# Patient Record
Sex: Male | Born: 1940
Health system: Southern US, Community
[De-identification: ages and names within clinical notes are randomized; demographics above are authoritative.]

## PROBLEM LIST (undated history)

## (undated) DIAGNOSIS — F79 Unspecified intellectual disabilities: Secondary | ICD-10-CM

## (undated) DIAGNOSIS — E669 Obesity, unspecified: Secondary | ICD-10-CM

## (undated) DIAGNOSIS — G40909 Epilepsy, unspecified, not intractable, without status epilepticus: Secondary | ICD-10-CM

## (undated) DIAGNOSIS — I10 Essential (primary) hypertension: Secondary | ICD-10-CM

## (undated) DIAGNOSIS — F39 Unspecified mood [affective] disorder: Secondary | ICD-10-CM

## (undated) DIAGNOSIS — F71 Moderate intellectual disabilities: Secondary | ICD-10-CM

## (undated) DIAGNOSIS — N189 Chronic kidney disease, unspecified: Secondary | ICD-10-CM

## (undated) DIAGNOSIS — G20C Parkinsonism, unspecified: Secondary | ICD-10-CM

## (undated) DIAGNOSIS — E7800 Pure hypercholesterolemia, unspecified: Secondary | ICD-10-CM

## (undated) HISTORY — DX: Chronic kidney disease, unspecified: N18.9

## (undated) HISTORY — DX: Parkinsonism, unspecified: G20.C

## (undated) HISTORY — DX: Moderate intellectual disabilities: F71

## (undated) HISTORY — DX: Epilepsy, unspecified, not intractable, without status epilepticus: G40.909

---

## 2000-10-22 ENCOUNTER — Emergency Department (HOSPITAL_COMMUNITY): Admission: EM | Admit: 2000-10-22 | Discharge: 2000-10-22 | Payer: Self-pay | Admitting: Emergency Medicine

## 2001-02-10 ENCOUNTER — Emergency Department (HOSPITAL_COMMUNITY): Admission: EM | Admit: 2001-02-10 | Discharge: 2001-02-10 | Payer: Self-pay | Admitting: Internal Medicine

## 2009-02-11 ENCOUNTER — Emergency Department (HOSPITAL_COMMUNITY): Admission: EM | Admit: 2009-02-11 | Discharge: 2009-02-11 | Payer: Self-pay | Admitting: Emergency Medicine

## 2010-07-24 LAB — CBC
MCV: 89.7 fL (ref 78.0–100.0)
RBC: 5.14 MIL/uL (ref 4.22–5.81)
WBC: 12.7 10*3/uL — ABNORMAL HIGH (ref 4.0–10.5)

## 2010-07-24 LAB — BASIC METABOLIC PANEL
Chloride: 104 mEq/L (ref 96–112)
Creatinine, Ser: 1.51 mg/dL — ABNORMAL HIGH (ref 0.4–1.5)
GFR calc Af Amer: 56 mL/min — ABNORMAL LOW (ref >60)
Potassium: 3.7 mEq/L (ref 3.5–5.1)
Sodium: 135 mEq/L (ref 135–145)

## 2010-07-24 LAB — DIFFERENTIAL
Eosinophils Absolute: 0 10*3/uL (ref 0.0–0.7)
Lymphs Abs: 1.3 10*3/uL (ref 0.7–4.0)
Monocytes Relative: 5 % (ref 3–12)
Neutrophils Relative %: 85 % — ABNORMAL HIGH (ref 43–77)

## 2011-07-17 ENCOUNTER — Emergency Department (HOSPITAL_COMMUNITY): Payer: Medicare Other

## 2011-07-17 ENCOUNTER — Emergency Department (HOSPITAL_COMMUNITY)
Admission: EM | Admit: 2011-07-17 | Discharge: 2011-07-17 | Disposition: A | Discharge status: Home / Self Care | Payer: Medicare Other | Attending: Emergency Medicine | Admitting: Emergency Medicine

## 2011-07-17 ENCOUNTER — Encounter (HOSPITAL_COMMUNITY): Payer: Self-pay | Admitting: *Deleted

## 2011-07-17 DIAGNOSIS — F17200 Nicotine dependence, unspecified, uncomplicated: Secondary | ICD-10-CM | POA: Insufficient documentation

## 2011-07-17 DIAGNOSIS — I10 Essential (primary) hypertension: Secondary | ICD-10-CM | POA: Insufficient documentation

## 2011-07-17 DIAGNOSIS — Y92009 Unspecified place in unspecified non-institutional (private) residence as the place of occurrence of the external cause: Secondary | ICD-10-CM | POA: Insufficient documentation

## 2011-07-17 DIAGNOSIS — R609 Edema, unspecified: Secondary | ICD-10-CM | POA: Insufficient documentation

## 2011-07-17 DIAGNOSIS — M79609 Pain in unspecified limb: Secondary | ICD-10-CM | POA: Insufficient documentation

## 2011-07-17 DIAGNOSIS — L03116 Cellulitis of left lower limb: Secondary | ICD-10-CM

## 2011-07-17 DIAGNOSIS — F79 Unspecified intellectual disabilities: Secondary | ICD-10-CM | POA: Insufficient documentation

## 2011-07-17 DIAGNOSIS — W19XXXA Unspecified fall, initial encounter: Secondary | ICD-10-CM | POA: Insufficient documentation

## 2011-07-17 DIAGNOSIS — L03119 Cellulitis of unspecified part of limb: Secondary | ICD-10-CM | POA: Insufficient documentation

## 2011-07-17 DIAGNOSIS — L02619 Cutaneous abscess of unspecified foot: Secondary | ICD-10-CM | POA: Insufficient documentation

## 2011-07-17 HISTORY — DX: Unspecified mood (affective) disorder: F39

## 2011-07-17 HISTORY — DX: Obesity, unspecified: E66.9

## 2011-07-17 HISTORY — DX: Pure hypercholesterolemia, unspecified: E78.00

## 2011-07-17 HISTORY — DX: Essential (primary) hypertension: I10

## 2011-07-17 HISTORY — DX: Unspecified intellectual disabilities: F79

## 2011-07-17 MED ORDER — SULFAMETHOXAZOLE-TMP DS 800-160 MG PO TABS
1.0000 | ORAL_TABLET | Freq: Once | ORAL | Status: AC
  Administered 2011-07-17: 1 via ORAL
  Filled 2011-07-17: qty 1

## 2011-07-17 MED ORDER — NAPROXEN 250 MG PO TABS
500.0000 mg | ORAL_TABLET | Freq: Once | ORAL | Status: AC
  Administered 2011-07-17: 500 mg via ORAL
  Filled 2011-07-17: qty 2

## 2011-07-17 MED ORDER — NAPROXEN 500 MG PO TABS: 500.0000 mg | ORAL_TABLET | Freq: Two times a day (BID) | ORAL | Status: AC

## 2011-07-17 MED ORDER — SULFAMETHOXAZOLE-TRIMETHOPRIM 800-160 MG PO TABS: 1.0000 | ORAL_TABLET | Freq: Two times a day (BID) | ORAL | Status: AC

## 2011-07-17 NOTE — Discharge Instructions (Signed)
Elevate foot. Keep clean. Antibiotic and pain medicine. Followup your primary care Dr.

## 2011-07-17 NOTE — ED Notes (Signed)
Swelling and redness to left foot first noticed this morning.

## 2011-07-17 NOTE — ED Provider Notes (Signed)
This chart was scribed for Donnetta Hutching, MD by Williemae Natter. The patient was seen in room [facility] at 11:05 AM.  CSN: [id number]  Arrival date & time 07/17/11  4098   First MD Initiated Contact with Patient 07/17/11 1020      Chief Complaint  Patient presents with  . Leg Swelling    (Consider location/radiation/quality/duration/timing/severity/associated sxs/prior treatment) HPI Level 5 Caveat due to pt's mental redardation Michael Mercado is a 71 y.o. male who presents to the Emergency Department complaining of constant acute onset mild foot pain on the left foot since yesterday. Pt reports that left foot is  sore. Pt's wife reports that he fell and hit his head yesterday while taking out the trash. She suspects that his foot was injured in the fall.  Past Medical History  Diagnosis Date  . MR (mental retardation)   . Mood disorder   . Hypercholesteremia   . Hypertension   . Obesity     History reviewed. No pertinent past surgical history.  No family history on file.  History  Substance Use Topics  . Smoking status: Current Everyday Smoker  . Smokeless tobacco: Not on file  . Alcohol Use: No      Review of Systems  Unable to perform ROS: Other    Allergies  Review of patient's allergies indicates no known allergies.  Home Medications  No current outpatient prescriptions on file.  BP 139/82  Pulse 73  Temp 98.4 F (36.9 C)  Resp 16  SpO2 97%  Physical Exam  Nursing note and vitals reviewed. Constitutional: He appears well-developed and well-nourished.  HENT:  Head: Normocephalic and atraumatic.  Neck: Normal range of motion. Neck supple.  Cardiovascular: Normal rate.   Pulmonary/Chest: Effort normal. No respiratory distress.  Abdominal: There is no tenderness.  Musculoskeletal: He exhibits edema and tenderness.       left foot erythematous over 3rd 4th and 5th MTP area  Neurological: He is alert. He exhibits normal muscle tone.  Skin: Skin  is warm and dry.    ED Course  Procedures (including critical care time) DIAGNOSTIC STUDIES: Oxygen Saturation is 97% on room air, normal by my terpretation.   Dg Foot Complete Left  07/17/2011  *RADIOLOGY REPORT*  Clinical Data: Left foot pain, swelling, erythema.  LEFT FOOT - COMPLETE 3+ VIEW  Comparison: None.  Findings: No evidence of fracture or dislocation.  No evidence of arthropathy.  No other bone lesions identified.  Tiny plantar and dorsal calcaneal spurs are noted.  Soft tissues are unremarkable.  IMPRESSION:  1.  No acute findings. 2.  Tiny dorsal and plantar calcaneal spurs incidentally noted.  Original Report Authenticated By: Danae Orleans, M.D.   COORDINATION OF CARE: Medications - No data to display   Suspect minor skin infection but also possibility of gout. Ordering X-ray of left foot and treating for both possibilities. Discussed treatment plan with pt and family. Putting pt on an antibiotic with an antiinflammatory agent.  Labs Reviewed - No data to display No results found.   No diagnosis found.    MDM    I personally performed the services described in this documentation, which was scribed in my presence. The recorded information has been reviewed and considered.   X-ray of foot negative.  Skin exam shows cellulitis on dorsum of foot. Additionally clinical scenario could be consistent with gout secondary to involvement of first MTP joint. Will Rx Septra and Naprosyn.     Donnetta Hutching, MD 07/17/11  1205 

## 2011-07-17 NOTE — ED Notes (Signed)
Patient with no complaints at this time. Respirations even and unlabored. Skin warm/dry. Discharge instructions reviewed with patient at this time. Patient given opportunity to voice concerns/ask questions. Patient discharged at this time and left Emergency Department with steady gait.   

## 2011-07-17 NOTE — ED Notes (Signed)
Dr Cook at bedside

## 2011-07-17 NOTE — ED Notes (Signed)
Patient ambulatory to restroom with steady gait.

## 2011-07-17 NOTE — ED Notes (Signed)
Patient from [facility]. Caregiver from home at bedside.

## 2014-05-31 DIAGNOSIS — F319 Bipolar disorder, unspecified: Secondary | ICD-10-CM | POA: Diagnosis not present

## 2014-05-31 DIAGNOSIS — F71 Moderate intellectual disabilities: Secondary | ICD-10-CM | POA: Diagnosis not present

## 2014-06-28 DIAGNOSIS — F319 Bipolar disorder, unspecified: Secondary | ICD-10-CM | POA: Diagnosis not present

## 2014-06-28 DIAGNOSIS — F71 Moderate intellectual disabilities: Secondary | ICD-10-CM | POA: Diagnosis not present

## 2014-07-04 DIAGNOSIS — Z79899 Other long term (current) drug therapy: Secondary | ICD-10-CM | POA: Diagnosis not present

## 2014-07-19 DIAGNOSIS — W19XXXA Unspecified fall, initial encounter: Secondary | ICD-10-CM | POA: Diagnosis not present

## 2014-07-19 DIAGNOSIS — M85641 Other cyst of bone, right hand: Secondary | ICD-10-CM | POA: Diagnosis not present

## 2014-07-19 DIAGNOSIS — S0181XA Laceration without foreign body of other part of head, initial encounter: Secondary | ICD-10-CM | POA: Diagnosis not present

## 2014-07-19 DIAGNOSIS — M5032 Other cervical disc degeneration, mid-cervical region: Secondary | ICD-10-CM | POA: Diagnosis not present

## 2014-07-19 DIAGNOSIS — M19022 Primary osteoarthritis, left elbow: Secondary | ICD-10-CM | POA: Diagnosis not present

## 2014-07-19 DIAGNOSIS — S51012A Laceration without foreign body of left elbow, initial encounter: Secondary | ICD-10-CM | POA: Diagnosis not present

## 2014-07-19 DIAGNOSIS — S0990XA Unspecified injury of head, initial encounter: Secondary | ICD-10-CM | POA: Diagnosis not present

## 2014-07-19 DIAGNOSIS — S60511A Abrasion of right hand, initial encounter: Secondary | ICD-10-CM | POA: Diagnosis not present

## 2014-07-19 DIAGNOSIS — S199XXA Unspecified injury of neck, initial encounter: Secondary | ICD-10-CM | POA: Diagnosis not present

## 2014-07-19 DIAGNOSIS — S50311A Abrasion of right elbow, initial encounter: Secondary | ICD-10-CM | POA: Diagnosis not present

## 2014-07-19 DIAGNOSIS — S0001XA Abrasion of scalp, initial encounter: Secondary | ICD-10-CM | POA: Diagnosis not present

## 2014-07-19 DIAGNOSIS — G319 Degenerative disease of nervous system, unspecified: Secondary | ICD-10-CM | POA: Diagnosis not present

## 2014-07-19 DIAGNOSIS — Z720 Tobacco use: Secondary | ICD-10-CM | POA: Diagnosis not present

## 2014-07-19 DIAGNOSIS — S61411A Laceration without foreign body of right hand, initial encounter: Secondary | ICD-10-CM | POA: Diagnosis not present

## 2014-07-19 DIAGNOSIS — I10 Essential (primary) hypertension: Secondary | ICD-10-CM | POA: Diagnosis not present

## 2014-07-19 DIAGNOSIS — S60512A Abrasion of left hand, initial encounter: Secondary | ICD-10-CM | POA: Diagnosis not present

## 2014-08-28 DIAGNOSIS — R7309 Other abnormal glucose: Secondary | ICD-10-CM | POA: Diagnosis not present

## 2014-08-28 DIAGNOSIS — E782 Mixed hyperlipidemia: Secondary | ICD-10-CM | POA: Diagnosis not present

## 2014-08-28 DIAGNOSIS — E780 Pure hypercholesterolemia: Secondary | ICD-10-CM | POA: Diagnosis not present

## 2014-08-28 DIAGNOSIS — Z833 Family history of diabetes mellitus: Secondary | ICD-10-CM | POA: Diagnosis not present

## 2014-08-28 DIAGNOSIS — R531 Weakness: Secondary | ICD-10-CM | POA: Diagnosis not present

## 2014-08-28 DIAGNOSIS — R5381 Other malaise: Secondary | ICD-10-CM | POA: Diagnosis not present

## 2014-08-28 DIAGNOSIS — Z79899 Other long term (current) drug therapy: Secondary | ICD-10-CM | POA: Diagnosis not present

## 2014-08-28 DIAGNOSIS — J449 Chronic obstructive pulmonary disease, unspecified: Secondary | ICD-10-CM | POA: Diagnosis not present

## 2014-08-28 DIAGNOSIS — I10 Essential (primary) hypertension: Secondary | ICD-10-CM | POA: Diagnosis not present

## 2014-08-29 DIAGNOSIS — F319 Bipolar disorder, unspecified: Secondary | ICD-10-CM | POA: Diagnosis not present

## 2014-08-29 DIAGNOSIS — F71 Moderate intellectual disabilities: Secondary | ICD-10-CM | POA: Diagnosis not present

## 2014-10-01 DIAGNOSIS — F319 Bipolar disorder, unspecified: Secondary | ICD-10-CM | POA: Diagnosis not present

## 2014-10-31 DIAGNOSIS — F70 Mild intellectual disabilities: Secondary | ICD-10-CM | POA: Diagnosis not present

## 2014-10-31 DIAGNOSIS — F319 Bipolar disorder, unspecified: Secondary | ICD-10-CM | POA: Diagnosis not present

## 2014-11-07 DIAGNOSIS — H40033 Anatomical narrow angle, bilateral: Secondary | ICD-10-CM | POA: Diagnosis not present

## 2014-11-07 DIAGNOSIS — H2513 Age-related nuclear cataract, bilateral: Secondary | ICD-10-CM | POA: Diagnosis not present

## 2014-11-27 DIAGNOSIS — Z79899 Other long term (current) drug therapy: Secondary | ICD-10-CM | POA: Diagnosis not present

## 2014-11-27 DIAGNOSIS — J449 Chronic obstructive pulmonary disease, unspecified: Secondary | ICD-10-CM | POA: Diagnosis not present

## 2014-12-03 DIAGNOSIS — F319 Bipolar disorder, unspecified: Secondary | ICD-10-CM | POA: Diagnosis not present

## 2014-12-10 DIAGNOSIS — F319 Bipolar disorder, unspecified: Secondary | ICD-10-CM | POA: Diagnosis not present

## 2014-12-31 DIAGNOSIS — F319 Bipolar disorder, unspecified: Secondary | ICD-10-CM | POA: Diagnosis not present

## 2015-01-24 DIAGNOSIS — F319 Bipolar disorder, unspecified: Secondary | ICD-10-CM | POA: Diagnosis not present

## 2015-01-28 DIAGNOSIS — Z23 Encounter for immunization: Secondary | ICD-10-CM | POA: Diagnosis not present

## 2015-01-29 DIAGNOSIS — J449 Chronic obstructive pulmonary disease, unspecified: Secondary | ICD-10-CM | POA: Diagnosis not present

## 2015-01-29 DIAGNOSIS — I10 Essential (primary) hypertension: Secondary | ICD-10-CM | POA: Diagnosis not present

## 2015-01-29 DIAGNOSIS — R531 Weakness: Secondary | ICD-10-CM | POA: Diagnosis not present

## 2015-02-27 DIAGNOSIS — F319 Bipolar disorder, unspecified: Secondary | ICD-10-CM | POA: Diagnosis not present

## 2015-02-27 DIAGNOSIS — F70 Mild intellectual disabilities: Secondary | ICD-10-CM | POA: Diagnosis not present

## 2015-03-18 ENCOUNTER — Ambulatory Visit (INDEPENDENT_AMBULATORY_CARE_PROVIDER_SITE_OTHER): Payer: Medicare Other | Admitting: Family Medicine

## 2015-03-18 ENCOUNTER — Encounter: Payer: Self-pay | Admitting: Family Medicine

## 2015-03-18 VITALS — BP 117/76 | HR 66 | Temp 96.7°F | Ht 63.0 in | Wt 188.6 lb

## 2015-03-18 DIAGNOSIS — I10 Essential (primary) hypertension: Secondary | ICD-10-CM | POA: Diagnosis not present

## 2015-03-18 DIAGNOSIS — F79 Unspecified intellectual disabilities: Secondary | ICD-10-CM | POA: Insufficient documentation

## 2015-03-18 DIAGNOSIS — R238 Other skin changes: Secondary | ICD-10-CM | POA: Diagnosis not present

## 2015-03-18 DIAGNOSIS — E785 Hyperlipidemia, unspecified: Secondary | ICD-10-CM | POA: Diagnosis not present

## 2015-03-18 DIAGNOSIS — J449 Chronic obstructive pulmonary disease, unspecified: Secondary | ICD-10-CM

## 2015-03-18 DIAGNOSIS — IMO0001 Reserved for inherently not codable concepts without codable children: Secondary | ICD-10-CM

## 2015-03-18 DIAGNOSIS — F209 Schizophrenia, unspecified: Secondary | ICD-10-CM | POA: Insufficient documentation

## 2015-03-18 DIAGNOSIS — R233 Spontaneous ecchymoses: Secondary | ICD-10-CM

## 2015-03-18 MED ORDER — ALBUTEROL SULFATE HFA 108 (90 BASE) MCG/ACT IN AERS: 2.0000 | INHALATION_SPRAY | Freq: Four times a day (QID) | RESPIRATORY_TRACT | Status: DC | PRN

## 2015-03-18 NOTE — Progress Notes (Signed)
BP 117/76 mmHg  Pulse 66  Temp(Src) 96.7 F (35.9 C) (Oral)  Ht 5' 3"  (1.6 m)  Wt 188 lb 9.6 oz (85.548 kg)  BMI 33.42 kg/m2   Subjective:    Patient ID: Michael Mercado, male    DOB: [birth year], 74 y.o.   MRN: [id number]  HPI: Michael Mercado is a 74 y.o. male presenting on 03/18/2015 for Establish Care   HPI Hypertension  Patient has had hypertension for at least a few years and is currently taking Norvasc 5 mg daily for this. Patient denies headaches, blurred vision, chest pains, shortness of breath, or weakness. Denies any side effects from medication and is content with current medication. He comes in today to establish care and his caretakers come in with him as well today. She is caretaker because of his mental retardation and severe schizophrenia.  Hyperlipidemia Patient carries the diagnosis of high cholesterol from a previous physician. He is currently on gemfibrozil for this. Denies any issues with the medication and is due for recheck.  COPD Patient has been diagnosed with COPD because of a smoking history. He currently has an albuterol inhaler but has it listed as scheduled taking it 4 times daily 2 puffs each time. They do not actually know how often he has taken based on stress or anything else at this point. They've not noticed any coughing spells or wheezing or difficulty breathing.  Easy bruising Patient has some bruising on anterior forearms and dorsal side of his wrists and hands. Her explanation he tends to pick at his skin and a lot of in those areas often and even had scarring on his forearm as a result of that.  Relevant past medical, surgical, family and social history reviewed and updated as indicated. Interim medical history since our last visit reviewed. Allergies and medications reviewed and updated.  Review of Systems  Constitutional: Negative for fever.  HENT: Negative for ear discharge and ear pain.   Eyes: Negative for discharge and visual  disturbance.  Respiratory: Negative for shortness of breath and wheezing.   Cardiovascular: Negative for chest pain and leg swelling.  Gastrointestinal: Negative for abdominal pain, diarrhea and constipation.  Genitourinary: Negative for difficulty urinating.  Musculoskeletal: Negative for back pain and gait problem.  Skin: Positive for color change (some bruising in various stages of healing on forearms). Negative for rash.  Neurological: Negative for syncope, light-headedness and headaches.  All other systems reviewed and are negative.   Per HPI unless specifically indicated above  Social History   Social History  . Marital Status: Single    Spouse Name: N/A  . Number of Children: N/A  . Years of Education: N/A   Occupational History  . Not on file.   Social History Main Topics  . Smoking status: Current Every Day Smoker -- 0.33 packs/day    Types: Cigarettes  . Smokeless tobacco: Not on file  . Alcohol Use: No  . Drug Use: Not on file  . Sexual Activity: Not on file   Other Topics Concern  . Not on file   Social History Narrative    History reviewed. No pertinent past surgical history.  Family History  Problem Relation Age of Onset  . Stroke Sister       Medication List       This list is accurate as of: 03/18/15 11:17 AM.  Always use your most recent med list.  acetaminophen 500 MG tablet  Commonly known as:  TYLENOL  Take 500 mg by mouth every 4 (four) hours as needed.     AFTER BITE EX  Apply topically as needed.     albuterol 108 (90 BASE) MCG/ACT inhaler  Commonly known as:  PROVENTIL HFA;VENTOLIN HFA  Inhale 2 puffs into the lungs every 6 (six) hours as needed for wheezing or shortness of breath.     alum & mag hydroxide-simeth 200-200-20 MG/5ML suspension  Commonly known as:  MAALOX/MYLANTA  Take 10 mLs by mouth every 8 (eight) hours as needed for indigestion or heartburn.     amLODipine 5 MG tablet  Commonly known as:   NORVASC  Take 5 mg by mouth daily.     aspirin EC 81 MG tablet  Take 81 mg by mouth daily.     clonazePAM 0.5 MG tablet  Commonly known as:  KLONOPIN  Take 0.5 mg by mouth 2 (two) times daily as needed for anxiety.     COPPERTONE SPORT SPF30 EX  Apply topically.     diphenhydrAMINE 25 mg capsule  Commonly known as:  BENADRYL  Take 25 mg by mouth every 6 (six) hours as needed.     gemfibrozil 600 MG tablet  Commonly known as:  LOPID  Take 600 mg by mouth 2 (two) times daily.     hydrocortisone cream 1 %  Apply 1 application topically 3 (three) times daily. As needed     ibuprofen 400 MG tablet  Commonly known as:  ADVIL,MOTRIN  Take 400 mg by mouth every 8 (eight) hours as needed.     LIP BALM BASE EX  Apply topically.     loperamide 2 MG capsule  Commonly known as:  IMODIUM  Take by mouth as needed for diarrhea or loose stools.     LUBRICATING LOTION EX  Apply topically 2 (two) times daily.     magnesium hydroxide 400 MG/5ML suspension  Commonly known as:  MILK OF MAGNESIA  Take 30 mLs by mouth daily as needed for mild constipation.     PARoxetine 20 MG tablet  Commonly known as:  PAXIL  Take 20 mg by mouth daily. Take at bedtime     permethrin 5 % cream  Commonly known as:  ELIMITE  Apply 1 application topically once. As needed     risperiDONE 1 MG tablet  Commonly known as:  RISPERDAL  Take 1 mg by mouth 2 (two) times daily.           Objective:    BP 117/76 mmHg  Pulse 66  Temp(Src) 96.7 F (35.9 C) (Oral)  Ht 5' 3"  (1.6 m)  Wt 188 lb 9.6 oz (85.548 kg)  BMI 33.42 kg/m2  Wt Readings from Last 3 Encounters:  03/18/15 188 lb 9.6 oz (85.548 kg)    Physical Exam  Constitutional: He appears well-developed and well-nourished. No distress.  HENT:  Right Ear: External ear normal.  Left Ear: External ear normal.  Nose: Nose normal.  Mouth/Throat: Oropharynx is clear and moist. No oropharyngeal exudate.  Eyes: Conjunctivae and EOM are normal.  Pupils are equal, round, and reactive to light. Right eye exhibits no discharge. No scleral icterus.  Neck: Neck supple. No thyromegaly present.  Cardiovascular: Normal rate, regular rhythm, normal heart sounds and intact distal pulses.   No murmur heard. Pulmonary/Chest: Effort normal and breath sounds normal. No respiratory distress. He has no wheezes. He has no rales.  Abdominal: Soft. Bowel sounds are  normal. He exhibits no distension. There is no tenderness. There is no rebound and no guarding.  Musculoskeletal: Normal range of motion. He exhibits no edema or tenderness.  Lymphadenopathy:    He has no cervical adenopathy.  Neurological: He is alert. No cranial nerve deficit. Coordination normal.  Skin: Skin is warm and dry. Bruising (Anterior forearms and dorsal wristsand hands) noted. No rash noted. He is not diaphoretic.  Psychiatric: His speech is normal and behavior is normal. Thought content normal. His affect is labile. He expresses impulsivity. He does not express inappropriate judgment. He expresses no suicidal ideation. He expresses no suicidal plans.  Vitals reviewed.   Results for orders placed or performed during the hospital encounter of 07/08/20  Basic metabolic panel  Result Value Ref Range   Sodium 135 135 - 145 mEq/L   Potassium 3.7 3.5 - 5.1 mEq/L   Chloride 104 96 - 112 mEq/L   CO2 21 19 - 32 mEq/L   Glucose, Bld 123 (H) 70 - 99 mg/dL   BUN 23 6 - 23 mg/dL   Creatinine, Ser 1.51 (H) 0.4 - 1.5 mg/dL   Calcium 9.5 8.4 - 10.5 mg/dL   GFR calc non Af Amer 46 (L) >60 mL/min   GFR calc Af Amer (L) >60 mL/min    56        The eGFR has been calculated using the MDRD equation. This calculation has not been validated in all clinical situations. eGFR's persistently <60 mL/min signify possible Chronic Kidney Disease.  CBC  Result Value Ref Range   WBC 12.7 (H) 4.0 - 10.5 K/uL   RBC 5.14 4.22 - 5.81 MIL/uL   Hemoglobin 15.9 13.0 - 17.0 g/dL   HCT 46.1 39.0 - 52.0  %   MCV 89.7 78.0 - 100.0 fL   MCHC 34.5 30.0 - 36.0 g/dL   RDW 13.7 11.5 - 15.5 %   Platelets 183 150 - 400 K/uL  Differential  Result Value Ref Range   Neutrophils Relative % 85 (H) 43 - 77 %   Neutro Abs 10.8 (H) 1.7 - 7.7 K/uL   Lymphocytes Relative 10 (L) 12 - 46 %   Lymphs Abs 1.3 0.7 - 4.0 K/uL   Monocytes Relative 5 3 - 12 %   Monocytes Absolute 0.6 0.1 - 1.0 K/uL   Eosinophils Relative 0 0 - 5 %   Eosinophils Absolute 0.0 0.0 - 0.7 K/uL   Basophils Relative 0 0 - 1 %   Basophils Absolute 0.0 0.0 - 0.1 K/uL      Assessment & Plan:   Problem List Items Addressed This Visit      Cardiovascular and Mediastinum   HTN (hypertension), benign - Primary    Currently on Norvasc, blood pressure controlled today. Continue medication and check labs      Relevant Medications   amLODipine (NORVASC) 5 MG tablet   Other Relevant Orders   CMP14+EGFR   TSH     Respiratory   COPD bronchitis    Using daily albuterol, because it has been scheduled, will change to when necessary. We will then assess how often he needs it and see whether he needs a maintenance inhaler after that point.      Relevant Medications   diphenhydrAMINE (BENADRYL) 25 mg capsule   albuterol (PROVENTIL HFA;VENTOLIN HFA) 108 (90 BASE) MCG/ACT inhaler     Other   Hyperlipidemia LDL goal <130    Currently on gemfibrozil, we'll check labs and consider whether we need  to change that.      Relevant Medications   amLODipine (NORVASC) 5 MG tablet   Other Relevant Orders   Lipid panel    Other Visit Diagnoses    Easy bruising        He has easy bruising on arms and hands but he also picks at them a lot, will check CBC and platelets    Relevant Orders    CBC with Differential/Platelet        Follow up plan: Return in about 3 months (around 06/18/2015), or if symptoms worsen or fail to improve, for f/u COPD and HTN.  Caryl Pina, MD [facility] 03/18/2015, 11:17  AM

## 2015-03-18 NOTE — Assessment & Plan Note (Signed)
Currently on gemfibrozil, we'll check labs and consider whether we need to change that.

## 2015-03-18 NOTE — Assessment & Plan Note (Signed)
Currently on Norvasc, blood pressure controlled today. Continue medication and check labs

## 2015-03-18 NOTE — Assessment & Plan Note (Signed)
Using daily albuterol, because it has been scheduled, will change to when necessary. We will then assess how often he needs it and see whether he needs a maintenance inhaler after that point.

## 2015-03-19 LAB — LIPID PANEL
CHOL/HDL RATIO: 3.3 ratio (ref 0.0–5.0)
Cholesterol, Total: 147 mg/dL (ref 100–199)
HDL: 45 mg/dL (ref >39)
LDL Calculated: 91 mg/dL (ref 0–99)
Triglycerides: 57 mg/dL (ref 0–149)
VLDL Cholesterol Cal: 11 mg/dL (ref 5–40)

## 2015-03-19 LAB — CMP14+EGFR
A/G RATIO: 1.8 (ref 1.1–2.5)
ALT: 16 IU/L (ref 0–44)
AST: 21 IU/L (ref 0–40)
Albumin: 4.4 g/dL (ref 3.5–4.8)
Alkaline Phosphatase: 97 IU/L (ref 39–117)
BUN/Creatinine Ratio: 17 (ref 10–22)
BUN: 23 mg/dL (ref 8–27)
Bilirubin Total: 0.4 mg/dL (ref 0.0–1.2)
CALCIUM: 9.6 mg/dL (ref 8.6–10.2)
CO2: 17 mmol/L — ABNORMAL LOW (ref 18–29)
Chloride: 102 mmol/L (ref 97–106)
Creatinine, Ser: 1.36 mg/dL — ABNORMAL HIGH (ref 0.76–1.27)
GFR, EST AFRICAN AMERICAN: 60 mL/min/{1.73_m2} (ref >59)
GFR, EST NON AFRICAN AMERICAN: 52 mL/min/{1.73_m2} — AB (ref >59)
GLOBULIN, TOTAL: 2.4 g/dL (ref 1.5–4.5)
Glucose: 91 mg/dL (ref 65–99)
POTASSIUM: 4.9 mmol/L (ref 3.5–5.2)
SODIUM: 138 mmol/L (ref 136–144)
TOTAL PROTEIN: 6.8 g/dL (ref 6.0–8.5)

## 2015-03-19 LAB — CBC WITH DIFFERENTIAL/PLATELET
BASOS ABS: 0.1 10*3/uL (ref 0.0–0.2)
Basos: 1 %
EOS (ABSOLUTE): 0.3 10*3/uL (ref 0.0–0.4)
Eos: 5 %
Hematocrit: 44.9 % (ref 37.5–51.0)
Hemoglobin: 15.5 g/dL (ref 12.6–17.7)
IMMATURE GRANS (ABS): 0 10*3/uL (ref 0.0–0.1)
IMMATURE GRANULOCYTES: 0 %
LYMPHS: 26 %
Lymphocytes Absolute: 1.7 10*3/uL (ref 0.7–3.1)
MCH: 30.3 pg (ref 26.6–33.0)
MCHC: 34.5 g/dL (ref 31.5–35.7)
MCV: 88 fL (ref 79–97)
Monocytes Absolute: 0.5 10*3/uL (ref 0.1–0.9)
Monocytes: 8 %
NEUTROS PCT: 60 %
Neutrophils Absolute: 3.9 10*3/uL (ref 1.4–7.0)
PLATELETS: 243 10*3/uL (ref 150–379)
RBC: 5.12 x10E6/uL (ref 4.14–5.80)
RDW: 14.4 % (ref 12.3–15.4)
WBC: 6.5 10*3/uL (ref 3.4–10.8)

## 2015-03-19 LAB — TSH: TSH: 2.37 u[IU]/mL (ref 0.450–4.500)

## 2015-05-03 ENCOUNTER — Telehealth: Payer: Self-pay | Admitting: Family Medicine

## 2015-05-03 NOTE — Telephone Encounter (Signed)
This call is from [facility] - pt in a group home..  Please address

## 2015-05-03 NOTE — Telephone Encounter (Signed)
I just got a paper for that and signed it today and sent it back to [facility], but you're okay to call one in if we need to

## 2015-05-03 NOTE — Telephone Encounter (Signed)
I faxed form and recalled [facility] and they received fax.

## 2015-05-16 ENCOUNTER — Other Ambulatory Visit: Payer: Self-pay | Admitting: *Deleted

## 2015-05-16 MED ORDER — GEMFIBROZIL 600 MG PO TABS: 600.0000 mg | ORAL_TABLET | Freq: Two times a day (BID) | ORAL | Status: DC

## 2015-05-16 MED ORDER — ASPIRIN EC 81 MG PO TBEC: 81.0000 mg | DELAYED_RELEASE_TABLET | Freq: Every day | ORAL | Status: DC

## 2015-05-29 DIAGNOSIS — F319 Bipolar disorder, unspecified: Secondary | ICD-10-CM | POA: Diagnosis not present

## 2015-06-25 ENCOUNTER — Ambulatory Visit (INDEPENDENT_AMBULATORY_CARE_PROVIDER_SITE_OTHER): Payer: Medicare Other | Admitting: Family Medicine

## 2015-06-25 ENCOUNTER — Ambulatory Visit: Payer: Medicare Other | Admitting: Family Medicine

## 2015-06-25 ENCOUNTER — Encounter: Payer: Self-pay | Admitting: Family Medicine

## 2015-06-25 VITALS — BP 123/75 | HR 69 | Temp 97.6°F | Ht 63.0 in | Wt 191.2 lb

## 2015-06-25 DIAGNOSIS — I10 Essential (primary) hypertension: Secondary | ICD-10-CM | POA: Diagnosis not present

## 2015-06-25 DIAGNOSIS — J449 Chronic obstructive pulmonary disease, unspecified: Secondary | ICD-10-CM

## 2015-06-25 DIAGNOSIS — IMO0001 Reserved for inherently not codable concepts without codable children: Secondary | ICD-10-CM

## 2015-06-25 NOTE — Progress Notes (Signed)
BP 123/75 mmHg  Pulse 69  Temp(Src) 97.6 F (36.4 C) (Oral)  Ht _0  (1.6 m)  Wt 191 lb 3.2 oz (86.728 kg)  BMI 33.88 kg/m2   Subjective:    Patient ID: Michael Mercado, male    DOB: 24-Jan-1941, 75 y.o.   MRN: [id number]  HPI: RIYANSH Mercado is a 75 y.o. male presenting on 06/25/2015 for Follow-up and Hypertension   HPI Hypertension recheck Patient is coming in today for hypertension recheck he is currently on amlodipine and his blood pressure is 123/75. Patient denies headaches, blurred vision, chest pains, shortness of breath, or weakness. Denies any side effects from medication and is content with current medication.   COPD Patient is coming today for a CBC check. His breathing has been well controlled and he has been regularly using his albuterol inhaler about once per month. He denies any regular nighttime symptoms. He denies any fevers or chills.  Relevant past medical, surgical, family and social history reviewed and updated as indicated. Interim medical history since our last visit reviewed. Allergies and medications reviewed and updated.  Review of Systems  Constitutional: Negative for fever and chills.  HENT: Negative for ear discharge and ear pain.   Eyes: Negative for discharge and visual disturbance.  Respiratory: Negative for cough, shortness of breath and wheezing.   Cardiovascular: Negative for chest pain and leg swelling.  Gastrointestinal: Negative for abdominal pain, diarrhea and constipation.  Genitourinary: Negative for difficulty urinating.  Musculoskeletal: Negative for back pain and gait problem.  Skin: Negative for rash.  Neurological: Negative for dizziness, syncope, light-headedness and headaches.  All other systems reviewed and are negative.   Per HPI unless specifically indicated above     Medication List       This list is accurate as of: 06/25/15  8:43 AM.  Always use your most recent med list.               acetaminophen 500  MG tablet  Commonly known as:  TYLENOL  Take 500 mg by mouth every 4 (four) hours as needed.     AFTER BITE EX  Apply topically as needed.     albuterol 108 (90 Base) MCG/ACT inhaler  Commonly known as:  PROVENTIL HFA;VENTOLIN HFA  Inhale 2 puffs into the lungs every 6 (six) hours as needed for wheezing or shortness of breath.     alum & mag hydroxide-simeth 200-200-20 MG/5ML suspension  Commonly known as:  MAALOX/MYLANTA  Take 10 mLs by mouth every 8 (eight) hours as needed for indigestion or heartburn.     amLODipine 5 MG tablet  Commonly known as:  NORVASC  Take 5 mg by mouth daily.     aspirin EC 81 MG tablet  Take 1 tablet (81 mg total) by mouth daily.     clonazePAM 0.5 MG tablet  Commonly known as:  KLONOPIN  Take 0.5 mg by mouth 2 (two) times daily as needed for anxiety.     COPPERTONE SPORT SPF30 EX  Apply topically.     diphenhydrAMINE 25 mg capsule  Commonly known as:  BENADRYL  Take 25 mg by mouth every 6 (six) hours as needed.     gemfibrozil 600 MG tablet  Commonly known as:  LOPID  Take 1 tablet (600 mg total) by mouth 2 (two) times daily.     hydrocortisone cream 1 %  Apply 1 application topically 3 (three) times daily. As needed     ibuprofen 400 MG  tablet  Commonly known as:  ADVIL,MOTRIN  Take 400 mg by mouth every 8 (eight) hours as needed.     LIP BALM BASE EX  Apply topically.     loperamide 2 MG capsule  Commonly known as:  IMODIUM  Take by mouth as needed for diarrhea or loose stools.     LUBRICATING LOTION EX  Apply topically 2 (two) times daily.     magnesium hydroxide 400 MG/5ML suspension  Commonly known as:  MILK OF MAGNESIA  Take 30 mLs by mouth daily as needed for mild constipation.     PARoxetine 20 MG tablet  Commonly known as:  PAXIL  Take 20 mg by mouth daily. Take at bedtime     permethrin 5 % cream  Commonly known as:  ELIMITE  Apply 1 application topically once. As needed     risperiDONE 1 MG tablet  Commonly  known as:  RISPERDAL  Take 1 mg by mouth 2 (two) times daily.           Objective:    BP 123/75 mmHg  Pulse 69  Temp(Src) 97.6 F (36.4 C) (Oral)  Ht _0  (1.6 m)  Wt 191 lb 3.2 oz (86.728 kg)  BMI 33.88 kg/m2  Wt Readings from Last 3 Encounters:  06/25/15 191 lb 3.2 oz (86.728 kg)  03/18/15 188 lb 9.6 oz (85.548 kg)    Physical Exam  Constitutional: He is oriented to person, place, and time. He appears well-developed and well-nourished. No distress.  Eyes: Conjunctivae and EOM are normal. Pupils are equal, round, and reactive to light. Right eye exhibits no discharge. No scleral icterus.  Neck: Neck supple. No thyromegaly present.  Cardiovascular: Normal rate, regular rhythm, normal heart sounds and intact distal pulses.   No murmur heard. Pulmonary/Chest: Effort normal and breath sounds normal. No respiratory distress. He has no wheezes.  Musculoskeletal: Normal range of motion. He exhibits no edema.  Lymphadenopathy:    He has no cervical adenopathy.  Neurological: He is alert and oriented to person, place, and time. Coordination normal.  Skin: Skin is warm and dry. No rash noted. He is not diaphoretic.  Psychiatric: He has a normal mood and affect. His behavior is normal.  Vitals reviewed.   Results for orders placed or performed in visit on 03/18/15  CMP14+EGFR  Result Value Ref Range   Glucose 91 65 - 99 mg/dL   BUN 23 8 - 27 mg/dL   Creatinine, Ser 1.36 (H) 0.76 - 1.27 mg/dL   GFR calc non Af Amer 52 (L) >59 mL/min/1.73   GFR calc Af Amer 60 >59 mL/min/1.73   BUN/Creatinine Ratio 17 10 - 22   Sodium 138 136 - 144 mmol/L   Potassium 4.9 3.5 - 5.2 mmol/L   Chloride 102 97 - 106 mmol/L   CO2 17 (L) 18 - 29 mmol/L   Calcium 9.6 8.6 - 10.2 mg/dL   Total Protein 6.8 6.0 - 8.5 g/dL   Albumin 4.4 3.5 - 4.8 g/dL   Globulin, Total 2.4 1.5 - 4.5 g/dL   Albumin/Globulin Ratio 1.8 1.1 - 2.5   Bilirubin Total 0.4 0.0 - 1.2 mg/dL   Alkaline Phosphatase 97 39 - 117 IU/L    AST 21 0 - 40 IU/L   ALT 16 0 - 44 IU/L  Lipid panel  Result Value Ref Range   Cholesterol, Total 147 100 - 199 mg/dL   Triglycerides 57 0 - 149 mg/dL   HDL 45 >39 mg/dL  VLDL Cholesterol Cal 11 5 - 40 mg/dL   LDL Calculated 91 0 - 99 mg/dL   Chol/HDL Ratio 3.3 0.0 - 5.0 ratio units  TSH  Result Value Ref Range   TSH 2.370 0.450 - 4.500 uIU/mL  CBC with Differential/Platelet  Result Value Ref Range   WBC 6.5 3.4 - 10.8 x10E3/uL   RBC 5.12 4.14 - 5.80 x10E6/uL   Hemoglobin 15.5 12.6 - 17.7 g/dL   Hematocrit 44.9 37.5 - 51.0 %   MCV 88 79 - 97 fL   MCH 30.3 26.6 - 33.0 pg   MCHC 34.5 31.5 - 35.7 g/dL   RDW 14.4 12.3 - 15.4 %   Platelets 243 150 - 379 x10E3/uL   Neutrophils 60 %   Lymphs 26 %   Monocytes 8 %   Eos 5 %   Basos 1 %   Neutrophils Absolute 3.9 1.4 - 7.0 x10E3/uL   Lymphocytes Absolute 1.7 0.7 - 3.1 x10E3/uL   Monocytes Absolute 0.5 0.1 - 0.9 x10E3/uL   EOS (ABSOLUTE) 0.3 0.0 - 0.4 x10E3/uL   Basophils Absolute 0.1 0.0 - 0.2 x10E3/uL   Immature Granulocytes 0 %   Immature Grans (Abs) 0.0 0.0 - 0.1 x10E3/uL      Assessment & Plan:   Problem List Items Addressed This Visit      Cardiovascular and Mediastinum   HTN (hypertension), benign - Primary     Respiratory   COPD bronchitis       Follow up plan: Return in about 3 months (around 09/25/2015), or if symptoms worsen or fail to improve, for Hypertension and COPD recheck.  Counseling provided for all of the vaccine components No orders of the defined types were placed in this encounter.    Caryl Pina, MD [facility] 06/25/2015, 8:43 AM

## 2015-08-28 ENCOUNTER — Other Ambulatory Visit: Payer: Self-pay | Admitting: Family Medicine

## 2015-08-28 DIAGNOSIS — F319 Bipolar disorder, unspecified: Secondary | ICD-10-CM | POA: Diagnosis not present

## 2015-09-25 ENCOUNTER — Encounter: Payer: Self-pay | Admitting: Family Medicine

## 2015-09-25 ENCOUNTER — Ambulatory Visit: Payer: Medicare Other | Admitting: Family Medicine

## 2015-09-25 ENCOUNTER — Ambulatory Visit (INDEPENDENT_AMBULATORY_CARE_PROVIDER_SITE_OTHER): Payer: Medicare Other | Admitting: Family Medicine

## 2015-09-25 VITALS — BP 129/81 | HR 54 | Temp 95.9°F | Ht 63.0 in | Wt 188.6 lb

## 2015-09-25 DIAGNOSIS — J449 Chronic obstructive pulmonary disease, unspecified: Secondary | ICD-10-CM

## 2015-09-25 DIAGNOSIS — I10 Essential (primary) hypertension: Secondary | ICD-10-CM

## 2015-09-25 DIAGNOSIS — E785 Hyperlipidemia, unspecified: Secondary | ICD-10-CM

## 2015-09-25 DIAGNOSIS — IMO0001 Reserved for inherently not codable concepts without codable children: Secondary | ICD-10-CM

## 2015-09-25 NOTE — Progress Notes (Signed)
BP 129/81 mmHg  Pulse 54  Temp(Src) 95.9 F (35.5 C) (Oral)  Ht 5' 3" (1.6 m)  Wt 188 lb 9.6 oz (85.548 kg)  BMI 33.42 kg/m2   Subjective:    Patient ID: Michael Mercado, male    DOB: 11-22-1940, 75 y.o.   MRN: [id number]  HPI: JULIOCESAR Mercado is a 75 y.o. male presenting on 09/25/2015 for Hyperlipidemia and Hypertension   HPI Hypertension Patient is coming in for a blood pressure recheck. His blood pressure today is 129/81. Patient is currently taking amlodipine 5 mg. Patient denies headaches, blurred vision, chest pains, shortness of breath, or weakness. Denies any side effects from medication and is content with current medication.   Hyperlipidemia recheck Patient is coming in today for a hyperlipidemia recheck. He is currently on gemfibrozil. He denies any issues with medication. He is not fasting today so will come back in the next few days to get his labs rechecked.  COPD recheck Patient is coming in today for a COPD recheck. They have not had to use his albuterol inhaler at all and he denies any shortness of breath or wheezing. He has been doing very well over the past 6 or 7 months. He is still smoking and has no desire to quit at this time.  Relevant past medical, surgical, family and social history reviewed and updated as indicated. Interim medical history since our last visit reviewed. Allergies and medications reviewed and updated.  Review of Systems  Constitutional: Negative for fever.  HENT: Negative for congestion, ear discharge, ear pain, postnasal drip, rhinorrhea, sinus pressure and sneezing.   Eyes: Negative for discharge and visual disturbance.  Respiratory: Negative for cough, chest tightness, shortness of breath and wheezing.   Cardiovascular: Negative for chest pain and leg swelling.  Gastrointestinal: Negative for abdominal pain, diarrhea and constipation.  Genitourinary: Negative for difficulty urinating.  Musculoskeletal: Negative for back pain  and gait problem.  Skin: Negative for rash.  Neurological: Negative for dizziness, syncope, light-headedness and headaches.  All other systems reviewed and are negative.   Per HPI unless specifically indicated above     Medication List       This list is accurate as of: 09/25/15 11:08 AM.  Always use your most recent med list.               acetaminophen 500 MG tablet  Commonly known as:  TYLENOL  Take 500 mg by mouth every 4 (four) hours as needed.     AFTER BITE EX  Apply topically as needed.     albuterol 108 (90 Base) MCG/ACT inhaler  Commonly known as:  PROVENTIL HFA;VENTOLIN HFA  Inhale 2 puffs into the lungs every 6 (six) hours as needed for wheezing or shortness of breath.     alum & mag hydroxide-simeth 200-200-20 MG/5ML suspension  Commonly known as:  MAALOX/MYLANTA  Take 10 mLs by mouth every 8 (eight) hours as needed for indigestion or heartburn. Reported on 09/25/2015     amLODipine 5 MG tablet  Commonly known as:  NORVASC  Take 5 mg by mouth daily.     ASPIRIN LOW DOSE 81 MG EC tablet  Generic drug:  aspirin  TAKE 1 TABLET BY MOUTH ONCE DAILY. **DO NOT CRUSH**     clonazePAM 0.5 MG tablet  Commonly known as:  KLONOPIN  Take 0.5 mg by mouth 2 (two) times daily as needed for anxiety.     COPPERTONE SPORT SPF30 EX  Apply topically.  diphenhydrAMINE 25 mg capsule  Commonly known as:  BENADRYL  Take 25 mg by mouth every 6 (six) hours as needed.     gemfibrozil 600 MG tablet  Commonly known as:  LOPID  TAKE 1 TABLET BY MOUTH TWICE DAILY.     hydrocortisone cream 1 %  Apply 1 application topically 3 (three) times daily. As needed     ibuprofen 400 MG tablet  Commonly known as:  ADVIL,MOTRIN  Take 400 mg by mouth every 8 (eight) hours as needed.     LIP BALM BASE EX  Apply topically.     loperamide 2 MG capsule  Commonly known as:  IMODIUM  Take by mouth as needed for diarrhea or loose stools.     LUBRICATING LOTION EX  Apply topically 2  (two) times daily.     magnesium hydroxide 400 MG/5ML suspension  Commonly known as:  MILK OF MAGNESIA  Take 30 mLs by mouth daily as needed for mild constipation.     PARoxetine 20 MG tablet  Commonly known as:  PAXIL  Take 20 mg by mouth daily. Take at bedtime     permethrin 5 % cream  Commonly known as:  ELIMITE  Apply 1 application topically once. As needed     risperiDONE 1 MG tablet  Commonly known as:  RISPERDAL  Take 1 mg by mouth 2 (two) times daily.           Objective:    BP 129/81 mmHg  Pulse 54  Temp(Src) 95.9 F (35.5 C) (Oral)  Ht 5' 3" (1.6 m)  Wt 188 lb 9.6 oz (85.548 kg)  BMI 33.42 kg/m2  Wt Readings from Last 3 Encounters:  09/25/15 188 lb 9.6 oz (85.548 kg)  06/25/15 191 lb 3.2 oz (86.728 kg)  03/18/15 188 lb 9.6 oz (85.548 kg)    Physical Exam  Constitutional: He is oriented to person, place, and time. He appears well-developed and well-nourished. No distress.  Eyes: Conjunctivae and EOM are normal. Pupils are equal, round, and reactive to light. Right eye exhibits no discharge. No scleral icterus.  Neck: Neck supple. No thyromegaly present.  Cardiovascular: Normal rate, regular rhythm, normal heart sounds and intact distal pulses.   No murmur heard. Pulmonary/Chest: Effort normal and breath sounds normal. No respiratory distress. He has no wheezes.  Musculoskeletal: Normal range of motion. He exhibits no edema.  Lymphadenopathy:    He has no cervical adenopathy.  Neurological: He is alert and oriented to person, place, and time. Coordination normal.  Skin: Skin is warm and dry. No rash noted. He is not diaphoretic.  Psychiatric: He has a normal mood and affect. His behavior is normal.  Nursing note and vitals reviewed.      Assessment & Plan:   Problem List Items Addressed This Visit      Cardiovascular and Mediastinum   HTN (hypertension), benign - Primary   Relevant Orders   CMP14+EGFR   TSH   Lipid panel     Respiratory   COPD  bronchitis     Other   Hyperlipidemia LDL goal <130   Relevant Orders   Lipid panel       Follow up plan: Return in about 3 months (around 12/26/2015), or if symptoms worsen or fail to improve, for Follow-up hypertension and cholesterol.  Counseling provided for all of the vaccine components Orders Placed This Encounter  Procedures  . CMP14+EGFR  . TSH  . Lipid panel    Caryl Pina, MD  [place] 09/25/2015, 11:08 AM

## 2015-10-01 ENCOUNTER — Other Ambulatory Visit: Payer: Medicare Other

## 2015-10-01 DIAGNOSIS — E785 Hyperlipidemia, unspecified: Secondary | ICD-10-CM

## 2015-10-01 DIAGNOSIS — I10 Essential (primary) hypertension: Secondary | ICD-10-CM | POA: Diagnosis not present

## 2015-10-02 LAB — CMP14+EGFR
ALK PHOS: 104 IU/L (ref 39–117)
ALT: 12 IU/L (ref 0–44)
AST: 15 IU/L (ref 0–40)
Albumin/Globulin Ratio: 2.1 (ref 1.2–2.2)
Albumin: 4.5 g/dL (ref 3.5–4.8)
BILIRUBIN TOTAL: 0.3 mg/dL (ref 0.0–1.2)
BUN/Creatinine Ratio: 13 (ref 10–24)
BUN: 17 mg/dL (ref 8–27)
CHLORIDE: 99 mmol/L (ref 96–106)
CO2: 23 mmol/L (ref 18–29)
CREATININE: 1.28 mg/dL — AB (ref 0.76–1.27)
Calcium: 9.4 mg/dL (ref 8.6–10.2)
GFR calc Af Amer: 64 mL/min/{1.73_m2} (ref >59)
GFR calc non Af Amer: 55 mL/min/{1.73_m2} — ABNORMAL LOW (ref >59)
GLUCOSE: 93 mg/dL (ref 65–99)
Globulin, Total: 2.1 g/dL (ref 1.5–4.5)
Potassium: 4.7 mmol/L (ref 3.5–5.2)
Sodium: 139 mmol/L (ref 134–144)
Total Protein: 6.6 g/dL (ref 6.0–8.5)

## 2015-10-02 LAB — LIPID PANEL
CHOL/HDL RATIO: 3.9 ratio (ref 0.0–5.0)
CHOLESTEROL TOTAL: 138 mg/dL (ref 100–199)
HDL: 35 mg/dL — ABNORMAL LOW (ref >39)
LDL CALC: 85 mg/dL (ref 0–99)
TRIGLYCERIDES: 88 mg/dL (ref 0–149)
VLDL CHOLESTEROL CAL: 18 mg/dL (ref 5–40)

## 2015-10-02 LAB — TSH: TSH: 1.92 u[IU]/mL (ref 0.450–4.500)

## 2015-12-10 DIAGNOSIS — F319 Bipolar disorder, unspecified: Secondary | ICD-10-CM | POA: Diagnosis not present

## 2016-02-25 DIAGNOSIS — F319 Bipolar disorder, unspecified: Secondary | ICD-10-CM | POA: Diagnosis not present

## 2016-03-18 ENCOUNTER — Encounter: Payer: Self-pay | Admitting: Family Medicine

## 2016-03-18 ENCOUNTER — Ambulatory Visit (INDEPENDENT_AMBULATORY_CARE_PROVIDER_SITE_OTHER): Payer: Medicare Other | Admitting: Family Medicine

## 2016-03-18 VITALS — BP 130/76 | HR 56 | Ht 63.0 in | Wt 193.0 lb

## 2016-03-18 DIAGNOSIS — J439 Emphysema, unspecified: Secondary | ICD-10-CM | POA: Diagnosis not present

## 2016-03-18 DIAGNOSIS — E785 Hyperlipidemia, unspecified: Secondary | ICD-10-CM | POA: Diagnosis not present

## 2016-03-18 DIAGNOSIS — Z23 Encounter for immunization: Secondary | ICD-10-CM

## 2016-03-18 DIAGNOSIS — I10 Essential (primary) hypertension: Secondary | ICD-10-CM | POA: Diagnosis not present

## 2016-03-18 DIAGNOSIS — N182 Chronic kidney disease, stage 2 (mild): Secondary | ICD-10-CM | POA: Insufficient documentation

## 2016-03-18 NOTE — Progress Notes (Signed)
BP 130/76   Pulse (!) 56   Ht 5\' 3"  (1.6 m)   Wt 193 lb (87.5 kg)   BMI 34.19 kg/m    Subjective:    Patient ID: Michael Mercado, male    DOB: 09/07/1940, 75 y.o.   MRN: [id number]  HPI: Michael CunasCharles E Bellavance is a 75 y.o. male presenting on 03/18/2016 for Annual Exam   HPI Hypertension recheck Patient is coming in for hypertension recheck. His blood pressure today is 130/76. He is currently on amlodipine. Patient denies headaches, blurred vision, chest pains, shortness of breath, or weakness. Denies any side effects from medication and is content with current medication.   Hyperlipidemia Patient is currently coming in for a recheck on his hyperlipidemia. He is currently on gemfibrozil and has been intolerant of statins previously. He denies any focal numbness or weakness or chest pain.  COPD recheck Patient is coming in for a COPD recheck. He is currently on albuterol. He is currently only to having to use his albuterol about once every 3-4 weeks. He denies any shortness of breath or wheezing. He denies any current nighttime episodes.  Relevant past medical, surgical, family and social history reviewed and updated as indicated. Interim medical history since our last visit reviewed. Allergies and medications reviewed and updated.  Review of Systems  Constitutional: Negative for chills and fever.  HENT: Negative for congestion, ear discharge, ear pain, postnasal drip, rhinorrhea, sinus pressure, sneezing, sore throat and voice change.   Eyes: Negative for pain, discharge, redness and visual disturbance.  Respiratory: Negative for cough, shortness of breath and wheezing.   Cardiovascular: Negative for chest pain and leg swelling.  Musculoskeletal: Negative for gait problem.  Skin: Negative for rash.  Neurological: Negative for dizziness, weakness, light-headedness and headaches.  All other systems reviewed and are negative.   Per HPI unless specifically indicated above       Objective:    BP 130/76   Pulse (!) 56   Ht 5\' 3"  (1.6 m)   Wt 193 lb (87.5 kg)   BMI 34.19 kg/m   Wt Readings from Last 3 Encounters:  03/18/16 193 lb (87.5 kg)  09/25/15 188 lb 9.6 oz (85.5 kg)  06/25/15 191 lb 3.2 oz (86.7 kg)    Physical Exam  Constitutional: He is oriented to person, place, and time. He appears well-developed and well-nourished. No distress.  Eyes: Conjunctivae are normal. Right eye exhibits no discharge. Left eye exhibits no discharge. No scleral icterus.  Cardiovascular: Normal rate, regular rhythm, normal heart sounds and intact distal pulses.   No murmur heard. Pulmonary/Chest: Effort normal and breath sounds normal. No respiratory distress. He has no wheezes. He has no rales.  Musculoskeletal: Normal range of motion. He exhibits no edema.  Neurological: He is alert and oriented to person, place, and time. Coordination normal.  Skin: Skin is warm and dry. No rash noted. He is not diaphoretic.  Psychiatric: He has a normal mood and affect. His behavior is normal.  Nursing note and vitals reviewed.     Assessment & Plan:   Problem List Items Addressed This Visit      Cardiovascular and Mediastinum   HTN (hypertension), benign - Primary     Respiratory   COPD (chronic obstructive pulmonary disease) (HCC)     Other   Hyperlipidemia LDL goal <130    Other Visit Diagnoses    Encounter for immunization       Relevant Orders   Flu Vaccine QUAD 36+  mos IM (Completed)      Continue current medications.  Follow up plan: Return in about 6 months (around 09/15/2016), or if symptoms worsen or fail to improve, for Hypertension and hyperlipidemia follow-up.  Counseling provided for all of the vaccine components No orders of the defined types were placed in this encounter.   Arville CareJoshua Nathalee Smarr, MD [facility] 03/18/2016, 2:59 PM

## 2016-06-09 ENCOUNTER — Telehealth: Payer: Self-pay | Admitting: Family Medicine

## 2016-06-09 DIAGNOSIS — F319 Bipolar disorder, unspecified: Secondary | ICD-10-CM | POA: Diagnosis not present

## 2016-09-08 DIAGNOSIS — F319 Bipolar disorder, unspecified: Secondary | ICD-10-CM | POA: Diagnosis not present

## 2016-09-15 ENCOUNTER — Ambulatory Visit: Payer: Medicare Other | Admitting: Family Medicine

## 2016-09-16 ENCOUNTER — Encounter: Payer: Self-pay | Admitting: Family Medicine

## 2016-09-17 ENCOUNTER — Ambulatory Visit (INDEPENDENT_AMBULATORY_CARE_PROVIDER_SITE_OTHER): Payer: Medicare Other | Admitting: Family Medicine

## 2016-09-17 ENCOUNTER — Encounter: Payer: Self-pay | Admitting: Family Medicine

## 2016-09-17 VITALS — BP 137/89 | HR 55 | Temp 97.4°F | Ht 63.0 in | Wt 181.0 lb

## 2016-09-17 DIAGNOSIS — Z1211 Encounter for screening for malignant neoplasm of colon: Secondary | ICD-10-CM

## 2016-09-17 DIAGNOSIS — F209 Schizophrenia, unspecified: Secondary | ICD-10-CM | POA: Diagnosis not present

## 2016-09-17 DIAGNOSIS — Z125 Encounter for screening for malignant neoplasm of prostate: Secondary | ICD-10-CM | POA: Diagnosis not present

## 2016-09-17 DIAGNOSIS — E785 Hyperlipidemia, unspecified: Secondary | ICD-10-CM | POA: Diagnosis not present

## 2016-09-17 DIAGNOSIS — H6122 Impacted cerumen, left ear: Secondary | ICD-10-CM

## 2016-09-17 DIAGNOSIS — I10 Essential (primary) hypertension: Secondary | ICD-10-CM

## 2016-09-17 NOTE — Progress Notes (Signed)
BP 137/89   Pulse (!) 55   Temp 97.4 F (36.3 C) (Oral)   Ht 5' 3"  (1.6 m)   Wt 181 lb (82.1 kg)   BMI 32.06 kg/m    Subjective:    Patient ID: Michael Mercado, male    DOB: 1941/03/17, 76 y.o.   MRN: [id number]  HPI: Michael Mercado is a 76 y.o. male presenting on 09/17/2016 for Hyperlipidemia (6 mo); Hypertension; and GI referral (never had colonoscopy, is he too old)   HPI Hyperlipidemia Patient is coming in for recheck of his hyperlipidemia. He is currently taking Gemfibrozil. He denies any issues with myalgias or history of liver damage from it. He denies any focal numbness or weakness or chest pain.   Hypertension Patient is currently on amlodipine, and her blood pressure today is 137/89. Patient denies any lightheadedness or dizziness. Patient denies headaches, blurred vision, chest pains, shortness of breath, or weakness. Denies any side effects from medication and is content with current medication.   Cerumen impaction left Patient feels like his left ear is been plugged up and is been having issues with it and feels like sometimes he gets a shooting pain in his well. He does not have this frequently but wants to get it checked today. He denies any fevers or chills or cough or congestion.  Schizophrenia Patient has known schizophrenia and sees a psychiatrist and lives in a group home for this. He says it is very stable on his medications as does his caretaker who brought him here today.  Relevant past medical, surgical, family and social history reviewed and updated as indicated. Interim medical history since our last visit reviewed. Allergies and medications reviewed and updated.  Review of Systems  Constitutional: Negative for chills and fever.  HENT: Positive for ear pain. Negative for congestion, ear discharge, rhinorrhea, sinus pain, sinus pressure and sneezing.   Respiratory: Negative for shortness of breath and wheezing.   Cardiovascular: Negative for  chest pain and leg swelling.  Musculoskeletal: Negative for back pain and gait problem.  Skin: Negative for rash.  Neurological: Negative for dizziness, weakness and light-headedness.  Psychiatric/Behavioral: Positive for decreased concentration. Negative for dysphoric mood, self-injury, sleep disturbance and suicidal ideas. The patient is not nervous/anxious.   All other systems reviewed and are negative.   Per HPI unless specifically indicated above      Objective:    BP 137/89   Pulse (!) 55   Temp 97.4 F (36.3 C) (Oral)   Ht 5' 3"  (1.6 m)   Wt 181 lb (82.1 kg)   BMI 32.06 kg/m   Wt Readings from Last 3 Encounters:  09/17/16 181 lb (82.1 kg)  03/18/16 193 lb (87.5 kg)  09/25/15 188 lb 9.6 oz (85.5 kg)    Physical Exam  Constitutional: He is oriented to person, place, and time. He appears well-developed and well-nourished. No distress.  HENT:  Right Ear: Tympanic membrane, external ear and ear canal normal.  Left Ear: There is drainage (cerumen impacted in left ear, nurse to lavage and unable to remove, recommended debrox drops).  Eyes: Conjunctivae are normal. No scleral icterus.  Neck: Neck supple. No thyromegaly present.  Cardiovascular: Normal rate, regular rhythm, normal heart sounds and intact distal pulses.   No murmur heard. Pulmonary/Chest: Effort normal and breath sounds normal. No respiratory distress. He has no wheezes. He has no rales.  Musculoskeletal: Normal range of motion. He exhibits no edema.  Lymphadenopathy:    He has no  cervical adenopathy.  Neurological: He is alert and oriented to person, place, and time. Coordination normal.  Skin: Skin is warm and dry. No rash noted. He is not diaphoretic.  Psychiatric: He has a normal mood and affect. His behavior is normal.  Nursing note and vitals reviewed.  Cerumen impaction left ear, lavage: Nurse to lavage left ear, able to remove wax, patient tolerated well.    Assessment & Plan:   Problem List  Items Addressed This Visit      Cardiovascular and Mediastinum   HTN (hypertension), benign   Relevant Orders   CMP14+EGFR (Completed)     Other   Schizophrenia ([place])   Relevant Orders   CBC with Differential/Platelet (Completed)   Hyperlipidemia LDL goal <130 - Primary   Relevant Orders   Lipid panel (Completed)    Other Visit Diagnoses    Colon cancer screening       Relevant Orders   Ambulatory referral to Gastroenterology   Prostate cancer screening       Relevant Orders   PSA, total and free (Completed)   Impacted cerumen of left ear          Follow up plan: Return in about 6 months (around 03/19/2017), or if symptoms worsen or fail to improve, for Recheck hyperlipidemia.  Counseling provided for all of the vaccine components Orders Placed This Encounter  Procedures  . CMP14+EGFR  . CBC with Differential/Platelet  . Lipid panel  . PSA, total and free  . Ambulatory referral to Gastroenterology    Caryl Pina, MD [facility] 09/17/2016, 11:16 AM

## 2016-09-18 LAB — CMP14+EGFR
A/G RATIO: 2 (ref 1.2–2.2)
ALBUMIN: 4.4 g/dL (ref 3.5–4.8)
ALT: 14 IU/L (ref 0–44)
AST: 22 IU/L (ref 0–40)
Alkaline Phosphatase: 119 IU/L — ABNORMAL HIGH (ref 39–117)
BUN/Creatinine Ratio: 12 (ref 10–24)
BUN: 14 mg/dL (ref 8–27)
Bilirubin Total: 0.3 mg/dL (ref 0.0–1.2)
CALCIUM: 9.4 mg/dL (ref 8.6–10.2)
CO2: 25 mmol/L (ref 18–29)
CREATININE: 1.19 mg/dL (ref 0.76–1.27)
Chloride: 104 mmol/L (ref 96–106)
GFR, EST AFRICAN AMERICAN: 69 mL/min/{1.73_m2} (ref >59)
GFR, EST NON AFRICAN AMERICAN: 60 mL/min/{1.73_m2} (ref >59)
Globulin, Total: 2.2 g/dL (ref 1.5–4.5)
Glucose: 81 mg/dL (ref 65–99)
Potassium: 4.8 mmol/L (ref 3.5–5.2)
Sodium: 142 mmol/L (ref 134–144)
TOTAL PROTEIN: 6.6 g/dL (ref 6.0–8.5)

## 2016-09-18 LAB — PSA, TOTAL AND FREE
PROSTATE SPECIFIC AG, SERUM: 0.3 ng/mL (ref 0.0–4.0)
PSA, Free Pct: 16.7 %
PSA, Free: 0.05 ng/mL

## 2016-09-18 LAB — LIPID PANEL
CHOL/HDL RATIO: 3.9 ratio (ref 0.0–5.0)
Cholesterol, Total: 134 mg/dL (ref 100–199)
HDL: 34 mg/dL — ABNORMAL LOW (ref >39)
LDL Calculated: 82 mg/dL (ref 0–99)
Triglycerides: 89 mg/dL (ref 0–149)
VLDL CHOLESTEROL CAL: 18 mg/dL (ref 5–40)

## 2016-09-18 LAB — CBC WITH DIFFERENTIAL/PLATELET
BASOS: 1 %
Basophils Absolute: 0.1 10*3/uL (ref 0.0–0.2)
EOS (ABSOLUTE): 0.4 10*3/uL (ref 0.0–0.4)
EOS: 7 %
HEMATOCRIT: 40.7 % (ref 37.5–51.0)
Hemoglobin: 14 g/dL (ref 13.0–17.7)
IMMATURE GRANS (ABS): 0 10*3/uL (ref 0.0–0.1)
IMMATURE GRANULOCYTES: 0 %
Lymphocytes Absolute: 1.7 10*3/uL (ref 0.7–3.1)
Lymphs: 29 %
MCH: 29.9 pg (ref 26.6–33.0)
MCHC: 34.4 g/dL (ref 31.5–35.7)
MCV: 87 fL (ref 79–97)
MONOS ABS: 0.5 10*3/uL (ref 0.1–0.9)
Monocytes: 8 %
NEUTROS ABS: 3.3 10*3/uL (ref 1.4–7.0)
NEUTROS PCT: 55 %
PLATELETS: 215 10*3/uL (ref 150–379)
RBC: 4.69 x10E6/uL (ref 4.14–5.80)
RDW: 15.5 % — ABNORMAL HIGH (ref 12.3–15.4)
WBC: 6 10*3/uL (ref 3.4–10.8)

## 2016-09-23 ENCOUNTER — Other Ambulatory Visit: Payer: Self-pay | Admitting: Family Medicine

## 2016-09-23 ENCOUNTER — Encounter (INDEPENDENT_AMBULATORY_CARE_PROVIDER_SITE_OTHER): Payer: Self-pay | Admitting: *Deleted

## 2016-09-23 ENCOUNTER — Ambulatory Visit (INDEPENDENT_AMBULATORY_CARE_PROVIDER_SITE_OTHER): Payer: Medicare Other | Admitting: *Deleted

## 2016-09-23 VITALS — BP 144/76 | Ht 66.25 in | Wt 181.0 lb

## 2016-09-23 DIAGNOSIS — Z0000 Encounter for general adult medical examination without abnormal findings: Secondary | ICD-10-CM

## 2016-09-23 DIAGNOSIS — Z23 Encounter for immunization: Secondary | ICD-10-CM

## 2016-09-23 NOTE — Patient Instructions (Addendum)
[name] , Thank you for taking time to come for your Medicare Wellness Visit. I appreciate your ongoing commitment to your health goals. Please review the following plan we discussed and let me know if I can assist you in the future.   These are the goals we discussed:  Goals Walk for 30 minutes daily  You received a tetanus vaccine today. A referral was ordered for a colonoscopy. You should receive a call by next week. If not, call the referral department at [phone] to check on the status of the referral. I will find out about Advance Directives and give you a call. If you find out anything sooner you can call me, Michael Mercado, at [phone]. Schedule eye exam  This is a list of the screening recommended for you and due dates:  Health Maintenance  Topic Date Due  . Colon Cancer Screening  03/25/1992  . Pneumonia vaccines (1 of 2 - PCV13) 12/18/2016*  . Tetanus Vaccine  03/19/2017*  . Flu Shot  11/18/2016  *Topic was postponed. The date shown is not the original due date.    Tdap Vaccine (Tetanus, Diphtheria and Pertussis): What You Need to Know 1. Why get vaccinated? Tetanus, diphtheria and pertussis are very serious diseases. Tdap vaccine can protect [place] from these diseases. And, Tdap vaccine given to pregnant women can protect newborn babies against pertussis. TETANUS (Lockjaw) is rare in the [place] today. It causes painful muscle tightening and stiffness, usually all over the body.  It can lead to tightening of muscles in the head and neck so you can't open your mouth, swallow, or sometimes even breathe. Tetanus kills about 1 out of 10 people who are infected even after receiving the best medical care.  DIPHTHERIA is also rare in the [place] today. It can cause a thick coating to form in the back of the throat.  It can lead to breathing problems, heart failure, paralysis, and death.  PERTUSSIS (Whooping Cough) causes severe coughing spells, which can cause  difficulty breathing, vomiting and disturbed sleep.  It can also lead to weight loss, incontinence, and rib fractures. Up to 2 in 100 adolescents and 5 in 100 adults with pertussis are hospitalized or have complications, which could include pneumonia or death.  These diseases are caused by bacteria. Diphtheria and pertussis are spread from person to person through secretions from coughing or sneezing. Tetanus enters the body through cuts, scratches, or wounds. Before vaccines, as many as 200,000 cases of diphtheria, 200,000 cases of pertussis, and hundreds of cases of tetanus, were reported in the [place] each year. Since vaccination began, reports of cases for tetanus and diphtheria have dropped by about 99% and for pertussis by about 80%. 2. Tdap vaccine Tdap vaccine can protect adolescents and adults from tetanus, diphtheria, and pertussis. One dose of Tdap is routinely given at age 34 or 53. People who did not get Tdap at that age should get it as soon as possible. Tdap is especially important for healthcare professionals and anyone having close contact with a baby younger than 12 months. Pregnant women should get a dose of Tdap during every pregnancy, to protect the newborn from pertussis. Infants are most at risk for severe, life-threatening complications from pertussis. Another vaccine, called Td, protects against tetanus and diphtheria, but not pertussis. A Td booster should be given every 10 years. Tdap may be given as one of these boosters if you have never gotten Tdap before. Tdap may also be given after a  severe cut or burn to prevent tetanus infection. Your doctor or the person giving you the vaccine can give you more information. Tdap may safely be given at the same time as other vaccines. 3. Some people should not get this vaccine  A person who has ever had a life-threatening allergic reaction after a previous dose of any diphtheria, tetanus or pertussis containing vaccine, OR has  a severe allergy to any part of this vaccine, should not get Tdap vaccine. Tell the person giving the vaccine about any severe allergies.  Anyone who had coma or long repeated seizures within 7 days after a childhood dose of DTP or DTaP, or a previous dose of Tdap, should not get Tdap, unless a cause other than the vaccine was found. They can still get Td.  Talk to your doctor if you: ? have seizures or another nervous system problem, ? had severe pain or swelling after any vaccine containing diphtheria, tetanus or pertussis, ? ever had a condition called Guillain-Barr Syndrome (GBS), ? aren't feeling well on the day the shot is scheduled. 4. Risks With any medicine, including vaccines, there is a chance of side effects. These are usually mild and go away on their own. Serious reactions are also possible but are rare. Most people who get Tdap vaccine do not have any problems with it. Mild problems following Tdap: (Did not interfere with activities)  Pain where the shot was given (about 3 in 4 adolescents or 2 in 3 adults)  Redness or swelling where the shot was given (about 1 person in 5)  Mild fever of at least 100.74F (up to about 1 in 25 adolescents or 1 in 100 adults)  Headache (about 3 or 4 people in 10)  Tiredness (about 1 person in 3 or 4)  Nausea, vomiting, diarrhea, stomach ache (up to 1 in 4 adolescents or 1 in 10 adults)  Chills, sore joints (about 1 person in 10)  Body aches (about 1 person in 3 or 4)  Rash, swollen glands (uncommon)  Moderate problems following Tdap: (Interfered with activities, but did not require medical attention)  Pain where the shot was given (up to 1 in 5 or 6)  Redness or swelling where the shot was given (up to about 1 in 16 adolescents or 1 in 12 adults)  Fever over 102F (about 1 in 100 adolescents or 1 in 250 adults)  Headache (about 1 in 7 adolescents or 1 in 10 adults)  Nausea, vomiting, diarrhea, stomach ache (up to 1 or 3  people in 100)  Swelling of the entire arm where the shot was given (up to about 1 in 500).  Severe problems following Tdap: (Unable to perform usual activities; required medical attention)  Swelling, severe pain, bleeding and redness in the arm where the shot was given (rare).  Problems that could happen after any vaccine:  People sometimes faint after a medical procedure, including vaccination. Sitting or lying down for about 15 minutes can help prevent fainting, and injuries caused by a fall. Tell your doctor if you feel dizzy, or have vision changes or ringing in the ears.  Some people get severe pain in the shoulder and have difficulty moving the arm where a shot was given. This happens very rarely.  Any medication can cause a severe allergic reaction. Such reactions from a vaccine are very rare, estimated at fewer than 1 in a million doses, and would happen within a few minutes to a few hours after the vaccination.  As with any medicine, there is a very remote chance of a vaccine causing a serious injury or death. The safety of vaccines is always being monitored. For more information, visit: http://floyd.org/ 5. What if there is a serious problem? What should I look for? Look for anything that concerns you, such as signs of a severe allergic reaction, very high fever, or unusual behavior. Signs of a severe allergic reaction can include hives, swelling of the face and throat, difficulty breathing, a fast heartbeat, dizziness, and weakness. These would usually start a few minutes to a few hours after the vaccination. What should I do?  If you think it is a severe allergic reaction or other emergency that can't wait, call 9-1-1 or get the person to the nearest hospital. Otherwise, call your doctor.  Afterward, the reaction should be reported to the Vaccine Adverse Event Reporting System (VAERS). Your doctor might file this report, or you can do it yourself through the VAERS web  site at www.vaers.LAgents.no, or by calling 1-646-514-4168. ? VAERS does not give medical advice. 6. The National Vaccine Injury Compensation Program The [employer] Vaccine Injury Compensation Program (VICP) is a federal program that was created to compensate people who may have been injured by certain vaccines. Persons who believe they may have been injured by a vaccine can learn about the program and about filing a claim by calling 1-(860)217-0756 or visiting the VICP website at SpiritualWord.at. There is a time limit to file a claim for compensation. 7. How can I learn more?  Ask your doctor. He or she can give you the vaccine package insert or suggest other sources of information.  Call your local or state health department.  Contact the Centers for Disease Control and Prevention (CDC): ? Call (726)500-5209 (1-800-CDC-INFO) or ? Visit CDC's website at PicCapture.uy CDC Tdap Vaccine VIS (06/13/13) This information is not intended to replace advice given to you by your health care provider. Make sure you discuss any questions you have with your health care provider. Document Released: 10/06/2011 Document Revised: 12/26/2015 Document Reviewed: 12/26/2015 Elsevier Interactive Patient Education  2017 [employer].

## 2016-09-23 NOTE — Progress Notes (Signed)
Subjective:   Michael Mercado is a 76 y.o. male who presents for an Initial Medicare Annual Wellness Visit. Michael Mercado is accomapnied by his caregiver, Michael Mercado. He has mental retardation and lives in [facility]. At the group home he enjoys playing games, gardening, and going on day trips. They serve 3 meals a day. He reports having a niece and a nephew. Michael Mercado is aware of the niece. His only sister passed away recently. Even when is sister was living he was considered his own guardian and no one has legal custody of him.  Review of Systems   Cardiac Risk Factors include: Other (see comment);advanced age (>17men, >65 women);sedentary lifestyle;smoking/ tobacco exposure;obesity (BMI >30kg/m2);dyslipidemia;hypertension;male gender, Risk factor comments: mentally disabled  He was unable to answer whether is health is the same, better, or worse than last year.   He did not have any complaints.     Objective:    Today's Vitals   09/23/16 1122  BP: (!) 144/76  Weight: 181 lb (82.1 kg)  Height: 5' 6.25" (1.683 m)   Body mass index is 28.99 kg/m.  Current Medications (verified) Outpatient Encounter Prescriptions as of 09/23/2016  Medication Sig  . acetaminophen (TYLENOL) 500 MG tablet Take 500 mg by mouth every 4 (four) hours as needed.  [name] albuterol (PROVENTIL HFA;VENTOLIN HFA) 108 (90 BASE) MCG/ACT inhaler Inhale 2 puffs into the lungs every 6 (six) hours as needed for wheezing or shortness of breath.  [name] alum & mag hydroxide-simeth (MAALOX/MYLANTA) 200-200-20 MG/5ML suspension Take 10 mLs by mouth every 8 (eight) hours as needed for indigestion or heartburn. Reported on 09/25/2015  . amLODipine (NORVASC) 5 MG tablet Take 5 mg by mouth daily.  . ASPIRIN LOW DOSE 81 MG EC tablet TAKE 1 TABLET BY MOUTH ONCE DAILY. **DO NOT CRUSH**  . clonazePAM (KLONOPIN) 0.5 MG tablet Take 0.5 mg by mouth 2 (two) times daily as needed for anxiety.  . diphenhydrAMINE (BENADRYL) 25 mg capsule  Take 25 mg by mouth every 6 (six) hours as needed.  . Emollient (LUBRICATING LOTION EX) Apply topically 2 (two) times daily.  [name] gemfibrozil (LOPID) 600 MG tablet TAKE 1 TABLET BY MOUTH TWICE DAILY.  . hydrocortisone cream 1 % Apply 1 application topically 3 (three) times daily. As needed  . ibuprofen (ADVIL,MOTRIN) 400 MG tablet Take 400 mg by mouth every 8 (eight) hours as needed.  . loperamide (IMODIUM) 2 MG capsule Take by mouth as needed for diarrhea or loose stools.  . magnesium hydroxide (MILK OF MAGNESIA) 400 MG/5ML suspension Take 30 mLs by mouth daily as needed for mild constipation.  [name] PARoxetine (PAXIL) 20 MG tablet Take 20 mg by mouth daily. Take at bedtime  . Polyethylene Glycol (LIP BALM BASE EX) Apply topically.  . risperiDONE (RISPERDAL) 1 MG tablet Take 1 mg by mouth 2 (two) times daily.  . Sodium Bicarbonate (AFTER BITE EX) Apply topically as needed.  . Sunscreens (COPPERTONE SPORT SPF30 EX) Apply topically.   No facility-administered encounter medications on file as of 09/23/2016.     Allergies (verified) Patient has no known allergies.   History: Past Medical History:  Diagnosis Date  . Hypercholesteremia   . Hypertension   . Moderate intellectual disability   . Mood disorder (HCC)   . Michael (mental retardation)   . Obesity    History reviewed. No pertinent surgical history. Family History  Problem Relation Age of Onset  . Stroke Sister    Social History   Occupational History  .  Not on file.   Social History Main Topics  . Smoking status: Current Every Day Smoker    Packs/day: 0.33    Types: Cigarettes  . Smokeless tobacco: Never Used  . Alcohol use No  . Drug use: No  . Sexual activity: No   Tobacco Counseling Ready to quit: No Counseling given: No Patient is rationed 7 cigarettes a day. He is on a 2 hour smoking schedule.  Activities of Daily Living In your present state of health, do you have any difficulty performing the following activities:  09/23/2016  Hearing? N  Vision? N  Difficulty concentrating or making decisions? (No Data)  Walking or climbing stairs? N  Dressing or bathing? N  Doing errands, shopping? N  Preparing Food and eating ? Y  Using the Toilet? N  In the past six months, have you accidently leaked urine? N  Do you have problems with loss of bowel control? N  Managing your Medications? Y  Managing your Finances? Y  Housekeeping or managing your Housekeeping? Y  Some recent data might be hidden  Difficulties due to mental retardation.   Immunizations and Health Maintenance Immunization History  Administered Date(s) Administered  . Influenza,inj,Quad PF,36+ Mos 03/18/2016  . Tdap 09/23/2016   Health Maintenance Due  Topic Date Due  . COLONOSCOPY  03/25/1992    Patient Care Team: Dettinger, Elige RadonJoshua A, MD as PCP - General (Family Medicine) Salomon MastBefekadu, Belayenh, MD as Consulting Physician (Nephrology) Antonietta Breachhandler, Mark C, MD as Referring Physician (Neurology)  [place] states that the patient has not had any hospitalizations, ER visits, or surgeries in the last year.    Assessment:   This is a routine wellness examination for [place].  Hearing/Vision screen Patient was wearing glass. He did not exhibit any difficulty hearing. Vision was not assessed.   Dietary issues and exercise activities discussed: Current Exercise Habits: Home exercise routine, Type of exercise: walking, Time (Minutes): 15, Frequency (Times/Week): 1, Weekly Exercise (Minutes/Week): 15, Intensity: Mild He has a safe place to walk for exercise.   Goals    . Exercise 3x per week (30 min per time)          Walk for 30 minutes daily       Depression Screen PHQ 2/9 Scores 09/23/2016 09/17/2016 03/18/2016 09/25/2015  PHQ - 2 Score - 0 4 0  PHQ- 9 Score - - 11 -  Exception Documentation Other- indicate reason in comment box - - -   Unable to accurately assess due to mental retardation  Fall Risk Fall Risk  09/23/2016 09/17/2016  03/18/2016 09/25/2015 03/18/2015  Falls in the past year? No No No No Yes  Number falls in past yr: - - - - 2 or more  Injury with Fall? - - - - Yes    Cognitive Function:    Unable to assess    Screening Tests Health Maintenance  Topic Date Due  . COLONOSCOPY  03/25/1992  . PNA vac Low Risk Adult (1 of 2 - PCV13) 12/18/2016 (Originally 03/26/2007)  . TETANUS/TDAP  03/19/2017 (Originally 03/25/1961)  . INFLUENZA VACCINE  11/18/2016        Plan:  -Colonoscopy referral updated to request [place] location. Referral Dept contact information given to [place]. She will f/u with them next week if they haven't received a call about an appt.  -Tdap given today -Declined Prevnar. Would prefer to wait and get it with the flu shot in September.  -[place] and I are going to find out how Advance  Directives work in this situation. I will do some research and let her know. She is going to consult with the group home administrator.  -Keep f/u with Dr Dettinger in 03/2017.  I have personally reviewed and noted the following in the patient's chart:   . Medical and social history  . Use of alcohol, tobacco or illicit drugs  . Current medications and supplements . Functional ability and status . Nutritional status . Physical activity . Advanced directives . List of other physicians . Hospitalizations, surgeries, and ER visits in previous 12 months . Vitals . Screenings to include cognitive, depression, and falls . Referrals and appointments  In addition, I have reviewed and discussed with patient certain preventive protocols, quality metrics, and best practice recommendations. A written personalized care plan for preventive services as well as general preventive health recommendations were provided to patient.     Demetrios Loll, RN  09/23/2016   I have reviewed and agree with the above AWV documentation.   Arville Care, MD [facility] 09/24/2016, 1:19  PM

## 2016-12-02 DIAGNOSIS — F319 Bipolar disorder, unspecified: Secondary | ICD-10-CM | POA: Diagnosis not present

## 2016-12-10 ENCOUNTER — Ambulatory Visit (INDEPENDENT_AMBULATORY_CARE_PROVIDER_SITE_OTHER): Payer: Medicare Other | Admitting: Family Medicine

## 2016-12-10 ENCOUNTER — Encounter: Payer: Self-pay | Admitting: Family Medicine

## 2016-12-10 VITALS — BP 133/82 | HR 63 | Temp 98.2°F | Ht 66.25 in | Wt 183.0 lb

## 2016-12-10 DIAGNOSIS — R32 Unspecified urinary incontinence: Secondary | ICD-10-CM | POA: Diagnosis not present

## 2016-12-10 LAB — URINALYSIS, COMPLETE
BILIRUBIN UA: NEGATIVE
Glucose, UA: NEGATIVE
KETONES UA: NEGATIVE
Leukocytes, UA: NEGATIVE
Nitrite, UA: NEGATIVE
PH UA: 6 (ref 5.0–7.5)
PROTEIN UA: NEGATIVE
RBC UA: NEGATIVE
SPEC GRAV UA: 1.01 (ref 1.005–1.030)
UUROB: 0.2 mg/dL (ref 0.2–1.0)

## 2016-12-10 LAB — MICROSCOPIC EXAMINATION
Bacteria, UA: NONE SEEN
Epithelial Cells (non renal): NONE SEEN /hpf (ref 0–10)
RBC, UA: NONE SEEN /hpf (ref 0–?)
RENAL EPITHEL UA: NONE SEEN /HPF
WBC UA: NONE SEEN /HPF (ref 0–?)

## 2016-12-10 NOTE — Progress Notes (Signed)
   BP 133/82   Pulse 63   Temp 98.2 F (36.8 C) (Oral)   Ht 5' 6.25" (1.683 m)   Wt 183 lb (83 kg)   BMI 29.31 kg/m    Subjective:    Patient ID: Michael Mercado, male    DOB: 1941-03-27, 76 y.o.   MRN: [id number]  HPI: Michael Mercado is a 76 y.o. male presenting on 12/10/2016 for Urinary Incontinence   HPI Urinary incontinence wetting himself Patient is brought in today by his caretaker incontinence and wetting himself. She has not been having major issues with previously. He is less himself both at night and sometimes during the day as well and says that he feels like he just cannot make it to the restroom quick enough. Patient denies any dysuria or pain. He denies any abdominal pain or flank pain.  Relevant past medical, surgical, family and social history reviewed and updated as indicated. Interim medical history since our last visit reviewed. Allergies and medications reviewed and updated.  Review of Systems  Constitutional: Negative for chills and fever.  Respiratory: Negative for shortness of breath and wheezing.   Cardiovascular: Negative for chest pain and leg swelling.  Gastrointestinal: Negative for abdominal pain.  Genitourinary: Positive for frequency. Negative for dysuria, flank pain, hematuria and urgency.  Musculoskeletal: Negative for back pain and gait problem.  Skin: Negative for rash.  Neurological: Negative for dizziness, weakness and numbness.  All other systems reviewed and are negative.   Per HPI unless specifically indicated above     Objective:    BP 133/82   Pulse 63   Temp 98.2 F (36.8 C) (Oral)   Ht 5' 6.25" (1.683 m)   Wt 183 lb (83 kg)   BMI 29.31 kg/m   Wt Readings from Last 3 Encounters:  12/10/16 183 lb (83 kg)  09/23/16 181 lb (82.1 kg)  09/17/16 181 lb (82.1 kg)    Physical Exam  Constitutional: He is oriented to person, place, and time. He appears well-developed and well-nourished. No distress.  Eyes: Conjunctivae  are normal. No scleral icterus.  Cardiovascular: Normal rate, regular rhythm, normal heart sounds and intact distal pulses.   No murmur heard. Pulmonary/Chest: Effort normal and breath sounds normal. No respiratory distress. He has no wheezes.  Abdominal: Soft. Bowel sounds are normal. He exhibits no distension. There is no tenderness. There is no rebound and no guarding.  Musculoskeletal: Normal range of motion. He exhibits no edema.  Neurological: He is alert and oriented to person, place, and time. Coordination normal.  Skin: Skin is warm and dry. No rash noted. He is not diaphoretic.  Psychiatric: He has a normal mood and affect. His behavior is normal.  Nursing note and vitals reviewed.     Assessment & Plan:   Problem List Items Addressed This Visit    None    Visit Diagnoses    Urinary incontinence, unspecified type    -  Primary   Difficult to tell based on history of his having prostate troubles or incontinence, will send to urology   Relevant Orders   Urinalysis, Complete (Completed)   Ambulatory referral to Urology      Follow up plan: Return if symptoms worsen or fail to improve.  Counseling provided for all of the vaccine components Orders Placed This Encounter  Procedures  . Urinalysis, Complete  . Ambulatory referral to Urology    Arville Care, MD [facility] 12/10/2016, 2:12 PM

## 2016-12-17 ENCOUNTER — Encounter: Payer: Self-pay | Admitting: Family Medicine

## 2016-12-17 ENCOUNTER — Ambulatory Visit (INDEPENDENT_AMBULATORY_CARE_PROVIDER_SITE_OTHER): Payer: Medicare Other | Admitting: Family Medicine

## 2016-12-17 VITALS — BP 153/75 | HR 76 | Temp 97.6°F | Ht 66.25 in | Wt 185.6 lb

## 2016-12-17 DIAGNOSIS — F424 Excoriation (skin-picking) disorder: Secondary | ICD-10-CM | POA: Diagnosis not present

## 2016-12-17 DIAGNOSIS — L03114 Cellulitis of left upper limb: Secondary | ICD-10-CM | POA: Diagnosis not present

## 2016-12-17 MED ORDER — CEPHALEXIN 500 MG PO CAPS: 500.0000 mg | ORAL_CAPSULE | Freq: Three times a day (TID) | ORAL | 0 refills | Status: DC

## 2016-12-17 NOTE — Progress Notes (Signed)
   HPI  Patient presents today here with swelling and redness of the left hand.  Patient has inhalational disability and lives in a group home. His caretaker, [place],  Is present and states that he has a problem with picking his skin. She states that no matter what she does he takes his skin. She has tried multiple things unsuccessfully. Patient was picking the skin off the top of his hand about 6 days ago creating a lesion and has developed this redness and swelling over the last few days.  They deny any fever, chills, sweats. He has normal food and fluid intake.  PMH: Smoking status noted ROS: Per HPI  Objective: BP (!) 153/75   Pulse 76   Temp 97.6 F (36.4 C) (Oral)   Ht 5' 6.25" (1.683 m)   Wt 185 lb 9.6 oz (84.2 kg)   BMI 29.73 kg/m  Gen: NAD, alert, cooperative with exam HEENT: NCAT CV: RRR, good S1/S2, no murmur Resp: CTABL, no wheezes, non-labored Ext: No edema, warm Neuro: Alert and oriented, No gross deficits Skin:  Crusted lesion on the left dorsal hand approximately 3 cm x 3 cm with mild to moderate swelling throughout the hand, mild erythema, and mild to moderate warmth. No induration or concern for deep abscess.  Assessment and plan:  # Cellulitis of the left hand, skin picking habit Difficult situation with intellectual disability helping him understand that picking his skin will actually hurt him. Recommended positive reinforcement as one idea to try and reduce skin thickening. Treat cellulitis with Keflex, mild case of cellulitis in this case. Low threshold for follow-up if worsening or not improving.    Meds ordered this encounter  Medications  . cephALEXin (KEFLEX) 500 MG capsule    Sig: Take 1 capsule (500 mg total) by mouth 3 (three) times daily.    Dispense:  21 capsule    Refill:  0    Murtis SinkSam Bradshaw, MD [facility] 12/17/2016, 2:55 PM

## 2016-12-17 NOTE — Patient Instructions (Signed)
Great to meet you!  Come back as needed.   Be sure to finish all antibiotics   Cellulitis, Adult Cellulitis is a skin infection. The infected area is usually red and sore. This condition occurs most often in the arms and lower legs. It is very important to get treated for this condition. Follow these instructions at home:  Take over-the-counter and prescription medicines only as told by your doctor.  If you were prescribed an antibiotic medicine, take it as told by your doctor. Do not stop taking the antibiotic even if you start to feel better.  Drink enough fluid to keep your pee (urine) clear or pale yellow.  Do not touch or rub the infected area.  Raise (elevate) the infected area above the level of your heart while you are sitting or lying down.  Place warm or cold wet cloths (warm or cold compresses) on the infected area. Do this as told by your doctor.  Keep all follow-up visits as told by your doctor. This is important. These visits let your doctor make sure your infection is not getting worse. Contact a doctor if:  You have a fever.  Your symptoms do not get better after 1-2 days of treatment.  Your bone or joint under the infected area starts to hurt after the skin has healed.  Your infection comes back. This can happen in the same area or another area.  You have a swollen bump in the infected area.  You have new symptoms.  You feel ill and also have muscle aches and pains. Get help right away if:  Your symptoms get worse.  You feel very sleepy.  You throw up (vomit) or have watery poop (diarrhea) for a long time.  There are red streaks coming from the infected area.  Your red area gets larger.  Your red area turns darker. This information is not intended to replace advice given to you by your health care provider. Make sure you discuss any questions you have with your health care provider. Document Released: 09/23/2007 Document Revised: 09/12/2015  Document Reviewed: 02/13/2015 Elsevier Interactive Patient Education  2018 [employer]

## 2017-01-21 ENCOUNTER — Ambulatory Visit (INDEPENDENT_AMBULATORY_CARE_PROVIDER_SITE_OTHER): Payer: Medicare Other

## 2017-01-21 DIAGNOSIS — Z23 Encounter for immunization: Secondary | ICD-10-CM | POA: Diagnosis not present

## 2017-02-23 ENCOUNTER — Ambulatory Visit: Payer: Medicare Other | Admitting: Urology

## 2017-03-02 ENCOUNTER — Other Ambulatory Visit: Payer: Self-pay | Admitting: Family Medicine

## 2017-03-03 DIAGNOSIS — F319 Bipolar disorder, unspecified: Secondary | ICD-10-CM | POA: Diagnosis not present

## 2017-03-24 ENCOUNTER — Ambulatory Visit: Payer: Medicare Other | Admitting: Family Medicine

## 2017-03-24 ENCOUNTER — Encounter: Payer: Self-pay | Admitting: Family Medicine

## 2017-03-24 ENCOUNTER — Ambulatory Visit (INDEPENDENT_AMBULATORY_CARE_PROVIDER_SITE_OTHER): Payer: Medicare Other | Admitting: Family Medicine

## 2017-03-24 VITALS — BP 141/72 | HR 61 | Temp 96.6°F | Ht 66.25 in | Wt 189.0 lb

## 2017-03-24 DIAGNOSIS — J439 Emphysema, unspecified: Secondary | ICD-10-CM

## 2017-03-24 DIAGNOSIS — F79 Unspecified intellectual disabilities: Secondary | ICD-10-CM

## 2017-03-24 DIAGNOSIS — Z0000 Encounter for general adult medical examination without abnormal findings: Secondary | ICD-10-CM | POA: Diagnosis not present

## 2017-03-24 DIAGNOSIS — N182 Chronic kidney disease, stage 2 (mild): Secondary | ICD-10-CM

## 2017-03-24 DIAGNOSIS — I10 Essential (primary) hypertension: Secondary | ICD-10-CM | POA: Diagnosis not present

## 2017-03-24 DIAGNOSIS — F201 Disorganized schizophrenia: Secondary | ICD-10-CM | POA: Diagnosis not present

## 2017-03-24 DIAGNOSIS — E785 Hyperlipidemia, unspecified: Secondary | ICD-10-CM | POA: Diagnosis not present

## 2017-03-24 MED ORDER — AMLODIPINE BESYLATE 5 MG PO TABS: 7.5000 mg | ORAL_TABLET | Freq: Every day | ORAL | 6 refills | Status: DC

## 2017-03-24 NOTE — Progress Notes (Signed)
BP (!) 141/72   Pulse 61   Temp (!) 96.6 F (35.9 C) (Oral)   Ht 5' 6.25" (1.683 m)   Wt 189 lb (85.7 kg)   BMI 30.28 kg/m    Subjective:    Patient ID: Michael Mercado, male    DOB: 13-Feb-1941, 76 y.o.   MRN: [id number]  HPI: Michael Mercado is a 76 y.o. male presenting on 03/24/2017 for Annual Exam   HPI Physical exam and recheck on labs Patient denies any chest pain, shortness of breath, headaches or vision issues, abdominal complaints, diarrhea, nausea, vomiting, or joint issues.   Hypertension and CKD Patient is currently on amlodipine 5, and their blood pressure today is 141/72 and it has been running about that at [facility] as well. Patient denies any lightheadedness or dizziness. Patient denies headaches, blurred vision, chest pains, shortness of breath, or weakness. Denies any side effects from medication and is content with current medication.   Hyperlipidemia Patient is coming in for recheck of his hyperlipidemia. The patient is currently taking gemfibrozil. They deny any issues with myalgias or history of liver damage from it. They deny any focal numbness or weakness or chest pain.   COPD recheck Patient is coming in for COPD recheck as well.  He has intermittent COPD and has an albuterol inhaler which he has not used quite some time and denies any coughing or wheezing or shortness of breath.  He has really been doing well for a long time.  Schizophrenia and mental retardation Patient sees Dr. Tamera Punt for schizophrenia and mental retardation is on medications to help with mood swings associated to it and is doing well on these medications currently.  Relevant past medical, surgical, family and social history reviewed and updated as indicated. Interim medical history since our last visit reviewed. Allergies and medications reviewed and updated.  Review of Systems  Constitutional: Negative for chills and fever.  HENT: Negative for ear pain and  tinnitus.   Eyes: Negative for pain.  Respiratory: Negative for cough, shortness of breath and wheezing.   Cardiovascular: Negative for chest pain, palpitations and leg swelling.  Gastrointestinal: Negative for abdominal pain, blood in stool, constipation and diarrhea.  Genitourinary: Negative for dysuria and hematuria.  Musculoskeletal: Negative for back pain, gait problem and myalgias.  Skin: Positive for wound (Sores on both posterior forearms where he frequently scratches.  The right forearm is showing some early signs of infection and they are monitoring it). Negative for rash.  Neurological: Negative for dizziness, weakness and headaches.  Psychiatric/Behavioral: Negative for suicidal ideas.  All other systems reviewed and are negative.   Per HPI unless specifically indicated above     Objective:    BP (!) 141/72   Pulse 61   Temp (!) 96.6 F (35.9 C) (Oral)   Ht 5' 6.25" (1.683 m)   Wt 189 lb (85.7 kg)   BMI 30.28 kg/m   Wt Readings from Last 3 Encounters:  03/24/17 189 lb (85.7 kg)  12/17/16 185 lb 9.6 oz (84.2 kg)  12/10/16 183 lb (83 kg)    Physical Exam  Constitutional: He is oriented to person, place, and time. He appears well-developed and well-nourished. No distress.  HENT:  Right Ear: External ear normal.  Left Ear: External ear normal.  Nose: Nose normal.  Mouth/Throat: Oropharynx is clear and moist. No oropharyngeal exudate.  Eyes: Conjunctivae and EOM are normal. Pupils are equal, round, and reactive to light. Right eye exhibits no discharge.  No scleral icterus.  Neck: Neck supple. No thyromegaly present.  Cardiovascular: Normal rate, regular rhythm, normal heart sounds and intact distal pulses.  No murmur heard. Pulmonary/Chest: Effort normal and breath sounds normal. No respiratory distress. He has no wheezes.  Abdominal: Soft. Bowel sounds are normal. He exhibits no distension. There is no tenderness. There is no rebound and no guarding. Hernia  confirmed negative in the right inguinal area and confirmed negative in the left inguinal area.  Genitourinary: Testes normal and penis normal. Uncircumcised. No phimosis, paraphimosis, penile erythema or penile tenderness. No discharge found.  Musculoskeletal: Normal range of motion. He exhibits no edema.  Lymphadenopathy:    He has no cervical adenopathy.       Right: No inguinal adenopathy present.       Left: No inguinal adenopathy present.  Neurological: He is alert and oriented to person, place, and time. Coordination normal.  Skin: Skin is warm and dry. Lesion (sores on both posterior forearms where he frequently scratches.  The right forearm is showing some early signs of infection and they are monitoring it) noted. No rash noted. He is not diaphoretic.  Psychiatric: He has a normal mood and affect. His behavior is normal.  Vitals reviewed.     Assessment & Plan:   Problem List Items Addressed This Visit      Cardiovascular and Mediastinum   HTN (hypertension), benign   Relevant Medications   amLODipine (NORVASC) 5 MG tablet   Other Relevant Orders   CMP14+EGFR     Respiratory   COPD (chronic obstructive pulmonary disease) ([place])     Genitourinary   CKD (chronic kidney disease), stage II   Relevant Orders   CMP14+EGFR     Other   Mental retardation   Schizophrenia ([place])   Hyperlipidemia LDL goal <130   Relevant Medications   amLODipine (NORVASC) 5 MG tablet   Other Relevant Orders   Lipid panel    Other Visit Diagnoses    Physical exam    -  Primary   Relevant Orders   CMP14+EGFR   Lipid panel       Follow up plan: Return in about 6 months (around 09/22/2017), or if symptoms worsen or fail to improve, for Hyperlipidemia and hypertension recheck.  Counseling provided for all of the vaccine components Orders Placed This Encounter  Procedures  . CMP14+EGFR  . Lipid panel    Caryl Pina, MD [facility] 03/24/2017, 3:24  PM

## 2017-03-25 ENCOUNTER — Other Ambulatory Visit: Payer: Self-pay

## 2017-03-25 DIAGNOSIS — R7309 Other abnormal glucose: Secondary | ICD-10-CM

## 2017-03-25 LAB — CMP14+EGFR
A/G RATIO: 1.6 (ref 1.2–2.2)
ALT: 14 IU/L (ref 0–44)
AST: 21 IU/L (ref 0–40)
Albumin: 4.1 g/dL (ref 3.5–4.8)
Alkaline Phosphatase: 122 IU/L — ABNORMAL HIGH (ref 39–117)
BUN/Creatinine Ratio: 15 (ref 10–24)
BUN: 19 mg/dL (ref 8–27)
Bilirubin Total: 0.3 mg/dL (ref 0.0–1.2)
CALCIUM: 9.3 mg/dL (ref 8.6–10.2)
CO2: 24 mmol/L (ref 20–29)
CREATININE: 1.24 mg/dL (ref 0.76–1.27)
Chloride: 103 mmol/L (ref 96–106)
GFR, EST AFRICAN AMERICAN: 66 mL/min/{1.73_m2} (ref >59)
GFR, EST NON AFRICAN AMERICAN: 57 mL/min/{1.73_m2} — AB (ref >59)
GLOBULIN, TOTAL: 2.6 g/dL (ref 1.5–4.5)
Glucose: 121 mg/dL — ABNORMAL HIGH (ref 65–99)
POTASSIUM: 4.4 mmol/L (ref 3.5–5.2)
SODIUM: 142 mmol/L (ref 134–144)
TOTAL PROTEIN: 6.7 g/dL (ref 6.0–8.5)

## 2017-03-25 LAB — LIPID PANEL
CHOL/HDL RATIO: 3.3 ratio (ref 0.0–5.0)
Cholesterol, Total: 125 mg/dL (ref 100–199)
HDL: 38 mg/dL — AB (ref >39)
LDL CALC: 76 mg/dL (ref 0–99)
Triglycerides: 57 mg/dL (ref 0–149)
VLDL Cholesterol Cal: 11 mg/dL (ref 5–40)

## 2017-04-26 ENCOUNTER — Other Ambulatory Visit: Payer: Self-pay | Admitting: Family Medicine

## 2017-04-28 DIAGNOSIS — Z79899 Other long term (current) drug therapy: Secondary | ICD-10-CM | POA: Diagnosis not present

## 2017-06-08 DIAGNOSIS — F319 Bipolar disorder, unspecified: Secondary | ICD-10-CM | POA: Diagnosis not present

## 2017-07-15 ENCOUNTER — Other Ambulatory Visit: Payer: Self-pay | Admitting: Family Medicine

## 2017-07-16 NOTE — Telephone Encounter (Signed)
Last seen 04/21/16

## 2017-09-07 DIAGNOSIS — F319 Bipolar disorder, unspecified: Secondary | ICD-10-CM | POA: Diagnosis not present

## 2017-09-22 ENCOUNTER — Ambulatory Visit (INDEPENDENT_AMBULATORY_CARE_PROVIDER_SITE_OTHER): Payer: Medicare Other | Admitting: Family Medicine

## 2017-09-22 ENCOUNTER — Encounter: Payer: Self-pay | Admitting: Family Medicine

## 2017-09-22 VITALS — BP 134/76 | HR 73 | Temp 97.0°F | Ht 66.25 in | Wt 183.0 lb

## 2017-09-22 DIAGNOSIS — E785 Hyperlipidemia, unspecified: Secondary | ICD-10-CM | POA: Diagnosis not present

## 2017-09-22 DIAGNOSIS — I10 Essential (primary) hypertension: Secondary | ICD-10-CM

## 2017-09-22 DIAGNOSIS — J439 Emphysema, unspecified: Secondary | ICD-10-CM | POA: Diagnosis not present

## 2017-09-22 DIAGNOSIS — N182 Chronic kidney disease, stage 2 (mild): Secondary | ICD-10-CM

## 2017-09-22 DIAGNOSIS — R7309 Other abnormal glucose: Secondary | ICD-10-CM | POA: Diagnosis not present

## 2017-09-22 LAB — BAYER DCA HB A1C WAIVED: HB A1C (BAYER DCA - WAIVED): 5 % (ref <7.0)

## 2017-09-22 MED ORDER — FLUTICASONE FUROATE-VILANTEROL 100-25 MCG/INH IN AEPB: 1.0000 | INHALATION_SPRAY | Freq: Every day | RESPIRATORY_TRACT | 5 refills | Status: DC

## 2017-09-22 NOTE — Progress Notes (Signed)
BP 134/76   Pulse 73   Temp (!) 97 F (36.1 C) (Oral)   Ht 5' 6.25" (1.683 m)   Wt 183 lb (83 kg)   BMI 29.31 kg/m    Subjective:    Patient ID: Michael Mercado, male    DOB: 1941-03-07, 77 y.o.   MRN: [id number]  HPI: Michael Mercado is a 77 y.o. male presenting on 09/22/2017 for Hyperlipidemia (6 month follow up) and Hypertension   HPI Hypertension Patient is currently on amlodipine, and their blood pressure today is 134/76. Patient denies any lightheadedness or dizziness. Patient denies headaches, blurred vision, chest pains, shortness of breath, or weakness. Denies any side effects from medication and is content with current medication.   Hyperlipidemia Patient is coming in for recheck of his hyperlipidemia. The patient is currently taking gemfibrozil. They deny any issues with myalgias or history of liver damage from it. They deny any focal numbness or weakness or chest pain.   COPD Patient is coming in for COPD recheck today.  He is currently on albuterol as needed.  He has been having coughing and wheezing spells daily and at night daily as well and she is concerned that things been worsening.  He continues to smoke around half a pack a day and has no real desire to slow down that much or quit currently.[name]  He has 7nighttime symptoms per week and 8-10daytime symptoms per week currently.   Patient has chronic kidney disease and had slightly elevated glucose on his last blood draw, will do an A1c today and we are monitoring his kidney disease.  He is currently stage II and so far is been holding level on stage II CKD.  They deny him having any difficulty making urine or other urinary issues.  Relevant past medical, surgical, family and social history reviewed and updated as indicated. Interim medical history since our last visit reviewed. Allergies and medications reviewed and updated.  Review of Systems  Constitutional: Negative for chills and fever.  HENT: Negative for  congestion, sinus pressure, sinus pain and sore throat.   Eyes: Negative for discharge.  Respiratory: Positive for cough and wheezing. Negative for chest tightness and shortness of breath.   Cardiovascular: Negative for chest pain and leg swelling.  Musculoskeletal: Negative for back pain and gait problem.  Skin: Negative for rash.  All other systems reviewed and are negative.   Per HPI unless specifically indicated above   Allergies as of 09/22/2017   No Known Allergies     Medication List        Accurate as of 09/22/17  1:24 PM. Always use your most recent med list.          AFTER BITE EX Apply topically as needed.   amLODipine 5 MG tablet Commonly known as:  NORVASC Take 1.5 tablets (7.5 mg total) by mouth daily.   aspirin 81 MG EC tablet Commonly known as:  QC LO-DOSE ASPIRIN TAKE 1 TABLET BY MOUTH ONCE DAILY. **DO NOT CRUSH**   clonazePAM 0.5 MG tablet Commonly known as:  KLONOPIN Take 0.5 mg by mouth 2 (two) times daily as needed for anxiety.   COMPLETE ALLERGY MEDICINE 25 mg capsule Generic drug:  diphenhydrAMINE TAKE 1 CAPSULE BY MOUTH DAILY AS NEEDED FOR ALLERGY SYMPTOMS. CONTACTNURSE IF SYMPTOMS WORSEN.   COPPERTONE SPORT SPF30 EX Apply topically.   gemfibrozil 600 MG tablet Commonly known as:  LOPID TAKE 1 TABLET BY MOUTH TWICE DAILY.   hydrocortisone cream 1 %  APPLY TO THE AFFECTED AREA(s) THREE TIMES DAILY.   ibuprofen 400 MG tablet Commonly known as:  ADVIL,MOTRIN Take 400 mg by mouth every 8 (eight) hours as needed.   LIP BALM BASE EX Apply topically.   loperamide 2 MG capsule Commonly known as:  IMODIUM Take by mouth as needed for diarrhea or loose stools.   loperamide 2 MG capsule Commonly known as:  IMODIUM TAKE 2 CAPSULES BY MOUTH AS NEEDED AFTER 2ND LOOSE STOOL REPEAT AFTERNEXT LOOSE STOOL MAX 3 DOSES, NOTIFY NURSE.   LUBRICATING LOTION EX Apply topically 2 (two) times daily.   MINERIN Lotn APPLY TWICE DAILY   MAPAP 500 MG  tablet Generic drug:  acetaminophen TAKE 2 TABS BY MOUTH EVERY 4 HRS AS NEEDED FOR HEADACHE/MILD TO MODERATE PAIN OR TEMP OF 100.3 & ABOVE IF TEMP UNRESOLVED AFTER 24 HRS MUST   PARoxetine 20 MG tablet Commonly known as:  PAXIL Take 20 mg by mouth daily. Take at bedtime   QC ANTACID/ANTI-GAS 200-200-20 MG/5ML suspension Generic drug:  alum & mag hydroxide-simeth TAKE 10MLS AS NEEDED FOR UPSET STOMACH. NO MORE THAN 3 DOSES IN 24 HOURS. NOTIFY NURSE IF SYMPTOMS PERSIST.   QC MILK OF MAGNESIA 400 MG/5ML suspension Generic drug:  magnesium hydroxide TAKE 30ML (2 TBLS) AFTER 3 DAYS NO BM MAY REPEAT X3 DOSES IF NO RESOLUTION CALL NURSE (CONSTIPATION)   risperiDONE 1 MG tablet Commonly known as:  RISPERDAL Take 1 mg by mouth 2 (two) times daily.   TRIPLE ANTIBIOTIC [phone] Oint MINOR CUT/ABRASION:CLEAN W/ MILD SOAP/WATER THEN APPLY THIN LAYER TO AFFECTED AREA MAY COVER W/ BANDAID REMOVE AFTER 24 HRS NOTIFY NURSE OF   TUSSIN 100 MG/5ML syrup Generic drug:  guaifenesin TAKE 10MLS (2TSP) BY MOUTH EVERY 6 HOURS AS NEEDED FOR COUGH/COLD MAX4 DOSES IN 24 HRS, NOTIFY NURSE IF PERSISTS.   VENTOLIN HFA 108 (90 Base) MCG/ACT inhaler Generic drug:  albuterol INHALE (2) PUFFS BY MOUTH 4 TIMES DAILY AS NEEDED FOR SHORTNESS OF BREATH.          Objective:    BP 134/76   Pulse 73   Temp (!) 97 F (36.1 C) (Oral)   Ht 5' 6.25" (1.683 m)   Wt 183 lb (83 kg)   BMI 29.31 kg/m   Wt Readings from Last 3 Encounters:  09/22/17 183 lb (83 kg)  03/24/17 189 lb (85.7 kg)  12/17/16 185 lb 9.6 oz (84.2 kg)    Physical Exam  Constitutional: He is oriented to person, place, and time. He appears well-developed and well-nourished. No distress.  Eyes: Conjunctivae are normal. No scleral icterus.  Neck: Neck supple. No thyromegaly present.  Cardiovascular: Normal rate, regular rhythm, normal heart sounds and intact distal pulses.  No murmur heard. Pulmonary/Chest: Effort normal and breath sounds  normal. No respiratory distress. He has no wheezes. He has no rales.  Abdominal: Soft. There is no tenderness. There is no guarding.  Musculoskeletal: Normal range of motion. He exhibits no edema.  Lymphadenopathy:    He has no cervical adenopathy.  Neurological: He is alert and oriented to person, place, and time. Coordination normal.  Skin: Skin is warm and dry. No rash noted. He is not diaphoretic.  Psychiatric: He has a normal mood and affect. His behavior is normal.  Nursing note and vitals reviewed.   Results for orders placed or performed in visit on 03/24/17  CMP14+EGFR  Result Value Ref Range   Glucose 121 (H) 65 - 99 mg/dL   BUN 19 8 -  27 mg/dL   Creatinine, Ser 1.24 0.76 - 1.27 mg/dL   GFR calc non Af Amer 57 (L) >59 mL/min/1.73   GFR calc Af Amer 66 >59 mL/min/1.73   BUN/Creatinine Ratio 15 10 - 24   Sodium 142 134 - 144 mmol/L   Potassium 4.4 3.5 - 5.2 mmol/L   Chloride 103 96 - 106 mmol/L   CO2 24 20 - 29 mmol/L   Calcium 9.3 8.6 - 10.2 mg/dL   Total Protein 6.7 6.0 - 8.5 g/dL   Albumin 4.1 3.5 - 4.8 g/dL   Globulin, Total 2.6 1.5 - 4.5 g/dL   Albumin/Globulin Ratio 1.6 1.2 - 2.2   Bilirubin Total 0.3 0.0 - 1.2 mg/dL   Alkaline Phosphatase 122 (H) 39 - 117 IU/L   AST 21 0 - 40 IU/L   ALT 14 0 - 44 IU/L  Lipid panel  Result Value Ref Range   Cholesterol, Total 125 100 - 199 mg/dL   Triglycerides 57 0 - 149 mg/dL   HDL 38 (L) >39 mg/dL   VLDL Cholesterol Cal 11 5 - 40 mg/dL   LDL Calculated 76 0 - 99 mg/dL   Chol/HDL Ratio 3.3 0.0 - 5.0 ratio      Assessment & Plan:   Problem List Items Addressed This Visit      Cardiovascular and Mediastinum   HTN (hypertension), benign - Primary   Relevant Orders   CBC with Differential/Platelet   CMP14+EGFR     Respiratory   COPD (chronic obstructive pulmonary disease) (HCC)   Relevant Medications   fluticasone furoate-vilanterol (BREO ELLIPTA) 100-25 MCG/INH AEPB   Other Relevant Orders   CBC with  Differential/Platelet     Genitourinary   CKD (chronic kidney disease), stage II   Relevant Orders   CBC with Differential/Platelet   CMP14+EGFR     Other   Hyperlipidemia LDL goal <130    Other Visit Diagnoses    Elevated glucose       Relevant Orders   CMP14+EGFR   Bayer DCA Hb A1c Waived     Will start the patient on [place] for his COPD, continue other medications as they are currently.  We will check an A1c because of his elevated glucose on the last lab draw.  Follow up plan: Return in about 3 months (around 12/23/2017), or if symptoms worsen or fail to improve, for Recheck breathing and COPD.  Counseling provided for all of the vaccine components Orders Placed This Encounter  Procedures  . CBC with Differential/Platelet  . CMP14+EGFR  . Bayer [facility] Hb A1c [place], MD [facility] 09/22/2017, 1:24 PM

## 2017-09-23 LAB — CBC WITH DIFFERENTIAL/PLATELET
BASOS ABS: 0 10*3/uL (ref 0.0–0.2)
Basos: 1 %
EOS (ABSOLUTE): 0.3 10*3/uL (ref 0.0–0.4)
Eos: 6 %
Hematocrit: 41.3 % (ref 37.5–51.0)
Hemoglobin: 13.7 g/dL (ref 13.0–17.7)
Immature Grans (Abs): 0 10*3/uL (ref 0.0–0.1)
Immature Granulocytes: 0 %
LYMPHS ABS: 1.8 10*3/uL (ref 0.7–3.1)
LYMPHS: 30 %
MCH: 29.8 pg (ref 26.6–33.0)
MCHC: 33.2 g/dL (ref 31.5–35.7)
MCV: 90 fL (ref 79–97)
Monocytes Absolute: 0.5 10*3/uL (ref 0.1–0.9)
Monocytes: 9 %
NEUTROS ABS: 3.4 10*3/uL (ref 1.4–7.0)
Neutrophils: 54 %
PLATELETS: 239 10*3/uL (ref 150–450)
RBC: 4.59 x10E6/uL (ref 4.14–5.80)
RDW: 14.7 % (ref 12.3–15.4)
WBC: 6.1 10*3/uL (ref 3.4–10.8)

## 2017-09-23 LAB — CMP14+EGFR
ALT: 12 IU/L (ref 0–44)
AST: 19 IU/L (ref 0–40)
Albumin/Globulin Ratio: 2 (ref 1.2–2.2)
Albumin: 4.2 g/dL (ref 3.5–4.8)
Alkaline Phosphatase: 113 IU/L (ref 39–117)
BUN / CREAT RATIO: 15 (ref 10–24)
BUN: 18 mg/dL (ref 8–27)
CHLORIDE: 105 mmol/L (ref 96–106)
CO2: 24 mmol/L (ref 20–29)
Calcium: 9.3 mg/dL (ref 8.6–10.2)
Creatinine, Ser: 1.18 mg/dL (ref 0.76–1.27)
GFR calc Af Amer: 69 mL/min/{1.73_m2} (ref >59)
GFR calc non Af Amer: 60 mL/min/{1.73_m2} (ref >59)
GLUCOSE: 150 mg/dL — AB (ref 65–99)
Globulin, Total: 2.1 g/dL (ref 1.5–4.5)
Potassium: 4.3 mmol/L (ref 3.5–5.2)
Sodium: 143 mmol/L (ref 134–144)
Total Protein: 6.3 g/dL (ref 6.0–8.5)

## 2017-10-22 ENCOUNTER — Other Ambulatory Visit: Payer: Self-pay | Admitting: Family Medicine

## 2017-11-24 ENCOUNTER — Encounter: Payer: Medicare Other | Admitting: *Deleted

## 2017-12-07 DIAGNOSIS — F319 Bipolar disorder, unspecified: Secondary | ICD-10-CM | POA: Diagnosis not present

## 2017-12-17 ENCOUNTER — Other Ambulatory Visit: Payer: Self-pay | Admitting: Family Medicine

## 2017-12-21 NOTE — Telephone Encounter (Signed)
Last seen 09/21/17  Dr Dettinger

## 2017-12-22 ENCOUNTER — Ambulatory Visit: Payer: Medicare Other | Admitting: Family Medicine

## 2018-01-12 ENCOUNTER — Ambulatory Visit (INDEPENDENT_AMBULATORY_CARE_PROVIDER_SITE_OTHER): Payer: Medicare Other | Admitting: Family Medicine

## 2018-01-12 ENCOUNTER — Encounter: Payer: Self-pay | Admitting: Family Medicine

## 2018-01-12 VITALS — BP 160/77 | HR 72 | Temp 97.0°F | Ht 66.25 in | Wt 176.8 lb

## 2018-01-12 DIAGNOSIS — E785 Hyperlipidemia, unspecified: Secondary | ICD-10-CM | POA: Diagnosis not present

## 2018-01-12 DIAGNOSIS — I10 Essential (primary) hypertension: Secondary | ICD-10-CM

## 2018-01-12 DIAGNOSIS — Z23 Encounter for immunization: Secondary | ICD-10-CM | POA: Diagnosis not present

## 2018-01-12 MED ORDER — AMLODIPINE BESYLATE 10 MG PO TABS: 10.0000 mg | ORAL_TABLET | Freq: Every day | ORAL | 3 refills | Status: DC

## 2018-01-12 NOTE — Patient Instructions (Signed)
Stop gemfibrozil because of myalgias  Increase amlodipine to 10 mg

## 2018-01-12 NOTE — Progress Notes (Signed)
BP (!) 160/77   Pulse 72   Temp (!) 97 F (36.1 C) (Oral)   Ht 5' 6.25" (1.683 m)   Wt 176 lb 12.8 oz (80.2 kg)   BMI 28.32 kg/m    Subjective:    Patient ID: Michael Mercado, male    DOB: 04/14/1941, 77 y.o.   MRN: [id number]  HPI: Michael Mercado is a 77 y.o. male presenting on 01/12/2018 for Hypertension (3 month follow up)   HPI Hypertension Patient is currently on amlodipine, and their blood pressure today is 160/77. Patient denies any lightheadedness or dizziness. Patient denies headaches, blurred vision, chest pains, shortness of breath, or weakness. Denies any side effects from medication and is content with current medication.   Hyperlipidemia Patient is coming in for recheck of his hyperlipidemia. The patient is currently taking gemfibrozil.  Patient is complaining of myalgias and calves when he walks, we will stop the gemfibrozil as his last triglycerides and cholesterol panel was really good. They deny any focal numbness or weakness or chest pain.   Relevant past medical, surgical, family and social history reviewed and updated as indicated. Interim medical history since our last visit reviewed. Allergies and medications reviewed and updated.  Review of Systems  Constitutional: Negative for chills and fever.  Eyes: Negative for visual disturbance.  Respiratory: Negative for shortness of breath and wheezing.   Cardiovascular: Negative for chest pain and leg swelling.  Musculoskeletal: Positive for myalgias. Negative for back pain and gait problem.  Skin: Negative for rash.  Neurological: Negative for dizziness and weakness.  All other systems reviewed and are negative.   Per HPI unless specifically indicated above   Allergies as of 01/12/2018   No Known Allergies     Medication List        Accurate as of 01/12/18  9:20 AM. Always use your most recent med list.          AFTER BITE EX Apply topically as needed.   amLODipine 10 MG  tablet Commonly known as:  NORVASC Take 1 tablet (10 mg total) by mouth daily.   aspirin 81 MG EC tablet TAKE 1 TABLET BY MOUTH ONCE DAILY. **DO NOT CRUSH**   clonazePAM 0.5 MG tablet Commonly known as:  KLONOPIN Take 0.5 mg by mouth 2 (two) times daily as needed for anxiety.   COMPLETE ALLERGY MEDICINE 25 mg capsule Generic drug:  diphenhydrAMINE TAKE 1 CAPSULE BY MOUTH DAILY AS NEEDED FOR ALLERGY SYMPTOMS. CONTACTNURSE IF SYMPTOMS WORSEN.   COPPERTONE SPORT SPF30 EX Apply topically.   fluticasone furoate-vilanterol 100-25 MCG/INH Aepb Commonly known as:  BREO ELLIPTA Inhale 1 puff into the lungs daily.   hydrocortisone cream 1 % APPLY TO THE AFFECTED AREA(s) THREE TIMES DAILY.   ibuprofen 400 MG tablet Commonly known as:  ADVIL,MOTRIN Take 400 mg by mouth every 8 (eight) hours as needed.   LIP BALM BASE EX Apply topically.   loperamide 2 MG capsule Commonly known as:  IMODIUM Take by mouth as needed for diarrhea or loose stools.   loperamide 2 MG capsule Commonly known as:  IMODIUM TAKE 2 CAPSULES BY MOUTH AS NEEDED AFTER 2ND LOOSE STOOL REPEAT AFTERNEXT LOOSE STOOL MAX 3 DOSES, NOTIFY NURSE.   LUBRICATING LOTION EX Apply topically 2 (two) times daily.   MINERIN Lotn APPLY TWICE DAILY   MAPAP 500 MG tablet Generic drug:  acetaminophen TAKE 2 TABS BY MOUTH EVERY 4 HRS AS NEEDED FOR HEADACHE/MILD TO MODERATE PAIN OR TEMP OF 100.3 &  ABOVE IF TEMP UNRESOLVED AFTER 24 HRS MUST   PARoxetine 20 MG tablet Commonly known as:  PAXIL Take 20 mg by mouth daily. Take at bedtime   QC ANTACID/ANTI-GAS 200-200-20 MG/5ML suspension Generic drug:  alum & mag hydroxide-simeth TAKE AS NEEDED FOR UPSET STOMACH. NO MORE THAN 3 DOSES IN 24 HOURS. NOTIFY NURSE IF SYMPTOMS PERSIST.   QC MILK OF MAGNESIA 400 MG/5ML suspension Generic drug:  magnesium hydroxide TAKE (2 TBLS) AFTER 3 DAYS NO BM MAY REPEAT X3 DOSES IF NO RESOLUTION CALL NURSE (CONSTIPATION)   risperiDONE  1 MG tablet Commonly known as:  RISPERDAL Take 1 mg by mouth 2 (two) times daily.   TRIPLE ANTIBIOTIC [phone] Oint MINOR CUT/ABRASION:CLEAN W/ MILD SOAP/WATER THEN APPLY THIN LAYER TO AFFECTED AREA MAY COVER W/ BANDAID REMOVE AFTER 24 HRS NOTIFY NURSE OF   TUSSIN 100 MG/5ML syrup Generic drug:  guaifenesin TAKE (2TSP) BY MOUTH EVERY 6 HOURS AS NEEDED FOR COUGH/COLD MAX4 DOSES IN 24 HRS, NOTIFY NURSE IF PERSISTS.   VENTOLIN HFA 108 (90 Base) MCG/ACT inhaler Generic drug:  albuterol INHALE (2) PUFFS BY MOUTH 4 TIMES DAILY AS NEEDED FOR SHORTNESS OF BREATH.          Objective:    BP (!) 160/77   Pulse 72   Temp (!) 97 F (36.1 C) (Oral)   Ht 5' 6.25" (1.683 m)   Wt 176 lb 12.8 oz (80.2 kg)   BMI 28.32 kg/m   Wt Readings from Last 3 Encounters:  01/12/18 176 lb 12.8 oz (80.2 kg)  09/22/17 183 lb (83 kg)  03/24/17 189 lb (85.7 kg)    Physical Exam  Constitutional: He is oriented to person, place, and time. He appears well-developed and well-nourished. No distress.  Eyes: Conjunctivae are normal. No scleral icterus.  Neck: Neck supple. No thyromegaly present.  Cardiovascular: Normal rate, regular rhythm, normal heart sounds and intact distal pulses.  No murmur heard. Pulmonary/Chest: Effort normal and breath sounds normal. No respiratory distress. He has no wheezes.  Musculoskeletal: Normal range of motion. He exhibits no edema or tenderness.  Lymphadenopathy:    He has no cervical adenopathy.  Neurological: He is alert and oriented to person, place, and time. Coordination normal.  Skin: Skin is warm and dry. No rash noted. He is not diaphoretic.  Psychiatric: He has a normal mood and affect. His behavior is normal.  Nursing note and vitals reviewed.       Assessment & Plan:   Problem List Items Addressed This Visit      Cardiovascular and Mediastinum   HTN (hypertension), benign - Primary   Relevant Medications   amLODipine (NORVASC) 10 MG tablet      Other   Hyperlipidemia LDL goal <130   Relevant Medications   amLODipine (NORVASC) 10 MG tablet    Increase his amlodipine to 10 mg, stop gemfibrozil because of myalgias.  Follow up plan: Return in about 6 months (around 07/13/2018), or if symptoms worsen or fail to improve, for Hypertension and cholesterol recheck.  Counseling provided for all of the vaccine components No orders of the defined types were placed in this encounter.   Arville Care, MD [facility] 01/12/2018, 9:20 AM

## 2018-03-07 ENCOUNTER — Other Ambulatory Visit: Payer: Self-pay | Admitting: Family Medicine

## 2018-03-08 ENCOUNTER — Other Ambulatory Visit: Payer: Self-pay | Admitting: Family Medicine

## 2018-03-09 DIAGNOSIS — F319 Bipolar disorder, unspecified: Secondary | ICD-10-CM | POA: Diagnosis not present

## 2018-05-10 ENCOUNTER — Ambulatory Visit (INDEPENDENT_AMBULATORY_CARE_PROVIDER_SITE_OTHER): Payer: Medicare Other | Admitting: *Deleted

## 2018-05-10 ENCOUNTER — Encounter: Payer: Self-pay | Admitting: *Deleted

## 2018-05-10 VITALS — BP 147/75 | HR 60 | Ht 66.25 in | Wt 177.0 lb

## 2018-05-10 DIAGNOSIS — Z1211 Encounter for screening for malignant neoplasm of colon: Secondary | ICD-10-CM

## 2018-05-10 DIAGNOSIS — Z23 Encounter for immunization: Secondary | ICD-10-CM | POA: Diagnosis not present

## 2018-05-10 DIAGNOSIS — Z0000 Encounter for general adult medical examination without abnormal findings: Secondary | ICD-10-CM

## 2018-05-10 DIAGNOSIS — Z1212 Encounter for screening for malignant neoplasm of rectum: Secondary | ICD-10-CM

## 2018-05-10 NOTE — Progress Notes (Addendum)
Subjective:   Michael Mercado is a 78 y.o. male who presents for Medicare Annual/Subsequent preventive examination.  Michael Mercado is accompanied today by [place] - a caregiver at the group home where he lives - [facility].  He enjoys going to church, doing chores around the home-mainly picking up trash.  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  [place] reports that she feels his health is slight worse this year than last because he has more aches and pains in his knees and back.  He has had no ER visits, hospitalizations, or surgeries in the past year.    Review of Systems:   All systems negative today  Cardiac Risk Factors include: advanced age (>[id number], 77>65 women);dyslipidemia;hypertension;male gender     Objective:    Vitals: BP (!) 147/75   Pulse 60   Ht 5' 6.25" (1.683 m)   Wt 177 lb (80.3 kg)   BMI 28.35 kg/m   Body mass index is 28.35 kg/m.  Advanced Directives 05/10/2018 09/23/2016  Does Patient Have a Medical Advance Directive? No No  Would patient like information on creating a medical advance directive? Yes (MAU/Ambulatory/Procedural Areas - Information given) (No Data)    Tobacco Social History   Tobacco Use  Smoking Status Current Every Day Smoker  . Packs/day: 0.33  . Types: Cigarettes  Smokeless Tobacco Never Used     Ready to quit: No Counseling given: Yes   Clinical Intake:     Pain Score: 0-No pain                 Past Medical History:  Diagnosis Date  . Hypercholesteremia   . Hypertension   . Moderate intellectual disability   . Mood disorder (HCC)   . MR (mental retardation)   . Obesity    No past surgical history on file. Family History  Problem Relation Age of Onset  . Stroke Sister    Social History   Socioeconomic History  . Marital status: Single    Spouse name: Not on file  . Number of children: Not on file  . Years of education: Not on file  . Highest education  level: Not on file  Occupational History  . Occupation: disabled  Social Needs  . Financial resource strain: Not hard at all  . Food insecurity:    Worry: Never true    Inability: Never true  . Transportation needs:    Medical: No    Non-medical: No  Tobacco Use  . Smoking status: Current Every Day Smoker    Packs/day: 0.33    Types: Cigarettes  . Smokeless tobacco: Never Used  Substance and Sexual Activity  . Alcohol use: No    Alcohol/week: 0.0 standard drinks  . Drug use: No  . Sexual activity: Never  Lifestyle  . Physical activity:    Days per week: 3 days    Minutes per session: 10 min  . Stress: Not at all  Relationships  . Social connections:    Talks on phone: Never    Gets together: More than three times a week    Attends religious service: More than 4 times per year    Active member of club or organization: Yes    Attends meetings of clubs or organizations: More than 4 times per year    Relationship status: Never married  Other Topics Concern  . Not on file  Social History Narrative  . Not on file  Outpatient Encounter Medications as of 05/10/2018  Medication Sig  . amLODipine (NORVASC) 10 MG tablet Take 1 tablet (10 mg total) by mouth daily.  [name] aspirin 81 MG EC tablet TAKE 1 TABLET BY MOUTH ONCE DAILY. **DO NOT CRUSH**  . clonazePAM (KLONOPIN) 0.5 MG tablet Take 0.5 mg by mouth 2 (two) times daily as needed for anxiety.  . COMPLETE ALLERGY MEDICINE 25 MG capsule TAKE 1 CAPSULE BY MOUTH DAILY AS NEEDED FOR ALLERGY SYMPTOMS. CONTACTNURSE IF SYMPTOMS WORSEN.  [name] Emollient (LUBRICATING LOTION EX) Apply topically 2 (two) times daily.  [name] Emollient (MINERIN) LOTN APPLY TWICE DAILY  . fluticasone furoate-vilanterol (BREO ELLIPTA) 100-25 MCG/INH AEPB Inhale 1 puff into the lungs daily.  . hydrocortisone cream 1 % APPLY TO THE AFFECTED AREA(s) THREE TIMES DAILY.  [name] ibuprofen (ADVIL,MOTRIN) 400 MG tablet Take 400 mg by mouth every 8 (eight) hours as needed.  .  loperamide (IMODIUM) 2 MG capsule Take by mouth as needed for diarrhea or loose stools.  [name] loperamide (IMODIUM) 2 MG capsule TAKE 2 CAPSULES BY MOUTH AS NEEDED AFTER 2ND LOOSE STOOL REPEAT AFTERNEXT LOOSE STOOL MAX 3 DOSES, NOTIFY NURSE.  [name] loperamide (IMODIUM) 2 MG capsule TAKE 2 CAPSULES BY MOUTH AS NEEDED AFTER 2ND LOOSE STOOL. TAKE 1 CAPSULE AFTER NEXT LOOSE STOOL MAX 3 DOSES, NOTIFY NURSE.  [name] MAPAP 500 MG tablet TAKE 2 TABS BY MOUTH EVERY 4 HRS AS NEEDED FOR HEADACHE/MILD TO MODERATE PAIN OR TEMP OF 100.3 & ABOVE IF TEMP UNRESOLVED AFTER 24 HRS MUST  . Neomycin-Bacitracin-Polymyxin (TRIPLE ANTIBIOTIC) [phone] OINT MINOR CUT/ABRASION:CLEAN W/ MILD SOAP/WATER THEN APPLY THIN LAYER TO AFFECTED AREA MAY COVER W/ BANDAID REMOVE AFTER 24 HRS NOTIFY NURSE OF  . PARoxetine (PAXIL) 20 MG tablet Take 20 mg by mouth daily. Take at bedtime  . Polyethylene Glycol (LIP BALM BASE EX) Apply topically.  . QC ANTACID/ANTI-GAS [phone] MG/5ML suspension TAKE AS NEEDED FOR UPSET STOMACH. NO MORE THAN 3 DOSES IN 24 HOURS. NOTIFY NURSE IF SYMPTOMS PERSIST.  [name] QC MILK OF MAGNESIA 400 MG/5ML suspension TAKE (2 TBLS)AS NEEDED AFTER 3 DAYS NO BM MAY REPEAT X3 DOSES IFNO RESOLUTION CALL NURSE (CONSTIPATION)  . risperiDONE (RISPERDAL) 1 MG tablet Take 1 mg by mouth 2 (two) times daily.  . Sodium Bicarbonate (AFTER BITE EX) Apply topically as needed.  . Sunscreens (COPPERTONE SPORT SPF30 EX) Apply topically.  . TUSSIN 100 MG/5ML syrup TAKE (2TSP) BY MOUTH EVERY 6 HOURS AS NEEDED FOR COUGH/COLD MAX4 DOSES IN 24 HRS, NOTIFY NURSE IF PERSISTS.  [name] VENTOLIN HFA 108 (90 Base) MCG/ACT inhaler INHALE (2) PUFFS BY MOUTH 4 TIMES DAILY AS NEEDED FOR SHORTNESS OF BREATH.   No facility-administered encounter medications on file as of 05/10/2018.     Activities of Daily Living In your present state of health, do you have any difficulty performing the following activities: 05/10/2018  Hearing? N  Vision? Y    Comment Does not see well even with glasses, goes for eye exams yearly  Difficulty concentrating or making decisions? Y  Comment due to mental retardation  Walking or climbing stairs? Y  Dressing or bathing? Y  Doing errands, shopping? N  Comment Transportation provided by group home  Quarry manager and eating ? Y  Comment Prepared by Group home  Using the Toilet? N  In the past six months, have you accidently leaked urine? N  Do you have problems with loss of bowel control? N  Managing your Medications? N  Managing  your Finances? N  Housekeeping or managing your Housekeeping? N  Some recent data might be hidden    Patient Care Team: Dettinger, Elige Radon, MD as PCP - General (Family Medicine) Salomon Mast, MD as Consulting Physician (Nephrology) Antonietta Breach, MD as Referring Physician (Neurology)   Assessment:   This is a routine wellness examination for Michael Mercado.  Exercise Activities and Dietary recommendations  Mr. Dyar and caregiver states he eats 3 meals per day and 1-2 snacks.  All meals are prepared by group home employees, and they go out to eat approximately once per month.  He states he eats oatmeal frequently for breakfast, [place] sandwich and chips for lunch, and meat and vegetables for supper. Recommended a diet of mostly non-starchy vegetables, fruits, whole grains, and lean proteins.  Patient has access to all the food he needs at the group home.   Current Exercise Habits: Home exercise routine, Type of exercise: walking, Time (Minutes): 10, Frequency (Times/Week): 3, Weekly Exercise (Minutes/Week): 30, Intensity: Mild, Exercise limited by: respiratory conditions(s)  Goals    . Exercise daily  (15 min per time)     Walk for 15 minutes daily.        Fall Risk Fall Risk  05/10/2018 01/12/2018 09/22/2017 12/17/2016 12/10/2016  Falls in the past year? 0 No No No No  Number falls in past yr: - - - - -  Injury with Fall? - - - - -  Comment - - - - -   Is  the patient's home free of loose throw rugs in walkways, pet beds, electrical cords, etc?   yes      Grab bars in the bathroom? yes      Handrails on the stairs?   no stairs in home      Adequate lighting?   yes    Depression Screen PHQ 2/9 Scores 05/10/2018 01/12/2018 09/22/2017 03/24/2017  PHQ - 2 Score 1 0 0 0  PHQ- 9 Score - - - -  Exception Documentation - - - -   Completed depression screening with the assistance of [name] caregiver from group home   Cognitive Function MMSE - Mini Mental State Exam 05/10/2018 09/23/2016  Not completed: Unable to complete Unable to complete        Immunization History  Administered Date(s) Administered  . Influenza, High Dose Seasonal PF 01/21/2017, 01/12/2018  . Influenza,inj,Quad PF,6+ Mos 03/18/2016  . Pneumococcal Conjugate-13 05/10/2018  . Tdap 09/23/2016    Qualifies for Shingles Vaccine? Yes, declined today  Screening Tests Health Maintenance  Topic Date Due  . PNA vac Low Risk Adult (1 of 2 - PCV13) 03/26/2007  . TETANUS/TDAP  09/24/2026  . INFLUENZA VACCINE  Completed   Cancer Screenings: Lung: Low Dose CT Chest recommended if Age 34-80 years, 30 pack-year currently smoking OR have quit w/in 15years. Patient does qualify. Colorectal: Cologuard ordered  Additional Screenings:  Hepatitis C Screening: Not indicated      Plan:     Work on your goal of increasing your exercise to 15 minutes of walking per day.  Review the information given on Advance Directives.  If you complete the paperwork, please bring a copy to our office to be filed in your medical record.  Follow up with Dr. Louanne Skye as scheduled. Complete Cologuard test and send back to [facility] for testing.    I have personally reviewed and noted the following in the patient's chart:   . Medical and social history . Use of  alcohol, tobacco or illicit drugs  . Current medications and supplements . Functional ability and status . Nutritional  status . Physical activity . Advanced directives . List of other physicians . Hospitalizations, surgeries, and ER visits in previous 12 months . Vitals . Screenings to include cognitive, depression, and falls . Referrals and appointments  In addition, I have reviewed and discussed with patient certain preventive protocols, quality metrics, and best practice recommendations. A written personalized care plan for preventive services as well as general preventive health recommendations were provided to patient.     Leshonda Galambos M, RN  05/10/2018  I have reviewed and agree with the above AWV documentation.   Jannifer Rodneyhristy Hawks, FNP

## 2018-05-10 NOTE — Patient Instructions (Addendum)
Please work on your goal of increasing your exercise to 15 minutes of walking per day.   Please review the information given on Advance Directives.  If you complete the paperwork, please bring a copy to our office to be filed in your medical record.   Please follow up with Dr. Warrick Parisian as scheduled.   Thank you for coming in for your Annual Wellness Visit today!   Your doctor has prescribed Cologuard, an easy-to-use, noninvasive test for colon cancer screening, based on the latest advances in stool DNA science.   Here's what will happen next:  1. You may receive a call or email from [facility] to confirm your mailing address and insurance information 2. Your kit will be shipped directly to you 3. You collect your stool sample in the privacy of your own home 4. You return the kit via [facility] shipping or pick-up, in the same box it arrived in 5. You doctor will contact you with the results once they are available  Screening for colon cancer is very important to your good health, so if you have any questions at all, please call [employer]'s Customer Support Specialists at [phone]. They are available 24 hours a day, 6 days a week.   Thank you for coming in for your Annual Wellness Visit today!    Preventive Care 70 Years and Older, Male Preventive care refers to lifestyle choices and visits with your health care provider that can promote health and wellness. What does preventive care include?   A yearly physical exam. This is also called an annual well check.  Dental exams once or twice a year.  Routine eye exams. Ask your health care provider how often you should have your eyes checked.  Personal lifestyle choices, including: ? Daily care of your teeth and gums. ? Regular physical activity. ? Eating a healthy diet. ? Avoiding tobacco and drug use. ? Limiting alcohol use. ? Practicing safe sex. ? Taking low doses of aspirin every day. ? Taking vitamin  and mineral supplements as recommended by your health care provider. What happens during an annual well check? The services and screenings done by your health care provider during your annual well check will depend on your age, overall health, lifestyle risk factors, and family history of disease. Counseling Your health care provider may ask you questions about your:  Alcohol use.  Tobacco use.  Drug use.  Emotional well-being.  Home and relationship well-being.  Sexual activity.  Eating habits.  History of falls.  Memory and ability to understand (cognition).  Work and work Statistician. Screening You may have the following tests or measurements:  Height, weight, and BMI.  Blood pressure.  Lipid and cholesterol levels. These may be checked every 5 years, or more frequently if you are over 38 years old.  Skin check.  Lung cancer screening. You may have this screening every year starting at age 16 if you have a 30-pack-year history of smoking and currently smoke or have quit within the past 15 years.  Colorectal cancer screening. All adults should have this screening starting at age 84 and continuing until age 29. You will have tests every 1-10 years, depending on your results and the type of screening test. People at increased risk should start screening at an earlier age. Screening tests may include: ? Guaiac-based fecal occult blood testing. ? Fecal immunochemical test (FIT). ? Stool DNA test. ? Virtual colonoscopy. ? Sigmoidoscopy. During this test, a flexible tube with a  tiny camera (sigmoidoscope) is used to examine your rectum and lower colon. The sigmoidoscope is inserted through your anus into your rectum and lower colon. ? Colonoscopy. During this test, a long, thin, flexible tube with a tiny camera (colonoscope) is used to examine your entire colon and rectum.  Prostate cancer screening. Recommendations will vary depending on your family history and other  risks.  Hepatitis C blood test.  Hepatitis B blood test.  Sexually transmitted disease (STD) testing.  Diabetes screening. This is done by checking your blood sugar (glucose) after you have not eaten for a while (fasting). You may have this done every 1-3 years.  Abdominal aortic aneurysm (AAA) screening. You may need this if you are a current or former smoker.  Osteoporosis. You may be screened starting at age 77 if you are at high risk. Talk with your health care provider about your test results, treatment options, and if necessary, the need for more tests. Vaccines Your health care provider may recommend certain vaccines, such as:  Influenza vaccine. This is recommended every year.  Tetanus, diphtheria, and acellular pertussis (Tdap, Td) vaccine. You may need a Td booster every 10 years.  Varicella vaccine. You may need this if you have not been vaccinated.  Zoster vaccine. You may need this after age 17.  Measles, mumps, and rubella (MMR) vaccine. You may need at least one dose of MMR if you were born in 1957 or later. You may also need a second dose.  Pneumococcal 13-valent conjugate (PCV13) vaccine. One dose is recommended after age 44.  Pneumococcal polysaccharide (PPSV23) vaccine. One dose is recommended after age 73.  Meningococcal vaccine. You may need this if you have certain conditions.  Hepatitis A vaccine. You may need this if you have certain conditions or if you travel or work in places where you may be exposed to hepatitis A.  Hepatitis B vaccine. You may need this if you have certain conditions or if you travel or work in places where you may be exposed to hepatitis B.  Haemophilus influenzae type b (Hib) vaccine. You may need this if you have certain risk factors. Talk to your health care provider about which screenings and vaccines you need and how often you need them. This information is not intended to replace advice given to you by your health care  provider. Make sure you discuss any questions you have with your health care provider. Document Released: 05/03/2015 Document Revised: 05/27/2017 Document Reviewed: 02/05/2015 Elsevier Interactive Patient Education  2019 [employer].

## 2018-06-07 DIAGNOSIS — F319 Bipolar disorder, unspecified: Secondary | ICD-10-CM | POA: Diagnosis not present

## 2018-06-21 ENCOUNTER — Ambulatory Visit (INDEPENDENT_AMBULATORY_CARE_PROVIDER_SITE_OTHER): Payer: Medicare Other | Admitting: Family Medicine

## 2018-06-21 ENCOUNTER — Encounter: Payer: Self-pay | Admitting: Family Medicine

## 2018-06-21 VITALS — BP 129/73 | HR 60 | Temp 97.0°F | Ht 66.25 in | Wt 176.4 lb

## 2018-06-21 DIAGNOSIS — N182 Chronic kidney disease, stage 2 (mild): Secondary | ICD-10-CM

## 2018-06-21 DIAGNOSIS — I10 Essential (primary) hypertension: Secondary | ICD-10-CM | POA: Diagnosis not present

## 2018-06-21 DIAGNOSIS — J439 Emphysema, unspecified: Secondary | ICD-10-CM

## 2018-06-21 DIAGNOSIS — E785 Hyperlipidemia, unspecified: Secondary | ICD-10-CM

## 2018-06-21 NOTE — Progress Notes (Signed)
BP 129/73   Pulse 60   Temp (!) 97 F (36.1 C) (Oral)   Ht 5' 6.25" (1.683 m)   Wt 176 lb 6.4 oz (80 kg)   BMI 28.26 kg/m    Subjective:    Patient ID: Michael Mercado, male    DOB: March 14, 1941, 78 y.o.   MRN: [id number]  HPI: Michael Mercado is a 78 y.o. male presenting on 06/21/2018 for Hypertension (6 month follow up) and Hyperlipidemia   HPI Hypertension Patient is currently on amlodipine, and their blood pressure today is 129/73. Patient denies any lightheadedness or dizziness. Patient denies headaches, blurred vision, chest pains, shortness of breath, or weakness. Denies any side effects from medication and is content with current medication.  Patient has CKD stage II and we will recheck that today, has been stable.  Hyperlipidemia Patient is coming in for recheck of his hyperlipidemia. The patient is currently taking no medication currently we have been monitoring and has been doing good. They deny any issues with myalgias or history of liver damage from it. They deny any focal numbness or weakness or chest pain.     COPD Patient is coming in for COPD recheck today.  He is currently on Brio and albuterol.  He has a mild chronic cough but denies any major coughing spells or wheezing spells.  He has 0nighttime symptoms per week and 0daytime symptoms per week currently.   Relevant past medical, surgical, family and social history reviewed and updated as indicated. Interim medical history since our last visit reviewed. Allergies and medications reviewed and updated.  Review of Systems  Constitutional: Negative for chills and fever.  Eyes: Negative for visual disturbance.  Respiratory: Negative for shortness of breath and wheezing.   Cardiovascular: Negative for chest pain and leg swelling.  Musculoskeletal: Negative for back pain and gait problem.  Skin: Negative for rash.  Neurological: Negative for dizziness, weakness, light-headedness, numbness and headaches.  All  other systems reviewed and are negative.   Per HPI unless specifically indicated above   Allergies as of 06/21/2018   No Known Allergies     Medication List       Accurate as of June 21, 2018  9:41 AM. Always use your most recent med list.        AFTER BITE EX Apply topically as needed.   amLODipine 10 MG tablet Commonly known as:  NORVASC Take 1 tablet (10 mg total) by mouth daily.   aspirin 81 MG EC tablet TAKE 1 TABLET BY MOUTH ONCE DAILY. **DO NOT CRUSH**   clonazePAM 0.5 MG tablet Commonly known as:  KLONOPIN Take 0.5 mg by mouth 2 (two) times daily as needed for anxiety.   COMPLETE ALLERGY MEDICINE 25 mg capsule Generic drug:  diphenhydrAMINE TAKE 1 CAPSULE BY MOUTH DAILY AS NEEDED FOR ALLERGY SYMPTOMS. CONTACTNURSE IF SYMPTOMS WORSEN.   COPPERTONE SPORT SPF30 EX Apply topically.   fluticasone furoate-vilanterol 100-25 MCG/INH Aepb Commonly known as:  BREO ELLIPTA Inhale 1 puff into the lungs daily.   hydrocortisone cream 1 % APPLY TO THE AFFECTED AREA(s) THREE TIMES DAILY.   ibuprofen 400 MG tablet Commonly known as:  ADVIL,MOTRIN Take 400 mg by mouth every 8 (eight) hours as needed.   LIP BALM BASE EX Apply topically.   loperamide 2 MG capsule Commonly known as:  IMODIUM Take by mouth as needed for diarrhea or loose stools.   loperamide 2 MG capsule Commonly known as:  IMODIUM TAKE 2 CAPSULES BY MOUTH  AS NEEDED AFTER 2ND LOOSE STOOL REPEAT AFTERNEXT LOOSE STOOL MAX 3 DOSES, NOTIFY NURSE.   loperamide 2 MG capsule Commonly known as:  IMODIUM TAKE 2 CAPSULES BY MOUTH AS NEEDED AFTER 2ND LOOSE STOOL. TAKE 1 CAPSULE AFTER NEXT LOOSE STOOL MAX 3 DOSES, NOTIFY NURSE.   LUBRICATING LOTION EX Apply topically 2 (two) times daily.   MINERIN Lotn APPLY TWICE DAILY   MAPAP 500 MG tablet Generic drug:  acetaminophen TAKE 2 TABS BY MOUTH EVERY 4 HRS AS NEEDED FOR HEADACHE/MILD TO MODERATE PAIN OR TEMP OF 100.3 & ABOVE IF TEMP UNRESOLVED AFTER 24 HRS  MUST   PARoxetine 20 MG tablet Commonly known as:  PAXIL Take 20 mg by mouth daily. Take at bedtime   QC ANTACID/ANTI-GAS 200-200-20 MG/5ML suspension Generic drug:  alum & mag hydroxide-simeth TAKE 10MLS AS NEEDED FOR UPSET STOMACH. NO MORE THAN 3 DOSES IN 24 HOURS. NOTIFY NURSE IF SYMPTOMS PERSIST.   QC MILK OF MAGNESIA 400 MG/5ML suspension Generic drug:  magnesium hydroxide TAKE 30ML (2 TBLS)AS NEEDED AFTER 3 DAYS NO BM MAY REPEAT X3 DOSES IFNO RESOLUTION CALL NURSE (CONSTIPATION)   risperiDONE 1 MG tablet Commonly known as:  RISPERDAL Take 1 mg by mouth 2 (two) times daily.   TRIPLE ANTIBIOTIC [id number] Oint MINOR CUT/ABRASION:CLEAN W/ MILD SOAP/WATER THEN APPLY THIN LAYER TO AFFECTED AREA MAY COVER W/ BANDAID REMOVE AFTER 24 HRS NOTIFY NURSE OF   TUSSIN 100 MG/5ML syrup Generic drug:  guaifenesin TAKE 10MLS (2TSP) BY MOUTH EVERY 6 HOURS AS NEEDED FOR COUGH/COLD MAX4 DOSES IN 24 HRS, NOTIFY NURSE IF PERSISTS.   VENTOLIN HFA 108 (90 Base) MCG/ACT inhaler Generic drug:  albuterol INHALE (2) PUFFS BY MOUTH 4 TIMES DAILY AS NEEDED FOR SHORTNESS OF BREATH.          Objective:    BP 129/73   Pulse 60   Temp (!) 97 F (36.1 C) (Oral)   Ht 5' 6.25" (1.683 m)   Wt 176 lb 6.4 oz (80 kg)   BMI 28.26 kg/m   Wt Readings from Last 3 Encounters:  06/21/18 176 lb 6.4 oz (80 kg)  05/10/18 177 lb (80.3 kg)  01/12/18 176 lb 12.8 oz (80.2 kg)    Physical Exam Vitals signs and nursing note reviewed.  Constitutional:      General: He is not in acute distress.    Appearance: He is well-developed. He is not diaphoretic.  Eyes:     General: No scleral icterus.    Conjunctiva/sclera: Conjunctivae normal.  Neck:     Musculoskeletal: Neck supple.     Thyroid: No thyromegaly.  Cardiovascular:     Rate and Rhythm: Normal rate and regular rhythm.     Heart sounds: Normal heart sounds. No murmur.  Pulmonary:     Effort: Pulmonary effort is normal. No respiratory distress.      Breath sounds: Normal breath sounds. No wheezing.  Musculoskeletal: Normal range of motion.  Lymphadenopathy:     Cervical: No cervical adenopathy.  Skin:    General: Skin is warm and dry.     Findings: No rash.  Neurological:     Mental Status: He is alert and oriented to person, place, and time.     Coordination: Coordination normal.  Psychiatric:        Behavior: Behavior normal.         Assessment & Plan:   Problem List Items Addressed This Visit      Cardiovascular and Mediastinum   HTN (hypertension),  benign   Relevant Orders   CMP14+EGFR     Respiratory   COPD (chronic obstructive pulmonary disease) (HCC)     Genitourinary   CKD (chronic kidney disease), stage II   Relevant Orders   CBC with Differential/Platelet   CMP14+EGFR     Other   Hyperlipidemia LDL goal <130 - Primary   Relevant Orders   Lipid panel      Continue current medications, will check blood work today, see back in 6 months, sounds like he is doing well with no changes needed right now.  He does still see psychiatry for schizophrenia and Dr. Tamera Punt Follow up plan: Return in about 6 months (around 12/22/2018), or if symptoms worsen or fail to improve, for Hypertension cholesterol.  Counseling provided for all of the vaccine components Orders Placed This Encounter  Procedures  . CBC with Differential/Platelet  . CMP14+EGFR  . Lipid panel    Caryl Pina, MD [facility] 06/21/2018, 9:41 AM

## 2018-06-22 ENCOUNTER — Other Ambulatory Visit: Payer: Self-pay | Admitting: Family Medicine

## 2018-06-22 DIAGNOSIS — J439 Emphysema, unspecified: Secondary | ICD-10-CM

## 2018-06-22 LAB — LIPID PANEL
CHOLESTEROL TOTAL: 125 mg/dL (ref 100–199)
Chol/HDL Ratio: 3.7 ratio (ref 0.0–5.0)
HDL: 34 mg/dL — ABNORMAL LOW (ref >39)
LDL Calculated: 77 mg/dL (ref 0–99)
TRIGLYCERIDES: 70 mg/dL (ref 0–149)
VLDL Cholesterol Cal: 14 mg/dL (ref 5–40)

## 2018-06-22 LAB — CBC WITH DIFFERENTIAL/PLATELET
BASOS: 1 %
Basophils Absolute: 0 10*3/uL (ref 0.0–0.2)
EOS (ABSOLUTE): 0.2 10*3/uL (ref 0.0–0.4)
EOS: 3 %
HEMATOCRIT: 40.2 % (ref 37.5–51.0)
Hemoglobin: 13.5 g/dL (ref 13.0–17.7)
IMMATURE GRANS (ABS): 0 10*3/uL (ref 0.0–0.1)
IMMATURE GRANULOCYTES: 0 %
LYMPHS: 21 %
Lymphocytes Absolute: 1.4 10*3/uL (ref 0.7–3.1)
MCH: 29.7 pg (ref 26.6–33.0)
MCHC: 33.6 g/dL (ref 31.5–35.7)
MCV: 89 fL (ref 79–97)
Monocytes Absolute: 0.5 10*3/uL (ref 0.1–0.9)
Monocytes: 8 %
NEUTROS PCT: 67 %
Neutrophils Absolute: 4.4 10*3/uL (ref 1.4–7.0)
PLATELETS: 206 10*3/uL (ref 150–450)
RBC: 4.54 x10E6/uL (ref 4.14–5.80)
RDW: 13.6 % (ref 11.6–15.4)
WBC: 6.5 10*3/uL (ref 3.4–10.8)

## 2018-06-22 LAB — CMP14+EGFR
A/G RATIO: 1.8 (ref 1.2–2.2)
ALT: 12 IU/L (ref 0–44)
AST: 19 IU/L (ref 0–40)
Albumin: 4.2 g/dL (ref 3.7–4.7)
Alkaline Phosphatase: 110 IU/L (ref 39–117)
BUN/Creatinine Ratio: 16 (ref 10–24)
BUN: 19 mg/dL (ref 8–27)
Bilirubin Total: 0.4 mg/dL (ref 0.0–1.2)
CALCIUM: 9.2 mg/dL (ref 8.6–10.2)
CO2: 24 mmol/L (ref 20–29)
Chloride: 102 mmol/L (ref 96–106)
Creatinine, Ser: 1.18 mg/dL (ref 0.76–1.27)
GFR calc Af Amer: 69 mL/min/{1.73_m2} (ref >59)
GFR calc non Af Amer: 60 mL/min/{1.73_m2} (ref >59)
GLOBULIN, TOTAL: 2.3 g/dL (ref 1.5–4.5)
Glucose: 94 mg/dL (ref 65–99)
POTASSIUM: 4.4 mmol/L (ref 3.5–5.2)
SODIUM: 137 mmol/L (ref 134–144)
TOTAL PROTEIN: 6.5 g/dL (ref 6.0–8.5)

## 2018-07-06 ENCOUNTER — Emergency Department (HOSPITAL_COMMUNITY)
Admission: EM | Admit: 2018-07-06 | Discharge: 2018-07-06 | Disposition: A | Discharge status: Home / Self Care | Payer: Medicare Other | Source: Home / Self Care | Attending: Emergency Medicine | Admitting: Emergency Medicine

## 2018-07-06 ENCOUNTER — Encounter (HOSPITAL_COMMUNITY): Payer: Self-pay

## 2018-07-06 ENCOUNTER — Other Ambulatory Visit: Payer: Self-pay

## 2018-07-06 DIAGNOSIS — I10 Essential (primary) hypertension: Secondary | ICD-10-CM | POA: Diagnosis not present

## 2018-07-06 DIAGNOSIS — Z7982 Long term (current) use of aspirin: Secondary | ICD-10-CM

## 2018-07-06 DIAGNOSIS — Z79899 Other long term (current) drug therapy: Secondary | ICD-10-CM | POA: Insufficient documentation

## 2018-07-06 DIAGNOSIS — I129 Hypertensive chronic kidney disease with stage 1 through stage 4 chronic kidney disease, or unspecified chronic kidney disease: Secondary | ICD-10-CM

## 2018-07-06 DIAGNOSIS — Z4682 Encounter for fitting and adjustment of non-vascular catheter: Secondary | ICD-10-CM | POA: Diagnosis not present

## 2018-07-06 DIAGNOSIS — S065X0A Traumatic subdural hemorrhage without loss of consciousness, initial encounter: Secondary | ICD-10-CM | POA: Diagnosis not present

## 2018-07-06 DIAGNOSIS — W19XXXA Unspecified fall, initial encounter: Secondary | ICD-10-CM | POA: Diagnosis not present

## 2018-07-06 DIAGNOSIS — F209 Schizophrenia, unspecified: Secondary | ICD-10-CM | POA: Diagnosis not present

## 2018-07-06 DIAGNOSIS — G935 Compression of brain: Secondary | ICD-10-CM | POA: Diagnosis not present

## 2018-07-06 DIAGNOSIS — W182XXA Fall in (into) shower or empty bathtub, initial encounter: Secondary | ICD-10-CM

## 2018-07-06 DIAGNOSIS — W0110XA Fall on same level from slipping, tripping and stumbling with subsequent striking against unspecified object, initial encounter: Secondary | ICD-10-CM | POA: Insufficient documentation

## 2018-07-06 DIAGNOSIS — R464 Slowness and poor responsiveness: Secondary | ICD-10-CM | POA: Diagnosis not present

## 2018-07-06 DIAGNOSIS — N182 Chronic kidney disease, stage 2 (mild): Secondary | ICD-10-CM

## 2018-07-06 DIAGNOSIS — Y92002 Bathroom of unspecified non-institutional (private) residence single-family (private) house as the place of occurrence of the external cause: Secondary | ICD-10-CM | POA: Insufficient documentation

## 2018-07-06 DIAGNOSIS — F17210 Nicotine dependence, cigarettes, uncomplicated: Secondary | ICD-10-CM

## 2018-07-06 DIAGNOSIS — Y999 Unspecified external cause status: Secondary | ICD-10-CM | POA: Insufficient documentation

## 2018-07-06 DIAGNOSIS — F79 Unspecified intellectual disabilities: Secondary | ICD-10-CM | POA: Insufficient documentation

## 2018-07-06 DIAGNOSIS — J449 Chronic obstructive pulmonary disease, unspecified: Secondary | ICD-10-CM | POA: Insufficient documentation

## 2018-07-06 DIAGNOSIS — S062X0A Diffuse traumatic brain injury without loss of consciousness, initial encounter: Secondary | ICD-10-CM | POA: Diagnosis not present

## 2018-07-06 DIAGNOSIS — M5489 Other dorsalgia: Secondary | ICD-10-CM | POA: Diagnosis not present

## 2018-07-06 DIAGNOSIS — S0990XA Unspecified injury of head, initial encounter: Secondary | ICD-10-CM | POA: Insufficient documentation

## 2018-07-06 DIAGNOSIS — G8191 Hemiplegia, unspecified affecting right dominant side: Secondary | ICD-10-CM | POA: Diagnosis not present

## 2018-07-06 DIAGNOSIS — Y93E8 Activity, other personal hygiene: Secondary | ICD-10-CM | POA: Insufficient documentation

## 2018-07-06 DIAGNOSIS — R4701 Aphasia: Secondary | ICD-10-CM | POA: Diagnosis not present

## 2018-07-06 DIAGNOSIS — Z9911 Dependence on respirator [ventilator] status: Secondary | ICD-10-CM | POA: Diagnosis not present

## 2018-07-06 DIAGNOSIS — R29720 NIHSS score 20: Secondary | ICD-10-CM | POA: Diagnosis not present

## 2018-07-06 DIAGNOSIS — R4182 Altered mental status, unspecified: Secondary | ICD-10-CM | POA: Diagnosis not present

## 2018-07-06 DIAGNOSIS — I639 Cerebral infarction, unspecified: Secondary | ICD-10-CM | POA: Diagnosis not present

## 2018-07-06 DIAGNOSIS — H510 Palsy (spasm) of conjugate gaze: Secondary | ICD-10-CM | POA: Diagnosis not present

## 2018-07-06 MED ORDER — BACITRACIN ZINC 500 UNIT/GM EX OINT
1.0000 "application " | TOPICAL_OINTMENT | Freq: Once | CUTANEOUS | Status: AC
  Administered 2018-07-06: 1 via TOPICAL
  Filled 2018-07-06: qty 0.9

## 2018-07-06 NOTE — ED Notes (Signed)
Bandaid applied to abrasion on top of head.

## 2018-07-06 NOTE — ED Provider Notes (Signed)
[facility] EMERGENCY DEPARTMENT Provider Note   CSN: [id number] Arrival date & time: 07/06/18  2217    History   Chief Complaint Chief Complaint  Patient presents with   Fall    HPI Michael Mercado is a 78 y.o. male.     HPI Patient presents to the emergency room for evaluation of a fall.  Patient has a history of MR and resides in a group home.  Patient slipped when he was getting out of the shower this evening.  He struck his head.  The facility sent him to the ED for evaluation.  Patient denies any loss of consciousness.  He denies any headache.  He denies any neck pain.  No pain in his extremities.  No chest pain or shortness of breath.  No numbness or weakness. Past Medical History:  Diagnosis Date   Hypercholesteremia    Hypertension    Moderate intellectual disability    Mood disorder (HCC)    MR (mental retardation)    Obesity     Patient Active Problem List   Diagnosis Date Noted   CKD (chronic kidney disease), stage II 03/18/2016   HTN (hypertension), benign 03/18/2015   COPD (chronic obstructive pulmonary disease) (HCC) 03/18/2015   Mental retardation 03/18/2015   Schizophrenia (HCC) 03/18/2015   Hyperlipidemia LDL goal <130 03/18/2015    History reviewed. No pertinent surgical history.      Home Medications    Prior to Admission medications   Medication Sig Start Date End Date Taking? Authorizing Provider  amLODipine (NORVASC) 10 MG tablet Take 1 tablet (10 mg total) by mouth daily. 01/12/18   Dettinger, Elige Radon, MD  aspirin 81 MG EC tablet TAKE 1 TABLET BY MOUTH ONCE DAILY. **DO NOT CRUSH** 10/25/17   Dettinger, Elige Radon, MD  BREO ELLIPTA 100-25 MCG/INH AEPB INHALE 1 PUFF ONCE DAILY. 06/23/18   Dettinger, Elige Radon, MD  clonazePAM (KLONOPIN) 0.5 MG tablet Take 0.5 mg by mouth 2 (two) times daily as needed for anxiety.    [provider]  COMPLETE ALLERGY MEDICINE 25 MG capsule TAKE 1 CAPSULE BY MOUTH DAILY AS NEEDED FOR ALLERGY  SYMPTOMS. CONTACTNURSE IF SYMPTOMS WORSEN. 03/08/18   Dettinger, Elige Radon, MD  Emollient (LUBRICATING LOTION EX) Apply topically 2 (two) times daily.    [provider]  Emollient (MINERIN) LOTN APPLY TWICE DAILY 07/16/17   Dettinger, Elige Radon, MD  hydrocortisone cream 1 % APPLY TO THE AFFECTED AREA(s) THREE TIMES DAILY. 03/02/17   Dettinger, Elige Radon, MD  ibuprofen (ADVIL,MOTRIN) 400 MG tablet Take 400 mg by mouth every 8 (eight) hours as needed.    [provider]  loperamide (IMODIUM) 2 MG capsule Take by mouth as needed for diarrhea or loose stools.    [provider]  loperamide (IMODIUM) 2 MG capsule TAKE 2 CAPSULES BY MOUTH AS NEEDED AFTER 2ND LOOSE STOOL REPEAT AFTERNEXT LOOSE STOOL MAX 3 DOSES, NOTIFY NURSE. 03/02/17   Dettinger, Elige Radon, MD  loperamide (IMODIUM) 2 MG capsule TAKE 2 CAPSULES BY MOUTH AS NEEDED AFTER 2ND LOOSE STOOL. TAKE 1 CAPSULE AFTER NEXT LOOSE STOOL MAX 3 DOSES, NOTIFY NURSE. 03/08/18   Dettinger, Elige Radon, MD  MAPAP 500 MG tablet TAKE 2 TABS BY MOUTH EVERY 4 HRS AS NEEDED FOR HEADACHE/MILD TO MODERATE PAIN OR TEMP OF 100.3 & ABOVE IF TEMP UNRESOLVED AFTER 24 HRS MUST 12/21/17   Dettinger, Elige Radon, MD  Neomycin-Bacitracin-Polymyxin (TRIPLE ANTIBIOTIC) [phone] OINT MINOR CUT/ABRASION:CLEAN W/ MILD SOAP/WATER THEN APPLY THIN  LAYER TO AFFECTED AREA MAY COVER W/ BANDAID REMOVE AFTER 24 HRS NOTIFY NURSE OF 03/02/17   Dettinger, Elige Radon, MD  PARoxetine (PAXIL) 20 MG tablet Take 20 mg by mouth daily. Take at bedtime    [provider]  Polyethylene Glycol (LIP BALM BASE EX) Apply topically.    [provider]  QC ANTACID/ANTI-GAS [phone] MG/5ML suspension TAKE AS NEEDED FOR UPSET STOMACH. NO MORE THAN 3 DOSES IN 24 HOURS. NOTIFY NURSE IF SYMPTOMS PERSIST. 03/02/17   Dettinger, Elige Radon, MD  QC MILK OF MAGNESIA 400 MG/5ML suspension TAKE (2 TBLS)AS NEEDED AFTER 3 DAYS NO BM MAY REPEAT X3 DOSES IFNO RESOLUTION CALL  NURSE (CONSTIPATION) 03/09/18   Dettinger, Elige Radon, MD  risperiDONE (RISPERDAL) 1 MG tablet Take 1 mg by mouth 2 (two) times daily.    [provider]  Sodium Bicarbonate (AFTER BITE EX) Apply topically as needed.    [provider]  Sunscreens (COPPERTONE SPORT SPF30 EX) Apply topically.    [provider]  TUSSIN 100 MG/5ML syrup TAKE (2TSP) BY MOUTH EVERY 6 HOURS AS NEEDED FOR COUGH/COLD MAX4 DOSES IN 24 HRS, NOTIFY NURSE IF PERSISTS. 07/16/17   Dettinger, Elige Radon, MD  VENTOLIN HFA 108 (90 Base) MCG/ACT inhaler INHALE (2) PUFFS BY MOUTH 4 TIMES DAILY AS NEEDED FOR SHORTNESS OF BREATH. 03/02/17   Dettinger, Elige Radon, MD    Family History Family History  Problem Relation Age of Onset   Stroke Sister     Social History Social History   Tobacco Use   Smoking status: Current Every Day Smoker    Packs/day: 0.33    Types: Cigarettes   Smokeless tobacco: Never Used  Substance Use Topics   Alcohol use: No    Alcohol/week: 0.0 standard drinks   Drug use: No     Allergies   Patient has no known allergies.   Review of Systems Review of Systems  All other systems reviewed and are negative.    Physical Exam Updated Vital Signs BP (!) 152/85 (BP Location: Right Arm)    Pulse 71    Temp (!) 97.4 F (36.3 C) (Oral)    Resp 17    Ht 1.683 m (5' 6.25")    Wt 80 kg    SpO2 98%    BMI 28.26 kg/m   Physical Exam Vitals signs and nursing note reviewed.  Constitutional:      General: He is not in acute distress.    Appearance: He is well-developed.  HENT:     Head: Normocephalic.     Comments: No hematoma, no skull tenderness, small superficial abrasion at the vertex of his scalp    Right Ear: External ear normal.     Left Ear: External ear normal.  Eyes:     General: No scleral icterus.       Right eye: No discharge.        Left eye: No discharge.     Conjunctiva/sclera: Conjunctivae normal.  Neck:     Musculoskeletal: Neck supple.      Trachea: No tracheal deviation.  Cardiovascular:     Rate and Rhythm: Normal rate and regular rhythm.  Pulmonary:     Effort: Pulmonary effort is normal. No respiratory distress.     Breath sounds: Normal breath sounds. No stridor. No wheezing or rales.  Abdominal:     General: Bowel sounds are normal. There is no distension.     Palpations: Abdomen is soft.  Tenderness: There is no abdominal tenderness. There is no guarding or rebound.  Musculoskeletal:        General: No tenderness.  Skin:    General: Skin is warm and dry.     Findings: No rash.  Neurological:     Mental Status: He is alert.     Cranial Nerves: No cranial nerve deficit (no facial droop, extraocular movements intact, no slurred speech).     Sensory: No sensory deficit.     Motor: No abnormal muscle tone or seizure activity.     Coordination: Coordination normal.      ED Treatments / Results   Procedures Procedures (including critical care time)  Medications Ordered in ED Medications  bacitracin ointment 1 application (has no administration in time range)     Initial Impression / Assessment and Plan / ED Course  I have reviewed the triage vital signs and the nursing notes.  Pertinent labs & imaging results that were available during my care of the patient were reviewed by me and considered in my medical decision making (see chart for details).   The patient is alert and at his baseline.  He denies any particular complaints.  He has no concerning findings on physical exam.  His history suggests a mechanical fall.  No signs of any serious head injury.  He is not on anticoagulants.  I do not think any further evaluation including head CT scanning is necessary at this time.  Final Clinical Impressions(s) / ED Diagnoses   Final diagnoses:  Minor head injury, initial encounter    ED Discharge Orders    None       Linwood Dibbles, MD 07/06/18 2242

## 2018-07-06 NOTE — ED Notes (Signed)
Ambulated PT, he was slow but steady on his feet.

## 2018-07-06 NOTE — ED Triage Notes (Signed)
Pt brought to ED from [facility] following fall after getting out of shower. Pt hit head but did not have LOC. Pt states he slipped getting out of shower. Pt alert and oriented x 3. Pt takes ASA daily.

## 2018-07-06 NOTE — Discharge Instructions (Signed)
Please review the head injury instruction sheet, return to the ED for confusion, severe headache, vomiting or other concerning symptoms

## 2018-07-07 ENCOUNTER — Inpatient Hospital Stay (HOSPITAL_COMMUNITY): Payer: Medicare Other

## 2018-07-07 ENCOUNTER — Emergency Department (HOSPITAL_COMMUNITY): Payer: Medicare Other

## 2018-07-07 ENCOUNTER — Emergency Department (HOSPITAL_COMMUNITY): Payer: Medicare Other | Admitting: Certified Registered"

## 2018-07-07 ENCOUNTER — Encounter (HOSPITAL_COMMUNITY)
Admission: EM | Disposition: A | Discharge status: Rehabilitation Facility | Payer: Self-pay | Source: Home / Self Care | Attending: Neurological Surgery

## 2018-07-07 ENCOUNTER — Inpatient Hospital Stay (HOSPITAL_COMMUNITY)
Admission: EM | Admit: 2018-07-07 | Discharge: 2018-07-19 | DRG: 026 | Disposition: A | Discharge status: Rehabilitation Facility | Payer: Medicare Other | Attending: Neurological Surgery | Admitting: Neurological Surgery

## 2018-07-07 DIAGNOSIS — Z7952 Long term (current) use of systemic steroids: Secondary | ICD-10-CM

## 2018-07-07 DIAGNOSIS — S062X0A Diffuse traumatic brain injury without loss of consciousness, initial encounter: Principal | ICD-10-CM | POA: Diagnosis present

## 2018-07-07 DIAGNOSIS — J449 Chronic obstructive pulmonary disease, unspecified: Secondary | ICD-10-CM | POA: Diagnosis not present

## 2018-07-07 DIAGNOSIS — D62 Acute posthemorrhagic anemia: Secondary | ICD-10-CM | POA: Diagnosis not present

## 2018-07-07 DIAGNOSIS — R4182 Altered mental status, unspecified: Secondary | ICD-10-CM | POA: Diagnosis not present

## 2018-07-07 DIAGNOSIS — Y92092 Bedroom in other non-institutional residence as the place of occurrence of the external cause: Secondary | ICD-10-CM | POA: Diagnosis not present

## 2018-07-07 DIAGNOSIS — I639 Cerebral infarction, unspecified: Secondary | ICD-10-CM | POA: Diagnosis not present

## 2018-07-07 DIAGNOSIS — R350 Frequency of micturition: Secondary | ICD-10-CM | POA: Diagnosis not present

## 2018-07-07 DIAGNOSIS — E7800 Pure hypercholesterolemia, unspecified: Secondary | ICD-10-CM | POA: Diagnosis present

## 2018-07-07 DIAGNOSIS — R404 Transient alteration of awareness: Secondary | ICD-10-CM | POA: Diagnosis not present

## 2018-07-07 DIAGNOSIS — S065X9A Traumatic subdural hemorrhage with loss of consciousness of unspecified duration, initial encounter: Secondary | ICD-10-CM | POA: Diagnosis not present

## 2018-07-07 DIAGNOSIS — D72829 Elevated white blood cell count, unspecified: Secondary | ICD-10-CM | POA: Diagnosis not present

## 2018-07-07 DIAGNOSIS — R0689 Other abnormalities of breathing: Secondary | ICD-10-CM | POA: Diagnosis not present

## 2018-07-07 DIAGNOSIS — R0902 Hypoxemia: Secondary | ICD-10-CM | POA: Diagnosis not present

## 2018-07-07 DIAGNOSIS — Z978 Presence of other specified devices: Secondary | ICD-10-CM | POA: Diagnosis not present

## 2018-07-07 DIAGNOSIS — E785 Hyperlipidemia, unspecified: Secondary | ICD-10-CM | POA: Diagnosis present

## 2018-07-07 DIAGNOSIS — S069X3S Unspecified intracranial injury with loss of consciousness of 1 hour to 5 hours 59 minutes, sequela: Secondary | ICD-10-CM | POA: Diagnosis not present

## 2018-07-07 DIAGNOSIS — Z4659 Encounter for fitting and adjustment of other gastrointestinal appliance and device: Secondary | ICD-10-CM | POA: Diagnosis not present

## 2018-07-07 DIAGNOSIS — G8191 Hemiplegia, unspecified affecting right dominant side: Secondary | ICD-10-CM | POA: Diagnosis not present

## 2018-07-07 DIAGNOSIS — S065X0S Traumatic subdural hemorrhage without loss of consciousness, sequela: Secondary | ICD-10-CM | POA: Diagnosis not present

## 2018-07-07 DIAGNOSIS — W06XXXA Fall from bed, initial encounter: Secondary | ICD-10-CM | POA: Diagnosis present

## 2018-07-07 DIAGNOSIS — Z823 Family history of stroke: Secondary | ICD-10-CM

## 2018-07-07 DIAGNOSIS — R9089 Other abnormal findings on diagnostic imaging of central nervous system: Secondary | ICD-10-CM

## 2018-07-07 DIAGNOSIS — G935 Compression of brain: Secondary | ICD-10-CM | POA: Diagnosis not present

## 2018-07-07 DIAGNOSIS — F71 Moderate intellectual disabilities: Secondary | ICD-10-CM | POA: Diagnosis present

## 2018-07-07 DIAGNOSIS — Z4682 Encounter for fitting and adjustment of non-vascular catheter: Secondary | ICD-10-CM | POA: Diagnosis not present

## 2018-07-07 DIAGNOSIS — S065X3S Traumatic subdural hemorrhage with loss of consciousness of 1 hour to 5 hours 59 minutes, sequela: Secondary | ICD-10-CM | POA: Diagnosis not present

## 2018-07-07 DIAGNOSIS — Z9911 Dependence on respirator [ventilator] status: Secondary | ICD-10-CM | POA: Diagnosis not present

## 2018-07-07 DIAGNOSIS — Z5189 Encounter for other specified aftercare: Secondary | ICD-10-CM | POA: Diagnosis not present

## 2018-07-07 DIAGNOSIS — F209 Schizophrenia, unspecified: Secondary | ICD-10-CM | POA: Diagnosis present

## 2018-07-07 DIAGNOSIS — F419 Anxiety disorder, unspecified: Secondary | ICD-10-CM | POA: Diagnosis present

## 2018-07-07 DIAGNOSIS — F79 Unspecified intellectual disabilities: Secondary | ICD-10-CM

## 2018-07-07 DIAGNOSIS — Z7982 Long term (current) use of aspirin: Secondary | ICD-10-CM | POA: Diagnosis not present

## 2018-07-07 DIAGNOSIS — I69851 Hemiplegia and hemiparesis following other cerebrovascular disease affecting right dominant side: Secondary | ICD-10-CM | POA: Diagnosis not present

## 2018-07-07 DIAGNOSIS — H510 Palsy (spasm) of conjugate gaze: Secondary | ICD-10-CM | POA: Diagnosis present

## 2018-07-07 DIAGNOSIS — I6203 Nontraumatic chronic subdural hemorrhage: Secondary | ICD-10-CM | POA: Diagnosis not present

## 2018-07-07 DIAGNOSIS — Z79899 Other long term (current) drug therapy: Secondary | ICD-10-CM

## 2018-07-07 DIAGNOSIS — R4701 Aphasia: Secondary | ICD-10-CM | POA: Diagnosis not present

## 2018-07-07 DIAGNOSIS — N182 Chronic kidney disease, stage 2 (mild): Secondary | ICD-10-CM | POA: Diagnosis not present

## 2018-07-07 DIAGNOSIS — F17210 Nicotine dependence, cigarettes, uncomplicated: Secondary | ICD-10-CM | POA: Diagnosis present

## 2018-07-07 DIAGNOSIS — I10 Essential (primary) hypertension: Secondary | ICD-10-CM | POA: Diagnosis present

## 2018-07-07 DIAGNOSIS — I129 Hypertensive chronic kidney disease with stage 1 through stage 4 chronic kidney disease, or unspecified chronic kidney disease: Secondary | ICD-10-CM | POA: Diagnosis not present

## 2018-07-07 DIAGNOSIS — R4189 Other symptoms and signs involving cognitive functions and awareness: Secondary | ICD-10-CM

## 2018-07-07 DIAGNOSIS — I6201 Nontraumatic acute subdural hemorrhage: Secondary | ICD-10-CM | POA: Diagnosis not present

## 2018-07-07 DIAGNOSIS — R4020 Unspecified coma: Secondary | ICD-10-CM | POA: Diagnosis not present

## 2018-07-07 DIAGNOSIS — R29810 Facial weakness: Secondary | ICD-10-CM | POA: Diagnosis not present

## 2018-07-07 DIAGNOSIS — R1312 Dysphagia, oropharyngeal phase: Secondary | ICD-10-CM | POA: Diagnosis not present

## 2018-07-07 DIAGNOSIS — R29720 NIHSS score 20: Secondary | ICD-10-CM | POA: Diagnosis not present

## 2018-07-07 DIAGNOSIS — R1310 Dysphagia, unspecified: Secondary | ICD-10-CM | POA: Diagnosis not present

## 2018-07-07 DIAGNOSIS — R739 Hyperglycemia, unspecified: Secondary | ICD-10-CM | POA: Diagnosis not present

## 2018-07-07 DIAGNOSIS — G939 Disorder of brain, unspecified: Secondary | ICD-10-CM

## 2018-07-07 DIAGNOSIS — R464 Slowness and poor responsiveness: Secondary | ICD-10-CM | POA: Diagnosis not present

## 2018-07-07 DIAGNOSIS — I6200 Nontraumatic subdural hemorrhage, unspecified: Secondary | ICD-10-CM | POA: Diagnosis not present

## 2018-07-07 DIAGNOSIS — S065X0A Traumatic subdural hemorrhage without loss of consciousness, initial encounter: Secondary | ICD-10-CM | POA: Diagnosis not present

## 2018-07-07 DIAGNOSIS — S065XAA Traumatic subdural hemorrhage with loss of consciousness status unknown, initial encounter: Secondary | ICD-10-CM | POA: Diagnosis present

## 2018-07-07 DIAGNOSIS — R0989 Other specified symptoms and signs involving the circulatory and respiratory systems: Secondary | ICD-10-CM | POA: Diagnosis not present

## 2018-07-07 HISTORY — PX: CRANIOTOMY: SHX93

## 2018-07-07 LAB — URINALYSIS, ROUTINE W REFLEX MICROSCOPIC
Bilirubin Urine: NEGATIVE
Glucose, UA: NEGATIVE mg/dL
Hgb urine dipstick: NEGATIVE
Ketones, ur: 20 mg/dL — AB
Leukocytes,Ua: NEGATIVE
Nitrite: NEGATIVE
PROTEIN: NEGATIVE mg/dL
Specific Gravity, Urine: 1.01 (ref 1.005–1.030)
pH: 8 (ref 5.0–8.0)

## 2018-07-07 LAB — COMPREHENSIVE METABOLIC PANEL
ALT: 16 U/L (ref 0–44)
ANION GAP: 9 (ref 5–15)
AST: 23 U/L (ref 15–41)
Albumin: 4.3 g/dL (ref 3.5–5.0)
Alkaline Phosphatase: 94 U/L (ref 38–126)
BUN: 19 mg/dL (ref 8–23)
CALCIUM: 9.4 mg/dL (ref 8.9–10.3)
CO2: 24 mmol/L (ref 22–32)
CREATININE: 1.04 mg/dL (ref 0.61–1.24)
Chloride: 103 mmol/L (ref 98–111)
GFR calc Af Amer: >60 mL/min (ref >60)
Glucose, Bld: 143 mg/dL — ABNORMAL HIGH (ref 70–99)
Potassium: 4 mmol/L (ref 3.5–5.1)
Sodium: 136 mmol/L (ref 135–145)
Total Bilirubin: 0.5 mg/dL (ref 0.3–1.2)
Total Protein: 7.9 g/dL (ref 6.5–8.1)

## 2018-07-07 LAB — DIFFERENTIAL
Abs Immature Granulocytes: 0.04 10*3/uL (ref 0.00–0.07)
Basophils Absolute: 0 10*3/uL (ref 0.0–0.1)
Basophils Relative: 0 %
Eosinophils Absolute: 0 10*3/uL (ref 0.0–0.5)
Eosinophils Relative: 0 %
Immature Granulocytes: 0 %
Lymphocytes Relative: 6 %
Lymphs Abs: 0.7 10*3/uL (ref 0.7–4.0)
MONOS PCT: 5 %
Monocytes Absolute: 0.6 10*3/uL (ref 0.1–1.0)
Neutro Abs: 10.2 10*3/uL — ABNORMAL HIGH (ref 1.7–7.7)
Neutrophils Relative %: 89 %

## 2018-07-07 LAB — POCT I-STAT 7, (LYTES, BLD GAS, ICA,H+H)
Acid-base deficit: 2 mmol/L (ref 0.0–2.0)
Bicarbonate: 24.1 mmol/L (ref 20.0–28.0)
Calcium, Ion: 1.17 mmol/L (ref 1.15–1.40)
HCT: 32 % — ABNORMAL LOW (ref 39.0–52.0)
Hemoglobin: 10.9 g/dL — ABNORMAL LOW (ref 13.0–17.0)
O2 Saturation: 100 %
Patient temperature: 35.6
Potassium: 3.6 mmol/L (ref 3.5–5.1)
Sodium: 138 mmol/L (ref 135–145)
TCO2: 25 mmol/L (ref 22–32)
pCO2 arterial: 44.2 mmHg (ref 32.0–48.0)
pH, Arterial: 7.337 — ABNORMAL LOW (ref 7.350–7.450)
pO2, Arterial: 436 mmHg — ABNORMAL HIGH (ref 83.0–108.0)

## 2018-07-07 LAB — CBC
HCT: 43 % (ref 39.0–52.0)
Hemoglobin: 14.1 g/dL (ref 13.0–17.0)
MCH: 29.6 pg (ref 26.0–34.0)
MCHC: 32.8 g/dL (ref 30.0–36.0)
MCV: 90.1 fL (ref 80.0–100.0)
Platelets: 237 10*3/uL (ref 150–400)
RBC: 4.77 MIL/uL (ref 4.22–5.81)
RDW: 13.6 % (ref 11.5–15.5)
WBC: 11.5 10*3/uL — ABNORMAL HIGH (ref 4.0–10.5)
nRBC: 0 % (ref 0.0–0.2)

## 2018-07-07 LAB — RAPID URINE DRUG SCREEN, HOSP PERFORMED
Amphetamines: NOT DETECTED
Barbiturates: NOT DETECTED
Benzodiazepines: NOT DETECTED
Cocaine: NOT DETECTED
Opiates: NOT DETECTED
Tetrahydrocannabinol: NOT DETECTED

## 2018-07-07 LAB — ETHANOL: Alcohol, Ethyl (B): <10 mg/dL (ref <10)

## 2018-07-07 LAB — PROTIME-INR
INR: 1 (ref 0.8–1.2)
Prothrombin Time: 13.2 seconds (ref 11.4–15.2)

## 2018-07-07 LAB — TRIGLYCERIDES: TRIGLYCERIDES: 30 mg/dL (ref <150)

## 2018-07-07 LAB — APTT: APTT: 29 s (ref 24–36)

## 2018-07-07 LAB — MRSA PCR SCREENING: MRSA by PCR: NEGATIVE

## 2018-07-07 LAB — I-STAT CREATININE, ED: Creatinine, Ser: 1.1 mg/dL (ref 0.61–1.24)

## 2018-07-07 SURGERY — CRANIOTOMY HEMATOMA EVACUATION SUBDURAL
Anesthesia: General | Site: Head | Laterality: Left

## 2018-07-07 MED ORDER — CEFAZOLIN SODIUM-DEXTROSE 1-4 GM/50ML-% IV SOLN
1.0000 g | Freq: Three times a day (TID) | INTRAVENOUS | Status: AC
  Administered 2018-07-07 (×2): 1 g via INTRAVENOUS
  Filled 2018-07-07 (×2): qty 50

## 2018-07-07 MED ORDER — THROMBIN 5000 UNITS EX SOLR
CUTANEOUS | Status: AC
  Filled 2018-07-07: qty 5000

## 2018-07-07 MED ORDER — ACETAMINOPHEN 650 MG RE SUPP
650.0000 mg | RECTAL | Status: DC | PRN
  Administered 2018-07-09: 650 mg via RECTAL
  Filled 2018-07-07: qty 1

## 2018-07-07 MED ORDER — FLUTICASONE FUROATE-VILANTEROL 100-25 MCG/INH IN AEPB: 1.0000 | INHALATION_SPRAY | Freq: Every day | RESPIRATORY_TRACT | Status: DC

## 2018-07-07 MED ORDER — PROPOFOL 500 MG/50ML IV EMUL
INTRAVENOUS | Status: DC | PRN
  Administered 2018-07-07: 60 ug/kg/min via INTRAVENOUS

## 2018-07-07 MED ORDER — PROPOFOL 1000 MG/100ML IV EMUL
5.0000 ug/kg/min | INTRAVENOUS | Status: DC
  Administered 2018-07-07: 20 ug/kg/min via INTRAVENOUS

## 2018-07-07 MED ORDER — ROCURONIUM 10MG/ML (10ML) SYRINGE FOR MEDFUSION PUMP - OPTIME
INTRAVENOUS | Status: DC | PRN
  Administered 2018-07-07 (×2): 50 mg via INTRAVENOUS

## 2018-07-07 MED ORDER — ORAL CARE MOUTH RINSE
15.0000 mL | Freq: Two times a day (BID) | OROMUCOSAL | Status: DC
  Administered 2018-07-07 – 2018-07-12 (×11): 15 mL via OROMUCOSAL

## 2018-07-07 MED ORDER — ESMOLOL HCL 100 MG/10ML IV SOLN
INTRAVENOUS | Status: DC | PRN
  Administered 2018-07-07: 20 mg via INTRAVENOUS

## 2018-07-07 MED ORDER — ETOMIDATE 2 MG/ML IV SOLN
30.0000 mg | Freq: Once | INTRAVENOUS | Status: AC
  Administered 2018-07-07: 30 mg via INTRAVENOUS

## 2018-07-07 MED ORDER — NICARDIPINE HCL IN NACL 20-0.86 MG/200ML-% IV SOLN
INTRAVENOUS | Status: AC
  Filled 2018-07-07: qty 200

## 2018-07-07 MED ORDER — LIDOCAINE-EPINEPHRINE 1 %-1:100000 IJ SOLN
INTRAMUSCULAR | Status: DC | PRN
  Administered 2018-07-07: 10 mL via INTRADERMAL

## 2018-07-07 MED ORDER — BACITRACIN ZINC 500 UNIT/GM EX OINT
TOPICAL_OINTMENT | CUTANEOUS | Status: AC
  Filled 2018-07-07: qty 28.35

## 2018-07-07 MED ORDER — ACETAMINOPHEN 325 MG PO TABS: 650.0000 mg | ORAL_TABLET | ORAL | Status: DC | PRN

## 2018-07-07 MED ORDER — SODIUM CHLORIDE 0.9 % IV SOLN
INTRAVENOUS | Status: DC | PRN
  Administered 2018-07-07 (×2): via INTRAVENOUS

## 2018-07-07 MED ORDER — FENTANYL CITRATE (PF) 100 MCG/2ML IJ SOLN: 25.0000 ug | INTRAMUSCULAR | Status: DC | PRN

## 2018-07-07 MED ORDER — THROMBIN 5000 UNITS EX SOLR
OROMUCOSAL | Status: DC | PRN
  Administered 2018-07-07 (×2): 5 mL via TOPICAL

## 2018-07-07 MED ORDER — SENNA 8.6 MG PO TABS
1.0000 | ORAL_TABLET | Freq: Two times a day (BID) | ORAL | Status: DC
  Filled 2018-07-07: qty 1

## 2018-07-07 MED ORDER — LABETALOL HCL 5 MG/ML IV SOLN: 10.0000 mg | INTRAVENOUS | Status: DC | PRN

## 2018-07-07 MED ORDER — THROMBIN 20000 UNITS EX SOLR
CUTANEOUS | Status: DC | PRN
  Administered 2018-07-07: 20 mL via TOPICAL

## 2018-07-07 MED ORDER — CHLORHEXIDINE GLUCONATE 0.12% ORAL RINSE (MEDLINE KIT)
15.0000 mL | Freq: Two times a day (BID) | OROMUCOSAL | Status: DC
  Administered 2018-07-07: 15 mL via OROMUCOSAL

## 2018-07-07 MED ORDER — PROPOFOL 1000 MG/100ML IV EMUL
INTRAVENOUS | Status: AC
  Filled 2018-07-07: qty 100

## 2018-07-07 MED ORDER — POTASSIUM CHLORIDE IN NACL 20-0.9 MEQ/L-% IV SOLN
INTRAVENOUS | Status: DC
  Administered 2018-07-08 – 2018-07-13 (×10): via INTRAVENOUS
  Filled 2018-07-07 (×11): qty 1000

## 2018-07-07 MED ORDER — MORPHINE SULFATE (PF) 2 MG/ML IV SOLN: 1.0000 mg | INTRAVENOUS | Status: DC | PRN

## 2018-07-07 MED ORDER — PROMETHAZINE HCL 12.5 MG PO TABS
12.5000 mg | ORAL_TABLET | ORAL | Status: DC | PRN
  Filled 2018-07-07: qty 2

## 2018-07-07 MED ORDER — FAMOTIDINE 20 MG IN NS 100 ML IVPB: 20.0000 mg | Freq: Two times a day (BID) | INTRAVENOUS | Status: DC

## 2018-07-07 MED ORDER — FENTANYL CITRATE (PF) 250 MCG/5ML IJ SOLN
INTRAMUSCULAR | Status: DC | PRN
  Administered 2018-07-07 (×2): 100 ug via INTRAVENOUS
  Administered 2018-07-07: 50 ug via INTRAVENOUS

## 2018-07-07 MED ORDER — PAROXETINE HCL 20 MG PO TABS: 20.0000 mg | ORAL_TABLET | Freq: Every day | ORAL | Status: DC

## 2018-07-07 MED ORDER — CHLORHEXIDINE GLUCONATE 0.12 % MT SOLN
15.0000 mL | Freq: Two times a day (BID) | OROMUCOSAL | Status: DC
  Administered 2018-07-07 – 2018-07-13 (×12): 15 mL via OROMUCOSAL
  Filled 2018-07-07 (×6): qty 15

## 2018-07-07 MED ORDER — IPRATROPIUM-ALBUTEROL 0.5-2.5 (3) MG/3ML IN SOLN
3.0000 mL | Freq: Three times a day (TID) | RESPIRATORY_TRACT | Status: DC
  Administered 2018-07-08: 3 mL via RESPIRATORY_TRACT
  Filled 2018-07-07: qty 3

## 2018-07-07 MED ORDER — RISPERIDONE 1 MG PO TABS: 1.0000 mg | ORAL_TABLET | Freq: Two times a day (BID) | ORAL | Status: DC

## 2018-07-07 MED ORDER — BUDESONIDE 0.25 MG/2ML IN SUSP
0.2500 mg | Freq: Two times a day (BID) | RESPIRATORY_TRACT | Status: DC
  Administered 2018-07-07 – 2018-07-08 (×3): 0.25 mg via RESPIRATORY_TRACT
  Filled 2018-07-07 (×3): qty 2

## 2018-07-07 MED ORDER — AMLODIPINE BESYLATE 10 MG PO TABS: 10.0000 mg | ORAL_TABLET | Freq: Every day | ORAL | Status: DC

## 2018-07-07 MED ORDER — LEVETIRACETAM IN NACL 500 MG/100ML IV SOLN
500.0000 mg | Freq: Two times a day (BID) | INTRAVENOUS | Status: DC
  Administered 2018-07-07 – 2018-07-13 (×13): 500 mg via INTRAVENOUS
  Filled 2018-07-07 (×13): qty 100

## 2018-07-07 MED ORDER — NICARDIPINE HCL IN NACL 20-0.86 MG/200ML-% IV SOLN
3.0000 mg/h | INTRAVENOUS | Status: DC
  Administered 2018-07-07: 12.5 mg/h via INTRAVENOUS
  Administered 2018-07-07: 5 mg/h via INTRAVENOUS
  Administered 2018-07-07: 12.5 mg/h via INTRAVENOUS
  Administered 2018-07-07: 3 mg/h via INTRAVENOUS
  Administered 2018-07-07: 12.5 mg/h via INTRAVENOUS
  Administered 2018-07-07: 2 mg/h via INTRAVENOUS
  Administered 2018-07-07 – 2018-07-08 (×2): 12.5 mg/h via INTRAVENOUS
  Administered 2018-07-08: 7.5 mg/h via INTRAVENOUS
  Administered 2018-07-08: 5 mg/h via INTRAVENOUS
  Filled 2018-07-07 (×10): qty 200

## 2018-07-07 MED ORDER — LIDOCAINE-EPINEPHRINE 1 %-1:100000 IJ SOLN
INTRAMUSCULAR | Status: AC
  Filled 2018-07-07: qty 1

## 2018-07-07 MED ORDER — PANTOPRAZOLE SODIUM 40 MG IV SOLR
40.0000 mg | Freq: Every day | INTRAVENOUS | Status: DC
  Administered 2018-07-07 – 2018-07-12 (×6): 40 mg via INTRAVENOUS
  Filled 2018-07-07 (×6): qty 40

## 2018-07-07 MED ORDER — PROPOFOL 1000 MG/100ML IV EMUL: 5.0000 ug/kg/min | INTRAVENOUS | Status: DC

## 2018-07-07 MED ORDER — IPRATROPIUM-ALBUTEROL 0.5-2.5 (3) MG/3ML IN SOLN
3.0000 mL | Freq: Four times a day (QID) | RESPIRATORY_TRACT | Status: DC
  Administered 2018-07-07 (×3): 3 mL via RESPIRATORY_TRACT
  Filled 2018-07-07 (×4): qty 3

## 2018-07-07 MED ORDER — THROMBIN 20000 UNITS EX SOLR
CUTANEOUS | Status: AC
  Filled 2018-07-07: qty 20000

## 2018-07-07 MED ORDER — ORAL CARE MOUTH RINSE
15.0000 mL | OROMUCOSAL | Status: DC
  Administered 2018-07-07 (×3): 15 mL via OROMUCOSAL

## 2018-07-07 MED ORDER — ALBUTEROL SULFATE (2.5 MG/3ML) 0.083% IN NEBU: 2.5000 mg | INHALATION_SOLUTION | Freq: Four times a day (QID) | RESPIRATORY_TRACT | Status: DC | PRN

## 2018-07-07 MED ORDER — SENNA 8.6 MG PO TABS
1.0000 | ORAL_TABLET | Freq: Two times a day (BID) | ORAL | Status: DC
  Administered 2018-07-09 – 2018-07-12 (×4): 8.6 mg
  Filled 2018-07-07 (×4): qty 1

## 2018-07-07 MED ORDER — 0.9 % SODIUM CHLORIDE (POUR BTL) OPTIME
TOPICAL | Status: DC | PRN
  Administered 2018-07-07 (×2): 1000 mL

## 2018-07-07 MED ORDER — ROCURONIUM BROMIDE 50 MG/5ML IV SOLN
100.0000 mg | Freq: Once | INTRAVENOUS | Status: AC
  Administered 2018-07-07: 100 mg via INTRAVENOUS

## 2018-07-07 MED ORDER — BACITRACIN ZINC 500 UNIT/GM EX OINT
TOPICAL_OINTMENT | CUTANEOUS | Status: DC | PRN
  Administered 2018-07-07: 1 via TOPICAL

## 2018-07-07 MED ORDER — ONDANSETRON HCL 4 MG PO TABS: 4.0000 mg | ORAL_TABLET | ORAL | Status: DC | PRN

## 2018-07-07 MED ORDER — EPHEDRINE SULFATE 50 MG/ML IJ SOLN
INTRAMUSCULAR | Status: DC | PRN
  Administered 2018-07-07 (×4): 10 mg via INTRAVENOUS

## 2018-07-07 MED ORDER — NICARDIPINE HCL IN NACL 20-0.86 MG/200ML-% IV SOLN
0.0000 mg/h | INTRAVENOUS | Status: DC
  Administered 2018-07-07: 5 mg/h via INTRAVENOUS

## 2018-07-07 MED ORDER — ONDANSETRON HCL 4 MG/2ML IJ SOLN: 4.0000 mg | INTRAMUSCULAR | Status: DC | PRN

## 2018-07-07 MED ORDER — CEFAZOLIN SODIUM-DEXTROSE 2-3 GM-%(50ML) IV SOLR
INTRAVENOUS | Status: DC | PRN
  Administered 2018-07-07: 2 g via INTRAVENOUS

## 2018-07-07 SURGICAL SUPPLY — 64 items
BAG DECANTER FOR FLEXI CONT (MISCELLANEOUS) ×3 IMPLANT
BUR ACORN 6.0 PRECISION (BURR) ×1 IMPLANT
BUR ACORN 6.0MM PRECISION (BURR) ×1
BUR SPIRAL ROUTER 2.3 (BUR) ×2 IMPLANT
BUR SPIRAL ROUTER 2.3MM (BUR) ×1
CANISTER SUCT 3000ML PPV (MISCELLANEOUS) ×3 IMPLANT
CARTRIDGE OIL MAESTRO DRILL (MISCELLANEOUS) ×1 IMPLANT
CLIP VESOCCLUDE MED 6/CT (CLIP) IMPLANT
COVER WAND RF STERILE (DRAPES) ×3 IMPLANT
DIFFUSER DRILL AIR PNEUMATIC (MISCELLANEOUS) ×3 IMPLANT
DRAPE MICROSCOPE LEICA (MISCELLANEOUS) IMPLANT
DRAPE NEUROLOGICAL W/INCISE (DRAPES) ×3 IMPLANT
DRAPE SURG 17X23 STRL (DRAPES) IMPLANT
DRAPE WARM FLUID 44X44 (DRAPE) ×3 IMPLANT
DURAPREP 6ML APPLICATOR 50/CS (WOUND CARE) ×3 IMPLANT
ELECT CAUTERY BLADE 6.4 (BLADE) ×3 IMPLANT
ELECT REM PT RETURN 9FT ADLT (ELECTROSURGICAL) ×3
ELECTRODE REM PT RTRN 9FT ADLT (ELECTROSURGICAL) ×1 IMPLANT
EVACUATOR 1/8 PVC DRAIN (DRAIN) IMPLANT
GAUZE 4X4 16PLY RFD (DISPOSABLE) IMPLANT
GAUZE SPONGE 4X4 12PLY STRL (GAUZE/BANDAGES/DRESSINGS) ×3 IMPLANT
GLOVE BIO SURGEON STRL SZ 6.5 (GLOVE) ×1 IMPLANT
GLOVE BIO SURGEON STRL SZ7 (GLOVE) ×4 IMPLANT
GLOVE BIO SURGEON STRL SZ8 (GLOVE) ×3 IMPLANT
GLOVE BIO SURGEONS STRL SZ 6.5 (GLOVE) ×1
GLOVE BIOGEL PI IND STRL 7.0 (GLOVE) IMPLANT
GLOVE BIOGEL PI INDICATOR 7.0 (GLOVE) ×2
GOWN STRL REUS W/ TWL LRG LVL3 (GOWN DISPOSABLE) IMPLANT
GOWN STRL REUS W/ TWL XL LVL3 (GOWN DISPOSABLE) IMPLANT
GOWN STRL REUS W/TWL 2XL LVL3 (GOWN DISPOSABLE) ×3 IMPLANT
GOWN STRL REUS W/TWL LRG LVL3 (GOWN DISPOSABLE) ×6
GOWN STRL REUS W/TWL XL LVL3 (GOWN DISPOSABLE)
HEMOSTAT POWDER KIT SURGIFOAM (HEMOSTASIS) ×2 IMPLANT
KIT BASIN OR (CUSTOM PROCEDURE TRAY) ×3 IMPLANT
KIT TURNOVER KIT B (KITS) ×3 IMPLANT
NEEDLE HYPO 22GX1.5 SAFETY (NEEDLE) ×3 IMPLANT
NS IRRIG 1000ML POUR BTL (IV SOLUTION) ×5 IMPLANT
OIL CARTRIDGE MAESTRO DRILL (MISCELLANEOUS) ×3
PACK CRANIOTOMY CUSTOM (CUSTOM PROCEDURE TRAY) ×3 IMPLANT
PAD ARMBOARD 7.5X6 YLW CONV (MISCELLANEOUS) ×5 IMPLANT
PATTIES SURGICAL .25X.25 (GAUZE/BANDAGES/DRESSINGS) IMPLANT
PATTIES SURGICAL .5 X.5 (GAUZE/BANDAGES/DRESSINGS) IMPLANT
PATTIES SURGICAL .5 X3 (DISPOSABLE) IMPLANT
PATTIES SURGICAL 1X1 (DISPOSABLE) IMPLANT
PERFORATOR LRG  14-11MM (BIT)
PERFORATOR LRG 14-11MM (BIT) ×1 IMPLANT
PIN MAYFIELD SKULL DISP (PIN) ×2 IMPLANT
PLATE 1.5  2HOLE LNG NEURO (Plate) ×8 IMPLANT
PLATE 1.5 2HOLE LNG NEURO (Plate) IMPLANT
RUBBERBAND STERILE (MISCELLANEOUS) IMPLANT
SCREW SELF DRILL HT 1.5/4MM (Screw) ×16 IMPLANT
SPONGE NEURO XRAY DETECT 1X3 (DISPOSABLE) IMPLANT
SPONGE SURGIFOAM ABS GEL 100 (HEMOSTASIS) ×3 IMPLANT
STAPLER VISISTAT 35W (STAPLE) ×5 IMPLANT
SUT ETHILON 3 0 FSL (SUTURE) IMPLANT
SUT NURALON 4 0 TR CR/8 (SUTURE) ×6 IMPLANT
SUT VIC AB 2-0 CP2 18 (SUTURE) ×7 IMPLANT
SYR CONTROL 10ML LL (SYRINGE) ×3 IMPLANT
TAPE CLOTH SURG 4X10 WHT LF (GAUZE/BANDAGES/DRESSINGS) ×2 IMPLANT
TOWEL GREEN STERILE (TOWEL DISPOSABLE) ×3 IMPLANT
TOWEL GREEN STERILE FF (TOWEL DISPOSABLE) ×3 IMPLANT
TRAY FOLEY MTR SLVR 16FR STAT (SET/KITS/TRAYS/PACK) IMPLANT
UNDERPAD 30X30 (UNDERPADS AND DIAPERS) ×2 IMPLANT
WATER STERILE IRR 1000ML POUR (IV SOLUTION) ×3 IMPLANT

## 2018-07-07 NOTE — H&P (Signed)
A note for history and physical was already done by Mrs. Meyran prior to surgery.  Patient fell earlier tonight and presented to the emergency department where CT scan showed a large acute on chronic subdural hematoma.  He was intubated and transferred.  No family was available and patient was unable to consent for surgery.  We assumed emergency consent to perform a left craniotomy for subdural hematoma.

## 2018-07-07 NOTE — ED Triage Notes (Signed)
Per EMS pt last seen normal at 2200 lat night. Pt was evaluated here for fall last night. Pt fell from bed again around 0115 this AM. Pt draws away from pain. No spontaneous eye opening.

## 2018-07-07 NOTE — Progress Notes (Signed)
Code stroke  Call time 152 am Beeper  154 Start  154 Finish  2am Soc  [address]  Rad called [phone]

## 2018-07-07 NOTE — Procedures (Signed)
Extubation Procedure Note  Patient Details:   Name: Michael Mercado DOB: Nov 15, 1940 MRN: [id number]   Airway Documentation:    Vent end date: 07/07/18 Vent end time: 1153   Evaluation  O2 sats: stable throughout Complications: No apparent complications Patient did tolerate procedure well. Bilateral Breath Sounds: Clear, Diminished   No   Pt extubated to 3L [place] per MD order. Pt stable throughout with no complications. Positive cuff leak noted prior to extubation. Pt unable to speak post extubation, but known his baseline abilities. VS within normal limits. No distress noted, no stridor, Diminished BS bilaterally  Carolan Shiver 07/07/2018, 11:55 AM

## 2018-07-07 NOTE — Anesthesia Procedure Notes (Signed)
Arterial Line Insertion Start/End3/19/2020 4:15 AM, 07/07/2018 4:17 AM Performed by: Arta Bruce, MD, Melina Schools, CRNA, CRNA  Patient location: OR. Preanesthetic checklist: patient identified, IV checked, monitors and equipment checked and pre-op evaluation Emergency situation Patient sedated Right, radial was placed Catheter size: 20 G Hand hygiene performed  and maximum sterile barriers used   Attempts: 1 Procedure performed without using ultrasound guided technique. Following insertion, dressing applied and Biopatch. Post procedure assessment: normal  Patient tolerated the procedure well with no immediate complications.

## 2018-07-07 NOTE — ED Notes (Signed)
[facility] paged @ 0157

## 2018-07-07 NOTE — ED Notes (Signed)
Patient arrived with [facility] intubated at [facility] , currently receiving Cardene 12.5 mg/hr and Propofol IV drip at 60 mcg/kg/min , IV sites intact , Foley catheter and OGT intact . Patient transported to OR after evaluated by neurosurgeon .

## 2018-07-07 NOTE — Anesthesia Postprocedure Evaluation (Signed)
Anesthesia Post Note  Patient: Michael Mercado  Procedure(s) Performed: CRANIOTOMY HEMATOMA EVACUATION SUBDURAL (Left Head)     Patient location during evaluation: SICU Anesthesia Type: General Level of consciousness: sedated Pain management: pain level controlled Vital Signs Assessment: post-procedure vital signs reviewed and stable Respiratory status: patient remains intubated per anesthesia plan Cardiovascular status: stable Postop Assessment: no apparent nausea or vomiting Anesthetic complications: no    Last Vitals:  Vitals:   07/07/18 0650 07/07/18 0700  BP:  105/61  Pulse: 66 66  Resp: 14 14  Temp:    SpO2: 100% 100%    Last Pain:  Vitals:   07/07/18 0615  TempSrc: Axillary                 Geraldyne Barraclough DAVID

## 2018-07-07 NOTE — ED Provider Notes (Addendum)
Emergency Department Provider Note   I have reviewed the triage vital signs and the nursing notes.   HISTORY  Chief Complaint Code Stroke   HPI Michael Mercado is a 78 y.o. male who is unable to give history secondary to unresponsiveness however history obtained from group home member, medical records and EMS.  Sounds like the patient had a fall yesterday and presented to emergency department for evaluation.  He was asymptomatic without loss of consciousness and thus was sent home.  EMS crew that brought him in this time also took him one last time and states that he has a new facial droop since that time and that was 20/200.  Tonight he was found on the floor unresponsive.  Hypertensive with them.  Level V Caveat 2/2 unresponsive   Past Medical History:  Diagnosis Date   Hypercholesteremia    Hypertension    Moderate intellectual disability    Mood disorder (HCC)    MR (mental retardation)    Obesity     Patient Active Problem List   Diagnosis Date Noted   CKD (chronic kidney disease), stage II 03/18/2016   HTN (hypertension), benign 03/18/2015   COPD (chronic obstructive pulmonary disease) (HCC) 03/18/2015   Mental retardation 03/18/2015   Schizophrenia (HCC) 03/18/2015   Hyperlipidemia LDL goal <130 03/18/2015    No past surgical history on file.  Current Outpatient Rx   Order #: [id number] Class: Normal   Order #: [id number] Class: Normal   Order #: [id number] Class: Normal   Order #: [id number] Class: Historical Med   Order #: [id number] Class: Normal   Order #: [id number] Class: Historical Med   Order #: [id number] Class: Normal   Order #: [id number] Class: Normal   Order #: [id number] Class: Historical Med   Order #: [id number] Class: Historical Med   Order #: [id number] Class: Normal   Order #: [id number] Class: Normal   Order #: [id number] Class: Normal   Order #: [id number] Class: Normal   Order #: [id number] Class: Historical Med   Order #:  [id number] Class: Historical Med   Order #: [id number] Class: Normal   Order #: [id number] Class: Normal   Order #: [id number] Class: Historical Med   Order #: [id number] Class: Historical Med   Order #: [id number] Class: Historical Med   Order #: [id number] Class: Normal   Order #: [id number] Class: Normal    Allergies Patient has no known allergies.  Family History  Problem Relation Age of Onset   Stroke Sister     Social History Social History   Tobacco Use   Smoking status: Current Every Day Smoker    Packs/day: 0.33    Types: Cigarettes   Smokeless tobacco: Never Used  Substance Use Topics   Alcohol use: No    Alcohol/week: 0.0 standard drinks   Drug use: No    Review of Systems  Level V Caveat 2/2 unresponsive ____________________________________________   PHYSICAL EXAM:  VITAL SIGNS: ED Triage Vitals  Enc Vitals Group   Vitals:   07/07/18 0235 07/07/18 0240 07/07/18 0245 07/07/18 0249  BP: (!) 172/88 (!) 172/86 (!) 171/91   Pulse: 85 90  93  Resp: (!) 21  Temp:    98.1 F (36.7 C)  TempSrc:      SpO2: 100% 100%  100%  Weight:         Constitutional: lethargic. Eyes: Conjunctivae are normal. PERRL. Right eye is deviated to right without normal movement.  Head: small abrasion to scalp with bandaid in place.. Nose: No congestion/rhinnorhea. Mouth/Throat: Mucous membranes are moist.  Oropharynx non-erythematous. Neck: No stridor.  No meningeal signs.   Cardiovascular: Normal rate, regular rhythm. Good peripheral circulation. Grossly normal heart sounds.   Respiratory: tachypneic respiratory effort.  No retractions. Lungs CTAB. Gastrointestinal: Soft and nontender. No distention.  Musculoskeletal: No lower extremity tenderness nor edema. No gross deformities of extremities. Neurologic: does not follow commands, right eye laterally deviated, right facial droop. doesn't open eyes at all, moves all extremities to pain without obvious asymmetry but  difficult to assess. Skin:  Skin is warm, dry and intact. No rash noted.   ____________________________________________   LABS (all labs ordered are listed, but only abnormal results are displayed)  Labs Reviewed  CBC - Abnormal; Notable for the following components:      Result Value   WBC 11.5 (*)    All other components within normal limits  DIFFERENTIAL - Abnormal; Notable for the following components:   Neutro Abs 10.2 (*)    All other components within normal limits  COMPREHENSIVE METABOLIC PANEL - Abnormal; Notable for the following components:   Glucose, Bld 143 (*)    All other components within normal limits  ETHANOL  PROTIME-INR  APTT  RAPID URINE DRUG SCREEN, HOSP PERFORMED  URINALYSIS, ROUTINE W REFLEX MICROSCOPIC  I-STAT CREATININE, ED   ____________________________________________  RADIOLOGY  Dg Chest Portable 1 View  Result Date: 07/07/2018 CLINICAL DATA:  Post intubation and OG tube placement. Hx of HTN and current smoker. EXAM: PORTABLE CHEST 1 VIEW COMPARISON:  None. FINDINGS: Endotracheal tube tip projects 4.9 cm above the carina. Nasal/orogastric tube passes below the diaphragm and below the included field of view. Cardiac silhouette is normal in size. No mediastinal or hilar masses. There are prominent bronchovascular markings most evident at the bases with additional lung base opacity at likely due to atelectasis. No convincing pneumonia. No evidence of pulmonary edema. No pleural effusion or pneumothorax. Skeletal structures are grossly intact. IMPRESSION: 1. Endotracheal tube tip projects 4.9 cm above the carina. Nasal/orogastric tube passes below the diaphragm into the stomach. 2. No acute cardiopulmonary disease. Electronically Signed   By: Amie Portland M.D.   On: 07/07/2018 02:45   Ct Head Code Stroke Wo Contrast  Result Date: 07/07/2018 CLINICAL DATA:  Code stroke. Initial evaluation for acute sudden onset weakness, prior fall. EXAM: CT HEAD WITHOUT  CONTRAST TECHNIQUE: Contiguous axial images were obtained from the base of the skull through the vertex without intravenous contrast. COMPARISON:  None. FINDINGS: Brain: Large mixed attenuation left subdural hemorrhage overlies the left cerebral convexity, measuring up to 2.8 cm in maximal diameter at the left frontal convexity. Scattered acute blood products present within this collection. Associated mass effect on the subjacent left cerebral hemisphere with up to 19 mm of left-to-right shift. Left lateral ventricle partially effaced. Asymmetric dilatation of the right lateral ventricle concerning for ventricular trapping. Basilar cisterns partially effaced but remain patent at this time. No other acute intracranial hemorrhage. No large vessel territory infarct. No appreciable mass lesion. Vascular: No hyperdense vessel. Skull: Oval small scalp contusion at the left occipital scalp. No calvarial fracture. Sinuses/Orbits: Globes and orbital soft tissues demonstrate no acute finding. Paranasal sinuses and mastoid air cells are clear. Other: None. IMPRESSION: 1. Large mixed attenuation left holo hemispheric subdural hematoma measuring up to 2.8 cm in maximal diameter. Associated extensive mass effect on the subjacent left cerebral hemisphere with up to 19 mm of left-to-right shift. Emergent neuro surgical consultation recommended. 2. Asymmetric dilatation of the right lateral ventricle, concerning for early ventricular trapping. 3. Small left occipital scalp  contusion.  No calvarial fracture. Critical Value/emergent results were called by telephone at the time of interpretation on 07/07/2018 at 2:07 am to Dr. Marily Memos , who verbally acknowledged these results. Electronically Signed   By: Rise Mu M.D.   On: 07/07/2018 02:14    ____________________________________________   PROCEDURES  Procedure(s) performed:   .Critical Care Performed by: Marily Memos, MD Authorized by: Marily Memos, MD    Critical care provider statement:    Critical care time (minutes):  45   Critical care was necessary to treat or prevent imminent or life-threatening deterioration of the following conditions:  CNS failure or compromise and respiratory failure   Critical care was time spent personally by me on the following activities:  Discussions with consultants, evaluation of patient's response to treatment, examination of patient, ordering and performing treatments and interventions, ordering and review of laboratory studies, ordering and review of radiographic studies, pulse oximetry, re-evaluation of patient's condition, obtaining history from patient or surrogate and review of old charts   I assumed direction of critical care for this patient from another provider in my specialty: no   Procedure Name: Intubation Date/Time: 07/07/2018 3:05 AM Performed by: Marily Memos, MD Pre-anesthesia Checklist: Patient identified, Patient being monitored, Emergency Drugs available, Timeout performed and Suction available Oxygen Delivery Method: Non-rebreather mask Preoxygenation: Pre-oxygenation with 100% oxygen Induction Type: Rapid sequence Ventilation: Mask ventilation without difficulty Laryngoscope Size: Glidescope and 4 Grade View: Grade I Tube type: Subglottic suction tube Tube size: 8.0 mm Number of attempts: 1 Airway Equipment and Method: Rigid stylet Placement Confirmation: ETT inserted through vocal cords under direct vision,  CO2 detector and Breath sounds checked- equal and bilateral Secured at: 24 cm Tube secured with: ETT holder Dental Injury: Teeth and Oropharynx as per pre-operative assessment  Future Recommendations: Recommend- induction with short-acting agent, and alternative techniques readily available     ____________________________________________   INITIAL IMPRESSION / ASSESSMENT AND PLAN / ED COURSE  Code stroke activated verbally at 0155 2/2 AMS, new R facial droop since  2200 and right eye laterally deviated. Pupils normal. Reacts to pain. Directly to CT on EMS stretcher.  CT reviewed by myself, large subdural, radiology called, confirmed, stroke called off, NSG paged at 0210.  NS[facility] APP, Selena Batten, responded, accept ER to ER in lieu of transferring someone out of neuro ICU. Dr. Chad Cordial (sp.?) attending NSG.  [facility] arrived for another patient, will engage them for ER to ER transfer. Patient intubated. Propofol/cardene initiated.   Discussed with Dr. Elesa Massed at [facility] ER who accepts and will engage NSG upon patient's arrival if they are not already there.   Labs unremarkable. BP not improving much with cardene. Getting ready to transfer for emergent NSG evaluation.   Pertinent labs & imaging results that were available during my care of the patient were reviewed by me and considered in my medical decision making (see chart for details).  ____________________________________________  FINAL CLINICAL IMPRESSION(S) / ED DIAGNOSES  Final diagnoses:  Subdural hematoma (HCC)  Unresponsive  Midline shift of brain     MEDICATIONS GIVEN DURING THIS VISIT:  Medications  propofol (DIPRIVAN) 1000 MG/100ML infusion (20 mcg/kg/min  80 kg Intravenous New Bag/Given 07/07/18 0231)  nicardipine (CARDENE)  in 0.86% saline IV infusion (0.1 mg/ml) (5 mg/hr Intravenous New Bag/Given 07/07/18 0237)  etomidate (AMIDATE) injection 30 mg (30 mg Intravenous Given 07/07/18 0222)  rocuronium (ZEMURON) injection 100 mg (100 mg Intravenous Given 07/07/18 0223)     NEW OUTPATIENT MEDICATIONS STARTED DURING  THIS VISIT:  New Prescriptions   No medications on file    Note:  This note was prepared with assistance of Dragon voice recognition software. Occasional wrong-word or sound-a-like substitutions may have occurred due to the inherent limitations of voice recognition software.   Fishel Wamble, Barbara Cower, MD 07/07/18 [phone]

## 2018-07-07 NOTE — Op Note (Signed)
07/07/2018  5:56 AM  PATIENT:  Michael Mercado  78 y.o. male  PRE-OPERATIVE DIAGNOSIS: Large acute on chronic left subdural hematoma  POST-OPERATIVE DIAGNOSIS:  same  PROCEDURE: Left craniotomy for evacuation of subdural hematoma  SURGEON:  Marikay Alar, MD  ASSISTANTS: Verlin Dike FNP  ANESTHESIA:   General  EBL: 100 ml  Total I/O In: 1000 [I.V.:1000] Out: 225 [Urine:125; Blood:100]  BLOOD ADMINISTERED: none  DRAINS: Subgaleal and subdural drain  SPECIMEN:  none  INDICATION FOR PROCEDURE: This patient presented with large left subdural hematoma after a fall and on CT scan of the head at an outside hospital.  He was available.  The patient was unable to consent as he was intubated and comatose.  Recommended urgent craniotomy for evacuation of large left subdural hematoma.   PROCEDURE DETAILS: The patient was taken to the operating room and after induction of adequate generalized endotracheal anesthesia, the head was affixed in a 3 point Mayfield head rest, and turned to the right to expose the left frontotemporal parietal region. The head was shaved and then cleaned and then prepped with DuraPrep and draped in the usual sterile fashion. 10 cc of local anesthetic was injected, and a large question mark trauma craniotomy incision was made on the left of the head. Raney clips were placed to establish hemostasis of the scalp, the muscle was reflected with the scalp flap, to expose the the left parietal and temporal region. A burr hole was placed, and a craniotomy flap was turned utilizing the high-speed, air powered drill. The flap was then placed in bacitracin-containing saline solution, and the dura was opened to expose a large left acute on chronic subdural hematoma. A hematoma was then removed with a combination of irrigation and suction. I continued to irrigate until the irrigant was clear to, and dried any bleeding with bipolar cautery.  Was significant brain formation and  this was quite vascular and oozed requiring long periods of bipolar cautery.  We then helped drive a bed with Surgifoam.  Once everything looked dry I  placed a subdural drain through separate stab incision and close the dura with a running 4-0 Nurolon suture. Dural tack up sutures were placed. The dura was lined with Gelfoam, and the craniotomy flap was replaced with doggie-bone plates. The wound was copiously irrigated. A subgaleal drain was placed, and the galea was then closed with interrupted 2-0 Vicryl suture. The skin was then closed with staples a sterile dressing was applied. The patient was then taken out of the 3-point Mayfield headrest and awakened from general anesthesia, and transported to the recovery room in critical condition. At the end of the procedure all sponge, needle, and instrument counts were correct.    PLAN OF CARE: Admit to inpatient   PATIENT DISPOSITION:  ICU - intubated and critically ill.   Delay start of Pharmacological VTE agent (>24hrs) due to surgical blood loss or risk of bleeding:  yes

## 2018-07-07 NOTE — Consult Note (Addendum)
NAME:  Michael Mercado, MRN:  [id number], DOB:  [birth year], LOS: 0 ADMISSION DATE:  07/07/2018, CONSULTATION DATE:  07/07/2018 REFERRING MD:  Dr. Yetta Barre, CHIEF COMPLAINT:  SDH  Brief History   78 yoM w/MR from group home s/p fall x 2 found to have large SDH with 19 mm shift, intubated for airway protection and taken for emergent crani.  Returns to ICU intubated and sedated.   History of present illness   HPI obtained from medical chart review as patient is intubated and sedated on mechanical ventilation.    78 year old male with history of moderate intellectual disablity, tobacco abuse, COPD,  HTN, HLD, CKD stage II who presented to [facility] 3/18 after fall.  Apparently slipped and fell getting out of shower.  Denied LOC.  He was evaluated, asymptomatic, and sent home only to come back early hours of 3/19 after additional fall from bed around 0115.  LSW ~2200 on 3/18.  Presented unresponsive with right facial droop and hypertensive.  Code stroke activated.  CT head noted large subdural hematoma 19 mm of left-to-right shift.  He was intubated for airway protection and transferred emergently to [facility] for Neurosurgery evaluation.  He was taken to the OR for left craniotomy. EBL ~100.  He returns to the ICU postoperatively intubated and therefore PCCM consulted for vent management.   Past Medical History  moderate intellectual disablity, tobacco abuse, COPD,  HTN, HLD, CKD stage II,  Significant Hospital Events   3/18 Fall/ ER 3/19 Fall  Again/ SDH/ intubated/ Crani  Consults:  3/19 tele neuro  Procedures:  3/19 ETT >> 3/19 left craniotomy  3/19 R radial aline  >> 3/19 Foley >>  Significant Diagnostic Tests:  3/19 CTH >> 1. Large mixed attenuation left holo hemispheric subdural hematoma measuring up to 2.8 cm in maximal diameter. Associated extensive mass effect on the subjacent left cerebral hemisphere with up to 19 mm of left-to-right shift. Emergent neuro surgical consultation recommended.  2. Asymmetric dilatation of the right lateral ventricle, concerning for early ventricular trapping. 3. Small left occipital scalp contusion.  No calvarial fracture.  Micro Data:  3/19 MRSA PCR >>  Antimicrobials:  3/19 cefazolin preop  Interim history/subjective:  Returns on propofol gtt   Objective   Blood pressure (!) 171/91, pulse 93, temperature 98.1 F (36.7 C), resp. rate (!) 21, weight 80 kg, SpO2 100 %.    Vent Mode: PRVC FiO2 (%):  [100 %] 100 % Set Rate:  [18 bmp] 18 bmp Vt Set:  [510 mL] 510 mL PEEP:  [5 cmH20] 5 cmH20 Plateau Pressure:  [13 cmH20] 13 cmH20   Intake/Output Summary (Last 24 hours) at 07/07/2018 4132 Last data filed at 07/07/2018 4401 Gross per 24 hour  Intake 1600 ml  Output 225 ml  Net 1375 ml   Filed Weights   07/07/18 0206  Weight: 80 kg   Examination: General:  Older male sedated on MV on propofol 40 mcg/kg/min HEENT: MM pink/moist, ETT 8.0 at 24 cm, OGT, pupils 3/reactive, anicteric, Left crani dressing c/d/i Neuro: sedated  CV: RRR, no murmur PULM: even/non-labored on MV, lungs bilaterally coarse, no wheeze UU:VOZD, ND, bs active, foley  Extremities: cool/ dry, no LE edema  Skin: no rashes   Resolved Hospital Problem list    Assessment & Plan:  Large left SDH w/ 19 mm shift s/p crani P:  Per NSGY  cardene for SBP 100-140 PRN NPO Further imaging per NSGY  AED per NSGY  Ongoing neuro exams  Acute respiratory insufficiency  COPD Tobacco Abuse  P:  Full MV support CXR and ABG now VAP protocol  duonebs and pulmicort in lieu of home Breo Pepcid for SUP PAD protocol with propofol, prn fentanyl for RASS goal 0/-1 Hopeful to wean to extubate later this morning   HTN, HLD P:  Tele monitoring Hold ASA  Continue norvasc   CKD stage II P:  NS w/ KCL 20 meq at 75 ml Trend UOP/ renal function D/c foley when mental status improves  moderate intellectual disablity/ anxiety P:  risperdal and paxil per NSGY  Holding  klonopin   Best practice:  Diet: NPO, if not extubated today, start TF Pain/Anxiety/Delirium protocol (if indicated): propofol/ prn fent VAP protocol (if indicated): yes DVT prophylaxis: SCDs GI prophylaxis: PPI  Glucose control: CBG q 4 Mobility: BR Code Status: Full  Family Communication: no family at bedside Disposition: Neuro ICU   Labs   CBC: Recent Labs  Lab 07/07/18 0216  WBC 11.5*  NEUTROABS 10.2*  HGB 14.1  HCT 43.0  MCV 90.1  PLT 237    Basic Metabolic Panel: Recent Labs  Lab 07/07/18 0216 07/07/18 0253  NA 136  --   K 4.0  --   CL 103  --   CO2 24  --   GLUCOSE 143*  --   BUN 19  --   CREATININE 1.04 1.10  CALCIUM 9.4  --    GFR: Estimated Creatinine Clearance: 57.1 mL/min (by C-G formula based on SCr of 1.1 mg/dL). Recent Labs  Lab 07/07/18 0216  WBC 11.5*    Liver Function Tests: Recent Labs  Lab 07/07/18 0216  AST 23  ALT 16  ALKPHOS 94  BILITOT 0.5  PROT 7.9  ALBUMIN 4.3   No results for input(s): LIPASE, AMYLASE in the last 168 hours. No results for input(s): AMMONIA in the last 168 hours.  ABG No results found for: PHART, PCO2ART, PO2ART, HCO3, TCO2, ACIDBASEDEF, O2SAT   Coagulation Profile: Recent Labs  Lab 07/07/18 0216  INR 1.0    Cardiac Enzymes: No results for input(s): CKTOTAL, CKMB, CKMBINDEX, TROPONINI in the last 168 hours.  HbA1C: HB A1C (BAYER DCA - WAIVED)  Date/Time Value Ref Range Status  09/22/2017 01:29 PM 5.0 <7.0 % Final    Comment:                                          Diabetic Adult            <7.0                                       Healthy Adult        4.3 - 5.7                                                           (DCCT/NGSP) American Diabetes Association's Summary of Glycemic Recommendations for Adults with Diabetes: Hemoglobin A1c <7.0%. More stringent glycemic goals (A1c <6.0%) may further reduce complications at the cost of increased risk of hypoglycemia.     CBG: No  results for input(s):  GLUCAP in the last 168 hours.  Review of Systems:   Unable  Past Medical History  He,  has a past medical history of Hypercholesteremia, Hypertension, Moderate intellectual disability, Mood disorder (HCC), MR (mental retardation), and Obesity.   Surgical History   No past surgical history on file.   Social History   reports that he has been smoking cigarettes. He has been smoking about 0.33 packs per day. He has never used smokeless tobacco. He reports that he does not drink alcohol or use drugs.   Family History   His family history includes Stroke in his sister.   Allergies No Known Allergies   Home Medications  Prior to Admission medications   Medication Sig Start Date End Date Taking? Authorizing Provider  amLODipine (NORVASC) 10 MG tablet Take 1 tablet (10 mg total) by mouth daily. 01/12/18   Dettinger, Elige Radon, MD  aspirin 81 MG EC tablet TAKE 1 TABLET BY MOUTH ONCE DAILY. **DO NOT CRUSH** 10/25/17   Dettinger, Elige Radon, MD  BREO ELLIPTA 100-25 MCG/INH AEPB INHALE 1 PUFF ONCE DAILY. 06/23/18   Dettinger, Elige Radon, MD  clonazePAM (KLONOPIN) 0.5 MG tablet Take 0.5 mg by mouth 2 (two) times daily as needed for anxiety.    [provider]  COMPLETE ALLERGY MEDICINE 25 MG capsule TAKE 1 CAPSULE BY MOUTH DAILY AS NEEDED FOR ALLERGY SYMPTOMS. CONTACTNURSE IF SYMPTOMS WORSEN. 03/08/18   Dettinger, Elige Radon, MD  Emollient (LUBRICATING LOTION EX) Apply topically 2 (two) times daily.    [provider]  Emollient (MINERIN) LOTN APPLY TWICE DAILY 07/16/17   Dettinger, Elige Radon, MD  hydrocortisone cream 1 % APPLY TO THE AFFECTED AREA(s) THREE TIMES DAILY. 03/02/17   Dettinger, Elige Radon, MD  ibuprofen (ADVIL,MOTRIN) 400 MG tablet Take 400 mg by mouth every 8 (eight) hours as needed.    [provider]  loperamide (IMODIUM) 2 MG capsule Take by mouth as needed for diarrhea or loose stools.    [provider]  loperamide (IMODIUM) 2 MG  capsule TAKE 2 CAPSULES BY MOUTH AS NEEDED AFTER 2ND LOOSE STOOL REPEAT AFTERNEXT LOOSE STOOL MAX 3 DOSES, NOTIFY NURSE. 03/02/17   Dettinger, Elige Radon, MD  loperamide (IMODIUM) 2 MG capsule TAKE 2 CAPSULES BY MOUTH AS NEEDED AFTER 2ND LOOSE STOOL. TAKE 1 CAPSULE AFTER NEXT LOOSE STOOL MAX 3 DOSES, NOTIFY NURSE. 03/08/18   Dettinger, Elige Radon, MD  MAPAP 500 MG tablet TAKE 2 TABS BY MOUTH EVERY 4 HRS AS NEEDED FOR HEADACHE/MILD TO MODERATE PAIN OR TEMP OF 100.3 & ABOVE IF TEMP UNRESOLVED AFTER 24 HRS MUST 12/21/17   Dettinger, Elige Radon, MD  Neomycin-Bacitracin-Polymyxin (TRIPLE ANTIBIOTIC) [phone] OINT MINOR CUT/ABRASION:CLEAN W/ MILD SOAP/WATER THEN APPLY THIN LAYER TO AFFECTED AREA MAY COVER W/ BANDAID REMOVE AFTER 24 HRS NOTIFY NURSE OF 03/02/17   Dettinger, Elige Radon, MD  PARoxetine (PAXIL) 20 MG tablet Take 20 mg by mouth daily. Take at bedtime    [provider]  Polyethylene Glycol (LIP BALM BASE EX) Apply topically.    [provider]  QC ANTACID/ANTI-GAS [phone] MG/5ML suspension TAKE AS NEEDED FOR UPSET STOMACH. NO MORE THAN 3 DOSES IN 24 HOURS. NOTIFY NURSE IF SYMPTOMS PERSIST. 03/02/17   Dettinger, Elige Radon, MD  QC MILK OF MAGNESIA 400 MG/5ML suspension TAKE (2 TBLS)AS NEEDED AFTER 3 DAYS NO BM MAY REPEAT X3 DOSES IFNO RESOLUTION CALL NURSE (CONSTIPATION) 03/09/18   Dettinger, Elige Radon, MD  risperiDONE (RISPERDAL) 1 MG tablet Take 1  mg by mouth 2 (two) times daily.    [provider]  Sodium Bicarbonate (AFTER BITE EX) Apply topically as needed.    [provider]  Sunscreens (COPPERTONE SPORT SPF30 EX) Apply topically.    [provider]  TUSSIN 100 MG/5ML syrup TAKE (2TSP) BY MOUTH EVERY 6 HOURS AS NEEDED FOR COUGH/COLD MAX4 DOSES IN 24 HRS, NOTIFY NURSE IF PERSISTS. 07/16/17   Dettinger, Elige Radon, MD  VENTOLIN HFA 108 (90 Base) MCG/ACT inhaler INHALE (2) PUFFS BY MOUTH 4 TIMES DAILY AS NEEDED FOR SHORTNESS OF BREATH. 03/02/17    Dettinger, Elige Radon, MD     Critical care time: 35 mins    Posey Boyer, MSN, AGACNP-BC [facility] Pgr: [phone] or if no answer [phone] 07/07/2018, 6:37 AM

## 2018-07-07 NOTE — ED Notes (Signed)
LKW 2200, to CT at this time

## 2018-07-07 NOTE — Transfer of Care (Signed)
Immediate Anesthesia Transfer of Care Note  Patient: Michael Mercado  Procedure(s) Performed: CRANIOTOMY HEMATOMA EVACUATION SUBDURAL (Left Head)  Patient Location: ICU  Anesthesia Type:General  Level of Consciousness: sedated, unresponsive and Patient remains intubated per anesthesia plan  Airway & Oxygen Therapy: Patient remains intubated per anesthesia plan and Patient placed on Ventilator (see vital sign flow sheet for setting)  Post-op Assessment: Report given to RN and Post -op Vital signs reviewed and stable  Post vital signs: Reviewed and stable  Last Vitals:  Vitals Value Taken Time  BP    Temp    Pulse    Resp    SpO2      Last Pain:  Vitals:   07/07/18 0152  TempSrc: Oral         Complications: No apparent anesthesia complications

## 2018-07-07 NOTE — Anesthesia Preprocedure Evaluation (Signed)
Anesthesia Evaluation  Patient identified by MRN, date of birth, ID band Patient awake  Preop documentation limited or incomplete due to emergent nature of procedure.  Airway Mallampati: Intubated       Dental   Pulmonary Current Smoker,    Pulmonary exam normal        Cardiovascular hypertension, Pt. on medications Normal cardiovascular exam     Neuro/Psych Schizophrenia Subdual hematoma    GI/Hepatic   Endo/Other    Renal/GU      Musculoskeletal   Abdominal   Peds  Hematology   Anesthesia Other Findings   Reproductive/Obstetrics                             Anesthesia Physical Anesthesia Plan  ASA: IV and emergent  Anesthesia Plan: General   Post-op Pain Management:    Induction: Intravenous  PONV Risk Score and Plan: 1 and Treatment may vary due to age or medical condition  Airway Management Planned: Oral ETT  Additional Equipment: Arterial line  Intra-op Plan:   Post-operative Plan: Post-operative intubation/ventilation  Informed Consent: I have reviewed the patients History and Physical, chart, labs and discussed the procedure including the risks, benefits and alternatives for the proposed anesthesia with the patient or authorized representative who has indicated his/her understanding and acceptance.       Plan Discussed with: CRNA and Surgeon  Anesthesia Plan Comments:         Anesthesia Quick Evaluation

## 2018-07-07 NOTE — Consult Note (Signed)
[facility] TeleNeurology Consult Services    Date of Service:   07/07/2018 02:01:15  Impression:     .  SDH  Comments/Sign-Out: Acute on chronic left subdural hematoma with mass effect and midline shift. Coagulation studies pending. Likely traumatic. tPA not indicated. STAT NSU consult.  Metrics: Last Known Well: 07/06/2018 22:00:00 [facility] Notification Time: 07/07/2018 02:01:15 Arrival Time: 07/07/2018 01:53:00 Stamp Time: 07/07/2018 02:01:15 Time First Login Attempt: 07/07/2018 02:03:00 Video Start Time: 07/07/2018 02:03:00  Symptoms: right sided weakness NIHSS Start Assessment Time: 07/07/2018 02:08:00 Patient is not a candidate for tPA. Patient was not deemed candidate for tPA thrombolytics because of Current or Previous ICH. Video End Time: 07/07/2018 02:13:04  CT head was reviewed and results were: Left SDH with ML shift  Clinical Presentation is not Suggestive of Large Vessel Occlusive Disease, Patient is not a Candidate for Thrombectomy  ED Physician notified of diagnostic impression and management plan on 07/07/2018 02:13:07  Our recommendations are outlined below.  Recommendations:     .  Activate Stroke Protocol Admission/Order Set     .  Stroke/Telemetry Floor     .  Neuro Checks     .  Bedside Swallow Eval     .  DVT Prophylaxis     .  IV Fluids, Normal Saline     .  Head of Bed Below 30 Degrees     .  Euglycemia and Avoid Hyperthermia (PRN Acetaminophen)     .  Hold Antithrombotics for Now     .  STAT coags, correct coagulopathy if any   Sign Out:     .  Discussed with Emergency Department Provider    ------------------------------------------------------------------------------  History of Present Illness: Patient is a 78 year old Male.  Patient was brought by EMS for symptoms of right sided weakness  Seen in ER on 3/18 after ground level fall, no imaging, no deficit. Discharged from ER. Around 01:15 fell off bed and developed  right sided weakness. Patient unable to provide any history  CT head was reviewed.  There is history of hemorrhagic complications or intracranial hemorrhage.  Examination: 1A: Level of Consciousness - Alert; keenly responsive + 0 1B: Ask Month and Age - Could Not Answer Either Question Correctly + 2 1C: Blink Eyes & Squeeze Hands - Performs 0 Tasks + 2 2: Test Horizontal Extraocular Movements - Partial Gaze Palsy: Can Be Overcome + 1 3: Test Visual Fields - No Visual Loss + 0 4: Test Facial Palsy (Use Grimace if Obtunded) - Partial paralysis (lower face) + 2 5A: Test Left Arm Motor Drift - No Drift for 10 Seconds + 0 5B: Test Right Arm Motor Drift - No Movement + 4 6A: Test Left Leg Motor Drift - No Drift for 5 Seconds + 0 6B: Test Right Leg Motor Drift - Some Effort Against Gravity + 2 7: Test Limb Ataxia (FNF/Heel-Shin) - No Ataxia + 0 8: Test Sensation - Normal; No sensory loss + 0 9: Test Language/Aphasia - Mute/Global Aphasia: No Usable Speech/Auditory Comprehension + 3 10: Test Dysarthria - Mute/Anarthric + 2 11: Test Extinction/Inattention - Profound hemi-inattention (ex: does not recognize own hand) + 2  NIHSS Score: 20  Patient was informed the Neurology Consult would happen via TeleHealth consult by way of interactive audio and video telecommunications and consented to receiving care in this manner.  Due to the immediate potential for life-threatening deterioration due to underlying acute neurologic illness, I spent 35 minutes providing critical care. This time includes  time for face to face visit via telemedicine, review of medical records, imaging studies and discussion of findings with providers, the patient and/or family.   Dr Elige Radon   [facility] [phone]   Case [id number]

## 2018-07-07 NOTE — ED Provider Notes (Signed)
3:40 AM  Patient transferred from [facility] ED by Dr. Clayborne Dana.  Patient is a 78 year old male on aspirin who presented to the emergency department as a code stroke.  Found to have subdural hematoma with mass-effect and shift.  Intubated at [facility] and he is on propofol.  Came into the ED by [facility].  On my evaluation, neurosurgery is at bedside.  Appreciate their help.  They plan to take patient to the operating room now emergently.   Inocencia Murtaugh, Layla Maw, DO 07/07/18 [id number]

## 2018-07-07 NOTE — ED Notes (Signed)
Report to [facility] ED

## 2018-07-07 NOTE — Progress Notes (Signed)
NEUROSURGERY PROGRESS NOTE  78 year old presented to the ED at [facility] last night after sustaining a fall. He was discharged home. A couple hours later he was found down unresponsive at his group home then taken back to [facility]. CT head showed a very large 2.8cm mixed density left sided SDH with mass effect and left to right midline shift. Difficult to tell whether this is acute on chronic versus hyperacute blood. Exam difficulty upon ED arrival to [facility] as he was on propofol. Some flickering of his toes in both feet with noxious stimuli. Pupils reactive and brisk with anisocoria and a disconjugate gaze. No family present to discuss plan with. We are going to take him for an emergent left sided craniotomy for evacuation of subdural hematoma.   Temp:  [97.4 F (36.3 C)-98.1 F (36.7 C)] 98.1 F (36.7 C) (03/19 0249) Pulse Rate:  [71-97] 93 (03/19 0249) Resp:  [16-21] 21 (03/19 0249) BP: (145-179)/(49-102) 171/91 (03/19 0245) SpO2:  [92 %-100 %] 100 % (03/19 0249) FiO2 (%):  [100 %] 100 % (03/19 0230) Weight:  [80 kg] 80 kg (03/19 0206)   Sherryl Manges, NP 07/07/2018 4:11 AM

## 2018-07-08 ENCOUNTER — Inpatient Hospital Stay (HOSPITAL_COMMUNITY): Payer: Medicare Other

## 2018-07-08 ENCOUNTER — Encounter (HOSPITAL_COMMUNITY): Payer: Self-pay | Admitting: Neurological Surgery

## 2018-07-08 DIAGNOSIS — S065X9A Traumatic subdural hemorrhage with loss of consciousness of unspecified duration, initial encounter: Secondary | ICD-10-CM

## 2018-07-08 LAB — CBC
HCT: 32.5 % — ABNORMAL LOW (ref 39.0–52.0)
Hemoglobin: 10.5 g/dL — ABNORMAL LOW (ref 13.0–17.0)
MCH: 28.6 pg (ref 26.0–34.0)
MCHC: 32.3 g/dL (ref 30.0–36.0)
MCV: 88.6 fL (ref 80.0–100.0)
Platelets: 200 10*3/uL (ref 150–400)
RBC: 3.67 MIL/uL — AB (ref 4.22–5.81)
RDW: 13.4 % (ref 11.5–15.5)
WBC: 11.7 10*3/uL — ABNORMAL HIGH (ref 4.0–10.5)
nRBC: 0 % (ref 0.0–0.2)

## 2018-07-08 LAB — BASIC METABOLIC PANEL
Anion gap: 8 (ref 5–15)
BUN: 15 mg/dL (ref 8–23)
CHLORIDE: 112 mmol/L — AB (ref 98–111)
CO2: 19 mmol/L — AB (ref 22–32)
Calcium: 8.4 mg/dL — ABNORMAL LOW (ref 8.9–10.3)
Creatinine, Ser: 1.18 mg/dL (ref 0.61–1.24)
GFR calc Af Amer: >60 mL/min (ref >60)
GFR calc non Af Amer: 60 mL/min — ABNORMAL LOW (ref >60)
Glucose, Bld: 132 mg/dL — ABNORMAL HIGH (ref 70–99)
Potassium: 3.7 mmol/L (ref 3.5–5.1)
Sodium: 139 mmol/L (ref 135–145)

## 2018-07-08 LAB — PHOSPHORUS: Phosphorus: 2.4 mg/dL — ABNORMAL LOW (ref 2.5–4.6)

## 2018-07-08 LAB — MAGNESIUM: Magnesium: 2 mg/dL (ref 1.7–2.4)

## 2018-07-08 MED ORDER — IPRATROPIUM-ALBUTEROL 0.5-2.5 (3) MG/3ML IN SOLN: 3.0000 mL | RESPIRATORY_TRACT | Status: DC | PRN

## 2018-07-08 MED ORDER — HYDRALAZINE HCL 20 MG/ML IJ SOLN
10.0000 mg | INTRAMUSCULAR | Status: DC | PRN
  Administered 2018-07-11 – 2018-07-12 (×3): 20 mg via INTRAVENOUS
  Filled 2018-07-08 (×3): qty 1

## 2018-07-08 MED FILL — Gelatin Absorbable MT Powder: OROMUCOSAL | Qty: 1 | Status: AC

## 2018-07-08 MED FILL — Thrombin For Soln 5000 Unit: CUTANEOUS | Qty: 5000 | Status: AC

## 2018-07-08 NOTE — Progress Notes (Signed)
CSW made aware that pt is from a Group Home. CSW reached out to [name] [phone]. CSW unable to leave message as voicemail is full. CSW will try [name] again later for further needs of pt.     Claude Manges Ilan Kahrs, MSW, LCSW-A Emergency Department Clinical Social Worker [phone]

## 2018-07-08 NOTE — Progress Notes (Signed)
NAME:  Michael Mercado, MRN:  [id number], DOB:  Aug 23, 1940, LOS: 1 ADMISSION DATE:  07/07/2018, CONSULTATION DATE:  07/07/2018 REFERRING MD:  Dr. Yetta Barre, CHIEF COMPLAINT:  SDH  Brief History   66 yoM w/MR from group home s/p fall x 2 found to have large SDH with 19 mm shift, intubated for airway protection and taken for emergent crani. Initially required post op vent for a few hours, now extubated.   Past Medical History  moderate intellectual disablity, tobacco abuse, COPD,  HTN, HLD, CKD stage II,  Significant Hospital Events   3/18 Fall/ ER 3/19 Fall  Again/ SDH/ intubated/ Crani  Consults:  3/19 tele neuro  Procedures:  3/19 ETT > 3/19 3/19 left craniotomy  3/19 R radial aline > 3/20 3/19 Foley > 3/20  Significant Diagnostic Tests:  3/19 CTH >> Large mixed attenuation left holo hemispheric subdural hematoma measuring up to 2.8 cm in maximal diameter. Associated extensive mass effect on the subjacent left cerebral hemisphere with up to 19 mm of left-to-right shift. Emergent neuro surgical consultation recommended. Asymmetric dilatation of the right lateral ventricle, concerning for early ventricular trapping. Small left occipital scalp contusion.  No calvarial fracture.  Micro Data:  3/19 MRSA PCR >>  Antimicrobials:  3/19 cefazolin preop  Interim history/subjective:  Extubated yesterday and tolerating well. More awake today.   Objective   Blood pressure 112/62, pulse 69, temperature 99.3 F (37.4 C), resp. rate 20, height 5' 6.25" (1.683 m), weight 77.5 kg, SpO2 96 %.    Vent Mode: CPAP;PSV FiO2 (%):  [40 %-60 %] 40 % PEEP:  [5 cmH20] 5 cmH20 Pressure Support:  [5 cmH20] 5 cmH20   Intake/Output Summary (Last 24 hours) at 07/08/2018 0852 Last data filed at 07/08/2018 0700 Gross per 24 hour  Intake 3761.12 ml  Output 2005 ml  Net 1756.12 ml   Filed Weights   07/07/18 0206 07/07/18 0615  Weight: 80 kg 77.5 kg   Examination: General:  Elderly male resting  comfortably.  HEENT: [place], drains and dressings in place CDI Neuro: drowsy but arouses and follows commands x 4.  CV: RRR, no murmur PULM: clear, no wheeze.  WU:JWJX, ND, bs active Extremities: No acute deformity or ROM limitation Skin: no rashes   Resolved Hospital Problem list    Assessment & Plan:  Large left SDH w/ 19 mm shift s/p crani P:  Per NSGY  Off nicardipine this AM NPO SLP, PT, OT eval Keppra per NSGY  Neuro checks  COPD Tobacco Abuse  P:  Unable to resume home Breo at this time. Once he wakes up more.  Duoneb Pepcid for SUP  HTN, HLD P:  Tele monitoring Holding oral agents due to swallowing concerns.  Off nicardipine PRN labetalol and hydralazine.   CKD stage II P:  NS w/ KCL 20 meq at 75 ml Trend UOP/ renal function DC foley  moderate intellectual disablity/ anxiety P:  Holding home risperdal and paxil klonopin while NPO.   If not taking PO by tomorrow will need to replace some of these IV or place Cortrak.   Best practice:  Diet: NPO pending SLP Pain/Anxiety/Delirium protocol (if indicated): n/a VAP protocol (if indicated): n/a DVT prophylaxis: SCDs GI prophylaxis: PPI  Glucose control: CBG q 4 Mobility: BR Code Status: Full  Family Communication: no family at bedside Disposition: Neuro ICU   Labs   CBC: Recent Labs  Lab 07/07/18 0216 07/07/18 0503 07/08/18 0536  WBC 11.5*  --  11.7*  NEUTROABS  10.2*  --   --   HGB 14.1 10.9* 10.5*  HCT 43.0 32.0* 32.5*  MCV 90.1  --  88.6  PLT 237  --  200    Basic Metabolic Panel: Recent Labs  Lab 07/07/18 0216 07/07/18 0253 07/07/18 0503 07/08/18 0536  NA 136  --  138 139  K 4.0  --  3.6 3.7  CL 103  --   --  112*  CO2 24  --   --  19*  GLUCOSE 143*  --   --  132*  BUN 19  --   --  15  CREATININE 1.04 1.10  --  1.18  CALCIUM 9.4  --   --  8.4*  MG  --   --   --  2.0  PHOS  --   --   --  2.4*   GFR: Estimated Creatinine Clearance: 52.4 mL/min (by C-G formula based on SCr of  1.18 mg/dL). Recent Labs  Lab 07/07/18 0216 07/08/18 0536  WBC 11.5* 11.7*    Liver Function Tests: Recent Labs  Lab 07/07/18 0216  AST 23  ALT 16  ALKPHOS 94  BILITOT 0.5  PROT 7.9  ALBUMIN 4.3   No results for input(s): LIPASE, AMYLASE in the last 168 hours. No results for input(s): AMMONIA in the last 168 hours.  ABG    Component Value Date/Time   PHART 7.337 (L) 07/07/2018 0503   PCO2ART 44.2 07/07/2018 0503   PO2ART 436.0 (H) 07/07/2018 0503   HCO3 24.1 07/07/2018 0503   TCO2 25 07/07/2018 0503   ACIDBASEDEF 2.0 07/07/2018 0503   O2SAT 100.0 07/07/2018 0503     Coagulation Profile: Recent Labs  Lab 07/07/18 0216  INR 1.0    Cardiac Enzymes: No results for input(s): CKTOTAL, CKMB, CKMBINDEX, TROPONINI in the last 168 hours.  HbA1C: HB A1C (BAYER DCA - WAIVED)  Date/Time Value Ref Range Status  09/22/2017 01:29 PM 5.0 <7.0 % Final    Comment:                                          Diabetic Adult            <7.0                                       Healthy Adult        4.3 - 5.7                                                           (DCCT/NGSP) American Diabetes Association's Summary of Glycemic Recommendations for Adults with Diabetes: Hemoglobin A1c <7.0%. More stringent glycemic goals (A1c <6.0%) may further reduce complications at the cost of increased risk of hypoglycemia.     CBG: No results for input(s): GLUCAP in the last 168 hours.  Review of Systems:   Unable  Past Medical History  He,  has a past medical history of Hypercholesteremia, Hypertension, Moderate intellectual disability, Mood disorder (HCC), MR (mental retardation), and Obesity.   Surgical History   No past surgical history on file.   Social  History   reports that he has been smoking cigarettes. He has been smoking about 0.33 packs per day. He has never used smokeless tobacco. He reports that he does not drink alcohol or use drugs.   Family History   His family  history includes Stroke in his sister.   Allergies No Known Allergies   Home Medications  Prior to Admission medications   Medication Sig Start Date End Date Taking? Authorizing Provider  amLODipine (NORVASC) 10 MG tablet Take 1 tablet (10 mg total) by mouth daily. 01/12/18   Dettinger, Elige Radon, MD  aspirin 81 MG EC tablet TAKE 1 TABLET BY MOUTH ONCE DAILY. **DO NOT CRUSH** 10/25/17   Dettinger, Elige Radon, MD  BREO ELLIPTA 100-25 MCG/INH AEPB INHALE 1 PUFF ONCE DAILY. 06/23/18   Dettinger, Elige Radon, MD  clonazePAM (KLONOPIN) 0.5 MG tablet Take 0.5 mg by mouth 2 (two) times daily as needed for anxiety.    [provider]  COMPLETE ALLERGY MEDICINE 25 MG capsule TAKE 1 CAPSULE BY MOUTH DAILY AS NEEDED FOR ALLERGY SYMPTOMS. CONTACTNURSE IF SYMPTOMS WORSEN. 03/08/18   Dettinger, Elige Radon, MD  Emollient (LUBRICATING LOTION EX) Apply topically 2 (two) times daily.    [provider]  Emollient (MINERIN) LOTN APPLY TWICE DAILY 07/16/17   Dettinger, Elige Radon, MD  hydrocortisone cream 1 % APPLY TO THE AFFECTED AREA(s) THREE TIMES DAILY. 03/02/17   Dettinger, Elige Radon, MD  ibuprofen (ADVIL,MOTRIN) 400 MG tablet Take 400 mg by mouth every 8 (eight) hours as needed.    [provider]  loperamide (IMODIUM) 2 MG capsule Take by mouth as needed for diarrhea or loose stools.    [provider]  loperamide (IMODIUM) 2 MG capsule TAKE 2 CAPSULES BY MOUTH AS NEEDED AFTER 2ND LOOSE STOOL REPEAT AFTERNEXT LOOSE STOOL MAX 3 DOSES, NOTIFY NURSE. 03/02/17   Dettinger, Elige Radon, MD  loperamide (IMODIUM) 2 MG capsule TAKE 2 CAPSULES BY MOUTH AS NEEDED AFTER 2ND LOOSE STOOL. TAKE 1 CAPSULE AFTER NEXT LOOSE STOOL MAX 3 DOSES, NOTIFY NURSE. 03/08/18   Dettinger, Elige Radon, MD  MAPAP 500 MG tablet TAKE 2 TABS BY MOUTH EVERY 4 HRS AS NEEDED FOR HEADACHE/MILD TO MODERATE PAIN OR TEMP OF 100.3 & ABOVE IF TEMP UNRESOLVED AFTER 24 HRS MUST 12/21/17   Dettinger, Elige Radon, MD   Neomycin-Bacitracin-Polymyxin (TRIPLE ANTIBIOTIC) [phone] OINT MINOR CUT/ABRASION:CLEAN W/ MILD SOAP/WATER THEN APPLY THIN LAYER TO AFFECTED AREA MAY COVER W/ BANDAID REMOVE AFTER 24 HRS NOTIFY NURSE OF 03/02/17   Dettinger, Elige Radon, MD  PARoxetine (PAXIL) 20 MG tablet Take 20 mg by mouth daily. Take at bedtime    [provider]  Polyethylene Glycol (LIP BALM BASE EX) Apply topically.    [provider]  QC ANTACID/ANTI-GAS [phone] MG/5ML suspension TAKE AS NEEDED FOR UPSET STOMACH. NO MORE THAN 3 DOSES IN 24 HOURS. NOTIFY NURSE IF SYMPTOMS PERSIST. 03/02/17   Dettinger, Elige Radon, MD  QC MILK OF MAGNESIA 400 MG/5ML suspension TAKE (2 TBLS)AS NEEDED AFTER 3 DAYS NO BM MAY REPEAT X3 DOSES IFNO RESOLUTION CALL NURSE (CONSTIPATION) 03/09/18   Dettinger, Elige Radon, MD  risperiDONE (RISPERDAL) 1 MG tablet Take 1 mg by mouth 2 (two) times daily.    [provider]  Sodium Bicarbonate (AFTER BITE EX) Apply topically as needed.    [provider]  Sunscreens (COPPERTONE SPORT SPF30 EX) Apply topically.    [provider]  TUSSIN 100 MG/5ML syrup TAKE (2TSP) BY MOUTH  EVERY 6 HOURS AS NEEDED FOR COUGH/COLD MAX4 DOSES IN 24 HRS, NOTIFY NURSE IF PERSISTS. 07/16/17   Dettinger, Elige Radon, MD  VENTOLIN HFA 108 (90 Base) MCG/ACT inhaler INHALE (2) PUFFS BY MOUTH 4 TIMES DAILY AS NEEDED FOR SHORTNESS OF BREATH. 03/02/17   Dettinger, Elige Radon, MD     Critical care time: 35 mins     Joneen Roach, AGACNP-BC [facility] Pulmonary/Critical Care Pager [phone] or [phone]  07/08/2018 9:15 AM

## 2018-07-08 NOTE — Progress Notes (Signed)
Subjective: Patient reports patient improved awakens to stim moves all externally is well  Objective: Vital signs in last 24 hours: Temp:  [97.5 F (36.4 C)-101.1 F (38.4 C)] 99.3 F (37.4 C) (03/20 0745) Pulse Rate:  [69-96] 80 (03/20 0745) Resp:  [9-21] 17 (03/20 0745) BP: (100-145)/(40-99) 122/76 (03/20 0745) SpO2:  [90 %-100 %] 98 % (03/20 0802) Arterial Line BP: (122-156)/(44-59) 134/52 (03/20 0745) FiO2 (%):  [40 %-60 %] 40 % (03/19 1110)  Intake/Output from previous day: 03/19 0701 - 03/20 0700 In: 4281.6 [P.O.:4; I.V.:3977.6; IV Piggyback:300] Out: 2130 [Urine:1545; Emesis/NG output:300; Drains:285] Intake/Output this shift: No intake/output data recorded.  purposeful bilaterally 5 out of 5 strength localizes but does not follow commands  Lab Results: Recent Labs    07/07/18 0216 07/07/18 0503 07/08/18 0536  WBC 11.5*  --  11.7*  HGB 14.1 10.9* 10.5*  HCT 43.0 32.0* 32.5*  PLT 237  --  200   BMET Recent Labs    07/07/18 0216 07/07/18 0253 07/07/18 0503 07/08/18 0536  NA 136  --  138 139  K 4.0  --  3.6 3.7  CL 103  --   --  112*  CO2 24  --   --  19*  GLUCOSE 143*  --   --  132*  BUN 19  --   --  15  CREATININE 1.04 1.10  --  1.18  CALCIUM 9.4  --   --  8.4*    Studies/Results: Ct Head Wo Contrast  Result Date: 07/08/2018 CLINICAL DATA:  Follow-up subdural hematoma EXAM: CT HEAD WITHOUT CONTRAST TECHNIQUE: Contiguous axial images were obtained from the base of the skull through the vertex without intravenous contrast. COMPARISON:  Yesterday FINDINGS: Brain: Interval decompression of subdural hematoma via craniotomy. Subdural drain is present. The collection has decreased in size, where residual high-density blood products, gas, and hemostatic material cause a residual collection measuring up to 2 cm in thickness at the frontal convexity. Midline shift has decreased to 1 cm. No entrapment or acute infarct is noted. Vascular: Atherosclerotic  calcification Skull: Unremarkable craniotomy Sinuses/Orbits: Negative IMPRESSION: Interval decompression of left subdural hematoma. The collection measures up to 2 cm. Midline shift has improved to 1 cm. Electronically Signed   By: Marnee Spring M.D.   On: 07/08/2018 05:36   Dg Chest Port 1 View  Result Date: 07/08/2018 CLINICAL DATA:  Status post extubation.  Hypertension. EXAM: PORTABLE CHEST 1 VIEW COMPARISON:  July 07, 2018 FINDINGS: Endotracheal tube and nasogastric tube have been removed. No pneumothorax. There is no appreciable edema or consolidation. Heart is upper normal in size with pulmonary vascularity normal. No adenopathy. No bone lesions. IMPRESSION: No pneumothorax. No edema or consolidation. Heart upper normal in size. Electronically Signed   By: Bretta Bang III M.D.   On: 07/08/2018 07:46   Dg Chest Port 1 View  Result Date: 07/07/2018 CLINICAL DATA:  ET tube present EXAM: PORTABLE CHEST 1 VIEW COMPARISON:  Portable film earlier today. FINDINGS: Tubes and lines remain stable. No consolidation or edema. Improved LEFT lower lobe atelectasis. IMPRESSION: Stable to improved chest. Electronically Signed   By: Elsie Stain M.D.   On: 07/07/2018 07:26   Dg Chest Portable 1 View  Result Date: 07/07/2018 CLINICAL DATA:  Post intubation and OG tube placement. Hx of HTN and current smoker. EXAM: PORTABLE CHEST 1 VIEW COMPARISON:  None. FINDINGS: Endotracheal tube tip projects 4.9 cm above the carina. Nasal/orogastric tube passes below the diaphragm and below the  included field of view. Cardiac silhouette is normal in size. No mediastinal or hilar masses. There are prominent bronchovascular markings most evident at the bases with additional lung base opacity at likely due to atelectasis. No convincing pneumonia. No evidence of pulmonary edema. No pleural effusion or pneumothorax. Skeletal structures are grossly intact. IMPRESSION: 1. Endotracheal tube tip projects 4.9 cm above the carina.  Nasal/orogastric tube passes below the diaphragm into the stomach. 2. No acute cardiopulmonary disease. Electronically Signed   By: Amie Portland M.D.   On: 07/07/2018 02:45   Ct Head Code Stroke Wo Contrast  Result Date: 07/07/2018 CLINICAL DATA:  Code stroke. Initial evaluation for acute sudden onset weakness, prior fall. EXAM: CT HEAD WITHOUT CONTRAST TECHNIQUE: Contiguous axial images were obtained from the base of the skull through the vertex without intravenous contrast. COMPARISON:  None. FINDINGS: Brain: Large mixed attenuation left subdural hemorrhage overlies the left cerebral convexity, measuring up to 2.8 cm in maximal diameter at the left frontal convexity. Scattered acute blood products present within this collection. Associated mass effect on the subjacent left cerebral hemisphere with up to 19 mm of left-to-right shift. Left lateral ventricle partially effaced. Asymmetric dilatation of the right lateral ventricle concerning for ventricular trapping. Basilar cisterns partially effaced but remain patent at this time. No other acute intracranial hemorrhage. No large vessel territory infarct. No appreciable mass lesion. Vascular: No hyperdense vessel. Skull: Oval small scalp contusion at the left occipital scalp. No calvarial fracture. Sinuses/Orbits: Globes and orbital soft tissues demonstrate no acute finding. Paranasal sinuses and mastoid air cells are clear. Other: None. IMPRESSION: 1. Large mixed attenuation left holo hemispheric subdural hematoma measuring up to 2.8 cm in maximal diameter. Associated extensive mass effect on the subjacent left cerebral hemisphere with up to 19 mm of left-to-right shift. Emergent neuro surgical consultation recommended. 2. Asymmetric dilatation of the right lateral ventricle, concerning for early ventricular trapping. 3. Small left occipital scalp contusion.  No calvarial fracture. Critical Value/emergent results were called by telephone at the time of  interpretation on 07/07/2018 at 2:07 am to Dr. Marily Memos , who verbally acknowledged these results. Electronically Signed   By: Rise Mu M.D.   On: 07/07/2018 02:14    Assessment/Plan: Postop day 1 subdural evacuationsignificant improvement and midline shift still about 1 cm with a 2 cm mixed density fluid collection. Patient clinically is much better I would not recommend re-evacuation at this point continue to follow serial neurologic exams and serial CTs he has a J-P drain in the subdural space and a Hemovac in the subgaleal space.  LOS: 1 day     Stacey Maura P 07/08/2018, 8:31 AM

## 2018-07-09 DIAGNOSIS — J449 Chronic obstructive pulmonary disease, unspecified: Secondary | ICD-10-CM

## 2018-07-09 DIAGNOSIS — N182 Chronic kidney disease, stage 2 (mild): Secondary | ICD-10-CM

## 2018-07-09 DIAGNOSIS — R4189 Other symptoms and signs involving cognitive functions and awareness: Secondary | ICD-10-CM

## 2018-07-09 DIAGNOSIS — Z978 Presence of other specified devices: Secondary | ICD-10-CM

## 2018-07-09 LAB — BASIC METABOLIC PANEL WITH GFR
Anion gap: 8 (ref 5–15)
BUN: 17 mg/dL (ref 8–23)
CO2: 20 mmol/L — ABNORMAL LOW (ref 22–32)
Calcium: 8.5 mg/dL — ABNORMAL LOW (ref 8.9–10.3)
Chloride: 113 mmol/L — ABNORMAL HIGH (ref 98–111)
Creatinine, Ser: 1.06 mg/dL (ref 0.61–1.24)
GFR calc Af Amer: >60 mL/min
GFR calc non Af Amer: >60 mL/min
Glucose, Bld: 96 mg/dL (ref 70–99)
Potassium: 3.8 mmol/L (ref 3.5–5.1)
Sodium: 141 mmol/L (ref 135–145)

## 2018-07-09 LAB — CBC
HCT: 32.1 % — ABNORMAL LOW (ref 39.0–52.0)
Hemoglobin: 10.5 g/dL — ABNORMAL LOW (ref 13.0–17.0)
MCH: 28.7 pg (ref 26.0–34.0)
MCHC: 32.7 g/dL (ref 30.0–36.0)
MCV: 87.7 fL (ref 80.0–100.0)
PLATELETS: 194 10*3/uL (ref 150–400)
RBC: 3.66 MIL/uL — ABNORMAL LOW (ref 4.22–5.81)
RDW: 13.4 % (ref 11.5–15.5)
WBC: 10.3 10*3/uL (ref 4.0–10.5)
nRBC: 0 % (ref 0.0–0.2)

## 2018-07-09 LAB — GLUCOSE, CAPILLARY
Glucose-Capillary: 123 mg/dL — ABNORMAL HIGH (ref 70–99)
Glucose-Capillary: 123 mg/dL — ABNORMAL HIGH (ref 70–99)
Glucose-Capillary: 132 mg/dL — ABNORMAL HIGH (ref 70–99)

## 2018-07-09 MED ORDER — ACETAMINOPHEN 160 MG/5ML PO SOLN
650.0000 mg | ORAL | Status: DC | PRN
  Administered 2018-07-09 – 2018-07-12 (×4): 650 mg
  Filled 2018-07-09 (×4): qty 20.3

## 2018-07-09 MED ORDER — JEVITY 1.2 CAL PO LIQD
1000.0000 mL | ORAL | Status: DC
  Administered 2018-07-09 – 2018-07-12 (×5): 1000 mL
  Filled 2018-07-09 (×9): qty 1000

## 2018-07-09 NOTE — Progress Notes (Signed)
PULMONARY / CRITICAL CARE MEDICINE   NAME:  Michael Mercado, MRN:  [id number], DOB:  02/10/1941, LOS: 2 ADMISSION DATE:  07/07/2018, CONSULTATION DATE:  07/07/2018 REFERRING MD:  Yetta Barre, CHIEF COMPLAINT:  Subdural hematoma  BRIEF HISTORY:    This is a 78 yoM w/MR from group home s/p fall x 2 found to have large L SDH with 19 mm shift, intubated for airway protection and taken for emergent crani. Initially required post op vent for a few hours, now extubated.   HISTORY OF PRESENT ILLNESS   78 year old male with history of moderate intellectual disablity, tobacco abuse, COPD,  HTN, HLD, CKD stage II who presented to [facility] 3/18 after fall.  Apparently slipped and fell getting out of shower.  Denied LOC.  He was evaluated, asymptomatic, and sent home only to come back early hours of 3/19 after additional fall from bed around 0115.  LSW ~2200 on 3/18.  Presented unresponsive with right facial droop and hypertensive.  Code stroke activated.  CT head noted large subdural hematoma 19 mm of left-to-right shift.  He was intubated for airway protection and transferred emergently to [facility] for Neurosurgery evaluation.  He was taken to the OR for left craniotomy. EBL ~100.  He returns to the ICU postoperatively intubated and therefore PCCM was consulted for vent management. SIGNIFICANT PAST MEDICAL HISTORY   moderate intellectual disablity, tobacco abuse, COPD,  HTN, HLD, CKD stage II,  SIGNIFICANT EVENTS:  3/18 Fall/ ER 3/19 Fall  Again/ SDH/ intubated/ Crani STUDIES:   3/19 CTH >> Large mixed attenuation left holo hemispheric subdural hematoma measuring up to 2.8 cm in maximal diameter. Associated extensive mass effect on the subjacent left cerebral hemisphere with up to 19 mm of left-to-right shift. Emergent neuro surgical consultation recommended. Asymmetric dilatation of the right lateral ventricle, concerning for early ventricular trapping. Small left occipital scalp contusion. No calvarial  fracture. CULTURES:  3/19 MRSA PCR  Negative  ANTIBIOTICS:  Cefzolin pre-op  LINES/TUBES:   3/19 ETT > 3/19 3/19 left craniotomy  3/19 R radial aline > 3/20 3/19 Foley > 3/20 CONSULTANTS:  Neurosurg SUBJECTIVE:  Opens eyes to verbal stimuli but disoriented  CONSTITUTIONAL: BP (!) 148/66   Pulse 77   Temp (!) 100.7 F (38.2 C) (Axillary)   Resp 16   Ht 5' 6.25" (1.683 m)   Wt 77.5 kg   SpO2 97%   BMI 27.37 kg/m   I/O last 3 completed shifts: In: 4129 [I.V.:3738.9; Other:40; IV Piggyback:350] Out: 1925 [Urine:1840; Drains:85]        PHYSICAL EXAM: General:  WD/WN NAD Neuro:  As above HEENT:  [place]; dressings in place Cardiovascular:  RRR, no m/r/g Lungs:  Clear bilaterally Abdomen:  Supple, no guarding, +BS Musculoskeletal:  No active joints Skin:  No C/C/E  RESOLVED PROBLEM LIST   ASSESSMENT AND PLAN   1. L SDH.  Management per Neurosurg  2. Hx COPD. Cont. Duonebs.   3. CKD stage II. BUN/Cr wnl. Trend  4. HTN. Due to concerns with swallowing, prn antihypertensives. SUMMARY OF TODAY'S PLAN:  As above  Best Practice / Goals of Care / Disposition.   DVT PROPHYLAXIS:SCDs NUTRITION:Jevity TF MOBILITY:Up in chair DISPOSITION Progressive care with improvement in MS  LABS  Glucose No results for input(s): GLUCAP in the last 168 hours.  BMET Recent Labs  Lab 07/07/18 0216 07/07/18 0253 07/07/18 0503 07/08/18 0536 07/09/18 0137  NA 136  --  138 139 141  K 4.0  --  3.6  3.7 3.8  CL 103  --   --  112* 113*  CO2 24  --   --  19* 20*  BUN 19  --   --  15 17  CREATININE 1.04 1.10  --  1.18 1.06  GLUCOSE 143*  --   --  132* 96    Liver Enzymes Recent Labs  Lab 07/07/18 0216  AST 23  ALT 16  ALKPHOS 94  BILITOT 0.5  ALBUMIN 4.3    Electrolytes Recent Labs  Lab 07/07/18 0216 07/08/18 0536 07/09/18 0137  CALCIUM 9.4 8.4* 8.5*  MG  --  2.0  --   PHOS  --  2.4*  --     CBC Recent Labs  Lab 07/07/18 0216 07/07/18 0503  07/08/18 0536 07/09/18 0137  WBC 11.5*  --  11.7* 10.3  HGB 14.1 10.9* 10.5* 10.5*  HCT 43.0 32.0* 32.5* 32.1*  PLT 237  --  200 194    ABG Recent Labs  Lab 07/07/18 0503  PHART 7.337*  PCO2ART 44.2  PO2ART 436.0*    Coag's Recent Labs  Lab 07/07/18 0216  APTT 29  INR 1.0    Sepsis Markers No results for input(s): LATICACIDVEN, PROCALCITON, O2SATVEN in the last 168 hours.  Cardiac Enzymes No results for input(s): TROPONINI, PROBNP in the last 168 hours.  PAST MEDICAL HISTORY :   He  has a past medical history of Hypercholesteremia, Hypertension, Moderate intellectual disability, Mood disorder (HCC), MR (mental retardation), and Obesity.  PAST SURGICAL HISTORY:  He  has a past surgical history that includes Craniotomy (Left, 07/07/2018).  No Known Allergies  No current facility-administered medications on file prior to encounter.    Current Outpatient Medications on File Prior to Encounter  Medication Sig  . amLODipine (NORVASC) 10 MG tablet Take 1 tablet (10 mg total) by mouth daily.  [name] aspirin 81 MG EC tablet TAKE 1 TABLET BY MOUTH ONCE DAILY. **DO NOT CRUSH** (Patient taking differently: Take 81 mg by mouth daily. )  . BREO ELLIPTA 100-25 MCG/INH AEPB INHALE 1 PUFF ONCE DAILY. (Patient taking differently: Inhale 1 puff into the lungs daily. )  . clonazePAM (KLONOPIN) 0.5 MG tablet Take 0.5 mg by mouth daily as needed for anxiety.   . COMPLETE ALLERGY MEDICINE 25 MG capsule TAKE 1 CAPSULE BY MOUTH DAILY AS NEEDED FOR ALLERGY SYMPTOMS. CONTACTNURSE IF SYMPTOMS WORSEN. (Patient taking differently: Take 25 mg by mouth daily as needed for allergies. )  . Emollient (MINERIN) LOTN APPLY TWICE DAILY  . hydrocortisone cream 1 % APPLY TO THE AFFECTED AREA(s) THREE TIMES DAILY. (Patient taking differently: Apply 1 application topically 3 (three) times daily. )  . loperamide (IMODIUM) 2 MG capsule TAKE 2 CAPSULES BY MOUTH AS NEEDED AFTER 2ND LOOSE STOOL REPEAT AFTERNEXT LOOSE  STOOL MAX 3 DOSES, NOTIFY NURSE. (Patient taking differently: Take 4 mg by mouth as needed for diarrhea or loose stools. )  . MAPAP 500 MG tablet TAKE 2 TABS BY MOUTH EVERY 4 HRS AS NEEDED FOR HEADACHE/MILD TO MODERATE PAIN OR TEMP OF 100.3 & ABOVE IF TEMP UNRESOLVED AFTER 24 HRS MUST (Patient taking differently: Take 1,000 mg by mouth every 4 (four) hours as needed for moderate pain or headache. )  . Neomycin-Bacitracin-Polymyxin (TRIPLE ANTIBIOTIC) 3.5-5316045125 OINT MINOR CUT/ABRASION:CLEAN W/ MILD SOAP/WATER THEN APPLY THIN LAYER TO AFFECTED AREA MAY COVER W/ BANDAID REMOVE AFTER 24 HRS NOTIFY NURSE OF (Patient taking differently: Apply 1 application topically as needed (cuts or abrasion). )  .  PARoxetine (PAXIL) 20 MG tablet Take 20 mg by mouth at bedtime.   . Polyethylene Glycol (LIP BALM BASE EX) Apply topically.  . QC ANTACID/ANTI-GAS 570-177-93 MG/5ML suspension TAKE AS NEEDED FOR UPSET STOMACH. NO MORE THAN 3 DOSES IN 24 HOURS. NOTIFY NURSE IF SYMPTOMS PERSIST. (Patient taking differently: Take 10 mLs by mouth as needed for indigestion. )  . QC MILK OF MAGNESIA 400 MG/5ML suspension TAKE (2 TBLS)AS NEEDED AFTER 3 DAYS NO BM MAY REPEAT X3 DOSES IFNO RESOLUTION CALL NURSE (CONSTIPATION) (Patient taking differently: Take 30 mLs by mouth as needed for mild constipation. )  . risperiDONE (RISPERDAL) 1 MG tablet Take 1 mg by mouth 2 (two) times daily.  . Sodium Bicarbonate (AFTER BITE EX) Apply topically as needed.  . Sunscreens (COPPERTONE SPORT SPF30 EX) Apply topically.  . TUSSIN 100 MG/5ML syrup TAKE (2TSP) BY MOUTH EVERY 6 HOURS AS NEEDED FOR COUGH/COLD MAX4 DOSES IN 24 HRS, NOTIFY NURSE IF PERSISTS. (Patient taking differently: Take 200 mg by mouth every 6 (six) hours as needed for cough. )  . VENTOLIN HFA 108 (90 Base) MCG/ACT inhaler INHALE (2) PUFFS BY MOUTH 4 TIMES DAILY AS NEEDED FOR SHORTNESS OF BREATH. (Patient taking differently: Inhale 2 puffs into the lungs every 4  (four) hours as needed for wheezing or shortness of breath. )  . Emollient (LUBRICATING LOTION EX) Apply topically 2 (two) times daily.  [name] ibuprofen (ADVIL,MOTRIN) 400 MG tablet Take 400 mg by mouth every 8 (eight) hours as needed.  . loperamide (IMODIUM) 2 MG capsule Take by mouth as needed for diarrhea or loose stools.  [name] loperamide (IMODIUM) 2 MG capsule TAKE 2 CAPSULES BY MOUTH AS NEEDED AFTER 2ND LOOSE STOOL. TAKE 1 CAPSULE AFTER NEXT LOOSE STOOL MAX 3 DOSES, NOTIFY NURSE. (Patient not taking: Reported on 07/07/2018)    FAMILY HISTORY:   His family history includes Stroke in his sister.  SOCIAL HISTORY:  He  reports that he has been smoking cigarettes. He has been smoking about 0.33 packs per day. He has never used smokeless tobacco. He reports that he does not drink alcohol or use drugs.   Critical care time: 35 min

## 2018-07-09 NOTE — Progress Notes (Signed)
Initial Nutrition Assessment  DOCUMENTATION CODES:  Not applicable  INTERVENTION:  Initiate TF via NGT with Jevity 1.2 at goal rate of 65 ml/h (1560 ml per day) to provide 1872 kcals, 87 gm protein, 1259 ml free water daily.  To meet 100% fluids needs, +300 cc free water bolus Q8 hrs.    NUTRITION DIAGNOSIS:  Inadequate oral intake related to inability to eat as evidenced by NPO status.  GOAL:  Patient will meet greater than or equal to 90% of their needs  MONITOR:  PO intake, Diet advancement, TF tolerance, Weight trends, Labs, I & O's  REASON FOR ASSESSMENT:  Consult Enteral/tube feeding initiation and management  ASSESSMENT:  78 y/o male pmhx mental retardation, HTN. Resident of a group home. Presents to ED after falling and striking head. Developed R side weakness. CT head showed L SDH w/ mass effect and midline shift s/p emergent L sided craniotomy 3/19.   Pt extubated 3/19, but was too lethargic for swallow evaluation yesterday. Today, the pt continues to demonstrate significant lethargy, barring any chance for diet advancement. As such, RD consulted for small bore ngt placement and initiation of TF.   Pt is not able to provide any history. No one else present in room. Per chart, the patient was at his baseline prior to recent fall on 3/19. His current wt is ~171 lbs and it appears his weight had been 175-185 for the year prior to this.   Per RN, plan is for ST to reevaluate on Monday.   Labs: Unremarkable Meds: PPI, Senna, IVF  Recent Labs  Lab 07/07/18 0216 07/07/18 0253 07/07/18 0503 07/08/18 0536 07/09/18 0137  NA 136  --  138 139 141  K 4.0  --  3.6 3.7 3.8  CL 103  --   --  112* 113*  CO2 24  --   --  19* 20*  BUN 19  --   --  15 17  CREATININE 1.04 1.10  --  1.18 1.06  CALCIUM 9.4  --   --  8.4* 8.5*  MG  --   --   --  2.0  --   PHOS  --   --   --  2.4*  --   GLUCOSE 143*  --   --  132* 96   NUTRITION - FOCUSED PHYSICAL EXAM:   Most Recent Value   Orbital Region  No depletion  Upper Arm Region  No depletion  Thoracic and Lumbar Region  No depletion  Buccal Region  No depletion  Temple Region  No depletion  Clavicle Bone Region  No depletion  Clavicle and Acromion Bone Region  No depletion  Scapular Bone Region  No depletion  Dorsal Hand  No depletion  Patellar Region  No depletion  Anterior Thigh Region  No depletion  Posterior Calf Region  No depletion  Edema (RD Assessment)  None  Hair  Reviewed  Eyes  Reviewed  Mouth  Reviewed  Skin  Reviewed  Nails  Reviewed     Diet Order:   Diet Order            Diet NPO time specified  Diet effective now             EDUCATION NEEDS:  Not appropriate for education at this time  Skin:  Surgical incision-L skull  Last BM:  Unknown  Height:  Ht Readings from Last 1 Encounters:  07/07/18 5' 6.25" (1.683 m)   Weight:  Wt Readings from  Last 1 Encounters:  07/07/18 77.5 kg   Wt Readings from Last 10 Encounters:  07/07/18 77.5 kg  07/06/18 80 kg  06/21/18 80 kg  05/10/18 80.3 kg  01/12/18 80.2 kg  09/22/17 83 kg  03/24/17 85.7 kg  12/17/16 84.2 kg  12/10/16 83 kg  09/23/16 82.1 kg   Ideal Body Weight:  65.2 kg  BMI:  Body mass index is 27.37 kg/m.  Estimated Nutritional Needs:  Kcal:  1700-1950 kcals (23-25 kcal/kg bw) Protein:  85-100g Pro (~20% energy needs) Fluid:  >1.9 L (25 ml/kg bw)  Christophe Louis RD, LDN, CNSC Clinical Nutrition Available Tues-Sat via Pager: [phone] 07/09/2018 12:09 PM

## 2018-07-09 NOTE — Progress Notes (Signed)
OT Cancellation Note  Patient Details Name: MELTON FINERAN MRN: [id number] DOB: 04/18/1941   Cancelled Treatment:    Reason Eval/Treat Not Completed: Active bedrest order.    Will check back.  Jeani Hawking, OTR/L Acute Rehabilitation Services Pager [phone] Office [phone]   Jeani Hawking M 07/09/2018, 10:07 AM

## 2018-07-09 NOTE — Procedures (Signed)
Cortrak  Person Inserting Tube:  Christophe Louis A, RD Tube Type:  Cortrak - 43 inches Tube Location:  Right nare Initial Placement:  Stomach Secured by: Tape Technique Used to Measure Tube Placement:  Documented cm marking at nare/ corner of mouth Cortrak Secured At:  82 cm    Cortrak Tube Team Note:  Cortrak successfully placed. Per cortrak monitor, tube terminates at pyloric junction. No x-ray is required. RN may begin using tube.   If the tube becomes dislodged please keep the tube and contact the Cortrak team at [email or web address] (password [email or web address]) for replacement.  If after hours and replacement cannot be delayed, place a NG tube and confirm placement with an abdominal x-ray.   Christophe Louis RD, LDN, CNSC Clinical Nutrition Available Tues-Sat via Pager: [phone] 07/09/2018 12:14 PM

## 2018-07-09 NOTE — Progress Notes (Signed)
PT Cancellation Note  Patient Details Name: Michael Mercado MRN: [id number] DOB: [birth year]   Cancelled Treatment:    Reason Eval/Treat Not Completed: Patient not medically ready;Active bedrest order   Fabio Asa 07/09/2018, 7:15 AM Charlotte Crumb, PT DPT  Board Certified Neurologic Specialist Acute Rehabilitation Services Pager [phone] Office [phone]

## 2018-07-09 NOTE — Progress Notes (Signed)
Patient ID: Michael Mercado, male   DOB: Mar 18, 1941, 78 y.o.   MRN: [id number] Vital signs are stable Motor function is stable Will place feeding tube as he cannot swallow or be tested for swallow as level of consciousness is poor Plan follow-up CT on Monday

## 2018-07-09 NOTE — Evaluation (Signed)
Clinical/Bedside Swallow Evaluation Patient Details  Name: Michael Mercado MRN: [id number] Date of Birth: 1941/02/21  Today's Date: 07/09/2018 Time: SLP Start Time (ACUTE ONLY): 0920 SLP Stop Time (ACUTE ONLY): 0935 SLP Time Calculation (min) (ACUTE ONLY): 15 min  Past Medical History:  Past Medical History:  Diagnosis Date  . Hypercholesteremia   . Hypertension   . Moderate intellectual disability   . Mood disorder (HCC)   . MR (mental retardation)   . Obesity    Past Surgical History:  Past Surgical History:  Procedure Laterality Date  . CRANIOTOMY Left 07/07/2018   Procedure: CRANIOTOMY HEMATOMA EVACUATION SUBDURAL;  Surgeon: Tia Alert, MD;  Location[facility];  Service: Neurosurgery;  Laterality: Left;   HPI:  Patient presents to the emergency room for evaluation of a fall.  Patient has a history of MR and resides in a group home.  Patient slipped when he was getting out of the shower this evening.  He struck his head.  The facility sent him to the ED for evaluation.  Patient denies any loss of consciousness.  He denies any headache.  He denies any neck pain.  No pain in his extremities.  No chest pain or shortness of breath.  No numbness or weakness.   Assessment / Plan / Recommendation Clinical Impression   Pt administered extensive oral care with max verbal/tactile cues for following simple oral directives without success (ie: "open mouth", "stick out tongue", etc.); pt unable to cough, swallow and/or vocalize volitionally with max multi-modal cues; lethargic throughout attempted BSE with no consistencies assessed d/t severe risk for aspiration at present time; recommend NPO status with ST f/u for PO readiness while in acute setting; consider non-oral nutrition for hydration/nutritive purposes; thank you for this consult. SLP Visit Diagnosis: Dysphagia, unspecified (R13.10)    Aspiration Risk  Severe aspiration risk;Risk for inadequate nutrition/hydration    Diet  Recommendation   NPO  Medication Administration: Via alternative means    Other  Recommendations Oral Care Recommendations: Oral care QID   Follow up Recommendations 24 hour supervision/assistance;Other (comment)(group home if able)      Frequency and Duration min 2x/week  1 week       Prognosis Prognosis for Safe Diet Advancement: Fair Barriers to Reach Goals: Cognitive deficits      Swallow Study   General Date of Onset: 07/06/18 HPI: Patient presents to the emergency room for evaluation of a fall.  Patient has a history of MR and resides in a group home.  Patient slipped when he was getting out of the shower this evening.  He struck his head.  The facility sent him to the ED for evaluation.  Patient denies any loss of consciousness.  He denies any headache.  He denies any neck pain.  No pain in his extremities.  No chest pain or shortness of breath.  No numbness or weakness. Type of Study: Bedside Swallow Evaluation Previous Swallow Assessment: n/a Diet Prior to this Study: NPO Temperature Spikes Noted: Yes Respiratory Status: Room air History of Recent Intubation: Yes Length of Intubations (days): (surgery only) Date extubated: (07/07/18) Behavior/Cognition: Confused;Lethargic/Drowsy Oral Cavity Assessment: Dry Oral Care Completed by SLP: Yes Oral Cavity - Dentition: Missing dentition Patient Positioning: Upright in bed Baseline Vocal Quality: Not observed Volitional Cough: Cognitively unable to elicit Volitional Swallow: Unable to elicit    Oral/Motor/Sensory Function Overall Oral Motor/Sensory Function: Other (comment)(UTA)   Ice Chips Ice chips: Not tested   Thin Liquid Thin Liquid: Not tested  Nectar Thick Nectar Thick Liquid: Not tested   Honey Thick Honey Thick Liquid: Not tested   Puree Puree: Not tested   Solid     Solid: Not tested      Tressie Stalker, M.S., CCC-SLP 07/09/2018,1:09 PM

## 2018-07-10 LAB — GLUCOSE, CAPILLARY
GLUCOSE-CAPILLARY: 134 mg/dL — AB (ref 70–99)
Glucose-Capillary: 127 mg/dL — ABNORMAL HIGH (ref 70–99)
Glucose-Capillary: 108 mg/dL — ABNORMAL HIGH (ref 70–99)
Glucose-Capillary: 136 mg/dL — ABNORMAL HIGH (ref 70–99)
Glucose-Capillary: 124 mg/dL — ABNORMAL HIGH (ref 70–99)
Glucose-Capillary: 117 mg/dL — ABNORMAL HIGH (ref 70–99)

## 2018-07-10 NOTE — Progress Notes (Signed)
SLP Cancellation Note  Patient Details Name: ESTES STREET MRN: [id number] DOB: Feb 14, 1941   Cancelled treatment:       Reason Eval/Treat Not Completed: Patient at procedure or test/unavailable. Pt being cleaned by RN at this time. SLP will follow up as able.   Jermel Artley I. Vear Clock, MS, CCC-SLP Acute Rehabilitation Services Office number [phone] Pager [phone]  [name] 07/10/2018, 9:02 AM

## 2018-07-10 NOTE — Progress Notes (Signed)
PULMONARY / CRITICAL CARE MEDICINE   NAME:  Michael Mercado, MRN:  [id number], DOB:  Sep 28, 1940, LOS: 3 ADMISSION DATE:  07/07/2018, CONSULTATION DATE:  07/07/2018 REFERRING MD:  Yetta Barre, CHIEF COMPLAINT:  Subdural hematoma  BRIEF HISTORY:    This is a 7 yoM w/MR from group home s/p fall x 2 found to have large L SDH with 19 mm shift, intubated for airway protection and taken for emergent crani.Initially required post op vent for a few hours, now extubated. Has done well since extubated with no resp. Compromise. HISTORY OF PRESENT ILLNESS   78 year old male with history of moderate intellectual disablity, tobacco abuse, COPD, HTN, HLD, CKD stage II who presented to [facility] 3/18 after fall. Apparently slipped and fell getting out of shower. Denied LOC. He was evaluated, asymptomatic, and sent home only to come back early hours of 3/19 after additional fall from bed around 0115. LSW ~2200 on 3/18. Presented unresponsive with right facial droop and hypertensive. Code stroke activated. CT head noted large subdural hematoma 19 mm of left-to-right shift. He was intubated for airway protection and transferred emergently to [facility] for Neurosurgery evaluation. He was taken to the OR for left craniotomy. EBL ~100. He returns to the ICU postoperatively intubated and therefore PCCM was consulted for vent management. SIGNIFICANT PAST MEDICAL HISTORY   moderate intellectual disablity, tobacco abuse, COPD, HTN, HLD, CKD stage II,  CONSULTANTS:  Neurosurg SUBJECTIVE:  Sleeping in no apparent distress  CONSTITUTIONAL: BP (!) 141/72   Pulse 69   Temp (!) 100.7 F (38.2 C) (Axillary)   Resp 15   Ht 5' 6.25" (1.683 m)   Wt 73.4 kg   SpO2 96%   BMI 25.92 kg/m   I/O last 3 completed shifts: In: 2127.9 [I.V.:1760.3; NG/GT:267.6; IV Piggyback:100] Out: 2355 [Urine:2300; Drains:55]        PHYSICAL EXAM: General:  WD/WN NAD HEENT:  Mouth breathing Cardiovascular:  RRR, no m/r/g Lungs:   Scattered rhonchi Abdomen:  Supple, no guarding Musculoskeletal:  No active joints Skin:  No c/c/e  RESOLVED PROBLEM LIST   ASSESSMENT AND PLAN   The patient has done well post extubation. Continue Duonebs until patient able to resume dual bronchodilator DPI at time of discharge. PCCM will sign off but please contact us if condition changes.  SUMMARY OF TODAY'S PLAN:  As above  LABS  Glucose Recent Labs  Lab 07/09/18 1621 07/09/18 1957 07/09/18 2333 07/10/18 0334 07/10/18 0820  GLUCAP 123* 132* 123* 127* 134*    BMET Recent Labs  Lab 07/07/18 0216 07/07/18 0253 07/07/18 0503 07/08/18 0536 07/09/18 0137  NA 136  --  138 139 141  K 4.0  --  3.6 3.7 3.8  CL 103  --   --  112* 113*  CO2 24  --   --  19* 20*  BUN 19  --   --  15 17  CREATININE 1.04 1.10  --  1.18 1.06  GLUCOSE 143*  --   --  132* 96    Liver Enzymes Recent Labs  Lab 07/07/18 0216  AST 23  ALT 16  ALKPHOS 94  BILITOT 0.5  ALBUMIN 4.3    Electrolytes Recent Labs  Lab 07/07/18 0216 07/08/18 0536 07/09/18 0137  CALCIUM 9.4 8.4* 8.5*  MG  --  2.0  --   PHOS  --  2.4*  --     CBC Recent Labs  Lab 07/07/18 0216 07/07/18 0503 07/08/18 0536 07/09/18 0137  WBC 11.5*  --  11.7* 10.3  HGB 14.1 10.9* 10.5* 10.5*  HCT 43.0 32.0* 32.5* 32.1*  PLT 237  --  200 194    ABG Recent Labs  Lab 07/07/18 0503  PHART 7.337*  PCO2ART 44.2  PO2ART 436.0*    Coag's Recent Labs  Lab 07/07/18 0216  APTT 29  INR 1.0    Sepsis Markers No results for input(s): LATICACIDVEN, PROCALCITON, O2SATVEN in the last 168 hours.  Cardiac Enzymes No results for input(s): TROPONINI, PROBNP in the last 168 hours.  PAST MEDICAL HISTORY :   He  has a past medical history of Hypercholesteremia, Hypertension, Moderate intellectual disability, Mood disorder (HCC), MR (mental retardation), and Obesity.  PAST SURGICAL HISTORY:  He  has a past surgical history that includes Craniotomy (Left,  07/07/2018).  No Known Allergies  No current facility-administered medications on file prior to encounter.    Current Outpatient Medications on File Prior to Encounter  Medication Sig  . amLODipine (NORVASC) 10 MG tablet Take 1 tablet (10 mg total) by mouth daily.  [name] aspirin 81 MG EC tablet TAKE 1 TABLET BY MOUTH ONCE DAILY. **DO NOT CRUSH** (Patient taking differently: Take 81 mg by mouth daily. )  . BREO ELLIPTA 100-25 MCG/INH AEPB INHALE 1 PUFF ONCE DAILY. (Patient taking differently: Inhale 1 puff into the lungs daily. )  . clonazePAM (KLONOPIN) 0.5 MG tablet Take 0.5 mg by mouth daily as needed for anxiety.   . COMPLETE ALLERGY MEDICINE 25 MG capsule TAKE 1 CAPSULE BY MOUTH DAILY AS NEEDED FOR ALLERGY SYMPTOMS. CONTACTNURSE IF SYMPTOMS WORSEN. (Patient taking differently: Take 25 mg by mouth daily as needed for allergies. )  . Emollient (MINERIN) LOTN APPLY TWICE DAILY  . hydrocortisone cream 1 % APPLY TO THE AFFECTED AREA(s) THREE TIMES DAILY. (Patient taking differently: Apply 1 application topically 3 (three) times daily. )  . loperamide (IMODIUM) 2 MG capsule TAKE 2 CAPSULES BY MOUTH AS NEEDED AFTER 2ND LOOSE STOOL REPEAT AFTERNEXT LOOSE STOOL MAX 3 DOSES, NOTIFY NURSE. (Patient taking differently: Take 4 mg by mouth as needed for diarrhea or loose stools. )  . MAPAP 500 MG tablet TAKE 2 TABS BY MOUTH EVERY 4 HRS AS NEEDED FOR HEADACHE/MILD TO MODERATE PAIN OR TEMP OF 100.3 & ABOVE IF TEMP UNRESOLVED AFTER 24 HRS MUST (Patient taking differently: Take 1,000 mg by mouth every 4 (four) hours as needed for moderate pain or headache. )  . Neomycin-Bacitracin-Polymyxin (TRIPLE ANTIBIOTIC) [id number] OINT MINOR CUT/ABRASION:CLEAN W/ MILD SOAP/WATER THEN APPLY THIN LAYER TO AFFECTED AREA MAY COVER W/ BANDAID REMOVE AFTER 24 HRS NOTIFY NURSE OF (Patient taking differently: Apply 1 application topically as needed (cuts or abrasion). )  . PARoxetine (PAXIL) 20 MG tablet Take 20 mg by mouth at  bedtime.   . Polyethylene Glycol (LIP BALM BASE EX) Apply topically.  . QC ANTACID/ANTI-GAS 027-253-66 MG/5ML suspension TAKE AS NEEDED FOR UPSET STOMACH. NO MORE THAN 3 DOSES IN 24 HOURS. NOTIFY NURSE IF SYMPTOMS PERSIST. (Patient taking differently: Take 10 mLs by mouth as needed for indigestion. )  . QC MILK OF MAGNESIA 400 MG/5ML suspension TAKE (2 TBLS)AS NEEDED AFTER 3 DAYS NO BM MAY REPEAT X3 DOSES IFNO RESOLUTION CALL NURSE (CONSTIPATION) (Patient taking differently: Take 30 mLs by mouth as needed for mild constipation. )  . risperiDONE (RISPERDAL) 1 MG tablet Take 1 mg by mouth 2 (two) times daily.  . Sodium Bicarbonate (AFTER BITE EX) Apply topically as needed.  . Sunscreens (COPPERTONE SPORT SPF30 EX) Apply  topically.  . TUSSIN 100 MG/5ML syrup TAKE (2TSP) BY MOUTH EVERY 6 HOURS AS NEEDED FOR COUGH/COLD MAX4 DOSES IN 24 HRS, NOTIFY NURSE IF PERSISTS. (Patient taking differently: Take 200 mg by mouth every 6 (six) hours as needed for cough. )  . VENTOLIN HFA 108 (90 Base) MCG/ACT inhaler INHALE (2) PUFFS BY MOUTH 4 TIMES DAILY AS NEEDED FOR SHORTNESS OF BREATH. (Patient taking differently: Inhale 2 puffs into the lungs every 4 (four) hours as needed for wheezing or shortness of breath. )  . Emollient (LUBRICATING LOTION EX) Apply topically 2 (two) times daily.  [name] ibuprofen (ADVIL,MOTRIN) 400 MG tablet Take 400 mg by mouth every 8 (eight) hours as needed.  . loperamide (IMODIUM) 2 MG capsule Take by mouth as needed for diarrhea or loose stools.  [name] loperamide (IMODIUM) 2 MG capsule TAKE 2 CAPSULES BY MOUTH AS NEEDED AFTER 2ND LOOSE STOOL. TAKE 1 CAPSULE AFTER NEXT LOOSE STOOL MAX 3 DOSES, NOTIFY NURSE. (Patient not taking: Reported on 07/07/2018)    FAMILY HISTORY:   His family history includes Stroke in his sister.  SOCIAL HISTORY:  He  reports that he has been smoking cigarettes. He has been smoking about 0.33 packs per day. He has never used smokeless tobacco. He reports  that he does not drink alcohol or use drugs.   Critical care time: 30 min

## 2018-07-10 NOTE — Evaluation (Signed)
Physical Therapy Evaluation Patient Details Name: Michael Mercado MRN: [id number] DOB: [birth year] Today's Date: 07/10/2018   History of Present Illness  78 year old Caucasian male with intellectual disability, anxiety and schizophrenia presented with AMS/unresponsive and R sided neurologic deficits.  Found to have a large L SDH with midline shift. s/p L crani for evacuation of subdural hematoma  Clinical Impression   Patient is s/p above surgery resulting in functional limitations due to the deficits listed below (see PT Problem List). Admitted after fall in the shower from his group home; While exact prior level of function is not entirely certain, it is likely that he was ambulatory and managing basic ADLs; Presents with decr functional mobility, decr activity tolerance, decr arousal, tonal fluctuations, RUE hemiparesis, generalized weakness, decr cognition;  Patient will benefit from skilled PT to increase their independence and safety with mobility to allow discharge to the venue listed below.       Follow Up Recommendations CIR    Equipment Recommendations  Other (comment)(to be determined)    Recommendations for Other Services       Precautions / Restrictions Precautions Precautions: Fall Precaution Comments: Drains from Crani      Mobility  Bed Mobility Overal bed mobility: Needs Assistance Bed Mobility: Supine to Sit     Supine to sit: Total assist;+2 for physical assistance     General bed mobility comments: max to Total assist to come sit to EOB  Transfers Overall transfer level: Needs assistance Equipment used: 2 person hand held assist(and bed pad cradling hips) Transfers: Sit to/from Stand;Stand Pivot Transfers Sit to Stand: +2 physical assistance;Mod assist Stand pivot transfers: +2 physical assistance;Mod assist       General transfer comment: heavy mod assist to power up to stand, knees blocked for safety; noted LE weakness, but no overt knee  buckling, and appropriate recruitment of LE musculature to stabilize with incr time in standing and with weight shifts/steps for transfer to recliner  Ambulation/Gait                Stairs            Wheelchair Mobility    Modified Rankin (Stroke Patients Only) Modified Rankin (Stroke Patients Only) Pre-Morbid Rankin Score: No significant disability Modified Rankin: Severe disability     Balance Overall balance assessment: Needs assistance Sitting-balance support: Feet supported;Single extremity supported Sitting balance-Leahy Scale: Fair       Standing balance-Leahy Scale: Zero                               Pertinent Vitals/Pain Pain Assessment: Faces Faces Pain Scale: No hurt Pain Intervention(s): Monitored during session    Home Living Family/patient expects to be discharged to:: Group home                 Additional Comments: Not a lot of infomation about group home available at time of writing this note    Prior Function           Comments: Not a lot of information available re: PLOF at time of writing this note, except that normally Daral is social and talkative; It is likely he was walking independently, and managing his basic ADLs in the group home setting     Hand Dominance        Extremity/Trunk Assessment   Upper Extremity Assessment Upper Extremity Assessment: Defer to OT evaluation(no active movement noted R UE)  Lower Extremity Assessment Lower Extremity Assessment: Generalized weakness;RLE deficits/detail;LLE deficits/detail RLE Deficits / Details: Slow to move RLE when asked to, but he did participate in lifting RLE for sock donning; Hip, knee, and ankle ROM WFL for dunctional mobiltiy tasks performed today; Noted adductor stiffness bilaterally, seemingly R greater than L; noted bil LE muscle recruitment in weight bearing response for standing when center of mass was shifted over feet LLE Deficits / Details:  participated in lifting LLE for sock donning; Hip, knee, and ankle ROM WFL for dunctional mobiltiy tasks performed today; Noted adductor stiffness bilaterally, seemingly R greater than L; noted bil LE muscle recruitment in weight bearing response for standing when center of mass was shifted over feet       Communication   Communication: Receptive difficulties;Expressive difficulties  Cognition Arousal/Alertness: Lethargic(Eyes opened approx 40% of session) Behavior During Therapy: WFL for tasks assessed/performed Overall Cognitive Status: Difficult to assess Area of Impairment: Attention;Following commands;Safety/judgement;Awareness;Problem solving                       Following Commands: Follows one step commands inconsistently;Follows one step commands with increased time Safety/Judgement: Decreased awareness of safety;Decreased awareness of deficits   Problem Solving: Decreased initiation General Comments: Meril did not answer questions this session; he did respond actively to help raise LEs one at a time with sock donning, and offered his hand to replace L glove restraint      General Comments General comments (skin integrity, edema, etc.): Session conducted on Room Air, VSS    Exercises     Assessment/Plan    PT Assessment Patient needs continued PT services  PT Problem List Decreased strength;Decreased range of motion;Decreased activity tolerance;Decreased balance;Decreased mobility;Decreased coordination;Decreased cognition;Decreased knowledge of use of DME;Decreased safety awareness;Decreased knowledge of precautions;Impaired sensation;Impaired tone       PT Treatment Interventions DME instruction;Gait training;Functional mobility training;Therapeutic activities;Therapeutic exercise;Balance training;Neuromuscular re-education;Cognitive remediation;Patient/family education    PT Goals (Current goals can be found in the Care Plan section)  Acute Rehab PT  Goals Patient Stated Goal: Unable to state PT Goal Formulation: Patient unable to participate in goal setting Time For Goal Achievement: 07/24/18 Potential to Achieve Goals: Fair    Frequency Min 4X/week   Barriers to discharge        Co-evaluation PT/OT/SLP Co-Evaluation/Treatment: Yes Reason for Co-Treatment: Complexity of the patient's impairments (multi-system involvement);Necessary to address cognition/behavior during functional activity;For patient/therapist safety PT goals addressed during session: Mobility/safety with mobility         AM-PAC PT "6 Clicks" Mobility  Outcome Measure Help needed turning from your back to your side while in a flat bed without using bedrails?: A Lot Help needed moving from lying on your back to sitting on the side of a flat bed without using bedrails?: Total Help needed moving to and from a bed to a chair (including a wheelchair)?: Total Help needed standing up from a chair using your arms (e.g., wheelchair or bedside chair)?: A Lot Help needed to walk in hospital room?: A Lot Help needed climbing 3-5 steps with a railing? : Total 6 Click Score: 9    End of Session Equipment Utilized During Treatment: Gait belt;Other (comment)(bed pad to cradle hips) Activity Tolerance: Patient tolerated treatment well Patient left: in chair;with call bell/phone within reach;with chair alarm set Nurse Communication: Mobility status;Need for lift equipment PT Visit Diagnosis: Unsteadiness on feet (R26.81);Other abnormalities of gait and mobility (R26.89);Other symptoms and signs involving the nervous system (R29.898)  Time: 1610-9604 PT Time Calculation (min) (ACUTE ONLY): 43 min   Charges:   PT Evaluation $PT Eval Moderate Complexity: 1 Mod PT Treatments $Therapeutic Activity: 8-22 mins        Van Clines, PT  Acute Rehabilitation Services Pager [phone] Office [phone]   Levi Aland 07/10/2018, 2:37 PM

## 2018-07-10 NOTE — Progress Notes (Signed)
  Speech Language Pathology Treatment: Dysphagia  Patient Details Name: Michael Mercado MRN: [id number] DOB: Dec 09, 1940 Today's Date: 07/10/2018 Time: 7026-3785 SLP Time Calculation (min) (ACUTE ONLY): 15 min  Assessment / Plan / Recommendation Clinical Impression  Michael Mercado was seen for dysphagia treatment to assess improvement in swallow function. His level of alertness appears improved compared to that which was documented yesterday. Michael Mercado tolerated three of four 1/2 tsp boluses of thin liquids without overt s/sx of aspiration but exhibited coughing with a single bolus. Labial stripping was absent with all boluses. Michael Mercado's awareness of ice chips and puree boluses was reduced and no bolus manipulation or swallowing was noted with either of these consistencies. These boluses were ultimately removed from the Michael Mercado's buccal cavity via oral suctioning. Michael Mercado has demonstrated some improvement in swallow function but a p.o. diet is not clinically indicated as yet. It is recommended that the Michael Mercado's NPO status be continued with non-oral nutrition via Cortrak which is currently in place. SLP will continue to follow Michael Mercado to assess continued improvement in swallow function, safety of a p.o. diet, and the need for instrumental assessment.    HPI HPI: Michael Mercado is a 78 year old male with history of moderate intellectual disablity, tobacco abuse, COPD,  HTN, HLD, CKD stage II who presented to [facility] 07/06/18 after fall.  Apparently Michael Mercado slipped and fell getting out of shower but denied LOC.  Michael Mercado was evaluated, asymptomatic, and sent home but then returned on 07/07/18 after additional fall from bed around 0115. At that time Michael Mercado was unresponsive with right facial droop and hypertensive. Code stroke activated. CT head showed large subdural hematoma 19 mm of left-to-right shift.  Michael Mercado was intubated for airway protection and transferred emergently to [facility] for Neurosurgery evaluation. Left craniotomy was completed on 07/07/18 for  exacuation and Michael Mercado was extubated following surgery.       SLP Plan  Continue with current plan of care       Recommendations  Diet recommendations: NPO Medication Administration: Via alternative means                Oral Care Recommendations: Oral care QID Follow up Recommendations: 24 hour supervision/assistance SLP Visit Diagnosis: Dysphagia, oropharyngeal phase (R13.12) Plan: Continue with current plan of care       Elinda Bunten I. Vear Clock, MS, CCC-SLP Acute Rehabilitation Services Office number [phone] Pager [phone]               [name] 07/10/2018, 5:28 PM

## 2018-07-10 NOTE — Progress Notes (Signed)
Patient ID: Michael Mercado, male   DOB: [birth year], 78 y.o.   MRN: [id number] Opens eyes but nonverbal trace movement of extremities level of consciousness remains poor CT ordered for the a.m. Drainage is minimal

## 2018-07-10 NOTE — Progress Notes (Signed)
Spoke with [name] from [facility] over phone. (She provided password). She said Michael Mercado is usually very talkative.

## 2018-07-10 NOTE — Progress Notes (Signed)
Rehab Admissions Coordinator Note:  Patient was screened by Trish Mage for appropriateness for an Inpatient Acute Rehab Consult.  At this time, we are recommending Inpatient Rehab consult.  Trish Mage 07/10/2018, 6:40 PM  I can be reached at [phone].

## 2018-07-10 NOTE — Evaluation (Signed)
Occupational Therapy Evaluation Patient Details Name: Michael Mercado MRN: [id number] DOB: [birth year] Today's Date: 07/10/2018    History of Present Illness 78 year old Caucasian male with intellectual disability, anxiety and schizophrenia presented with AMS/unresponsive and R sided neurologic deficits.  Found to have a large L SDH with midline shift. s/p L crani for evacuation of subdural hematoma   Clinical Impression   Pt admitted with above. He demonstrates the below listed deficits and will benefit from continued OT to maximize safety and independence with BADLs.  Pt presents to OT with Rt hemiparesis, Lt gaze preference,  Impaired balance, impaired cognition - he demonstrates focused attention, and follows one step motor commands inconsistently.  He requires total A for all aspects of ADLs.  PTA, he lived in a group home - unsure level of assist he required.  Recommend CIR to reduce burden of care, and maximize independence with ADLs.       Follow Up Recommendations  CIR    Equipment Recommendations  None recommended by OT    Recommendations for Other Services Rehab consult     Precautions / Restrictions Precautions Precautions: Fall Precaution Comments: Drains from Crani      Mobility Bed Mobility Overal bed mobility: Needs Assistance Bed Mobility: Supine to Sit     Supine to sit: Total assist;+2 for physical assistance     General bed mobility comments: max to Total assist to come sit to EOB  Transfers Overall transfer level: Needs assistance Equipment used: 2 person hand held assist(and bed pad cradling hips) Transfers: Sit to/from Stand;Stand Pivot Transfers Sit to Stand: +2 physical assistance;Mod assist Stand pivot transfers: +2 physical assistance;Mod assist       General transfer comment: heavy mod assist to power up to stand, knees blocked for safety; noted LE weakness, but no overt knee buckling, and appropriate recruitment of LE musculature to  stabilize with incr time in standing and with weight shifts/steps for transfer to recliner    Balance Overall balance assessment: Needs assistance Sitting-balance support: Feet supported;Single extremity supported Sitting balance-Leahy Scale: Fair       Standing balance-Leahy Scale: Zero                             ADL either performed or assessed with clinical judgement   ADL Overall ADL's : Needs assistance/impaired Eating/Feeding: NPO   Grooming: Wash/dry hands;Wash/dry face;Total assistance;Sitting   Upper Body Bathing: Total assistance;Sitting   Lower Body Bathing: Total assistance;Sit to/from stand   Upper Body Dressing : Total assistance;Sitting   Lower Body Dressing: Total assistance;Sit to/from stand   Toilet Transfer: Maximal assistance;+2 for physical assistance;+2 for safety/equipment;Stand-pivot;BSC   Toileting- Clothing Manipulation and Hygiene: Total assistance;Sit to/from stand       Functional mobility during ADLs: Maximal assistance;Moderate assistance;+2 for physical assistance;+2 for safety/equipment General ADL Comments: Pt unable to assist with ADLs, but he did spontaneously advance LEs during transfers      Vision Baseline Vision/History: Wears glasses Wears Glasses: At all times Additional Comments: Pt unable to provide info, and unable to follow commands for testing.  Eyes are very dysconjugate with Lt eye fully abducted and superior - unsure if this is baseline, or new     Perception Perception Perception Tested?: Yes Perception Deficits: Inattention/neglect Inattention/Neglect: Does not attend to right visual field;Does not attend to right side of body Spatial deficits: Lt gaze preference and appears to neglect Lt body    Praxis  Praxis Praxis tested?: Deficits Deficits: Ideomotor    Pertinent Vitals/Pain Pain Assessment: Faces Faces Pain Scale: No hurt Pain Intervention(s): Monitored during session     Hand Dominance  (unsure )   Extremity/Trunk Assessment Upper Extremity Assessment Upper Extremity Assessment: RUE deficits/detail RUE Deficits / Details: no spontaneous movement noted.   RUE Coordination: decreased fine motor;decreased gross motor   Lower Extremity Assessment Lower Extremity Assessment: Defer to PT evaluation RLE Deficits / Details: Slow to move RLE when asked to, but he did participate in lifting RLE for sock donning; Hip, knee, and ankle ROM WFL for dunctional mobiltiy tasks performed today; Noted adductor stiffness bilaterally, seemingly R greater than L; noted bil LE muscle recruitment in weight bearing response for standing when center of mass was shifted over feet LLE Deficits / Details: participated in lifting LLE for sock donning; Hip, knee, and ankle ROM WFL for dunctional mobiltiy tasks performed today; Noted adductor stiffness bilaterally, seemingly R greater than L; noted bil LE muscle recruitment in weight bearing response for standing when center of mass was shifted over feet   Cervical / Trunk Assessment Cervical / Trunk Assessment: Other exceptions Cervical / Trunk Exceptions: decreased isolated movement of trunk    Communication Communication Communication: Receptive difficulties;Expressive difficulties   Cognition Arousal/Alertness: Lethargic(Eyes opened approx 40% of session) Behavior During Therapy: WFL for tasks assessed/performed Overall Cognitive Status: Difficult to assess Area of Impairment: Attention;Following commands;Safety/judgement;Awareness;Problem solving                   Current Attention Level: Focused   Following Commands: Follows one step commands inconsistently;Follows one step commands with increased time Safety/Judgement: Decreased awareness of deficits;Decreased awareness of safety   Problem Solving: Decreased initiation;Slow processing General Comments: Michael Mercado did not answer questions this session; he did respond actively to help raise  LEs one at a time with sock donning, and offered his hand to replace L glove restraint   General Comments  VSS     Exercises     Shoulder Instructions      Home Living Family/patient expects to be discharged to:: Group home                                 Additional Comments: Not a lot of infomation about group home available at time of writing this note      Prior Functioning/Environment          Comments: Not a lot of information available re: PLOF at time of writing this note, except that normally Lawyer is social and talkative; It is likely he was walking independently, and managing his basic ADLs in the group home setting        OT Problem List: Decreased range of motion;Decreased strength;Decreased activity tolerance;Impaired balance (sitting and/or standing);Impaired vision/perception;Decreased coordination;Decreased cognition;Decreased safety awareness;Decreased knowledge of use of DME or AE;Cardiopulmonary status limiting activity;Impaired UE functional use      OT Treatment/Interventions: Self-care/ADL training;Neuromuscular education;DME and/or AE instruction;Therapeutic activities;Cognitive remediation/compensation;Visual/perceptual remediation/compensation;Patient/family education;Balance training    OT Goals(Current goals can be found in the care plan section) Acute Rehab OT Goals Patient Stated Goal: Unable to state OT Goal Formulation: Patient unable to participate in goal setting Time For Goal Achievement: 07/24/18 Potential to Achieve Goals: Good ADL Goals Pt Will Perform Grooming: with mod assist;sitting Pt Will Perform Upper Body Bathing: with mod assist;sitting Pt Will Transfer to Toilet: with mod assist;bedside commode;stand pivot transfer Pt Will  Perform Toileting - Clothing Manipulation and hygiene: with max assist;sit to/from stand Additional ADL Goal #1: Pt will locate needed ADL items with min cues  OT Frequency: Min 2X/week    Barriers to D/C: Decreased caregiver support  unsure if group home able to provide current level of care        Co-evaluation PT/OT/SLP Co-Evaluation/Treatment: Yes Reason for Co-Treatment: Complexity of the patient's impairments (multi-system involvement);Necessary to address cognition/behavior during functional activity;For patient/therapist safety;To address functional/ADL transfers PT goals addressed during session: Mobility/safety with mobility OT goals addressed during session: ADL's and self-care;Strengthening/ROM      AM-PAC OT "6 Clicks" Daily Activity     Outcome Measure Help from another person eating meals?: Total Help from another person taking care of personal grooming?: Total Help from another person toileting, which includes using toliet, bedpan, or urinal?: A Lot Help from another person bathing (including washing, rinsing, drying)?: Total Help from another person to put on and taking off regular upper body clothing?: Total Help from another person to put on and taking off regular lower body clothing?: Total 6 Click Score: 7   End of Session Equipment Utilized During Treatment: Gait belt  Activity Tolerance: Patient tolerated treatment well Patient left: in chair;with call bell/phone within reach;with chair alarm set  OT Visit Diagnosis: Unsteadiness on feet (R26.81);Muscle weakness (generalized) (M62.81);Feeding difficulties (R63.3);Cognitive communication deficit (R41.841);Hemiplegia and hemiparesis Symptoms and signs involving cognitive functions: Cerebral infarction Hemiplegia - Right/Left: Right Hemiplegia - dominant/non-dominant: Dominant Hemiplegia - caused by: Cerebral infarction                Time: 1610-9604 OT Time Calculation (min): 41 min Charges:  OT General Charges $OT Visit: 1 Visit OT Evaluation $OT Eval Moderate Complexity: 1 Mod  Jeani Hawking, OTR/L Acute Rehabilitation Services Pager [phone] Office [phone]   Jeani Hawking M 07/10/2018, 3:00 PM

## 2018-07-10 NOTE — Progress Notes (Signed)
PT Cancellation Note  Patient Details Name: Michael Mercado MRN: [id number] DOB: 02-03-1941   Cancelled Treatment:    Reason Eval/Treat Not Completed: Active bedrest order   Fabio Asa 07/10/2018, 6:42 AM

## 2018-07-11 ENCOUNTER — Inpatient Hospital Stay (HOSPITAL_COMMUNITY): Payer: Medicare Other

## 2018-07-11 DIAGNOSIS — S069X3S Unspecified intracranial injury with loss of consciousness of 1 hour to 5 hours 59 minutes, sequela: Secondary | ICD-10-CM

## 2018-07-11 LAB — GLUCOSE, CAPILLARY
GLUCOSE-CAPILLARY: 129 mg/dL — AB (ref 70–99)
GLUCOSE-CAPILLARY: 121 mg/dL — AB (ref 70–99)
Glucose-Capillary: 108 mg/dL — ABNORMAL HIGH (ref 70–99)
Glucose-Capillary: 115 mg/dL — ABNORMAL HIGH (ref 70–99)
Glucose-Capillary: 114 mg/dL — ABNORMAL HIGH (ref 70–99)

## 2018-07-11 NOTE — Consult Note (Signed)
Physical Medicine and Rehabilitation Consult Reason for Consult: Right side weakness Referring Physician: Dr. Marikay Alar   HPI: Michael Mercado is a 78 y.o.right handed male with history of hypertension, hyperlipidemia, mild mental retardation with anxiety/schizophrenia maintained on Klonopin 0.5 mg daily as needed, Paxil 20 mg daily at bedtime  as well as tobacco abuse. Per report patient lives at [facility]  group home. He was able to manage basic ADLs. Presented 07/07/2018 after a fall getting out of the shower. Per report patient did strike his head but did not lose consciousness. Cranial CT scan showed a large mixed attenuation left whole low hemispheric subdural hematoma measuring 2.8 cm in maximal diameter with associated extensive mass effect and left-to-right midline shift. Underwent left craniotomy for evacuation of subdural hematoma 07/07/2018 per Dr. Marikay Alar. Patient was successfully extubated 07/07/2018. Maintained on Keppra for seizure prophylaxis. Patient is currently NPO with alternative means of nutritional support. Follow-up cranial CT scan showed midline shift improved to 1 cm. Therapy evaluation completed with recommendations of physical medicine rehabilitation consult.   Review of Systems  Unable to perform ROS: Acuity of condition   Past Medical History:  Diagnosis Date   Hypercholesteremia    Hypertension    Moderate intellectual disability    Mood disorder (HCC)    MR (mental retardation)    Obesity    Past Surgical History:  Procedure Laterality Date   CRANIOTOMY Left 07/07/2018   Procedure: CRANIOTOMY HEMATOMA EVACUATION SUBDURAL;  Surgeon: Tia Alert, MD;  Location: [facility] OR;  Service: Neurosurgery;  Laterality: Left;   Family History  Problem Relation Age of Onset   Stroke Sister    Social History:  reports that he has been smoking cigarettes. He has been smoking about 0.33 packs per day. He has never used smokeless tobacco. He reports  that he does not drink alcohol or use drugs. Allergies: No Known Allergies Medications Prior to Admission  Medication Sig Dispense Refill   amLODipine (NORVASC) 10 MG tablet Take 1 tablet (10 mg total) by mouth daily. 90 tablet 3   aspirin 81 MG EC tablet TAKE 1 TABLET BY MOUTH ONCE DAILY. **DO NOT CRUSH** (Patient taking differently: Take 81 mg by mouth daily. ) 30 tablet 11   BREO ELLIPTA 100-25 MCG/INH AEPB INHALE 1 PUFF ONCE DAILY. (Patient taking differently: Inhale 1 puff into the lungs daily. ) 60 each 5   clonazePAM (KLONOPIN) 0.5 MG tablet Take 0.5 mg by mouth daily as needed for anxiety.      COMPLETE ALLERGY MEDICINE 25 MG capsule TAKE 1 CAPSULE BY MOUTH DAILY AS NEEDED FOR ALLERGY SYMPTOMS. CONTACTNURSE IF SYMPTOMS WORSEN. (Patient taking differently: Take 25 mg by mouth daily as needed for allergies. ) 30 capsule PRN   Emollient (MINERIN) LOTN APPLY TWICE DAILY 473 mL 3   hydrocortisone cream 1 % APPLY TO THE AFFECTED AREA(s) THREE TIMES DAILY. (Patient taking differently: Apply 1 application topically 3 (three) times daily. ) 28.35 g 2   loperamide (IMODIUM) 2 MG capsule TAKE 2 CAPSULES BY MOUTH AS NEEDED AFTER 2ND LOOSE STOOL REPEAT AFTERNEXT LOOSE STOOL MAX 3 DOSES, NOTIFY NURSE. (Patient taking differently: Take 4 mg by mouth as needed for diarrhea or loose stools. ) 30 capsule 5   MAPAP 500 MG tablet TAKE 2 TABS BY MOUTH EVERY 4 HRS AS NEEDED FOR HEADACHE/MILD TO MODERATE PAIN OR TEMP OF 100.3 & ABOVE IF TEMP UNRESOLVED AFTER 24 HRS MUST (Patient taking differently: Take 1,000 mg  by mouth every 4 (four) hours as needed for moderate pain or headache. ) 30 tablet 1   Neomycin-Bacitracin-Polymyxin (TRIPLE ANTIBIOTIC) [phone] OINT MINOR CUT/ABRASION:CLEAN W/ MILD SOAP/WATER THEN APPLY THIN LAYER TO AFFECTED AREA MAY COVER W/ BANDAID REMOVE AFTER 24 HRS NOTIFY NURSE OF (Patient taking differently: Apply 1 application topically as needed (cuts or abrasion). ) 28.4 g 2    PARoxetine (PAXIL) 20 MG tablet Take 20 mg by mouth at bedtime.      Polyethylene Glycol (LIP BALM BASE EX) Apply topically.     QC ANTACID/ANTI-GAS 200-200-20 MG/5ML suspension TAKE AS NEEDED FOR UPSET STOMACH. NO MORE THAN 3 DOSES IN 24 HOURS. NOTIFY NURSE IF SYMPTOMS PERSIST. (Patient taking differently: Take 10 mLs by mouth as needed for indigestion. ) 355 mL 5   QC MILK OF MAGNESIA 400 MG/5ML suspension TAKE (2 TBLS)AS NEEDED AFTER 3 DAYS NO BM MAY REPEAT X3 DOSES IFNO RESOLUTION CALL NURSE (CONSTIPATION) (Patient taking differently: Take 30 mLs by mouth as needed for mild constipation. ) 355 mL 3   risperiDONE (RISPERDAL) 1 MG tablet Take 1 mg by mouth 2 (two) times daily.     Sodium Bicarbonate (AFTER BITE EX) Apply topically as needed.     Sunscreens (COPPERTONE SPORT SPF30 EX) Apply topically.     TUSSIN 100 MG/5ML syrup TAKE (2TSP) BY MOUTH EVERY 6 HOURS AS NEEDED FOR COUGH/COLD MAX4 DOSES IN 24 HRS, NOTIFY NURSE IF PERSISTS. (Patient taking differently: Take 200 mg by mouth every 6 (six) hours as needed for cough. ) 118 mL 2   VENTOLIN HFA 108 (90 Base) MCG/ACT inhaler INHALE (2) PUFFS BY MOUTH 4 TIMES DAILY AS NEEDED FOR SHORTNESS OF BREATH. (Patient taking differently: Inhale 2 puffs into the lungs every 4 (four) hours as needed for wheezing or shortness of breath. ) 18 g 5   Emollient (LUBRICATING LOTION EX) Apply topically 2 (two) times daily.     ibuprofen (ADVIL,MOTRIN) 400 MG tablet Take 400 mg by mouth every 8 (eight) hours as needed.     loperamide (IMODIUM) 2 MG capsule Take by mouth as needed for diarrhea or loose stools.     loperamide (IMODIUM) 2 MG capsule TAKE 2 CAPSULES BY MOUTH AS NEEDED AFTER 2ND LOOSE STOOL. TAKE 1 CAPSULE AFTER NEXT LOOSE STOOL MAX 3 DOSES, NOTIFY NURSE. (Patient not taking: Reported on 07/07/2018) 30 capsule PRN    Home: Home Living Family/patient expects to be discharged to:: Group home Additional Comments: Not a lot of  infomation about group home available at time of writing this note  Functional History: Prior Function Comments: Not a lot of information available re: PLOF at time of writing this note, except that normally Michael Mercado is social and talkative; It is likely he was walking independently, and managing his basic ADLs in the group home setting Functional Status:  Mobility: Bed Mobility Overal bed mobility: Needs Assistance Bed Mobility: Supine to Sit Supine to sit: Total assist, +2 for physical assistance General bed mobility comments: max to Total assist to come sit to EOB Transfers Overall transfer level: Needs assistance Equipment used: 2 person hand held assist(and bed pad cradling hips) Transfers: Sit to/from Stand, Stand Pivot Transfers Sit to Stand: +2 physical assistance, Mod assist Stand pivot transfers: +2 physical assistance, Mod assist General transfer comment: heavy mod assist to power up to stand, knees blocked for safety; noted LE weakness, but no overt knee buckling, and appropriate recruitment of LE musculature to stabilize with incr time in  standing and with weight shifts/steps for transfer to recliner      ADL: ADL Overall ADL's : Needs assistance/impaired Eating/Feeding: NPO Grooming: Wash/dry hands, Wash/dry face, Total assistance, Sitting Upper Body Bathing: Total assistance, Sitting Lower Body Bathing: Total assistance, Sit to/from stand Upper Body Dressing : Total assistance, Sitting Lower Body Dressing: Total assistance, Sit to/from stand Toilet Transfer: Maximal assistance, +2 for physical assistance, +2 for safety/equipment, Stand-pivot, BSC Toileting- Clothing Manipulation and Hygiene: Total assistance, Sit to/from stand Functional mobility during ADLs: Maximal assistance, Moderate assistance, +2 for physical assistance, +2 for safety/equipment General ADL Comments: Pt unable to assist with ADLs, but he did spontaneously advance LEs during transfers    Cognition: Cognition Overall Cognitive Status: Difficult to assess Orientation Level: Other (comment)(mute) Cognition Arousal/Alertness: Lethargic(Eyes opened approx 40% of session) Behavior During Therapy: WFL for tasks assessed/performed Overall Cognitive Status: Difficult to assess Area of Impairment: Attention, Following commands, Safety/judgement, Awareness, Problem solving Current Attention Level: Focused Following Commands: Follows one step commands inconsistently, Follows one step commands with increased time Safety/Judgement: Decreased awareness of deficits, Decreased awareness of safety Problem Solving: Decreased initiation, Slow processing General Comments: Michael Mercado did not answer questions this session; he did respond actively to help raise LEs one at a time with sock donning, and offered his hand to replace L glove restraint Difficult to assess due to: Impaired communication, Level of arousal  Blood pressure (!) 147/65, pulse (!) 51, temperature 98.7 F (37.1 C), temperature source Oral, resp. rate 16, height 5' 6.25" (1.683 m), weight 73.4 kg, SpO2 97 %. Physical Exam  Constitutional:  Lethargic, appears comfortable  HENT:  Nasogastric tube in place, left crani incision dressed  Eyes: Pupils are equal, round, and reactive to light.  Neck: Normal range of motion.  Cardiovascular: Normal rate. Exam reveals no friction rub.  Respiratory: Effort normal. No respiratory distress. He exhibits tenderness.  GI: Soft. He exhibits no distension.  Musculoskeletal:     Comments: Pt lying in bed. Lethargic. Arouses to verbal stim. Inconsistently following 1 step commands. Withdraws to pain L>R. Moves left side preferentially to right.   Neurological:  Patient does arouse to verbal stimuli but was nonverbal. Opens eyes but does not make consistent eye contact. He did not follow commands. Exam overall is limited.  Psychiatric: He has a normal mood and affect. His behavior is normal.     Results for orders placed or performed during the hospital encounter of 07/07/18 (from the past 24 hour(s))  Glucose, capillary     Status: Abnormal   Collection Time: 07/10/18  8:20 AM  Result Value Ref Range   Glucose-Capillary 134 (H) 70 - 99 mg/dL   Comment 1 Notify RN    Comment 2 Document in Chart   Glucose, capillary     Status: Abnormal   Collection Time: 07/10/18 11:25 AM  Result Value Ref Range   Glucose-Capillary 108 (H) 70 - 99 mg/dL   Comment 1 Notify RN    Comment 2 Document in Chart   Glucose, capillary     Status: Abnormal   Collection Time: 07/10/18  4:05 PM  Result Value Ref Range   Glucose-Capillary 136 (H) 70 - 99 mg/dL   Comment 1 Notify RN    Comment 2 Document in Chart   Glucose, capillary     Status: Abnormal   Collection Time: 07/10/18  7:51 PM  Result Value Ref Range   Glucose-Capillary 124 (H) 70 - 99 mg/dL  Glucose, capillary     Status:  Abnormal   Collection Time: 07/10/18 11:07 PM  Result Value Ref Range   Glucose-Capillary 117 (H) 70 - 99 mg/dL  Glucose, capillary     Status: Abnormal   Collection Time: 07/11/18  3:31 AM  Result Value Ref Range   Glucose-Capillary 129 (H) 70 - 99 mg/dL   No results found.   Assessment/Plan: Diagnosis: 78 yo male with left hemispheric subdural hemorrhage after fall. 1. Does the need for close, 24 hr/day medical supervision in concert with the patient's rehab needs make it unreasonable for this patient to be served in a less intensive setting? Yes 2. Co-Morbidities requiring supervision/potential complications: HTN, MR with anxiety and schizophrenia 3. Due to bladder management, bowel management, safety, skin/wound care, disease management, medication administration, pain management and patient education, does the patient require 24 hr/day rehab nursing? Yes and Potentially 4. Does the patient require coordinated care of a physician, rehab nurse, PT (1-2 hrs/day, 5 days/week), OT (1-2 hrs/day, 5 days/week)  and SLP (1-2 hrs/day, 5 days/week) to address physical and functional deficits in the context of the above medical diagnosis(es)? Yes Addressing deficits in the following areas: balance, endurance, locomotion, strength, transferring, bowel/bladder control, bathing, dressing, feeding, grooming, toileting, cognition, speech, language, swallowing and psychosocial support 5. Can the patient actively participate in an intensive therapy program of at least 3 hrs of therapy per day at least 5 days per week? Yes and Potentially 6. The potential for patient to make measurable gains while on inpatient rehab is good 7. Anticipated functional outcomes upon discharge from inpatient rehab are supervision and min assist  with PT, supervision and min assist with OT, supervision and min assist with SLP. (?decrease the burden of care) 8. Estimated rehab length of stay to reach the above functional goals is: 20-27 days 9. Anticipated D/C setting: Home 10. Anticipated post D/C treatments: HH therapy 11. Overall Rehab/Functional Prognosis: excellent  RECOMMENDATIONS: This patient's condition is appropriate for continued rehabilitative care in the following setting: CIR Patient has agreed to participate in recommended program. N/A Note that insurance prior authorization may be required for reimbursement for recommended care.  Comment: Pt lives at group home. Will not return to independent level at the end of an inpatient rehab admit. Rehab Admissions Coordinator to follow up.  Thanks,  Ranelle Oyster, MD, [place] [facility]  I have personally performed a face to face diagnostic evaluation of this patient. Additionally, I have examined pertinent labs and radiographic images. I have reviewed and concur with the physician assistant's documentation above.    Mcarthur Rossetti Angiulli, PA-C 07/11/2018

## 2018-07-11 NOTE — Progress Notes (Signed)
Subjective: Patient resting in bed, nonverbal and unable to follow commands.   Objective: Vital signs in last 24 hours: Temp:  [98.3 F (36.8 C)-100.7 F (38.2 C)] 100.3 F (37.9 C) (03/23 0800) Pulse Rate:  [48-69] 60 (03/23 1000) Resp:  [13-21] 20 (03/23 1000) BP: (134-226)/(60-181) 164/89 (03/23 1000) SpO2:  [92 %-100 %] 97 % (03/23 1000)  Intake/Output from previous day: 03/22 0701 - 03/23 0700 In: 5338.8 [I.V.:2598.8; NG/GT:2340; IV Piggyback:400] Out: 1138 [Urine:1125; Drains:13] Intake/Output this shift: Total I/O In: 500.1 [I.V.:205.1; NG/GT:195; IV Piggyback:100] Out: -   Neurologic: Grossly normal, nonverbal and does not follow commands. Withdraws to pain in all extremities  Lab Results: Lab Results  Component Value Date   WBC 10.3 07/09/2018   HGB 10.5 (L) 07/09/2018   HCT 32.1 (L) 07/09/2018   MCV 87.7 07/09/2018   PLT 194 07/09/2018   Lab Results  Component Value Date   INR 1.0 07/07/2018   BMET Lab Results  Component Value Date   NA 141 07/09/2018   K 3.8 07/09/2018   CL 113 (H) 07/09/2018   CO2 20 (L) 07/09/2018   GLUCOSE 96 07/09/2018   BUN 17 07/09/2018   CREATININE 1.06 07/09/2018   CALCIUM 8.5 (L) 07/09/2018    Studies/Results: No results found.  Assessment/Plan: Head CT today. D/c hemovac and jp drain with very minimal output over the last 24 hours. Rehab candidate?   LOS: 4 days    Tiana Loft [facility] 07/11/2018, 10:33 AM

## 2018-07-11 NOTE — Progress Notes (Signed)
Occupational Therapy Treatment Patient Details Name: Michael Mercado MRN: [id number] DOB: 1940/06/15 Today's Date: 07/11/2018    History of present illness 78 year old Caucasian male with intellectual disability, anxiety and schizophrenia presented with AMS/unresponsive and R sided neurologic deficits.  Found to have a large L SDH with midline shift. s/p L crani for evacuation of subdural hematoma   OT comments  Pt seen with PT today.  He was unable to achieve full standing and unable to pivot to chair today (required use of maxi sky lift for).  He however, did follow one step commands to look at me and to lift his Lt UE.  He is looking to Rt more spontaneously today, and is beginning to explore his environment more with Lt UE.  Continue to feel that with repetition, consistency and the intensity  Therapies, he is capable of increasing his ability to engage in ADLs, and therefore, continue to recommend CIR to reduce burden of care.  Will follow acutely.   Follow Up Recommendations  CIR    Equipment Recommendations  None recommended by OT    Recommendations for Other Services Rehab consult    Precautions / Restrictions Precautions Precautions: Fall Precaution Comments: cortrak       Mobility Bed Mobility Overal bed mobility: Needs Assistance Bed Mobility: Supine to Sit     Supine to sit: Total assist;+2 for physical assistance     General bed mobility comments: total +2 assist to roll to left and rise to sitting EOB  Transfers Overall transfer level: Needs assistance Equipment used: 2 person hand held assist Transfers: Sit to/from Stand Sit to Stand: From elevated surface;Total assist;+2 physical assistance Stand pivot transfers: +2 physical assistance;Mod assist       General transfer comment: attempted standing x 4 trials with total assist bil knees blocked, assist of pad and belt to rise with no initiation from elevated surface or with attempt of stedy. Able to  clear sacrum with total assist but pt with arms hanging, no attempt to assist and crouched posture even with weight shifting. Maxi sky for transition EOB to chair    Balance Overall balance assessment: Needs assistance Sitting-balance support: Feet supported;No upper extremity supported Sitting balance-Leahy Scale: Poor Sitting balance - Comments: Pt initially required mod A, but progressed to close min guard for static sitting      Standing balance-Leahy Scale: Zero Standing balance comment: Unable to extend hips and trunk in standing                            ADL either performed or assessed with clinical judgement   ADL Overall ADL's : Needs assistance/impaired     Grooming: Wash/dry hands;Wash/dry face;Maximal assistance;Sitting                   Toilet Transfer: Total assistance Toilet Transfer Details (indicate cue type and reason): unable this date                  Vision   Additional Comments: Pt continues with severely dysconjugate gaze (unsure of baseline).  He will look to the Rt today with auditory stimulation ~50% of the time    Perception     Praxis      Cognition Arousal/Alertness: Awake/alert;Lethargic Behavior During Therapy: Flat affect Overall Cognitive Status: Impaired/Different from baseline Area of Impairment: Attention  Current Attention Level: Focused   Following Commands: Follows one step commands inconsistently;Follows one step commands with increased time     Problem Solving: Slow processing;Decreased initiation;Requires verbal cues General Comments: Pt looked to therapist on his Rt side, He looked to me on command `~30% of the time.  He lifted Lt UE to assist with donning restraint mitt         Exercises     Shoulder Instructions       General Comments Pt noted to reach up with Lt UE to scratch his temple and to occasionally explore his space.  He did rock back and forth and tap Lt toe  in response to music     Pertinent Vitals/ Pain       Pain Assessment: No/denies pain  Home Living                                          Prior Functioning/Environment              Frequency  Min 2X/week        Progress Toward Goals  OT Goals(current goals can now be found in the care plan section)  Progress towards OT goals: Progressing toward goals     Plan Discharge plan remains appropriate    Co-evaluation    PT/OT/SLP Co-Evaluation/Treatment: Yes Reason for Co-Treatment: Complexity of the patient's impairments (multi-system involvement);Necessary to address cognition/behavior during functional activity;For patient/therapist safety;To address functional/ADL transfers PT goals addressed during session: Mobility/safety with mobility;Balance OT goals addressed during session: Proper use of Adaptive equipment and DME      AM-PAC OT "6 Clicks" Daily Activity     Outcome Measure   Help from another person eating meals?: Total Help from another person taking care of personal grooming?: Total Help from another person toileting, which includes using toliet, bedpan, or urinal?: Total Help from another person bathing (including washing, rinsing, drying)?: Total Help from another person to put on and taking off regular upper body clothing?: Total Help from another person to put on and taking off regular lower body clothing?: Total 6 Click Score: 6    End of Session Equipment Utilized During Treatment: Gait belt  OT Visit Diagnosis: Unsteadiness on feet (R26.81);Muscle weakness (generalized) (M62.81);Feeding difficulties (R63.3);Cognitive communication deficit (R41.841);Hemiplegia and hemiparesis Symptoms and signs involving cognitive functions: Cerebral infarction Hemiplegia - Right/Left: Right Hemiplegia - dominant/non-dominant: Dominant Hemiplegia - caused by: Cerebral infarction   Activity Tolerance Patient tolerated treatment well   Patient  Left in chair;with call bell/phone within reach;with chair alarm set;with restraints reapplied   Nurse Communication Mobility status;Need for lift equipment        Time: 8144-8185 OT Time Calculation (min): 32 min  Charges: OT General Charges $OT Visit: 1 Visit OT Treatments $Neuromuscular Re-education: 8-22 mins  Jeani Hawking, OTR/L Acute Rehabilitation Services Pager [phone] Office [phone]    Jeani Hawking M 07/11/2018, 3:57 PM

## 2018-07-11 NOTE — TOC Transition Note (Signed)
Transition of Care [facility]) - CM/SW Discharge Note   Patient Details  Name: Michael Mercado MRN: [id number] Date of Birth: February 13, 1941  Transition of Care [facility]) CM/SW Contact:  Maree Krabbe, LCSW Phone Number: 07/11/2018, 2:25 PM   Clinical Narrative:   Pt is disoriented x4. NO family contacts noted. CSW spoke with [name] with [facility]. [name] confirmed pt is his own guardian however she believes pt has not been oriented enough to fulfill this role and make his own decisions. [name] states even on his good days he is not able to be his own decision maker. [name] speaks about a niece, Lorra Hals [phone]) that may be willing to assist in decision making. Other than Leighanne there is no other family involved. CSW to reach out to niece to discuss plan as pt will need SNF prior to returning back to the group home.      Barriers to Discharge: Continued Medical Work up   Patient Goals and CMS Choice Patient states their goals for this hospitalization and ongoing recovery are:: to return to group home after SNF per Group Home director      Discharge Placement  Pt will need SNF at d/c. Pt is own guardian however is disoriented x4. CSW will attempt to reach pt's niece to discuss plan in hopes that she will assist in placement decisions for pt.                     Discharge Plan and Services In-house Referral: Clinical Social Work   Post Acute Care Choice: Skilled Nursing Facility                    Social Determinants of Health (SDOH) Interventions     Readmission Risk Interventions No flowsheet data found.

## 2018-07-11 NOTE — Plan of Care (Signed)
Pt has Cortrak with tube feeds currently infusing.  Speech sees patient daily to determine when a swallow evaluation can be completed.  Patient is not currently interactive and not following directions.  Will continue to monitor  Jaclyn Shaggy RN

## 2018-07-11 NOTE — Progress Notes (Signed)
  Speech Language Pathology Treatment: Dysphagia  Patient Details Name: Michael Mercado MRN: [id number] DOB: 1940/05/28 Today's Date: 07/11/2018 Time: 1001-1009 SLP Time Calculation (min) (ACUTE ONLY): 8 min  Assessment / Plan / Recommendation Clinical Impression  Poorly responsive; opens eyes to name but did not follow commands nor vocalize despite max verbal/tactile prompts.  Ice chips at lips/anterior tongue elicited no response, no reaction.  Ice removed from mouth. No spontaneous swallow observed.  Continue NPO with cortrak.  D/W RN.   sHPI HPI: Pt is a 78 year old male with history of moderate intellectual disablity, tobacco abuse, COPD,  HTN, HLD, CKD stage II who presented to [facility] 07/06/[facility] after fall.  Apparently he slipped and fell getting out of shower but denied LOC.  He was evaluated, asymptomatic, and sent home but then returned on 07/07/18 after additional fall from bed around 0115. At that time he was unresponsive with right facial droop and hypertensive. Code stroke activated. CT head showed large subdural hematoma 19 mm of left-to-right shift.  He was intubated for airway protection and transferred emergently to [facility] for Neurosurgery evaluation. Left craniotomy was completed on 07/07/18 for exacuation and pt was extubated following surgery.       SLP Plan  Continue with current plan of care       Recommendations  Diet recommendations: NPO Medication Administration: Via alternative means                Oral Care Recommendations: Oral care QID Follow up Recommendations: 24 hour supervision/assistance SLP Visit Diagnosis: Dysphagia, oropharyngeal phase (R13.12) Plan: Continue with current plan of care       GO                Blenda Mounts Laurice 07/11/2018, 10:10 AM  Marchelle Folks L. Samson Frederic, MA CCC/SLP Acute Rehabilitation Services Office number [phone] Pager [phone]

## 2018-07-11 NOTE — Progress Notes (Signed)
Physical Therapy Treatment Patient Details Name: Michael Mercado MRN: [id number] DOB: 20-Aug-1940 Today's Date: 07/11/2018    History of Present Illness 78 year old Caucasian male with intellectual disability, anxiety and schizophrenia presented with AMS/unresponsive and R sided neurologic deficits.  Found to have a large L SDH with midline shift. s/p L crani for evacuation of subdural hematoma    PT Comments    Pt with eyes closed, no verbalizations and very minimal movement of LUE during today's session. No activation of RUE or RLE pt with maintained right knee flexion assisted into flexion to roll. PT with assisted eye opening with maintained disconjugate gaze no assist to stand or shift weight this session. [place] for transfers at this point recommended. Will continue to follow.     Follow Up Recommendations  SNF;Supervision/Assistance - 24 hour     Equipment Recommendations  Other (comment)(TBD)    Recommendations for Other Services       Precautions / Restrictions Precautions Precautions: Fall Precaution Comments: cortrak    Mobility  Bed Mobility Overal bed mobility: Needs Assistance Bed Mobility: Supine to Sit     Supine to sit: Total assist;+2 for physical assistance     General bed mobility comments: total +2 assist to roll to left and rise to sitting EOB  Transfers Overall transfer level: Needs assistance   Transfers: Sit to/from Stand Sit to Stand: From elevated surface;Total assist;+2 physical assistance         General transfer comment: attempted standing x 4 trials with total assist bil knees blocked, assist of pad and belt to rise with no initiation from elevated surface or with attempt of stedy. Able to clear sacrum with total assist but pt with arms hanging, no attempt to assist and crouched posture even with weight shifting. Maxi sky for transition EOB to chair  Ambulation/Gait                 Stairs             Wheelchair  Mobility    Modified Rankin (Stroke Patients Only) Modified Rankin (Stroke Patients Only) Pre-Morbid Rankin Score: No significant disability Modified Rankin: Severe disability     Balance Overall balance assessment: Needs assistance Sitting-balance support: Feet supported;No upper extremity supported Sitting balance-Leahy Scale: Fair Sitting balance - Comments: initial mod assist with progression to minguard with pt with slight sway with old country music playing     Standing balance-Leahy Scale: Zero Standing balance comment: crouched posture with 2 person total assist                            Cognition Arousal/Alertness: Lethargic Behavior During Therapy: Flat affect Overall Cognitive Status: Difficult to assess Area of Impairment: Attention                   Current Attention Level: Focused         Problem Solving: Decreased initiation General Comments: pt not maintaining eyes open or following commands this session. Flat affect, non verbal. Would hold knees in flexion when assisted but otherwise not assistin      Exercises      General Comments        Pertinent Vitals/Pain Pain Assessment: (CPOT= 0) Faces Pain Scale: No hurt    Home Living                      Prior Function  PT Goals (current goals can now be found in the care plan section) Progress towards PT goals: Not progressing toward goals - comment    Frequency    Min 3X/week      PT Plan Frequency needs to be updated;Discharge plan needs to be updated    Co-evaluation PT/OT/SLP Co-Evaluation/Treatment: Yes Reason for Co-Treatment: Complexity of the patient's impairments (multi-system involvement);For patient/therapist safety;Necessary to address cognition/behavior during functional activity PT goals addressed during session: Mobility/safety with mobility;Balance        AM-PAC PT "6 Clicks" Mobility   Outcome Measure  Help needed turning  from your back to your side while in a flat bed without using bedrails?: Total Help needed moving from lying on your back to sitting on the side of a flat bed without using bedrails?: Total Help needed moving to and from a bed to a chair (including a wheelchair)?: Total Help needed standing up from a chair using your arms (e.g., wheelchair or bedside chair)?: Total Help needed to walk in hospital room?: Total Help needed climbing 3-5 steps with a railing? : Total 6 Click Score: 6    End of Session Equipment Utilized During Treatment: Gait belt Activity Tolerance: Patient tolerated treatment well Patient left: in chair;with call bell/phone within reach;with chair alarm set Nurse Communication: Mobility status;Need for lift equipment PT Visit Diagnosis: Unsteadiness on feet (R26.81);Other abnormalities of gait and mobility (R26.89);Other symptoms and signs involving the nervous system (R29.898)     Time: 8177-1165 PT Time Calculation (min) (ACUTE ONLY): 30 min  Charges:  $Therapeutic Activity: 8-22 mins                     Trayton Szabo Abner Greenspan, PT Acute Rehabilitation Services Pager: [phone] Office: [phone]    Enedina Finner Etha Stambaugh 07/11/2018, 12:25 PM

## 2018-07-12 ENCOUNTER — Other Ambulatory Visit: Payer: Self-pay | Admitting: Neurological Surgery

## 2018-07-12 LAB — GLUCOSE, CAPILLARY
Glucose-Capillary: 120 mg/dL — ABNORMAL HIGH (ref 70–99)
Glucose-Capillary: 118 mg/dL — ABNORMAL HIGH (ref 70–99)
Glucose-Capillary: 117 mg/dL — ABNORMAL HIGH (ref 70–99)
Glucose-Capillary: 129 mg/dL — ABNORMAL HIGH (ref 70–99)
Glucose-Capillary: 122 mg/dL — ABNORMAL HIGH (ref 70–99)
Glucose-Capillary: 103 mg/dL — ABNORMAL HIGH (ref 70–99)

## 2018-07-12 MED ORDER — MUPIROCIN 2 % EX OINT
1.0000 "application " | TOPICAL_OINTMENT | Freq: Two times a day (BID) | CUTANEOUS | Status: DC
  Administered 2018-07-13 (×2): 1 via NASAL
  Filled 2018-07-12: qty 22

## 2018-07-12 NOTE — Progress Notes (Signed)
Occupational Therapy Treatment Patient Details Name: Michael Mercado MRN: [id number] DOB: 17-Nov-1940 Today's Date: 07/12/2018    History of present illness 78 year old Caucasian male with intellectual disability, anxiety and schizophrenia presented with AMS/unresponsive and R sided neurologic deficits.  Found to have a large L SDH with midline shift. s/p L crani for evacuation of subdural hematoma   OT comments  This 78 yo male admitted with above presents to acute OT today not really showing any progress from yesterday due to lethargy and decreased ability to follow commands. He did tolerate sitting EOB for ~10 minutes with fair to poor balance and we were able to transfer him today to recliner without lift--but he did not A with transfer. He will continue to benefit from acute OT with follow up now recommended at SNF (CIR is still following him pending progress).  Vital signs were stable.   Follow Up Recommendations  SNF;Supervision/Assistance - 24 hour    Equipment Recommendations  Other (comment)(to be determined next venue)       Precautions / Restrictions Precautions Precautions: Fall Precaution Comments: cortrak Restrictions Weight Bearing Restrictions: No       Mobility Bed Mobility Overal bed mobility: Needs Assistance Bed Mobility: Supine to Sit     Supine to sit: Total assist;+2 for physical assistance        Transfers Overall transfer level: Needs assistance Equipment used: 2 person hand held assist Transfers: Sit to/from Stand;Squat Pivot Transfers Sit to Stand: From elevated surface;Total assist;+2 physical assistance Stand pivot transfers: Total assist;+2 physical assistance;From elevated surface       General transfer comment: Stood x2 at EOB with total A +2; stand-pivot from bed to recliner going to pt's left with use of pad and gait belt with total A+2    Balance Overall balance assessment: Needs assistance Sitting-balance support: Feet  supported;No upper extremity supported Sitting balance-Leahy Scale: Poor Sitting balance - Comments: Poor at EOB with bed not truly flat, pt with tendency to lean to right;  in recliner he was fair to poor sitting with back not against back of recliner   Standing balance support: No upper extremity supported Standing balance-Leahy Scale: Zero Standing balance comment: Unable to extend hips and trunk in standing                            ADL either performed or assessed with clinical judgement   ADL Overall ADL's : Needs assistance/impaired                         Toilet Transfer: Total assistance;+2 for physical assistance;Stand-pivot Toilet Transfer Details (indicate cue type and reason): use of bed pad and gait belt; bed to recliner going to pt's left           General ADL Comments: Pt total A for all basic ADLs. Attempted hand over hand with pt washing his face with LUE while seated EOB, but pt guarding my A to help him. (earlier while supine in bed pt reached up with LUE to wipe at nose--so the movement is there)     Vision   Additional Comments: Pt with no eye opening while supine in bed and minimal eye opening at EOB with left eye far left and right eye midline. No movement of eyes noted under closed eye lids          Cognition Arousal/Alertness: Lethargic Behavior During Therapy: Flat affect  Overall Cognitive Status: Impaired/Different from baseline Area of Impairment: Attention;Following commands;Safety/judgement;Problem solving                   Current Attention Level: Focused   Following Commands: Follows one step commands inconsistently;Follows one step commands with increased time Safety/Judgement: Decreased awareness of deficits;Decreased awareness of safety   Problem Solving: Slow processing;Decreased initiation;Requires verbal cues;Requires tactile cues;Difficulty sequencing General Comments: Pt followed only one command today  with increased time with LUE ("give me 5")                   Pertinent Vitals/ Pain       Pain Assessment: No/denies pain Faces Pain Scale: No hurt         Frequency  Min 2X/week        Progress Toward Goals  OT Goals(current goals can now be found in the care plan section)  Progress towards OT goals: Not progressing toward goals - comment(due to lethargy and not able to presently follow commands)     Plan Discharge plan needs to be updated    Co-evaluation    PT/OT/SLP Co-Evaluation/Treatment: Yes Reason for Co-Treatment: Complexity of the patient's impairments (multi-system involvement);Necessary to address cognition/behavior during functional activity;For patient/therapist safety;To address functional/ADL transfers PT goals addressed during session: Balance;Mobility/safety with mobility;Strengthening/ROM OT goals addressed during session: ADL's and self-care;Strengthening/ROM      AM-PAC OT "6 Clicks" Daily Activity     Outcome Measure   Help from another person eating meals?: Total Help from another person taking care of personal grooming?: Total Help from another person toileting, which includes using toliet, bedpan, or urinal?: Total Help from another person bathing (including washing, rinsing, drying)?: Total Help from another person to put on and taking off regular upper body clothing?: Total Help from another person to put on and taking off regular lower body clothing?: Total 6 Click Score: 6    End of Session Equipment Utilized During Treatment: Gait belt  OT Visit Diagnosis: Muscle weakness (generalized) (M62.81);Feeding difficulties (R63.3);Cognitive communication deficit (R41.841);Hemiplegia and hemiparesis;Other abnormalities of gait and mobility (R26.89) Symptoms and signs involving cognitive functions: Cerebral infarction Hemiplegia - Right/Left: Right Hemiplegia - dominant/non-dominant: Dominant Hemiplegia - caused by: Cerebral infarction    Activity Tolerance Patient tolerated treatment well   Patient Left in chair;with call bell/phone within reach;with chair alarm set   Nurse Communication Mobility status;Need for lift equipment        Time: 1552-0802 OT Time Calculation (min): 25 min  Charges: OT General Charges $OT Visit: 1 Visit OT Treatments $Self Care/Home Management : 8-22 mins  [name], OTR/L Acute [employer] Pager [phone] Office [phone]      [name] 07/12/2018, 11:16 AM

## 2018-07-12 NOTE — Progress Notes (Signed)
IP rehab admissions - I spoke with PT/OT during patient treatment today.  PT/OT now recommending SNF.  I am not sure if we would get functional gains with a CIR admission.  I would like to follow progress this week.  Ultimately I think patient will need SNF as per Dr. Riley Kill consult.  Call me for questions.  [phone]

## 2018-07-12 NOTE — Progress Notes (Signed)
  Speech Language Pathology Treatment: Dysphagia  Patient Details Name: Michael Mercado MRN: [id number] DOB: 06/13/1940 Today's Date: 07/12/2018 Time: 1221-1238 SLP Time Calculation (min) (ACUTE ONLY): 17 min  Assessment / Plan / Recommendation Clinical Impression  Michael Mercado sitting in chair asleep and momentarily opened eyes when stimulated with auditory and verbal input. Lips dry, peeling, tongue coated (candidias) and large mass of dried mucous on mid section of hard palate. SLP utilized ice chip, moist toothette, [place] to remove mucous over a period of 15 minutes. Dry swallows observed x 2 prior to ice presentation. No awareness of ice or ability to mobilize despite max cues. During that time his level of alertness did not improve. For pt to return to po's his pt's alertness, awareness and ability to manage secretions will need to drastically improve.    HPI HPI: Pt is a 78 year old male with history of moderate intellectual disablity, tobacco abuse, COPD,  HTN, HLD, CKD stage II who presented to [facility] 07/06/18 after fall.  Apparently he slipped and fell getting out of shower but denied LOC.  He was evaluated, asymptomatic, and sent home but then returned on 07/07/18 after additional fall from bed around 0115. At that time he was unresponsive with right facial droop and hypertensive. Code stroke activated. CT head showed large subdural hematoma 19 mm of left-to-right shift.  He was intubated for airway protection and transferred emergently to [facility] for Neurosurgery evaluation. Left craniotomy was completed on 07/07/18 for exacuation and pt was extubated following surgery.       SLP Plan  Continue with current plan of care       Recommendations  Diet recommendations: NPO Medication Administration: Via alternative means                Oral Care Recommendations: Oral care QID Follow up Recommendations: Skilled Nursing facility SLP Visit Diagnosis: Dysphagia,  oral phase (R13.11) Plan: Continue with current plan of care                      Michael Mercado 07/12/2018, 2:08 PM   Michael Mercado Michael Mercado.Ed Nurse, children's [phone] Office [phone]

## 2018-07-12 NOTE — Progress Notes (Addendum)
Physical Therapy Treatment Patient Details Name: Michael Mercado MRN: [id number] DOB: 1940/08/04 Today's Date: 07/12/2018    History of Present Illness 78 year old Caucasian male with intellectual disability, anxiety and schizophrenia presented with AMS/unresponsive and R sided neurologic deficits.  Found to have a large L SDH with midline shift. s/p L crani for evacuation of subdural hematoma    PT Comments    Pt without significant change in function or interaction from prior 2 sessions. Pt remains non-verbal with very limited interaction. Pt did demonstrate attempt at righting reaction with sitting EOB and lifting LUE and LLE to attempt to balance with posterior LOB. Pt with very limited eye opening (squinting) throughout session and only responded to a single 1 step command during session. Contacted group home who stated pt could be WC level but would need to do some things for himself to return there. Will continue to follow with mobility per nursing and lift recommended daily.     Follow Up Recommendations  SNF;Supervision/Assistance - 24 hour     Equipment Recommendations  Hospital bed    Recommendations for Other Services       Precautions / Restrictions Precautions Precautions: Fall Precaution Comments: cortrak Restrictions Weight Bearing Restrictions: No    Mobility  Bed Mobility Overal bed mobility: Needs Assistance Bed Mobility: Rolling;Sidelying to Sit Rolling: Total assist;+2 for physical assistance Sidelying to sit: Total assist;+2 for physical assistance Supine to sit: Total assist;+2 for physical assistance     General bed mobility comments: physical assist to bend knees, roll left and rise trunk into sitting  Transfers Overall transfer level: Needs assistance Equipment used: 2 person hand held assist Transfers: Sit to/from Stand;Stand Pivot Transfers Sit to Stand: From elevated surface;Total assist;+2 physical assistance Stand pivot transfers: Total  assist;+2 physical assistance;From elevated surface       General transfer comment: Stood x2 at EOB with total A +2; stand-pivot from bed to recliner going to pt's left with use of pad and gait belt with total A+2. Pt with RLE blocked and supported for weight bearing  Ambulation/Gait             General Gait Details: unable   Stairs             Wheelchair Mobility    Modified Rankin (Stroke Patients Only) Modified Rankin (Stroke Patients Only) Pre-Morbid Rankin Score: No significant disability Modified Rankin: Severe disability     Balance Overall balance assessment: Needs assistance Sitting-balance support: Feet supported;No upper extremity supported Sitting balance-Leahy Scale: Poor Sitting balance - Comments: Poor at EOB with bed not truly flat, pt with tendency to lean to right;  in recliner he was fair to poor sitting with back not against back of recliner   Standing balance support: No upper extremity supported Standing balance-Leahy Scale: Zero Standing balance comment: Unable to extend hips and trunk in standing                             Cognition Arousal/Alertness: Lethargic Behavior During Therapy: Flat affect Overall Cognitive Status: Impaired/Different from baseline Area of Impairment: Attention;Following commands;Safety/judgement;Problem solving                   Current Attention Level: Focused   Following Commands: Follows one step commands inconsistently;Follows one step commands with increased time Safety/Judgement: Decreased awareness of deficits;Decreased awareness of safety   Problem Solving: Slow processing;Decreased initiation;Requires verbal cues;Requires tactile cues;Difficulty sequencing General Comments:  Pt followed only one command today with increased time with LUE ("give me 5")      Exercises      General Comments        Pertinent Vitals/Pain Pain Assessment: (CPOT= 0) Faces Pain Scale: No hurt     Home Living                      Prior Function            PT Goals (current goals can now be found in the care plan section) Progress towards PT goals: Not progressing toward goals - comment;Goals downgraded-see care plan    Frequency    Min 3X/week      PT Plan Current plan remains appropriate    Co-evaluation PT/OT/SLP Co-Evaluation/Treatment: Yes Reason for Co-Treatment: Complexity of the patient's impairments (multi-system involvement);Necessary to address cognition/behavior during functional activity;For patient/therapist safety PT goals addressed during session: Mobility/safety with mobility;Balance OT goals addressed during session: ADL's and self-care;Strengthening/ROM      AM-PAC PT "6 Clicks" Mobility   Outcome Measure  Help needed turning from your back to your side while in a flat bed without using bedrails?: Total Help needed moving from lying on your back to sitting on the side of a flat bed without using bedrails?: Total Help needed moving to and from a bed to a chair (including a wheelchair)?: Total Help needed standing up from a chair using your arms (e.g., wheelchair or bedside chair)?: Total Help needed to walk in hospital room?: Total Help needed climbing 3-5 steps with a railing? : Total 6 Click Score: 6    End of Session Equipment Utilized During Treatment: Gait belt Activity Tolerance: Patient tolerated treatment well Patient left: in chair;with call bell/phone within reach;with chair alarm set Nurse Communication: Mobility status;Need for lift equipment PT Visit Diagnosis: Unsteadiness on feet (R26.81);Other abnormalities of gait and mobility (R26.89);Other symptoms and signs involving the nervous system (R29.898)     Time: 1016-1040 PT Time Calculation (min) (ACUTE ONLY): 24 min  Charges:  $Therapeutic Activity: 8-22 mins                     Tramell Piechota Abner Greenspan, PT Acute Rehabilitation Services Pager: [phone] Office:  [phone]    Enedina Finner Steele Stracener 07/12/2018, 11:38 AM

## 2018-07-12 NOTE — Progress Notes (Signed)
Patient ID: Michael Mercado, male   DOB: 1940/04/25, 78 y.o.   MRN: [id number] Aphasic and R hemiparetic, incision looks good, opens eyes, maintaining airway, feeding tube in place, CT head reviewed, and as expected has recurrent hematoma but looks to be under less pressure and my feeling is it may be best to let is liquify and resolve on its own without repeat surgery. Difficult decision and I believe either decision is reasonable, and I've asked a couple of my partners and they agree that observation at this point is quite reasonable.

## 2018-07-12 NOTE — NC FL2 (Signed)
[place] MEDICAID FL2 LEVEL OF CARE SCREENING TOOL     IDENTIFICATION  Patient Name: Michael Mercado Birthdate: [birth year] Sex: male Admission Date (Current Location): 07/07/2018  [facility] and [place] Number:  Producer, television/film/video and Address:  [facility], [address] [place], [place] [address]      Provider Number: [id number]  Attending Physician Name and Address:  Tia Alert, MD  Relative Name and Phone Number:  Lorra Hals [phone]    Current Level of Care: Hospital Recommended Level of Care: Skilled Nursing Facility Prior Approval Number:    Date Approved/Denied:   PASRR Number: pending  Discharge Plan: SNF    Current Diagnoses: Patient Active Problem List   Diagnosis Date Noted  . Subdural hematoma (HCC) 07/07/2018  . Endotracheally intubated 07/07/2018  . Encounter for postanesthesia care 07/07/2018  . CKD (chronic kidney disease), stage II 03/18/2016  . HTN (hypertension), benign 03/18/2015  . COPD (chronic obstructive pulmonary disease) (HCC) 03/18/2015  . Intellectual disability 03/18/2015  . Schizophrenia (HCC) 03/18/2015  . Hyperlipidemia LDL goal <130 03/18/2015    Orientation RESPIRATION BLADDER Height & Weight     (disoriented x4)  Normal External catheter, Incontinent(placed 3/20) Weight: 161 lb 13.1 oz (73.4 kg) Height:  5' 6.25" (168.3 cm)  BEHAVIORAL SYMPTOMS/MOOD NEUROLOGICAL BOWEL NUTRITION STATUS      Incontinent Diet, Feeding tube(cortrak feeding tube)  AMBULATORY STATUS COMMUNICATION OF NEEDS Skin   Extensive Assist Verbally Surgical wounds(closed incision, left hand, guaze dresing, change PRN.)                       Personal Care Assistance Level of Assistance  Bathing, Feeding, Dressing Bathing Assistance: Maximum assistance Feeding assistance: Limited assistance Dressing Assistance: Maximum assistance     Functional Limitations Info  Sight, Hearing, Speech Sight Info:  Adequate Hearing Info: Adequate Speech Info: Adequate    SPECIAL CARE FACTORS FREQUENCY  PT (By licensed PT), OT (By licensed OT)     PT Frequency: 5x OT Frequency: 5x            Contractures Contractures Info: Not present    Additional Factors Info  Code Status, Allergies Code Status Info: Full Code Allergies Info: No known allergies           Current Medications (07/12/2018):  This is the current hospital active medication list Current Facility-Administered Medications  Medication Dose Route Frequency Provider Last Rate Last Dose  . 0.9 % NaCl with KCl 20 mEq/ L  infusion   Intravenous Continuous Tia Alert, MD 75 mL/hr at 07/12/18 0900    . acetaminophen (TYLENOL) solution 650 mg  650 mg Per Tube Q4H PRN Barnett Abu, MD   650 mg at 07/10/18 1633  . acetaminophen (TYLENOL) tablet 650 mg  650 mg Oral Q4H PRN Tia Alert, MD       Or  . acetaminophen (TYLENOL) suppository 650 mg  650 mg Rectal Q4H PRN Tia Alert, MD   650 mg at 07/09/18 0848  . chlorhexidine (PERIDEX) 0.12 % solution 15 mL  15 mL Mouth Rinse BID Tia Alert, MD   15 mL at 07/11/18 2135  . feeding supplement (JEVITY 1.2 CAL) liquid 1,000 mL  1,000 mL Per Tube Continuous Tia Alert, MD 65 mL/hr at 07/12/18 0921 1,000 mL at 07/12/18 0921  . fentaNYL (SUBLIMAZE) injection 25-50 mcg  25-50 mcg Intravenous Q2H PRN Marcelle Smiling, MD      .  hydrALAZINE (APRESOLINE) injection 10-20 mg  10-20 mg Intravenous Q4H PRN Duayne Cal, NP   20 mg at 07/12/18 0929  . ipratropium-albuterol (DUONEB) 0.5-2.5 (3) MG/3ML nebulizer solution 3 mL  3 mL Nebulization Q4H PRN Agarwala, Ravi, MD      . labetalol (NORMODYNE,TRANDATE) injection 10-40 mg  10-40 mg Intravenous Q10 min PRN Tia Alert, MD      . levETIRAcetam (KEPPRA) IVPB 500 mg/100 mL premix  500 mg Intravenous Q12H Tia Alert, MD   Stopped at 07/12/18 [id number]  . MEDLINE mouth rinse  15 mL Mouth Rinse q12n4p Tia Alert, MD   15 mL at  07/11/18 1711  . ondansetron (ZOFRAN) tablet 4 mg  4 mg Oral Q4H PRN Tia Alert, MD       Or  . ondansetron [facility]) injection 4 mg  4 mg Intravenous Q4H PRN Tia Alert, MD      . pantoprazole (PROTONIX) injection 40 mg  40 mg Intravenous QHS Tia Alert, MD   40 mg at 07/11/18 2129  . promethazine (PHENERGAN) tablet 12.5-25 mg  12.5-25 mg Oral Q4H PRN Tia Alert, MD      . senna [facility]) tablet 8.6 mg  1 tablet Per Tube BID Tia Alert, MD   8.6 mg at 07/12/18 3143     Discharge Medications: Please see discharge summary for a list of discharge medications.  Relevant Imaging Results:  Relevant Lab Results:   Additional Information SSN: [id number]  Maree Krabbe, LCSW

## 2018-07-13 ENCOUNTER — Inpatient Hospital Stay (HOSPITAL_COMMUNITY): Payer: Medicare Other | Admitting: Certified Registered Nurse Anesthetist

## 2018-07-13 ENCOUNTER — Encounter (HOSPITAL_COMMUNITY)
Admission: EM | Disposition: A | Discharge status: Rehabilitation Facility | Payer: Self-pay | Source: Home / Self Care | Attending: Neurological Surgery

## 2018-07-13 HISTORY — PX: CRANIOTOMY: SHX93

## 2018-07-13 LAB — TYPE AND SCREEN
ABO/RH(D): A POS
Antibody Screen: NEGATIVE

## 2018-07-13 LAB — GLUCOSE, CAPILLARY
GLUCOSE-CAPILLARY: 122 mg/dL — AB (ref 70–99)
GLUCOSE-CAPILLARY: 98 mg/dL (ref 70–99)
Glucose-Capillary: 102 mg/dL — ABNORMAL HIGH (ref 70–99)
Glucose-Capillary: 117 mg/dL — ABNORMAL HIGH (ref 70–99)
Glucose-Capillary: 117 mg/dL — ABNORMAL HIGH (ref 70–99)
Glucose-Capillary: 122 mg/dL — ABNORMAL HIGH (ref 70–99)

## 2018-07-13 LAB — SURGICAL PCR SCREEN
MRSA, PCR: NEGATIVE
Staphylococcus aureus: NEGATIVE

## 2018-07-13 LAB — ABO/RH: ABO/RH(D): A POS

## 2018-07-13 SURGERY — CRANIOTOMY HEMATOMA EVACUATION SUBDURAL
Anesthesia: General | Site: Head | Laterality: Left

## 2018-07-13 MED ORDER — ONDANSETRON HCL 4 MG/2ML IJ SOLN: 4.0000 mg | Freq: Once | INTRAMUSCULAR | Status: DC | PRN

## 2018-07-13 MED ORDER — LABETALOL HCL 5 MG/ML IV SOLN
10.0000 mg | INTRAVENOUS | Status: DC | PRN
  Administered 2018-07-13 (×3): 20 mg via INTRAVENOUS
  Administered 2018-07-13: 10 mg via INTRAVENOUS
  Administered 2018-07-15 – 2018-07-17 (×4): 20 mg via INTRAVENOUS
  Filled 2018-07-13 (×2): qty 8
  Filled 2018-07-13 (×4): qty 4
  Filled 2018-07-13: qty 8

## 2018-07-13 MED ORDER — CEFAZOLIN SODIUM-DEXTROSE 2-4 GM/100ML-% IV SOLN
2.0000 g | INTRAVENOUS | Status: AC
  Administered 2018-07-13: 2 g via INTRAVENOUS

## 2018-07-13 MED ORDER — HEMOSTATIC AGENTS (NO CHARGE) OPTIME
TOPICAL | Status: DC | PRN
  Administered 2018-07-13: 1

## 2018-07-13 MED ORDER — CHLORHEXIDINE GLUCONATE CLOTH 2 % EX PADS
6.0000 | MEDICATED_PAD | Freq: Once | CUTANEOUS | Status: AC
  Administered 2018-07-13: 6 via TOPICAL

## 2018-07-13 MED ORDER — SODIUM CHLORIDE 0.9 % IV SOLN
INTRAVENOUS | Status: DC | PRN
  Administered 2018-07-13: 11:00:00 via INTRAVENOUS

## 2018-07-13 MED ORDER — SUCCINYLCHOLINE CHLORIDE 200 MG/10ML IV SOSY
PREFILLED_SYRINGE | INTRAVENOUS | Status: AC
  Filled 2018-07-13: qty 10

## 2018-07-13 MED ORDER — 0.9 % SODIUM CHLORIDE (POUR BTL) OPTIME
TOPICAL | Status: DC | PRN
  Administered 2018-07-13 (×3): 1000 mL

## 2018-07-13 MED ORDER — ROCURONIUM BROMIDE 100 MG/10ML IV SOLN
INTRAVENOUS | Status: DC | PRN
  Administered 2018-07-13: 40 mg via INTRAVENOUS
  Administered 2018-07-13: 20 mg via INTRAVENOUS

## 2018-07-13 MED ORDER — LABETALOL HCL 5 MG/ML IV SOLN: 10.0000 mg | INTRAVENOUS | Status: DC | PRN

## 2018-07-13 MED ORDER — ONDANSETRON HCL 4 MG/2ML IJ SOLN
INTRAMUSCULAR | Status: DC | PRN
  Administered 2018-07-13: 4 mg via INTRAVENOUS

## 2018-07-13 MED ORDER — ACETAMINOPHEN 10 MG/ML IV SOLN: 1000.0000 mg | Freq: Once | INTRAVENOUS | Status: DC | PRN

## 2018-07-13 MED ORDER — EPHEDRINE 5 MG/ML INJ
INTRAVENOUS | Status: AC
  Filled 2018-07-13: qty 10

## 2018-07-13 MED ORDER — ONDANSETRON HCL 4 MG/2ML IJ SOLN: 4.0000 mg | INTRAMUSCULAR | Status: DC | PRN

## 2018-07-13 MED ORDER — THROMBIN 20000 UNITS EX SOLR
CUTANEOUS | Status: AC
  Filled 2018-07-13: qty 20000

## 2018-07-13 MED ORDER — BACITRACIN ZINC 500 UNIT/GM EX OINT
TOPICAL_OINTMENT | CUTANEOUS | Status: AC
  Filled 2018-07-13: qty 28.35

## 2018-07-13 MED ORDER — FENTANYL CITRATE (PF) 100 MCG/2ML IJ SOLN: 25.0000 ug | INTRAMUSCULAR | Status: DC | PRN

## 2018-07-13 MED ORDER — DEXAMETHASONE SODIUM PHOSPHATE 4 MG/ML IJ SOLN
INTRAMUSCULAR | Status: DC | PRN
  Administered 2018-07-13: 4 mg via INTRAVENOUS

## 2018-07-13 MED ORDER — POTASSIUM CHLORIDE IN NACL 20-0.9 MEQ/L-% IV SOLN
INTRAVENOUS | Status: DC
  Administered 2018-07-14 – 2018-07-18 (×7): via INTRAVENOUS
  Filled 2018-07-13 (×8): qty 1000

## 2018-07-13 MED ORDER — PANTOPRAZOLE SODIUM 40 MG IV SOLR
40.0000 mg | Freq: Every day | INTRAVENOUS | Status: DC
  Administered 2018-07-13 – 2018-07-15 (×3): 40 mg via INTRAVENOUS
  Filled 2018-07-13 (×3): qty 40

## 2018-07-13 MED ORDER — MORPHINE SULFATE (PF) 2 MG/ML IV SOLN: 1.0000 mg | INTRAVENOUS | Status: DC | PRN

## 2018-07-13 MED ORDER — THROMBIN 20000 UNITS EX KIT
PACK | CUTANEOUS | Status: AC
  Filled 2018-07-13: qty 1

## 2018-07-13 MED ORDER — ONDANSETRON HCL 4 MG/2ML IJ SOLN
INTRAMUSCULAR | Status: AC
  Filled 2018-07-13: qty 2

## 2018-07-13 MED ORDER — LIDOCAINE-EPINEPHRINE 1 %-1:100000 IJ SOLN
INTRAMUSCULAR | Status: AC
  Filled 2018-07-13: qty 1

## 2018-07-13 MED ORDER — FENTANYL CITRATE (PF) 250 MCG/5ML IJ SOLN
INTRAMUSCULAR | Status: DC | PRN
  Administered 2018-07-13: 100 ug via INTRAVENOUS
  Administered 2018-07-13: 50 ug via INTRAVENOUS

## 2018-07-13 MED ORDER — DEXAMETHASONE SODIUM PHOSPHATE 4 MG/ML IJ SOLN
4.0000 mg | Freq: Three times a day (TID) | INTRAMUSCULAR | Status: DC
  Administered 2018-07-15 – 2018-07-18 (×8): 4 mg via INTRAVENOUS
  Filled 2018-07-13 (×8): qty 1

## 2018-07-13 MED ORDER — DEXAMETHASONE SODIUM PHOSPHATE 10 MG/ML IJ SOLN
10.0000 mg | INTRAMUSCULAR | Status: DC
  Filled 2018-07-13: qty 1

## 2018-07-13 MED ORDER — LIDOCAINE 2% (20 MG/ML) 5 ML SYRINGE
INTRAMUSCULAR | Status: AC
  Filled 2018-07-13: qty 5

## 2018-07-13 MED ORDER — ONDANSETRON HCL 4 MG PO TABS: 4.0000 mg | ORAL_TABLET | ORAL | Status: DC | PRN

## 2018-07-13 MED ORDER — DEXAMETHASONE SODIUM PHOSPHATE 10 MG/ML IJ SOLN
6.0000 mg | Freq: Four times a day (QID) | INTRAMUSCULAR | Status: AC
  Administered 2018-07-13 – 2018-07-14 (×4): 6 mg via INTRAVENOUS
  Filled 2018-07-13 (×4): qty 1

## 2018-07-13 MED ORDER — ROCURONIUM BROMIDE 50 MG/5ML IV SOSY
PREFILLED_SYRINGE | INTRAVENOUS | Status: AC
  Filled 2018-07-13: qty 5

## 2018-07-13 MED ORDER — LEVETIRACETAM IN NACL 500 MG/100ML IV SOLN
500.0000 mg | Freq: Two times a day (BID) | INTRAVENOUS | Status: DC
  Administered 2018-07-13 – 2018-07-19 (×12): 500 mg via INTRAVENOUS
  Filled 2018-07-13 (×12): qty 100

## 2018-07-13 MED ORDER — FENTANYL CITRATE (PF) 250 MCG/5ML IJ SOLN
INTRAMUSCULAR | Status: AC
  Filled 2018-07-13: qty 5

## 2018-07-13 MED ORDER — THROMBIN 5000 UNITS EX SOLR
OROMUCOSAL | Status: DC | PRN
  Administered 2018-07-13: 5 mL via TOPICAL

## 2018-07-13 MED ORDER — THROMBIN 5000 UNITS EX SOLR
CUTANEOUS | Status: AC
  Filled 2018-07-13: qty 5000

## 2018-07-13 MED ORDER — DEXAMETHASONE SODIUM PHOSPHATE 10 MG/ML IJ SOLN
INTRAMUSCULAR | Status: AC
  Filled 2018-07-13: qty 1

## 2018-07-13 MED ORDER — CEFAZOLIN SODIUM-DEXTROSE 1-4 GM/50ML-% IV SOLN
1.0000 g | Freq: Three times a day (TID) | INTRAVENOUS | Status: AC
  Administered 2018-07-13 – 2018-07-14 (×2): 1 g via INTRAVENOUS
  Filled 2018-07-13 (×2): qty 50

## 2018-07-13 MED ORDER — SODIUM CHLORIDE 0.9 % IV SOLN
INTRAVENOUS | Status: DC | PRN
  Administered 2018-07-13: 500 mL

## 2018-07-13 MED ORDER — LABETALOL HCL 5 MG/ML IV SOLN
INTRAVENOUS | Status: AC
  Filled 2018-07-13: qty 4

## 2018-07-13 MED ORDER — HYDRALAZINE HCL 20 MG/ML IJ SOLN
10.0000 mg | INTRAMUSCULAR | Status: DC | PRN
  Administered 2018-07-13 – 2018-07-17 (×8): 10 mg via INTRAVENOUS
  Filled 2018-07-13 (×9): qty 1

## 2018-07-13 MED ORDER — BACITRACIN ZINC 500 UNIT/GM EX OINT
TOPICAL_OINTMENT | CUTANEOUS | Status: DC | PRN
  Administered 2018-07-13: 1 via TOPICAL

## 2018-07-13 MED ORDER — BUPIVACAINE HCL (PF) 0.25 % IJ SOLN
INTRAMUSCULAR | Status: AC
  Filled 2018-07-13: qty 30

## 2018-07-13 MED ORDER — LACTATED RINGERS IV SOLN: INTRAVENOUS | Status: DC | PRN

## 2018-07-13 MED ORDER — LIDOCAINE HCL (CARDIAC) PF 100 MG/5ML IV SOSY
PREFILLED_SYRINGE | INTRAVENOUS | Status: DC | PRN
  Administered 2018-07-13: 100 mg via INTRAVENOUS

## 2018-07-13 MED ORDER — ACETAMINOPHEN 650 MG RE SUPP: 650.0000 mg | RECTAL | Status: DC | PRN

## 2018-07-13 MED ORDER — LABETALOL HCL 5 MG/ML IV SOLN
5.0000 mg | Freq: Once | INTRAVENOUS | Status: AC
  Administered 2018-07-13: 5 mg via INTRAVENOUS

## 2018-07-13 MED ORDER — ACETAMINOPHEN 325 MG PO TABS
650.0000 mg | ORAL_TABLET | ORAL | Status: DC | PRN
  Administered 2018-07-15: 650 mg via ORAL
  Filled 2018-07-13: qty 2

## 2018-07-13 MED ORDER — DEXAMETHASONE SODIUM PHOSPHATE 4 MG/ML IJ SOLN
4.0000 mg | Freq: Four times a day (QID) | INTRAMUSCULAR | Status: AC
  Administered 2018-07-14 – 2018-07-15 (×4): 4 mg via INTRAVENOUS
  Filled 2018-07-13 (×4): qty 1

## 2018-07-13 MED ORDER — SUCCINYLCHOLINE CHLORIDE 20 MG/ML IJ SOLN
INTRAMUSCULAR | Status: DC | PRN
  Administered 2018-07-13: 80 mg via INTRAVENOUS

## 2018-07-13 MED ORDER — SUGAMMADEX SODIUM 200 MG/2ML IV SOLN
INTRAVENOUS | Status: DC | PRN
  Administered 2018-07-13: 160 mg via INTRAVENOUS

## 2018-07-13 MED ORDER — SENNA 8.6 MG PO TABS
1.0000 | ORAL_TABLET | Freq: Two times a day (BID) | ORAL | Status: DC
  Administered 2018-07-13 – 2018-07-19 (×11): 8.6 mg via ORAL
  Filled 2018-07-13 (×11): qty 1

## 2018-07-13 MED ORDER — PROPOFOL 10 MG/ML IV BOLUS
INTRAVENOUS | Status: DC | PRN
  Administered 2018-07-13: 130 mg via INTRAVENOUS
  Administered 2018-07-13: 20 mg via INTRAVENOUS
  Administered 2018-07-13: 10 mg via INTRAVENOUS
  Administered 2018-07-13: 20 mg via INTRAVENOUS

## 2018-07-13 MED ORDER — ESMOLOL HCL 100 MG/10ML IV SOLN
INTRAVENOUS | Status: DC | PRN
  Administered 2018-07-13: 20 mg via INTRAVENOUS

## 2018-07-13 SURGICAL SUPPLY — 63 items
BAG DECANTER FOR FLEXI CONT (MISCELLANEOUS) ×3 IMPLANT
BUR SPIRAL ROUTER 2.3 (BUR) ×1 IMPLANT
BUR SPIRAL ROUTER 2.3MM (BUR)
CANISTER SUCT 3000ML PPV (MISCELLANEOUS) ×5 IMPLANT
CARTRIDGE OIL MAESTRO DRILL (MISCELLANEOUS) ×1 IMPLANT
CLIP VESOCCLUDE MED 6/CT (CLIP) IMPLANT
COVER WAND RF STERILE (DRAPES) ×3 IMPLANT
DIFFUSER DRILL AIR PNEUMATIC (MISCELLANEOUS) ×1 IMPLANT
DRAIN SNY WOU 7FLT (WOUND CARE) ×2 IMPLANT
DRAPE MICROSCOPE LEICA (MISCELLANEOUS) IMPLANT
DRAPE NEUROLOGICAL W/INCISE (DRAPES) ×3 IMPLANT
DRAPE SURG 17X23 STRL (DRAPES) IMPLANT
DRAPE WARM FLUID 44X44 (DRAPE) ×3 IMPLANT
DURAPREP 6ML APPLICATOR 50/CS (WOUND CARE) ×3 IMPLANT
ELECT CAUTERY BLADE 6.4 (BLADE) ×1 IMPLANT
ELECT REM PT RETURN 9FT ADLT (ELECTROSURGICAL) ×3
ELECTRODE REM PT RTRN 9FT ADLT (ELECTROSURGICAL) ×1 IMPLANT
EVACUATOR 1/8 PVC DRAIN (DRAIN) ×2 IMPLANT
EVACUATOR SILICONE 100CC (DRAIN) ×2 IMPLANT
GAUZE 4X4 16PLY RFD (DISPOSABLE) IMPLANT
GAUZE SPONGE 4X4 12PLY STRL (GAUZE/BANDAGES/DRESSINGS) ×1 IMPLANT
GAUZE SPONGE 4X4 12PLY STRL LF (GAUZE/BANDAGES/DRESSINGS) ×2 IMPLANT
GLOVE BIO SURGEON STRL SZ7 (GLOVE) ×2 IMPLANT
GLOVE BIO SURGEON STRL SZ8 (GLOVE) ×3 IMPLANT
GLOVE BIOGEL PI IND STRL 7.0 (GLOVE) IMPLANT
GLOVE BIOGEL PI IND STRL 7.5 (GLOVE) IMPLANT
GLOVE BIOGEL PI INDICATOR 7.0 (GLOVE) ×4
GLOVE BIOGEL PI INDICATOR 7.5 (GLOVE) ×8
GOWN STRL REUS W/ TWL LRG LVL3 (GOWN DISPOSABLE) IMPLANT
GOWN STRL REUS W/ TWL XL LVL3 (GOWN DISPOSABLE) IMPLANT
GOWN STRL REUS W/TWL 2XL LVL3 (GOWN DISPOSABLE) ×1 IMPLANT
GOWN STRL REUS W/TWL LRG LVL3 (GOWN DISPOSABLE) ×3
GOWN STRL REUS W/TWL XL LVL3 (GOWN DISPOSABLE) ×6
HEMOSTAT POWDER KIT SURGIFOAM (HEMOSTASIS) ×2 IMPLANT
KIT BASIN OR (CUSTOM PROCEDURE TRAY) ×3 IMPLANT
KIT TURNOVER KIT B (KITS) ×3 IMPLANT
NEEDLE HYPO 22GX1.5 SAFETY (NEEDLE) ×3 IMPLANT
NS IRRIG 1000ML POUR BTL (IV SOLUTION) ×7 IMPLANT
OIL CARTRIDGE MAESTRO DRILL (MISCELLANEOUS)
PACK CRANIOTOMY CUSTOM (CUSTOM PROCEDURE TRAY) ×3 IMPLANT
PAD ARMBOARD 7.5X6 YLW CONV (MISCELLANEOUS) ×3 IMPLANT
PATTIES SURGICAL .25X.25 (GAUZE/BANDAGES/DRESSINGS) IMPLANT
PATTIES SURGICAL .5 X.5 (GAUZE/BANDAGES/DRESSINGS) IMPLANT
PATTIES SURGICAL .5 X3 (DISPOSABLE) IMPLANT
PATTIES SURGICAL 1X1 (DISPOSABLE) IMPLANT
PERFORATOR LRG  14-11MM (BIT)
PERFORATOR LRG 14-11MM (BIT) ×1 IMPLANT
PIN MAYFIELD SKULL DISP (PIN) ×2 IMPLANT
RUBBERBAND STERILE (MISCELLANEOUS) IMPLANT
SCREW SELF DRILL HT 1.5/4MM (Screw) ×8 IMPLANT
SPONGE NEURO XRAY DETECT 1X3 (DISPOSABLE) IMPLANT
SPONGE SURGIFOAM ABS GEL 100 (HEMOSTASIS) ×3 IMPLANT
STAPLER VISISTAT 35W (STAPLE) ×3 IMPLANT
SUT ETHILON 3 0 FSL (SUTURE) IMPLANT
SUT NURALON 4 0 TR CR/8 (SUTURE) ×4 IMPLANT
SUT VIC AB 2-0 CP2 18 (SUTURE) ×9 IMPLANT
SYR CONTROL 10ML LL (SYRINGE) ×1 IMPLANT
TAPE CLOTH SURG 4X10 WHT LF (GAUZE/BANDAGES/DRESSINGS) ×2 IMPLANT
TOWEL GREEN STERILE (TOWEL DISPOSABLE) ×3 IMPLANT
TOWEL GREEN STERILE FF (TOWEL DISPOSABLE) ×3 IMPLANT
TRAY FOLEY MTR SLVR 16FR STAT (SET/KITS/TRAYS/PACK) IMPLANT
UNDERPAD 30X30 (UNDERPADS AND DIAPERS) IMPLANT
WATER STERILE IRR 1000ML POUR (IV SOLUTION) ×3 IMPLANT

## 2018-07-13 NOTE — Progress Notes (Addendum)
OT Cancellation Note  Patient Details Name: Michael Mercado MRN: [id number] DOB: Jan 11, 1941   Cancelled Treatment:    Reason Eval/Treat Not Completed: Patient at procedure or test/ unavailable (OR for evacuation of SDH); will continue to follow.  Marcy Siren, OT Supplemental Rehabilitation Services Pager [phone] Office [phone]   Orlando Penner 07/13/2018, 10:31 AM

## 2018-07-13 NOTE — Progress Notes (Signed)
Attempted to reach niece but received no answer and was unable to leave a message due to full mailbox.  Dr. Yetta Barre aware.

## 2018-07-13 NOTE — Anesthesia Procedure Notes (Signed)
Procedure Name: Intubation Date/Time: 07/13/2018 11:32 AM Performed by: Margarita Rana, CRNA Pre-anesthesia Checklist: Patient identified, Patient being monitored, Timeout performed, Emergency Drugs available and Suction available Patient Re-evaluated:Patient Re-evaluated prior to induction Oxygen Delivery Method: Circle System Utilized Preoxygenation: Pre-oxygenation with 100% oxygen Induction Type: IV induction Laryngoscope Size: Glidescope and 4 Grade View: Grade I Tube type: Oral Tube size: 7.5 mm Number of attempts: 1 Airway Equipment and Method: Stylet Placement Confirmation: ETT inserted through vocal cords under direct vision,  positive ETCO2 and breath sounds checked- equal and bilateral Secured at: 21 cm Tube secured with: Tape Dental Injury: Teeth and Oropharynx as per pre-operative assessment  Comments: RSI

## 2018-07-13 NOTE — Op Note (Signed)
07/13/2018  12:57 PM  PATIENT:  Michael Mercado  78 y.o. male  PRE-OPERATIVE DIAGNOSIS: Left recurrent subdural hematoma with right hemiparesis  POST-OPERATIVE DIAGNOSIS:  same  PROCEDURE: Redo left craniotomy for evacuation of acute subdural hematoma  SURGEON:  Marikay Alar, MD  ASSISTANTS: Meyran FNP  ANESTHESIA:   General  EBL: 300 ml  Total I/O In: 231.3 [I.V.:131.3; IV Piggyback:100] Out: 675 [Urine:375; Blood:300]  BLOOD ADMINISTERED: none  DRAINS: Medium Hemovac in the subgaleal space and JP drain in the subdural space  SPECIMEN:  none  INDICATION FOR PROCEDURE: This patient presented with recurrent left subdural hematoma seen on follow-up imaging.  He underwent a left craniotomy for subdural hematoma last week and had very friable membranes that we found to be quite vascular and bloody.  We suspected he may have a recurrent hematoma.  Follow-up imaging showed the recurrent hematoma but neurologically he was doing fairly well other than a aphasia.  Over the last day has become more lethargic and is now densely paretic in the right arm with some facial droop.[name] Recommended redo craniotomy for evacuation of recurrent subdural hematoma. Patient understood the risks, benefits, and alternatives and potential outcomes and wished to proceed.  PROCEDURE DETAILS: The patient was taken to the operating room and after induction of adequate generalized endotracheal anesthesia, the head was turned to the right to expose the left frontotemporal parietal region. The head was cleaned with alcohol and then prepped with DuraPrep and draped in the usual sterile fashion. 10 cc of local anesthetic was injected, and the old incision was opened on the left of the head. Raney clips were placed to establish hemostasis of the scalp, the muscle was reflected with the scalp flap, to expose the the old craniotomy flap.  Screws were removed from the plates and the flap was removed.  The flap was then  placed in bacitracin-containing saline solution, and the dura was opened to expose recurrent acute subdural hematoma. A hematoma was then removed with a combination of irrigation and suction. I continued to irrigate until the irrigant was clear to, and dried any bleeding with bipolar cautery.  Again, the hematoma and the membranes were quite vascular and oozed we spent considerable time trying to dry up.  We removed the hematoma that came easily but did not try to suck out all hematoma since it just caused more acute oozing.  I then placed a subdural drain through separate stab incision and close the dura with a running 4-0 Nurolon suture. Dural tack up sutures were placed. The dura was lined with Gelfoam, and the craniotomy flap was replaced with doggie-bone plates. The wound was copiously irrigated. A subgaleal drain was placed, and the galea was then closed with interrupted 2-0 Vicryl suture. The skin was then closed with staples a sterile dressing was applied. The patient was then taken out of the 3-point [place] headrest and awakened from general anesthesia, and transported to the recovery room in guarded condition. At the end of the procedure all sponge, needle, and instrument counts were correct.    PLAN OF CARE: Admit to inpatient   PATIENT DISPOSITION:  PACU - guarded condition.   Delay start of Pharmacological VTE agent (>24hrs) due to surgical blood loss or risk of bleeding:  yes

## 2018-07-13 NOTE — Progress Notes (Signed)
Subjective: Patient still aphasic   Objective: Vital signs in last 24 hours: Temp:  [98.6 F (37 C)-100.8 F (38.2 C)] 99.5 F (37.5 C) (03/25 0800) Pulse Rate:  [47-88] 66 (03/25 0900) Resp:  [10-34] 22 (03/25 0900) BP: (125-163)/(59-103) 145/83 (03/25 0900) SpO2:  [94 %-100 %] 100 % (03/25 0900)  Intake/Output from previous day: 03/24 0701 - 03/25 0700 In: 3036.9 [I.V.:1731.9; NG/GT:1105; IV Piggyback:200] Out: 1850 [Urine:1850] Intake/Output this shift: Total I/O In: 231.3 [I.V.:131.3; IV Piggyback:100] Out: 325 [Urine:325]  Neurologic: Grossly normal, right arm hemiparetic, withdrawals to pain, opens eyes to pain.   Lab Results: Lab Results  Component Value Date   WBC 10.3 07/09/2018   HGB 10.5 (L) 07/09/2018   HCT 32.1 (L) 07/09/2018   MCV 87.7 07/09/2018   PLT 194 07/09/2018   Lab Results  Component Value Date   INR 1.0 07/07/2018   BMET Lab Results  Component Value Date   NA 141 07/09/2018   K 3.8 07/09/2018   CL 113 (H) 07/09/2018   CO2 20 (L) 07/09/2018   GLUCOSE 96 07/09/2018   BUN 17 07/09/2018   CREATININE 1.06 07/09/2018   CALCIUM 8.5 (L) 07/09/2018    Studies/Results: Ct Head Wo Contrast  Result Date: 07/11/2018 CLINICAL DATA:  Followup subdural hematoma. EXAM: CT HEAD WITHOUT CONTRAST TECHNIQUE: Contiguous axial images were obtained from the base of the skull through the vertex without intravenous contrast. COMPARISON:  07/08/2018 FINDINGS: Brain: Left-sided subdural drain has been removed. The overall volume blood and air in the left convexity subdural space is very slightly diminished when measured in multiple locations. No evidence of additional bleeding. Maximal thickness remains approximately 2.4 cm in the frontal region. Mass effect persists, with left-to-right shift measuring up to 14 mm today, increased from maximal measurement of 13 mm previously. There is flattening of the left lateral ventricle. No definite trapping of the right lateral  ventricle. No evidence brain infarction. No new finding. Vascular: There is atherosclerotic calcification of the major vessels at the base of the brain. Skull: Left frontoparietal craniotomy. Sinuses/Orbits: Clear except for mild seasonal mucosal thickening and a few retention cysts. Orbits negative. Other: None IMPRESSION: Left subdural drain has been removed. Persistent subdural blood and air on the left, possibly very mildly diminished. No evidence of worsening or new bleeding. Continued mass effect with left-to-right shift of approximately 14 mm, perhaps a mm increased from the previous study. Maximal thickness of the subdural collection in the frontal region measures 2.4 cm. Electronically Signed   By: Paulina Fusi M.D.   On: 07/11/2018 13:54    Assessment/Plan: Plan to take patient back to OR today for evacuation of SDH.    LOS: 6 days    Tiana Loft [facility] 07/13/2018, 9:58 AM

## 2018-07-13 NOTE — Anesthesia Preprocedure Evaluation (Signed)
Anesthesia Evaluation  Patient identified by MRN, date of birth, ID band Patient confused    Reviewed: Allergy & Precautions, NPO status , Patient's Chart, lab work & pertinent test results, Unable to perform ROS - Chart review only  Airway Mallampati: II  TM Distance: >3 FB Neck ROM: Full    Dental no notable dental hx. (+) Edentulous Upper, Poor Dentition   Pulmonary COPD, Current Smoker,    Pulmonary exam normal breath sounds clear to auscultation       Cardiovascular hypertension, Pt. on medications Normal cardiovascular exam Rhythm:Regular Rate:Normal     Neuro/Psych PSYCHIATRIC DISORDERS Schizophrenia    GI/Hepatic Neg liver ROS,   Endo/Other  negative endocrine ROS  Renal/GU Renal disease     Musculoskeletal   Abdominal   Peds  Hematology   Anesthesia Other Findings   Reproductive/Obstetrics                            Anesthesia Physical Anesthesia Plan  ASA: III  Anesthesia Plan: General   Post-op Pain Management:    Induction: Intravenous  PONV Risk Score and Plan: 3 and Treatment may vary due to age or medical condition, Dexamethasone and Ondansetron  Airway Management Planned: Oral ETT  Additional Equipment: Arterial line  Intra-op Plan:   Post-operative Plan: Extubation in OR  Informed Consent: I have reviewed the patients History and Physical, chart, labs and discussed the procedure including the risks, benefits and alternatives for the proposed anesthesia with the patient or authorized representative who has indicated his/her understanding and acceptance.     Dental advisory given  Plan Discussed with: CRNA  Anesthesia Plan Comments:        Anesthesia Quick Evaluation

## 2018-07-13 NOTE — Transfer of Care (Signed)
Immediate Anesthesia Transfer of Care Note  Patient: Michael Mercado  Procedure(s) Performed: Redo Left CRANIOTOMY FOR SUBDURAL HEMATOMA (Left Head)  Patient Location: PACU  Anesthesia Type:General  Level of Consciousness: awake and responds to stimulation  Airway & Oxygen Therapy: Patient Spontanous Breathing and Patient connected to face mask oxygen  Post-op Assessment: Report given to RN and Post -op Vital signs reviewed and stable  Post vital signs: Reviewed and stable  Last Vitals:  Vitals Value Taken Time  BP    Temp    Pulse    Resp    SpO2      Last Pain:  Vitals:   07/13/18 0800  TempSrc: Axillary         Complications: No apparent anesthesia complications

## 2018-07-13 NOTE — Anesthesia Procedure Notes (Signed)
Arterial Line Insertion Start/End3/25/2020 11:00 AM, 07/13/2018 12:06 PM Performed by: Rosalio Macadamia, CRNA  Preanesthetic checklist: patient identified, IV checked, site marked, risks and benefits discussed, surgical consent, monitors and equipment checked, pre-op evaluation, timeout performed and anesthesia consent Right, radial was placed Catheter size: 20 G Hand hygiene performed  and maximum sterile barriers used  Allen's test indicative of satisfactory collateral circulation Attempts: 1 Procedure performed without using ultrasound guided technique. Following insertion, Biopatch and dressing applied. Post procedure assessment: normal  Patient tolerated the procedure well with no immediate complications.

## 2018-07-13 NOTE — Progress Notes (Signed)
Patient ID: Michael Mercado, male   DOB: 10-29-1940, 78 y.o.   MRN: [id number] Patient has become more difficult to arouse.  He requires more stimulation to open his eyes.  He is not moving his right arm.  His right face is now somewhat drooped.  He moves his right leg fairly well.  I think he is globally a phasic.  I recommended urgent repeat craniotomy for evacuation of the recurrent left subdural hematoma.  We have called his niece twice without answer.  We will assume emergency consent as we did at the time of his admission.

## 2018-07-14 ENCOUNTER — Inpatient Hospital Stay (HOSPITAL_COMMUNITY): Payer: Medicare Other

## 2018-07-14 ENCOUNTER — Encounter (HOSPITAL_COMMUNITY): Payer: Self-pay | Admitting: Neurological Surgery

## 2018-07-14 LAB — GLUCOSE, CAPILLARY
GLUCOSE-CAPILLARY: 118 mg/dL — AB (ref 70–99)
GLUCOSE-CAPILLARY: 147 mg/dL — AB (ref 70–99)
Glucose-Capillary: 115 mg/dL — ABNORMAL HIGH (ref 70–99)
Glucose-Capillary: 128 mg/dL — ABNORMAL HIGH (ref 70–99)
Glucose-Capillary: 130 mg/dL — ABNORMAL HIGH (ref 70–99)
Glucose-Capillary: 133 mg/dL — ABNORMAL HIGH (ref 70–99)

## 2018-07-14 MED ORDER — JEVITY 1.2 CAL PO LIQD
1000.0000 mL | ORAL | Status: DC
  Administered 2018-07-14: 11:00:00
  Administered 2018-07-15: 1000 mL
  Filled 2018-07-14 (×3): qty 1000

## 2018-07-14 NOTE — Progress Notes (Signed)
  Speech Language Pathology Treatment: Dysphagia  Patient Details Name: Michael Mercado MRN: [id number] DOB: [birth year] Today's Date: 07/14/2018 Time: 1552-0802 SLP Time Calculation (min) (ACUTE ONLY): 15 min  Assessment / Plan / Recommendation Clinical Impression  Increased alertness, responding to questions with "yeah" and following simple commands during oral care and po observation. Hard and soft palate pink, top layer tongue peeling, white and lips dry cleaned with toothette and solution. Oral propulsion and manipulation much improved and indications of penetration/aspiration with thin teaspoon water followed by immediate strong reflexive cough. Continue NGT and may be ready for MBS soon. Unable to have FEES due to current restrictions; will see if able to have MBS tomorrow with current cerebral drain in place.   HPI HPI: Pt is a 78 year old male with history of moderate intellectual disablity, tobacco abuse, COPD,  HTN, HLD, CKD stage II who presented to [facility] 07/06/[facility] after fall.  Apparently he slipped and fell getting out of shower but denied LOC.  He was evaluated, asymptomatic, and sent home but then returned on 07/07/18 after additional fall from bed around 0115. At that time he was unresponsive with right facial droop and hypertensive. Code stroke activated. CT head showed large subdural hematoma 19 mm of left-to-right shift.  He was intubated for airway protection and transferred emergently to [facility] for Neurosurgery evaluation. Left craniotomy was completed on 07/07/18 for exacuation and pt was extubated following surgery. Re-do craniotomy 3/25.        SLP Plan  Other (Comment);Continue with current plan of care(possibly MBS next several days)       Recommendations  Diet recommendations: NPO Medication Administration: Via alternative means                Oral Care Recommendations: Oral care QID Follow up Recommendations: Skilled Nursing facility SLP  Visit Diagnosis: Dysphagia, unspecified (R13.10) Plan: Other (Comment);Continue with current plan of care(possibly MBS next several days)       GO                [name] 07/14/2018, 11:31 AM   [name].Ed Nurse, children's [phone] Office [phone]

## 2018-07-14 NOTE — Progress Notes (Signed)
Physical Therapy Treatment Patient Details Name: Michael Mercado MRN: [id number] DOB: 06-20-1940 Today's Date: 07/14/2018    History of Present Illness 78 year old Caucasian male with intellectual disability, anxiety and schizophrenia presented with AMS/unresponsive and R sided neurologic deficits.  Found to have a large L SDH with midline shift. s/p L crani for evacuation of subdural hematoma. Recurrent Left SDH with additional crani 3/25    PT Comments    Pt with eyes open tracking on arrival. Pt able to respond with yes no answers accurately today with some delay. Pt able to participate in transfers and progress to gait today with significant improvement in alertness, function and mobility. Pt at this time appropriate for CIR given rapid change in function. Encouraged mobility with nursing. Pt happily watching cartoons end of session. He stated "yes" to walker with RW at baseline.     Follow Up Recommendations  CIR;Supervision/Assistance - 24 hour     Equipment Recommendations  Rolling walker with 5" wheels    Recommendations for Other Services       Precautions / Restrictions Precautions Precautions: Fall Precaution Comments: cortrak, hemovac and jp drain, art line    Mobility  Bed Mobility Overal bed mobility: Needs Assistance Bed Mobility: Rolling;Sidelying to Sit Rolling: Mod assist Sidelying to sit: Mod assist       General bed mobility comments: multimodal cues to bend knees with assist to bend right knee, assist to reach and roll as well as bring legs off bed and elevate trunk  Transfers Overall transfer level: Needs assistance   Transfers: Sit to/from Stand Sit to Stand: Min assist;+2 safety/equipment Stand pivot transfers: Mod assist       General transfer comment: pt able to stand from bed and chair with cues for sequence and min assist to rise. Pt pivoted bed to recliner with RW with stepping. Initial mod assist for sitting balance with progression to  minguard  Ambulation/Gait Ambulation/Gait assistance: Mod assist;+2 safety/equipment Gait Distance (Feet): 30 Feet Assistive device: Rolling walker (2 wheeled) Gait Pattern/deviations: Step-through pattern;Decreased stride length;Trunk flexed;Narrow base of support   Gait velocity interpretation: <1.8 ft/sec, indicate of risk for recurrent falls General Gait Details: pt with right lean, difficulty stepping with left foot with exagerrated step, keeping Rw too close on right and physical assist to maintain balance and control RW with close chair follow   Stairs             Wheelchair Mobility    Modified Rankin (Stroke Patients Only) Modified Rankin (Stroke Patients Only) Pre-Morbid Rankin Score: No significant disability Modified Rankin: Moderately severe disability     Balance Overall balance assessment: Needs assistance Sitting-balance support: Feet supported;No upper extremity supported Sitting balance-Leahy Scale: Poor Sitting balance - Comments: mod assist with progression to minguard EOB grossly 7 min     Standing balance-Leahy Scale: Poor Standing balance comment: bil UE supported in standing                            Cognition Arousal/Alertness: Awake/alert Behavior During Therapy: Flat affect Overall Cognitive Status: Impaired/Different from baseline Area of Impairment: Following commands;Memory;Orientation                 Orientation Level: Disoriented to;Time;Situation;Place Current Attention Level: Sustained   Following Commands: Follows one step commands inconsistently;Follows one step commands with increased time Safety/Judgement: Decreased awareness of deficits;Decreased awareness of safety   Problem Solving: Slow processing;Decreased initiation;Requires verbal cues;Requires tactile  cues General Comments: pt following one step commands today with increased time. able to verbalize with yes and no responses accurately.        Exercises      General Comments        Pertinent Vitals/Pain Pain Assessment: No/denies pain    Home Living                      Prior Function            PT Goals (current goals can now be found in the care plan section) Acute Rehab PT Goals Time For Goal Achievement: 07/28/18 Progress towards PT goals: Goals met and updated - see care plan    Frequency           PT Plan Discharge plan needs to be updated    Co-evaluation              AM-PAC PT "6 Clicks" Mobility   Outcome Measure  Help needed turning from your back to your side while in a flat bed without using bedrails?: A Lot Help needed moving from lying on your back to sitting on the side of a flat bed without using bedrails?: A Lot Help needed moving to and from a bed to a chair (including a wheelchair)?: A Lot Help needed standing up from a chair using your arms (e.g., wheelchair or bedside chair)?: A Little Help needed to walk in hospital room?: A Lot Help needed climbing 3-5 steps with a railing? : Total 6 Click Score: 12    End of Session Equipment Utilized During Treatment: Gait belt Activity Tolerance: Patient tolerated treatment well Patient left: in chair;with call bell/phone within reach;with chair alarm set Nurse Communication: Mobility status;Precautions PT Visit Diagnosis: Unsteadiness on feet (R26.81);Other abnormalities of gait and mobility (R26.89);Other symptoms and signs involving the nervous system (R29.898)     Time: 6294-7654 PT Time Calculation (min) (ACUTE ONLY): 29 min  Charges:  $Gait Training: 8-22 mins $Therapeutic Activity: 8-22 mins                     [place], PT Acute Rehabilitation Services Pager: [phone] Office: [place] 07/14/2018, 9:43 AM

## 2018-07-14 NOTE — Progress Notes (Signed)
Patient ID: Michael Mercado, male   DOB: 1940/07/13, 77 y.o.   MRN: [id number] Doing much better, [id number], moves R arm against gravity, follows commands at times, says simple words, regards me, dressing dry, CT head looks good.

## 2018-07-14 NOTE — Procedures (Signed)
Inserted small bore feeding tube via two-step x-ray protocol. Position confirmed in stomach.  Lynnell Catalan, MD [facility] ICU Physician [facility]  Pager: [phone] Mobile: [phone] After hours: [phone].  07/14/2018, 3:26 PM

## 2018-07-15 ENCOUNTER — Inpatient Hospital Stay (HOSPITAL_COMMUNITY): Payer: Medicare Other

## 2018-07-15 LAB — GLUCOSE, CAPILLARY
Glucose-Capillary: 152 mg/dL — ABNORMAL HIGH (ref 70–99)
Glucose-Capillary: 145 mg/dL — ABNORMAL HIGH (ref 70–99)
Glucose-Capillary: 138 mg/dL — ABNORMAL HIGH (ref 70–99)
Glucose-Capillary: 165 mg/dL — ABNORMAL HIGH (ref 70–99)
Glucose-Capillary: 154 mg/dL — ABNORMAL HIGH (ref 70–99)
Glucose-Capillary: 132 mg/dL — ABNORMAL HIGH (ref 70–99)

## 2018-07-15 MED ORDER — JEVITY 1.5 CAL/FIBER PO LIQD
1000.0000 mL | ORAL | Status: DC
  Administered 2018-07-15 – 2018-07-17 (×3): 1000 mL
  Filled 2018-07-15 (×7): qty 1000

## 2018-07-15 MED ORDER — RESOURCE THICKENUP CLEAR PO POWD
ORAL | Status: DC | PRN
  Filled 2018-07-15: qty 125

## 2018-07-15 MED ORDER — PRO-STAT SUGAR FREE PO LIQD
30.0000 mL | Freq: Two times a day (BID) | ORAL | Status: DC
  Administered 2018-07-15 – 2018-07-18 (×6): 30 mL
  Filled 2018-07-15 (×6): qty 30

## 2018-07-15 NOTE — Progress Notes (Signed)
  Speech Language Pathology Treatment:    Patient Details Name: Michael Mercado MRN: [id number] DOB: 01/26/1941 Today's Date: 07/15/2018 Time:  -     MBS planned for today at 11:00   GO                [name] 07/15/2018, 9:00 AM   [name].Ed Nurse, children's [phone] Office [phone]

## 2018-07-15 NOTE — Progress Notes (Signed)
I came to the patient's room to evaluate him.  He is in speech therapy getting a swallowing evaluation.

## 2018-07-15 NOTE — Progress Notes (Signed)
Physical Therapy Treatment Patient Details Name: Michael Mercado MRN: [id number] DOB: [birth year] Today's Date: 07/15/2018    History of Present Illness 78 year old Caucasian male with intellectual disability, anxiety and schizophrenia presented with AMS/unresponsive and R sided neurologic deficits.  Found to have a large L SDH with midline shift. s/p L crani for evacuation of subdural hematoma. Recurrent Left SDH with additional crani 3/25    PT Comments    Pt moving well with more verbalizations and increased gait tolerance. Pt continues to have decreased strength RUE and RLE with right lean in standing but moving all extremities. Pt very pleasant and offers LUE for mitten to be replaced end of session. Pt remains appropriate for increased activity with nursing and CIR.   Follow Up Recommendations  CIR;Supervision/Assistance - 24 hour     Equipment Recommendations  Rolling walker with 5" wheels    Recommendations for Other Services       Precautions / Restrictions Precautions Precautions: Fall Precaution Comments: cortrak, hemovac and jp drain    Mobility  Bed Mobility Overal bed mobility: Needs Assistance Bed Mobility: Supine to Sit     Supine to sit: Min assist;HOB elevated     General bed mobility comments: HOB 20 degrees with min assist to fully elevate trunk from surface and bring legs fully to EOB  Transfers Overall transfer level: Needs assistance   Transfers: Sit to/from Stand Sit to Stand: Min assist;+2 safety/equipment         General transfer comment: pt able to stand from bed with multimodal cues to initiate and min assist to rise  Ambulation/Gait Ambulation/Gait assistance: Mod assist;+2 safety/equipment Gait Distance (Feet): 80 Feet Assistive device: Rolling walker (2 wheeled) Gait Pattern/deviations: Step-through pattern;Decreased stride length;Trunk flexed;Narrow base of support   Gait velocity interpretation: <1.8 ft/sec, indicate of risk  for recurrent falls General Gait Details: pt with right lean, difficulty stepping with left foot with exagerrated step, keeping Rw too close on right and physical assist to maintain balance and control RW with close chair follow   Stairs             Wheelchair Mobility    Modified Rankin (Stroke Patients Only) Modified Rankin (Stroke Patients Only) Pre-Morbid Rankin Score: No significant disability Modified Rankin: Moderately severe disability     Balance Overall balance assessment: Needs assistance Sitting-balance support: Feet supported;No upper extremity supported Sitting balance-Leahy Scale: Fair Sitting balance - Comments: pt min assist initial sitting with minguard after only 20 sec today grossly 5 min EOB     Standing balance-Leahy Scale: Poor Standing balance comment: bil UE supported in standing                            Cognition Arousal/Alertness: Awake/alert Behavior During Therapy: Flat affect Overall Cognitive Status: No family/caregiver present to determine baseline cognitive functioning Area of Impairment: Following commands;Memory;Orientation                 Orientation Level: Disoriented to;Time;Situation;Place Current Attention Level: Sustained   Following Commands: Follows one step commands inconsistently;Follows one step commands with increased time Safety/Judgement: Decreased awareness of deficits;Decreased awareness of safety   Problem Solving: Slow processing;Decreased initiation;Requires verbal cues;Requires tactile cues General Comments: pt following one step commands today with increased time. able to verbalize with yes and no responses accurately and stating "morning"      Exercises      General Comments  Pertinent Vitals/Pain Pain Assessment: No/denies pain    Home Living                      Prior Function            PT Goals (current goals can now be found in the care plan section)  Progress towards PT goals: Progressing toward goals    Frequency           PT Plan Current plan remains appropriate    Co-evaluation              AM-PAC PT "6 Clicks" Mobility   Outcome Measure  Help needed turning from your back to your side while in a flat bed without using bedrails?: A Lot Help needed moving from lying on your back to sitting on the side of a flat bed without using bedrails?: A Lot Help needed moving to and from a bed to a chair (including a wheelchair)?: A Lot Help needed standing up from a chair using your arms (e.g., wheelchair or bedside chair)?: A Little Help needed to walk in hospital room?: A Lot Help needed climbing 3-5 steps with a railing? : Total 6 Click Score: 12    End of Session Equipment Utilized During Treatment: Gait belt Activity Tolerance: Patient tolerated treatment well Patient left: in chair;with call bell/phone within reach;with chair alarm set Nurse Communication: Mobility status;Precautions PT Visit Diagnosis: Unsteadiness on feet (R26.81);Other abnormalities of gait and mobility (R26.89);Other symptoms and signs involving the nervous system (H68.088)     Time: 1103-1594 PT Time Calculation (min) (ACUTE ONLY): 26 min  Charges:  $Gait Training: 8-22 mins $Therapeutic Activity: 8-22 mins                     Larhonda Dettloff Abner Greenspan, PT Acute Rehabilitation Services Pager: [phone] Office: [phone]    Enedina Finner Dyneisha Murchison 07/15/2018, 9:54 AM

## 2018-07-15 NOTE — Progress Notes (Signed)
Occupational Therapy Treatment Patient Details Name: Michael Mercado MRN: [id number] DOB: 1941-02-09 Today's Date: 07/15/2018    History of present illness 78 year old Caucasian male with intellectual disability, anxiety and schizophrenia presented with AMS/unresponsive and R sided neurologic deficits.  Found to have a large L SDH with midline shift. s/p L crani for evacuation of subdural hematoma. Recurrent Left SDH with additional crani 3/25   OT comments  This 78 yo male admitted and underwent above presents to acute OT with eyes fully open today and following ~50% o one step commands and 50% of questions when given 2 choices and increased time. He will continue to benefit from acute OT with follow up OT now recommended CIR.  Follow Up Recommendations  CIR;Supervision/Assistance - 24 hour    Equipment Recommendations  Other (comment)(TBD next venue)       Precautions / Restrictions Precautions Precautions: Fall Precaution Comments: cortrak, hemovac and jp drain Restrictions Weight Bearing Restrictions: No              ADL either performed or assessed with clinical judgement   ADL Overall ADL's : Needs assistance/impaired     Grooming: Wash/dry face;Minimal assistance;Bed level Grooming Details (indicate cue type and reason): using LUE--VCs to wash left side of face                                     Vision Baseline Vision/History: Wears glasses Wears Glasses: At all times Additional Comments: Full eye opening today. Left eye to far left and to unable to track in any direction with left eye. Pt able to grossly track in all directions with right eye. Pt reports with increased time that he does not see double when asked. He was able to reach for object in multiple directions but did not always directly hit it.          Cognition Arousal/Alertness: Awake/alert Behavior During Therapy: Flat affect Overall Cognitive Status: No family/caregiver present  to determine baseline cognitive functioning Area of Impairment: Attention;Following commands;Safety/judgement;Awareness;Problem solving                   Current Attention Level: Sustained   Following Commands: Follows one step commands inconsistently;Follows one step commands with increased time Safety/Judgement: Decreased awareness of deficits;Decreased awareness of safety Awareness: Intellectual Problem Solving: Slow processing;Decreased initiation;Requires verbal cues;Requires tactile cues General Comments: Able with increased time to give me answers when given two choices 50% of time.        Exercises Other Exercises Other Exercises: Had pt grab flash light from me with left UE and then asked him to turn it on. He tried to reach over and left RUE A  with RUE moving minimally.When asking pt to try and move RUE, no movement noted            Pertinent Vitals/ Pain       Pain Assessment: No/denies pain         Frequency  Min 3X/week        Progress Toward Goals  OT Goals(current goals can now be found in the care plan section)  Progress towards OT goals: Progressing toward goals     Plan Discharge plan needs to be updated       AM-PAC OT "6 Clicks" Daily Activity     Outcome Measure   Help from another person eating meals?: Total Help from another person taking  care of personal grooming?: A Lot Help from another person toileting, which includes using toliet, bedpan, or urinal?: Total Help from another person bathing (including washing, rinsing, drying)?: A Lot Help from another person to put on and taking off regular upper body clothing?: Total Help from another person to put on and taking off regular lower body clothing?: Total 6 Click Score: 8    End of Session    OT Visit Diagnosis: Muscle weakness (generalized) (M62.81);Feeding difficulties (R63.3);Cognitive communication deficit (R41.841);Hemiplegia and hemiparesis;Other abnormalities of gait and  mobility (R26.89) Symptoms and signs involving cognitive functions: Cerebral infarction Hemiplegia - Right/Left: Right Hemiplegia - dominant/non-dominant: Dominant Hemiplegia - caused by: Cerebral infarction   Activity Tolerance Patient tolerated treatment well   Patient Left in bed;with call bell/phone within reach;with restraints reapplied(left hand mitt)           Time: 5465-6812 OT Time Calculation (min): 17 min  Charges: OT General Charges $OT Visit: 1 Visit OT Treatments $Self Care/Home Management : 8-22 mins  [name], OTR/L Acute Rehab Services Pager [phone] Office [phone]      [name] 07/15/2018, 4:54 PM

## 2018-07-15 NOTE — Progress Notes (Signed)
Modified Barium Swallow Progress Note  Patient Details  Name: Michael Mercado MRN: [id number] Date of Birth: 10-18-1940  Today's Date: 07/15/2018  Modified Barium Swallow completed.  Full report located under Chart Review in the Imaging Section.  Brief recommendations include the following:  Clinical Impression  Pt exhibits an acute on (suspected) chronic dysphagia from sensorimotor deficits and anatomical differences leading to reduced initiation, contraction and laryngeal closure. Inconsistent initiation of swallow at the pyriform sinuses and silent (trace-mild) penetration (PAS 2,3) before complete laryngeal closure was achieved with thin. Pt unable to produce effective volitional cough or throat clear. Mild vallecular residue originating from lingual residue and decreased pharyngeal contraction consistent throughout study. Cervical vertebrae has a kyphotic appearance in addition to large/fused bony protrusion in lower cervical esophagus near cricopharyngeus possibly affecting complete UES function. Pt noted to belch/slightly gag early in the study and esophageal scanned revealed copious stasis in upper esophagus that was reduced but not cleared given additional time to transit. Thin liquids are safer for esophageal phase of deglutition however aspiration risk with thin is high if precautions not followed/no supervision and large sips consumed. To reintroduce po's, recommend initiaton of puree texture (no dentition) and nectar thick liquids with trials of thin using strategies with ST only. Pt should remain upright 30 min after meals, crush meds, cup sips preferred, small sips and cues to cough/throat clear if pt able.        Swallow Evaluation Recommendations       SLP Diet Recommendations: Dysphagia 1 (Puree) solids;Nectar thick liquid   Liquid Administration via: Cup   Medication Administration: Crushed with puree   Supervision: Staff to assist with self feeding;Full  supervision/cueing for compensatory strategies   Compensations: Minimize environmental distractions;Slow rate;Small sips/bites;Clear throat intermittently;Hard cough after swallow   Postural Changes: Remain semi-upright after after feeds/meals (Comment);Seated upright at 90 degrees   Oral Care Recommendations: Oral care BID        [name] 07/15/2018,2:44 PM   [name] Fonda.Ed Nurse, children's [phone] Office [phone]

## 2018-07-15 NOTE — Progress Notes (Signed)
Nutrition Follow-up RD working remotely.  DOCUMENTATION CODES:   Not applicable  INTERVENTION:   Change TF to nocturnal via Cortrak   Jevity 1.5 @ 75 ml/hr x 14 hours  Run from 1700-0700  Provides: 1775 kcal, 96 grams protein, and 798 ml free water.   Encourage PO intake at meals, will supplement as appropriate.   NUTRITION DIAGNOSIS:   Inadequate oral intake related to dysphagia as evidenced by meal completion < 50%. Ongoing.   GOAL:   Patient will meet greater than or equal to 90% of their needs Met with TF.   MONITOR:   PO intake, TF tolerance  REASON FOR ASSESSMENT:   Consult Enteral/tube feeding initiation and management  ASSESSMENT:   78 y/o male pmhx mental retardation, HTN. Resident of a group home. Presents to ED after falling and striking head. Developed R side weakness. CT head showed L SDH w/ mass effect and midline shift s/p emergent L sided craniotomy 3/19.   3/21 Cortrak placed 3/26 Cortrak replaced by MD with two step xray 3/27 SLP able to start PO diet after MBS, pt now on Dysphagia 1 with Nectar thick liquids  Therapies recommend CIR consult  Labs and meds reviewed Pt is + 12 L   TF: Jevity 1.2 @ 65 ml/hr: 1872 kcal, 87 grams protein, and 1259 ml free water.   Diet Order:   Diet Order            DIET - DYS 1 Room service appropriate? Yes; Fluid consistency: Nectar Thick  Diet effective now              EDUCATION NEEDS:   Not appropriate for education at this time  Skin:  Skin Assessment: Skin Integrity Issues: Skin Integrity Issues:: Incisions Incisions: Surgical incision-L skull  Last BM:  3/22  Height:   Ht Readings from Last 1 Encounters:  07/07/18 5' 6.25" (1.683 m)    Weight:   Wt Readings from Last 1 Encounters:  07/10/18 73.4 kg    Ideal Body Weight:  65.2 kg  BMI:  Body mass index is 25.92 kg/m.  Estimated Nutritional Needs:   Kcal:  1700-1950 kcals (23-25 kcal/kg bw)  Protein:  85-100g Pro (~20%  energy needs)  Fluid:  >1.9 L (25 ml/kg bw)   Maylon Peppers RD, LDN, CNSC [phone] Pager [phone] After Hours Pager

## 2018-07-15 NOTE — Progress Notes (Signed)
Cortrak Team Note  Cortrak placed yesterday via 2 step X-ray protocol by R. Agarwala, MD. Cathlean Sauer was obtained which confirmed tube position in the stomach.  RD placed bridle at bedside. Secured Cortrak in right nare at 78 cm. Bridle pick placed in labeled specimen bag at head of bed. RN aware.   Earma Reading, MS, RD, LDN Inpatient Clinical Dietitian Pager: [phone] Weekend/After Hours: [phone]

## 2018-07-16 LAB — GLUCOSE, CAPILLARY
Glucose-Capillary: 207 mg/dL — ABNORMAL HIGH (ref 70–99)
Glucose-Capillary: 161 mg/dL — ABNORMAL HIGH (ref 70–99)
Glucose-Capillary: 149 mg/dL — ABNORMAL HIGH (ref 70–99)
Glucose-Capillary: 112 mg/dL — ABNORMAL HIGH (ref 70–99)
Glucose-Capillary: 201 mg/dL — ABNORMAL HIGH (ref 70–99)

## 2018-07-16 MED ORDER — PANTOPRAZOLE SODIUM 40 MG PO TBEC
40.0000 mg | DELAYED_RELEASE_TABLET | Freq: Every day | ORAL | Status: DC
  Administered 2018-07-16: 40 mg via ORAL
  Filled 2018-07-16: qty 1

## 2018-07-16 NOTE — Progress Notes (Signed)
Subjective: The patient is alert and pleasant.  He is in no apparent distress.  Objective: Vital signs in last 24 hours: Temp:  [98.1 F (36.7 C)-98.8 F (37.1 C)] 98.7 F (37.1 C) (03/28 0800) Pulse Rate:  [45-76] 76 (03/28 1000) Resp:  [14-24] 22 (03/28 1000) BP: (109-177)/(61-94) 140/61 (03/28 1000) SpO2:  [92 %-100 %] 92 % (03/28 1000) Estimated body mass index is 25.92 kg/m as calculated from the following:   Height as of this encounter: 5' 6.25" (1.683 m).   Weight as of this encounter: 73.4 kg.   Intake/Output from previous day: 03/27 0701 - 03/28 0700 In: 2798.7 [I.V.:1698.7; NG/GT:900; IV Piggyback:200] Out: 2262 [Urine:2255; Drains:7] Intake/Output this shift: Total I/O In: 305.9 [I.V.:205.9; IV Piggyback:100] Out: 500 [Urine:500]  Physical exam the patient is alert and pleasant.  He follows commands.  He answers simple questions.  He is moving all 4 extremities.  Lab Results: No results for input(s): WBC, HGB, HCT, PLT in the last 72 hours. BMET No results for input(s): NA, K, CL, CO2, GLUCOSE, BUN, CREATININE, CALCIUM in the last 72 hours.  Studies/Results: Dg Chest Port 1 View  Result Date: 07/14/2018 CLINICAL DATA:  Check gastric catheter placement EXAM: PORTABLE CHEST 1 VIEW COMPARISON:  07/08/2018 FINDINGS: Cardiac shadow is stable. The lungs are well aerated bilaterally without focal infiltrate. Feeding catheter is noted with the tip in the mid to distal esophagus. This should be advanced several cm. IMPRESSION: Feeding catheter within the mid to distal esophagus. Electronically Signed   By: Alcide Clever M.D.   On: 07/14/2018 15:36   Dg Abd Portable 1v  Result Date: 07/14/2018 CLINICAL DATA:  Check gastric catheter placement EXAM: PORTABLE ABDOMEN - 1 VIEW COMPARISON:  Chest x-ray from earlier in the same day. FINDINGS: Scattered large and small bowel gas is noted. Feeding catheter is noted coiled within the stomach advanced when compared with the prior  exam. IMPRESSION: Feeding catheter coiled within the stomach. Electronically Signed   By: Alcide Clever M.D.   On: 07/14/2018 15:37   Dg Swallowing Func-speech Pathology  Result Date: 07/15/2018 Objective Swallowing Evaluation: Type of Study: MBS-Modified Barium Swallow Study  Patient Details Name: Michael Mercado MRN: [id number] Date of Birth: 07-26-1940 Today's Date: 07/15/2018 Time: SLP Start Time (ACUTE ONLY): 1105 -SLP Stop Time (ACUTE ONLY): 1125 SLP Time Calculation (min) (ACUTE ONLY): 20 min Past Medical History: Past Medical History: Diagnosis Date . Hypercholesteremia  . Hypertension  . Moderate intellectual disability  . Mood disorder (HCC)  . MR (mental retardation)  . Obesity  Past Surgical History: Past Surgical History: Procedure Laterality Date . CRANIOTOMY Left 07/07/2018  Procedure: CRANIOTOMY HEMATOMA EVACUATION SUBDURAL;  Surgeon: Tia Alert, MD;  Location: [facility] OR;  Service: Neurosurgery;  Laterality: Left; . CRANIOTOMY Left 07/13/2018  Procedure: Redo Left CRANIOTOMY FOR SUBDURAL HEMATOMA;  Surgeon: Tia Alert, MD;  Location: [facility] OR;  Service: Neurosurgery;  Laterality: Left; HPI: Pt is a 78 year old male with history of moderate intellectual disablity, tobacco abuse, COPD,  HTN, HLD, CKD stage II who presented to [facility] 07/06/18 after fall.  Apparently he slipped and fell getting out of shower but denied LOC.  He was evaluated, asymptomatic, and sent home but then returned on 07/07/18 after additional fall from bed around 0115. At that time he was unresponsive with right facial droop and hypertensive. Code stroke activated. CT head showed large subdural hematoma 19 mm of left-to-right shift.  He was intubated for airway protection  and transferred emergently to [facility] for Neurosurgery evaluation. Left craniotomy was completed on 07/07/18 for exacuation and pt was extubated following surgery. Re-do craniotomy 3/25.   No data recorded Assessment / Plan / Recommendation CHL IP  CLINICAL IMPRESSIONS 07/15/2018 Clinical Impression Pt exhibits an acute on (suspected) chronic dysphagia from sensorimotor deficits and anatomical differences leading to reduced initiation, contraction and laryngeal closure. Inconsistent initiation of swallow at the pyriform sinuses and silent (trace-mild) penetration (PAS 2,3) before complete laryngeal closure was achieved with thin. Pt unable to produce effective volitional cough or throat clear. Mild vallecular residue originating from lingual residue and decreased pharyngeal contraction consistent throughout study. Cervical vertebrae has a kyphotic appearance in addition to large/fused bony protrusion in lower cervical esophagus near cricopharyngeus possibly affecting complete UES function. Pt noted to belch/slightly gag early in the study and esophageal scanned revealed copious stasis in upper esophagus that was reduced but not cleared given additional time to transit. Thin liquids are safer for esophageal phase of deglutition however aspiration risk with thin is high if precautions not followed/no supervision and large sips consumed. To reintroduce po's, recommend initiaton of puree texture (no dentition) and nectar thick liquids with trials of thin using strategies with ST only. Pt should remain upright 30 min after meals, crush meds, cup sips preferred, small sips and cues to cough/throat clear if pt able.      SLP Visit Diagnosis Dysphagia, oropharyngeal phase (R13.12) Attention and concentration deficit following -- Frontal lobe and executive function deficit following -- Impact on safety and function Moderate aspiration risk   CHL IP TREATMENT RECOMMENDATION 07/15/2018 Treatment Recommendations Therapy as outlined in treatment plan below   Prognosis 07/15/2018 Prognosis for Safe Diet Advancement (No Data) Barriers to Reach Goals Cognitive deficits;Other (Comment) Barriers/Prognosis Comment -- CHL IP DIET RECOMMENDATION 07/15/2018 SLP Diet Recommendations  Dysphagia 1 (Puree) solids;Nectar thick liquid Liquid Administration via Cup Medication Administration Crushed with puree Compensations Minimize environmental distractions;Slow rate;Small sips/bites;Clear throat intermittently;Hard cough after swallow Postural Changes Remain semi-upright after after feeds/meals (Comment);Seated upright at 90 degrees   CHL IP OTHER RECOMMENDATIONS 07/15/2018 Recommended Consults -- Oral Care Recommendations Oral care BID Other Recommendations --   CHL IP FOLLOW UP RECOMMENDATIONS 07/15/2018 Follow up Recommendations Skilled Nursing facility   [facility] IP FREQUENCY AND DURATION 07/15/2018 Speech Therapy Frequency (ACUTE ONLY) min 2x/week Treatment Duration 2 weeks      CHL IP ORAL PHASE 07/15/2018 Oral Phase Impaired Oral - Pudding Teaspoon -- Oral - Pudding Cup -- Oral - Honey Teaspoon -- Oral - Honey Cup -- Oral - Nectar Teaspoon Lingual/palatal residue Oral - Nectar Cup Lingual/palatal residue Oral - Nectar Straw Lingual/palatal residue Oral - Thin Teaspoon WFL Oral - Thin Cup Lingual/palatal residue Oral - Thin Straw -- Oral - Puree Lingual/palatal residue Oral - Mech Soft -- Oral - Regular -- Oral - Multi-Consistency -- Oral - Pill -- Oral Phase - Comment --  CHL IP PHARYNGEAL PHASE 07/15/2018 Pharyngeal Phase Impaired Pharyngeal- Pudding Teaspoon -- Pharyngeal -- Pharyngeal- Pudding Cup -- Pharyngeal -- Pharyngeal- Honey Teaspoon -- Pharyngeal -- Pharyngeal- Honey Cup -- Pharyngeal -- Pharyngeal- Nectar Teaspoon Delayed swallow initiation-pyriform sinuses;Pharyngeal residue - valleculae Pharyngeal -- Pharyngeal- Nectar Cup Pharyngeal residue - valleculae;Delayed swallow initiation-pyriform sinuses Pharyngeal -- Pharyngeal- Nectar Straw Pharyngeal residue - valleculae Pharyngeal -- Pharyngeal- Thin Teaspoon Penetration/Aspiration during swallow;Penetration/Aspiration before swallow Pharyngeal Material enters airway, remains ABOVE vocal cords then ejected out;Material enters airway, remains  ABOVE vocal cords and not ejected out Pharyngeal- Thin Cup -- Pharyngeal --  Pharyngeal- Thin Straw -- Pharyngeal -- Pharyngeal- Puree Pharyngeal residue - valleculae Pharyngeal -- Pharyngeal- Mechanical Soft -- Pharyngeal -- Pharyngeal- Regular -- Pharyngeal -- Pharyngeal- Multi-consistency -- Pharyngeal -- Pharyngeal- Pill -- Pharyngeal -- Pharyngeal Comment --  CHL IP CERVICAL ESOPHAGEAL PHASE 07/15/2018 Cervical Esophageal Phase (No Data) Pudding Teaspoon -- Pudding Cup -- Honey Teaspoon -- Honey Cup -- Nectar Teaspoon -- Nectar Cup -- Nectar Straw -- Thin Teaspoon -- Thin Cup -- Thin Straw -- Puree -- Mechanical Soft -- Regular -- Multi-consistency -- Pill -- Cervical Esophageal Comment -- [name] 07/15/2018, 2:44 PM [name] Litaker [employer] Sports administrator Pager [phone] Office [phone]               Assessment/Plan: Subdural hematoma: I will plan to remove his drains either today or tomorrow.  LOS: 9 days     Cristi Loron 07/16/2018, 10:26 AM

## 2018-07-16 NOTE — Progress Notes (Signed)
  Speech Language Pathology Treatment: Dysphagia  Patient Details Name: Michael Mercado MRN: [id number] DOB: 07-04-1940 Today's Date: 07/16/2018 Time: 4098-1191 SLP Time Calculation (min) (ACUTE ONLY): 21 min  Assessment / Plan / Recommendation Clinical Impression  Patient seen for skilled ST treatment targeting dysphagia. MBS completed yesterday, showing acute on chronic oropharyngeal dysphagia; copious stasis observed in upper esophagus. Pt able to self-feed via cup, straw with moderate assistance; he had difficulty with cup placement to lips d/t cortrak in place. No overt s/sx aspiration with nectar, although pt did belch occasionally. While consuming puree texture, pt with more frequent belching, gagging x1, followed by multiple swallows; suspect due to reflux or esophageal stasis. Pt unable to initiate a volitional cough/throat clear despite cues. Would continue dys 1, nectar for now; use esophageal precautions: preferably out of bed for meals, upright 30 minutes after eating, slow rate, small bites and sips, and feed smaller amounts more frequently. D/w RN. SLP will continue to follow.   HPI HPI: Pt is a 78 year old male with history of moderate intellectual disablity, tobacco abuse, COPD,  HTN, HLD, CKD stage II who presented to [facility] 07/06/18 after fall.  Apparently he slipped and fell getting out of shower but denied LOC.  He was evaluated, asymptomatic, and sent home but then returned on 07/07/18 after additional fall from bed around 0115. At that time he was unresponsive with right facial droop and hypertensive. Code stroke activated. CT head showed large subdural hematoma 19 mm of left-to-right shift.  He was intubated for airway protection and transferred emergently to [facility] for Neurosurgery evaluation. Left craniotomy was completed on 07/07/18 for exacuation and pt was extubated following surgery. Re-do craniotomy 3/25.        SLP Plan  Continue with current plan of  care       Recommendations  Diet recommendations: Dysphagia 1 (puree);Nectar-thick liquid Liquids provided via: Cup;Straw Medication Administration: Via alternative means Supervision: Full supervision/cueing for compensatory strategies Compensations: Minimize environmental distractions;Slow rate;Small sips/bites;Clear throat intermittently;Hard cough after swallow Postural Changes and/or Swallow Maneuvers: Seated upright 90 degrees;Upright 30-60 min after meal                Oral Care Recommendations: Oral care QID Follow up Recommendations: Skilled Nursing facility SLP Visit Diagnosis: Dysphagia, oropharyngeal phase (R13.12) Plan: Continue with current plan of care       GO              Michael Baton, MS, CCC-SLP Speech-Language Pathologist Acute Rehabilitation Services Pager: [phone] Office: [phone]   Michael Mercado 07/16/2018, 10:46 AM

## 2018-07-16 NOTE — Progress Notes (Signed)
Physical Therapy Treatment Patient Details Name: Michael Mercado MRN: [id number] DOB: 1941-03-22 Today's Date: 07/16/2018    History of Present Illness 78 year old Caucasian male with intellectual disability, anxiety and schizophrenia presented with AMS/unresponsive and R sided neurologic deficits.  Found to have a large L SDH with midline shift. s/p L crani for evacuation of subdural hematoma. Recurrent Left SDH with additional crani 3/25    PT Comments    Pt was eager to participate.  While sitting and prepping for out of chair activity, worked on attending to the right and hand over hand use of the R UE.  Emphasis once up was on trying to improve advancement of the right LE and general gait stability/quality.    Follow Up Recommendations  CIR;Supervision/Assistance - 24 hour     Equipment Recommendations  Other (comment)(TBD)    Recommendations for Other Services       Precautions / Restrictions Precautions Precautions: Fall Precaution Comments: cortrak, hemovac and jp drain    Mobility  Bed Mobility               General bed mobility comments: up in the chair on arrival  Transfers Overall transfer level: Needs assistance   Transfers: Sit to/from Stand Sit to Stand: Min assist;+2 safety/equipment         General transfer comment: actual assist with min assist, to work on quality of movement takes moderate cuing and hand over had assist  Ambulation/Gait Ambulation/Gait assistance: Mod assist;+2 safety/equipment Gait Distance (Feet): 110 Feet Assistive device: Rolling walker (2 wheeled) Gait Pattern/deviations: Step-through pattern;Decreased stride length;Trunk flexed;Narrow base of support   Gait velocity interpretation: <1.8 ft/sec, indicate of risk for recurrent falls General Gait Details: min stability assist, but mod assist to work on improving gait pattern and get assist from R UE.   Stairs             Wheelchair Mobility    Modified  Rankin (Stroke Patients Only) Modified Rankin (Stroke Patients Only) Pre-Morbid Rankin Score: No significant disability Modified Rankin: Moderately severe disability     Balance Overall balance assessment: Needs assistance   Sitting balance-Leahy Scale: Fair       Standing balance-Leahy Scale: Poor Standing balance comment: reliant on AD or external assist                            Cognition Arousal/Alertness: Awake/alert Behavior During Therapy: Flat affect Overall Cognitive Status: (NT formally, inattentive to R side and slow to process)                                        Exercises      General Comments        Pertinent Vitals/Pain Pain Assessment: No/denies pain Pain Intervention(s): Monitored during session    Home Living                      Prior Function            PT Goals (current goals can now be found in the care plan section) Acute Rehab PT Goals Patient Stated Goal: Unable to state PT Goal Formulation: Patient unable to participate in goal setting Time For Goal Achievement: 07/28/18 Potential to Achieve Goals: Fair Progress towards PT goals: Progressing toward goals    Frequency    Min 3X/week  PT Plan Current plan remains appropriate    Co-evaluation              AM-PAC PT "6 Clicks" Mobility   Outcome Measure  Help needed turning from your back to your side while in a flat bed without using bedrails?: A Lot Help needed moving from lying on your back to sitting on the side of a flat bed without using bedrails?: A Lot Help needed moving to and from a bed to a chair (including a wheelchair)?: A Lot Help needed standing up from a chair using your arms (e.g., wheelchair or bedside chair)?: A Little Help needed to walk in hospital room?: A Little Help needed climbing 3-5 steps with a railing? : A Lot 6 Click Score: 14    End of Session   Activity Tolerance: Patient tolerated treatment  well Patient left: in chair;with call bell/phone within reach;with chair alarm set Nurse Communication: Mobility status PT Visit Diagnosis: Unsteadiness on feet (R26.81);Other abnormalities of gait and mobility (R26.89);Other symptoms and signs involving the nervous system (R29.898)     Time: 0511-0211 PT Time Calculation (min) (ACUTE ONLY): 23 min  Charges:  $Gait Training: 8-22 mins $Therapeutic Activity: 8-22 mins                     07/16/2018  Michael Mercado, PT Acute Rehabilitation Services [phone]  (pager) [phone]  (office)   Michael Mercado Michael Mercado 07/16/2018, 3:44 PM

## 2018-07-16 NOTE — Progress Notes (Signed)
Orthopedic Tech Progress Note Patient Details:  Michael Mercado [birth year] [id number] Called in order to [facility] Patient ID: [name], male   DOB: 08-Jan-1941, 78 y.o.   MRN: [id number]   Donald Pore 07/16/2018, 8:31 AM

## 2018-07-16 NOTE — Progress Notes (Signed)
OT Contact Note  Checking patient's room for right resting hand splint, which have not been delivered to room. Called and talked with Ortho Tech who gladly called [facility] to discuss splint. [facility] reporting they will deliver resting hand splint on Monday.  Notified RN and left sign in room with splint instructions/wear schedule for when right resting hand splint arrives. Will check back on Monday. Thank you.  Numa Schroeter MSOT, OTR/L Acute Rehab Pager: [phone] Office: [phone]

## 2018-07-17 LAB — GLUCOSE, CAPILLARY
Glucose-Capillary: 127 mg/dL — ABNORMAL HIGH (ref 70–99)
Glucose-Capillary: 129 mg/dL — ABNORMAL HIGH (ref 70–99)
Glucose-Capillary: 131 mg/dL — ABNORMAL HIGH (ref 70–99)
Glucose-Capillary: 134 mg/dL — ABNORMAL HIGH (ref 70–99)
Glucose-Capillary: 157 mg/dL — ABNORMAL HIGH (ref 70–99)

## 2018-07-17 MED ORDER — PANTOPRAZOLE SODIUM 40 MG PO PACK
40.0000 mg | PACK | Freq: Every day | ORAL | Status: DC
  Administered 2018-07-17 – 2018-07-18 (×2): 40 mg
  Filled 2018-07-17 (×3): qty 20

## 2018-07-17 NOTE — Progress Notes (Signed)
Physical Therapy Treatment Patient Details Name: Michael Mercado MRN: [id number] DOB: Dec 31, 1940 Today's Date: 07/17/2018    History of Present Illness 78 year old Caucasian male with intellectual disability, anxiety and schizophrenia presented with AMS/unresponsive and R sided neurologic deficits.  Found to have a large L SDH with midline shift. s/p L crani for evacuation of subdural hematoma. Recurrent Left SDH with additional crani 3/25    PT Comments    Pt pleasant alert sitting in chair. Pt willing to get up and walk this session and asking for Coca cola and cigarettes end of session. Pt states he smoke a pack a day. Pt also able to state he likes [employer] music and turned on you tube music for him end of session. Pt with improved gait tolerance and stride this session with RW movement initiated and maintained by therapist. Pt continues to have right lean but greatly decreased from prior sessions. D/C plan remains appropriate, will continue to follow.     Follow Up Recommendations  CIR;Supervision/Assistance - 24 hour     Equipment Recommendations  Rolling walker with 5" wheels    Recommendations for Other Services       Precautions / Restrictions Precautions Precautions: Fall Precaution Comments: cortrak    Mobility  Bed Mobility               General bed mobility comments: up in the chair on arrival  Transfers Overall transfer level: Needs assistance   Transfers: Sit to/from Stand Sit to Stand: Min assist         General transfer comment: min assist to rise with cues for hand placement and safety  Ambulation/Gait Ambulation/Gait assistance: Mod assist;+2 safety/equipment Gait Distance (Feet): 200 Feet Assistive device: Rolling walker (2 wheeled) Gait Pattern/deviations: Step-through pattern;Decreased stride length;Trunk flexed;Narrow base of support;Decreased stance time - left   Gait velocity interpretation: <1.8 ft/sec, indicate of risk for  recurrent falls General Gait Details: pt with improved gait tolerance and step length if RW movement initiated and maintained for him. PT with decreased right lean today and continues to have short high step on LLE with periods of partial scissoring. IV pole pulled in front of him with cues for attention to direction and forward movement. If RW not moved for pt he is heavy mod assist to try to get him stepping and walking   Stairs             Wheelchair Mobility    Modified Rankin (Stroke Patients Only) Modified Rankin (Stroke Patients Only) Pre-Morbid Rankin Score: No significant disability Modified Rankin: Moderately severe disability     Balance Overall balance assessment: Needs assistance Sitting-balance support: Feet supported;No upper extremity supported Sitting balance-Leahy Scale: Fair     Standing balance support: Single extremity supported Standing balance-Leahy Scale: Poor Standing balance comment: reliant on AD or external assist                            Cognition Arousal/Alertness: Awake/alert Behavior During Therapy: Flat affect   Area of Impairment: Attention;Following commands;Safety/judgement;Awareness;Problem solving                   Current Attention Level: Sustained   Following Commands: Follows one step commands inconsistently;Follows one step commands with increased time Safety/Judgement: Decreased awareness of deficits;Decreased awareness of safety   Problem Solving: Slow processing;Decreased initiation;Requires verbal cues;Requires tactile cues General Comments: pt more responsive with stating he wants Coca cola and  cigarettes       Exercises      General Comments        Pertinent Vitals/Pain Pain Assessment: No/denies pain    Home Living                      Prior Function            PT Goals (current goals can now be found in the care plan section) Progress towards PT goals: Progressing toward  goals    Frequency           PT Plan Current plan remains appropriate    Co-evaluation              AM-PAC PT "6 Clicks" Mobility   Outcome Measure  Help needed turning from your back to your side while in a flat bed without using bedrails?: A Little Help needed moving from lying on your back to sitting on the side of a flat bed without using bedrails?: A Little Help needed moving to and from a bed to a chair (including a wheelchair)?: A Little Help needed standing up from a chair using your arms (e.g., wheelchair or bedside chair)?: A Little Help needed to walk in hospital room?: A Lot Help needed climbing 3-5 steps with a railing? : A Lot 6 Click Score: 16    End of Session Equipment Utilized During Treatment: Gait belt Activity Tolerance: Patient tolerated treatment well Patient left: in chair;with call bell/phone within reach;with chair alarm set Nurse Communication: Mobility status PT Visit Diagnosis: Unsteadiness on feet (R26.81);Other abnormalities of gait and mobility (R26.89);Other symptoms and signs involving the nervous system (R29.898)     Time: 5102-5852 PT Time Calculation (min) (ACUTE ONLY): 25 min  Charges:  $Gait Training: 8-22 mins $Therapeutic Activity: 8-22 mins                     Dionysios Massman Abner Greenspan, PT Acute Rehabilitation Services Pager: [phone] Office: [phone]    Enedina Finner Tariq Pernell 07/17/2018, 12:50 PM

## 2018-07-17 NOTE — Progress Notes (Signed)
PT Cancellation Note  Patient Details Name: Michael Mercado MRN: [id number] DOB: 1940-12-21   Cancelled Treatment:    Reason Eval/Treat Not Completed: Fatigue/lethargy limiting ability to participate(pt fatigued barely able to keep his eyes open after eating and bathing. Will reattempt later)   Enedina Finner Jorgen Wolfinger 07/17/2018, 9:06 AM  Delaney Meigs, PT Acute Rehabilitation Services Pager: [phone] Office: [phone]

## 2018-07-17 NOTE — Anesthesia Postprocedure Evaluation (Signed)
Anesthesia Post Note  Patient: Michael Mercado  Procedure(s) Performed: Redo Left CRANIOTOMY FOR SUBDURAL HEMATOMA (Left Head)     Patient location during evaluation: SICU Anesthesia Type: General Level of consciousness: sedated Pain management: pain level controlled Vital Signs Assessment: post-procedure vital signs reviewed and stable Respiratory status: patient remains intubated per anesthesia plan Cardiovascular status: stable Postop Assessment: no apparent nausea or vomiting Anesthetic complications: no    Last Vitals:  Vitals:   07/17/18 0400 07/17/18 0500  BP: (!) 153/85 (!) 168/85  Pulse: (!) 59 (!) 52  Resp: 19 17  Temp: 36.6 C   SpO2: 99% 97%    Last Pain:  Vitals:   07/17/18 0400  TempSrc: Axillary  PainSc:    Pain Goal:                   Trevor Iha

## 2018-07-17 NOTE — Progress Notes (Signed)
Subjective: The patient is alert and pleasant.  He is in no apparent distress.  Objective: Vital signs in last 24 hours: Temp:  [97.8 F (36.6 C)-98.9 F (37.2 C)] 98.2 F (36.8 C) (03/29 0800) Pulse Rate:  [50-87] 69 (03/29 0800) Resp:  [11-29] 18 (03/29 0800) BP: (122-177)/(58-117) 145/66 (03/29 0800) SpO2:  [91 %-99 %] 96 % (03/29 0800) Estimated body mass index is 25.92 kg/m as calculated from the following:   Height as of this encounter: 5' 6.25" (1.683 m).   Weight as of this encounter: 73.4 kg.   Intake/Output from previous day: 03/28 0701 - 03/29 0700 In: 2782.2 [I.V.:1757.2; NG/GT:825; IV Piggyback:200] Out: 1975 [Urine:1975] Intake/Output this shift: Total I/O In: 75 [I.V.:75] Out: 375 [Urine:375]  Physical exam the patient is alert and pleasant.  He answer simple questions.  He is moving all 4 extremities.  His wound is healing well.  Lab Results: No results for input(s): WBC, HGB, HCT, PLT in the last 72 hours. BMET No results for input(s): NA, K, CL, CO2, GLUCOSE, BUN, CREATININE, CALCIUM in the last 72 hours.  Studies/Results: Dg Swallowing Func-speech Pathology  Result Date: 07/15/2018 Objective Swallowing Evaluation: Type of Study: MBS-Modified Barium Swallow Study  Patient Details Name: GRASON DRAWHORN MRN: [id number] Date of Birth: 10/29/1940 Today's Date: 07/15/2018 Time: SLP Start Time (ACUTE ONLY): 1105 -SLP Stop Time (ACUTE ONLY): 1125 SLP Time Calculation (min) (ACUTE ONLY): 20 min Past Medical History: Past Medical History: Diagnosis Date . Hypercholesteremia  . Hypertension  . Moderate intellectual disability  . Mood disorder (HCC)  . MR (mental retardation)  . Obesity  Past Surgical History: Past Surgical History: Procedure Laterality Date . CRANIOTOMY Left 07/07/2018  Procedure: CRANIOTOMY HEMATOMA EVACUATION SUBDURAL;  Surgeon: Tia Alert, MD;  Location: [facility] OR;  Service: Neurosurgery;  Laterality: Left; . CRANIOTOMY Left 07/13/2018  Procedure: Redo  Left CRANIOTOMY FOR SUBDURAL HEMATOMA;  Surgeon: Tia Alert, MD;  Location: [facility] OR;  Service: Neurosurgery;  Laterality: Left; HPI: Pt is a 78 year old male with history of moderate intellectual disablity, tobacco abuse, COPD,  HTN, HLD, CKD stage II who presented to [facility] 07/06/18 after fall.  Apparently he slipped and fell getting out of shower but denied LOC.  He was evaluated, asymptomatic, and sent home but then returned on 07/07/18 after additional fall from bed around 0115. At that time he was unresponsive with right facial droop and hypertensive. Code stroke activated. CT head showed large subdural hematoma 19 mm of left-to-right shift.  He was intubated for airway protection and transferred emergently to [facility] for Neurosurgery evaluation. Left craniotomy was completed on 07/07/18 for exacuation and pt was extubated following surgery. Re-do craniotomy 3/25.   No data recorded Assessment / Plan / Recommendation CHL IP CLINICAL IMPRESSIONS 07/15/2018 Clinical Impression Pt exhibits an acute on (suspected) chronic dysphagia from sensorimotor deficits and anatomical differences leading to reduced initiation, contraction and laryngeal closure. Inconsistent initiation of swallow at the pyriform sinuses and silent (trace-mild) penetration (PAS 2,3) before complete laryngeal closure was achieved with thin. Pt unable to produce effective volitional cough or throat clear. Mild vallecular residue originating from lingual residue and decreased pharyngeal contraction consistent throughout study. Cervical vertebrae has a kyphotic appearance in addition to large/fused bony protrusion in lower cervical esophagus near cricopharyngeus possibly affecting complete UES function. Pt noted to belch/slightly gag early in the study and esophageal scanned revealed copious stasis in upper esophagus that was reduced but not cleared given additional time  to transit. Thin liquids are safer for esophageal phase of  deglutition however aspiration risk with thin is high if precautions not followed/no supervision and large sips consumed. To reintroduce po's, recommend initiaton of puree texture (no dentition) and nectar thick liquids with trials of thin using strategies with ST only. Pt should remain upright 30 min after meals, crush meds, cup sips preferred, small sips and cues to cough/throat clear if pt able.      SLP Visit Diagnosis Dysphagia, oropharyngeal phase (R13.12) Attention and concentration deficit following -- Frontal lobe and executive function deficit following -- Impact on safety and function Moderate aspiration risk   CHL IP TREATMENT RECOMMENDATION 07/15/2018 Treatment Recommendations Therapy as outlined in treatment plan below   Prognosis 07/15/2018 Prognosis for Safe Diet Advancement (No Data) Barriers to Reach Goals Cognitive deficits;Other (Comment) Barriers/Prognosis Comment -- CHL IP DIET RECOMMENDATION 07/15/2018 SLP Diet Recommendations Dysphagia 1 (Puree) solids;Nectar thick liquid Liquid Administration via Cup Medication Administration Crushed with puree Compensations Minimize environmental distractions;Slow rate;Small sips/bites;Clear throat intermittently;Hard cough after swallow Postural Changes Remain semi-upright after after feeds/meals (Comment);Seated upright at 90 degrees   CHL IP OTHER RECOMMENDATIONS 07/15/2018 Recommended Consults -- Oral Care Recommendations Oral care BID Other Recommendations --   CHL IP FOLLOW UP RECOMMENDATIONS 07/15/2018 Follow up Recommendations Skilled Nursing facility   [facility] IP FREQUENCY AND DURATION 07/15/2018 Speech Therapy Frequency (ACUTE ONLY) min 2x/week Treatment Duration 2 weeks      CHL IP ORAL PHASE 07/15/2018 Oral Phase Impaired Oral - Pudding Teaspoon -- Oral - Pudding Cup -- Oral - Honey Teaspoon -- Oral - Honey Cup -- Oral - Nectar Teaspoon Lingual/palatal residue Oral - Nectar Cup Lingual/palatal residue Oral - Nectar Straw Lingual/palatal residue Oral - Thin  Teaspoon WFL Oral - Thin Cup Lingual/palatal residue Oral - Thin Straw -- Oral - Puree Lingual/palatal residue Oral - Mech Soft -- Oral - Regular -- Oral - Multi-Consistency -- Oral - Pill -- Oral Phase - Comment --  CHL IP PHARYNGEAL PHASE 07/15/2018 Pharyngeal Phase Impaired Pharyngeal- Pudding Teaspoon -- Pharyngeal -- Pharyngeal- Pudding Cup -- Pharyngeal -- Pharyngeal- Honey Teaspoon -- Pharyngeal -- Pharyngeal- Honey Cup -- Pharyngeal -- Pharyngeal- Nectar Teaspoon Delayed swallow initiation-pyriform sinuses;Pharyngeal residue - valleculae Pharyngeal -- Pharyngeal- Nectar Cup Pharyngeal residue - valleculae;Delayed swallow initiation-pyriform sinuses Pharyngeal -- Pharyngeal- Nectar Straw Pharyngeal residue - valleculae Pharyngeal -- Pharyngeal- Thin Teaspoon Penetration/Aspiration during swallow;Penetration/Aspiration before swallow Pharyngeal Material enters airway, remains ABOVE vocal cords then ejected out;Material enters airway, remains ABOVE vocal cords and not ejected out Pharyngeal- Thin Cup -- Pharyngeal -- Pharyngeal- Thin Straw -- Pharyngeal -- Pharyngeal- Puree Pharyngeal residue - valleculae Pharyngeal -- Pharyngeal- Mechanical Soft -- Pharyngeal -- Pharyngeal- Regular -- Pharyngeal -- Pharyngeal- Multi-consistency -- Pharyngeal -- Pharyngeal- Pill -- Pharyngeal -- Pharyngeal Comment --  CHL IP CERVICAL ESOPHAGEAL PHASE 07/15/2018 Cervical Esophageal Phase (No Data) Pudding Teaspoon -- Pudding Cup -- Honey Teaspoon -- Honey Cup -- Nectar Teaspoon -- Nectar Cup -- Nectar Straw -- Thin Teaspoon -- Thin Cup -- Thin Straw -- Puree -- Mechanical Soft -- Regular -- Multi-consistency -- Pill -- Cervical Esophageal Comment -- [name] 07/15/2018, 2:44 PM [name] [employer] administrator Pager [phone] Office [phone]               Assessment/Plan: Status post craniotomies for subdural hematoma: The patient is clinically stable and improving.  LOS: 10 days      Cristi Loron 07/17/2018, 9:48 AM

## 2018-07-18 LAB — GLUCOSE, CAPILLARY
Glucose-Capillary: 145 mg/dL — ABNORMAL HIGH (ref 70–99)
Glucose-Capillary: 126 mg/dL — ABNORMAL HIGH (ref 70–99)

## 2018-07-18 MED ORDER — ORAL CARE MOUTH RINSE
15.0000 mL | Freq: Two times a day (BID) | OROMUCOSAL | Status: DC
  Administered 2018-07-18 (×2): 15 mL via OROMUCOSAL

## 2018-07-18 MED ORDER — POTASSIUM CHLORIDE IN NACL 20-0.9 MEQ/L-% IV SOLN
INTRAVENOUS | Status: DC
  Administered 2018-07-18 (×2): via INTRAVENOUS
  Filled 2018-07-18: qty 1000

## 2018-07-18 MED ORDER — CHLORHEXIDINE GLUCONATE 0.12 % MT SOLN
15.0000 mL | Freq: Two times a day (BID) | OROMUCOSAL | Status: DC
  Administered 2018-07-18 – 2018-07-19 (×3): 15 mL via OROMUCOSAL
  Filled 2018-07-18 (×2): qty 15

## 2018-07-18 NOTE — Progress Notes (Signed)
Patient ID: Michael Mercado, male   DOB: Apr 02, 1941, 78 y.o.   MRN: [id number] looks good, walking, some talking, FC, to floor today, incision CDI

## 2018-07-18 NOTE — Progress Notes (Signed)
Entered patient room at 1700.  Patient had removed NG tube and condom catheter.  Dietician has DC'd tube feedings.  Patient taking PO well, ate entire dinner without difficulty.  Leo Grosser notified.

## 2018-07-18 NOTE — H&P (Signed)
Physical Medicine and Rehabilitation Admission H&P    Chief Complaint  Patient presents with   Code Stroke   Chief complaint: headache  HPI: Michael Mercado is a [age] year old right-handed male with history of hypertension on Norvasc 10 mg daily, hyperlipidemia, mild mental retardation with anxiety/schizophrenia maintained on Klonopin 0.5 mg daily as needed, Risperdal 1 mg twice a day as well as Paxil 20 mg daily at bedtime and also history of tobacco abuse. Per report patient lives at [facility] where he has resided at friends number of years. He was able to manage basic ADLs prior to admission and was also doing some simple volunteer work. Presented 07/07/2018 after a fall getting out of the shower. Per report patient did strike his head but did not lose consciousness. Cranial CT scan showed a large mixed attenuation left whole low hemispheric subdural hematoma measuring 2.8 cm in maximal diameter with associated extensive mass effect and left-to-right midline shift. Underwent left craniotomy for evacuation of subdural hematoma 07/07/2018 per Dr. Marikay Alaravid Jones. He was extubated 07/07/2018 and maintained on Keppra for seizure prophylaxis. On 07/13/2018 patient more difficult to arouse was not moving his right arm as well as some increased facial weakness. Follow-up neurosurgery felt need for reexploration and underwent redo left craniotomy for evacuation of acute subdural hematoma 07/13/2018. Patient did require nasogastric tube for nutritional support. His diet has been advanced to dysphagia #1 thin liquid. Follow-up cranial CT scan 07/14/2018 showed decreased subdural blood on the left with subdural drain. Since been removed. Decreased mass effect and midline shift now 6 mm as compared to 13 mm on prior tracings. Patient's level of alertness has continued to improve. Therapy evaluations ongoing with recommendations of physical medicine rehabilitation consult. Patient was admitted for a  compress of rehabilitation program.  Review of Systems  Unable to perform ROS: Mental acuity   Past Medical History:  Diagnosis Date   Hypercholesteremia    Hypertension    Moderate intellectual disability    Mood disorder (HCC)    MR (mental retardation)    Obesity    Past Surgical History:  Procedure Laterality Date   CRANIOTOMY Left 07/07/2018   Procedure: CRANIOTOMY HEMATOMA EVACUATION SUBDURAL;  Surgeon: Tia AlertJones, David S, MD;  [facility];  Service: Neurosurgery;  Laterality: Left;   CRANIOTOMY Left 07/13/2018   Procedure: Redo Left CRANIOTOMY FOR SUBDURAL HEMATOMA;  Surgeon: Tia AlertJones, David S, MD;  [facility];  Service: Neurosurgery;  Laterality: Left;   Family History  Problem Relation Age of Onset   Stroke Sister    Social History:  reports that he has been smoking cigarettes. He has been smoking about 0.33 packs per day. He has never used smokeless tobacco. He reports that he does not drink alcohol or use drugs. Allergies: No Known Allergies Medications Prior to Admission  Medication Sig Dispense Refill   amLODipine (NORVASC) 10 MG tablet Take 1 tablet (10 mg total) by mouth daily. 90 tablet 3   aspirin 81 MG EC tablet TAKE 1 TABLET BY MOUTH ONCE DAILY. **DO NOT CRUSH** (Patient taking differently: Take 81 mg by mouth daily. ) 30 tablet 11   BREO ELLIPTA 100-25 MCG/INH AEPB INHALE 1 PUFF ONCE DAILY. (Patient taking differently: Inhale 1 puff into the lungs daily. ) 60 each 5   clonazePAM (KLONOPIN) 0.5 MG tablet Take 0.5 mg by mouth daily as needed for anxiety.      COMPLETE ALLERGY MEDICINE 25 MG capsule TAKE 1 CAPSULE BY MOUTH DAILY  AS NEEDED FOR ALLERGY SYMPTOMS. CONTACTNURSE IF SYMPTOMS WORSEN. (Patient taking differently: Take 25 mg by mouth daily as needed for allergies. ) 30 capsule PRN   Emollient (MINERIN) LOTN APPLY TWICE DAILY 473 mL 3   hydrocortisone cream 1 % APPLY TO THE AFFECTED AREA(s) THREE TIMES DAILY. (Patient taking differently: Apply  1 application topically 3 (three) times daily. ) 28.35 g 2   loperamide (IMODIUM) 2 MG capsule TAKE 2 CAPSULES BY MOUTH AS NEEDED AFTER 2ND LOOSE STOOL REPEAT AFTERNEXT LOOSE STOOL MAX 3 DOSES, NOTIFY NURSE. (Patient taking differently: Take 4 mg by mouth as needed for diarrhea or loose stools. ) 30 capsule 5   MAPAP 500 MG tablet TAKE 2 TABS BY MOUTH EVERY 4 HRS AS NEEDED FOR HEADACHE/MILD TO MODERATE PAIN OR TEMP OF 100.3 & ABOVE IF TEMP UNRESOLVED AFTER 24 HRS MUST (Patient taking differently: Take 1,000 mg by mouth every 4 (four) hours as needed for moderate pain or headache. ) 30 tablet 1   Neomycin-Bacitracin-Polymyxin (TRIPLE ANTIBIOTIC) [phone] OINT MINOR CUT/ABRASION:CLEAN W/ MILD SOAP/WATER THEN APPLY THIN LAYER TO AFFECTED AREA MAY COVER W/ BANDAID REMOVE AFTER 24 HRS NOTIFY NURSE OF (Patient taking differently: Apply 1 application topically as needed (cuts or abrasion). ) 28.4 g 2   PARoxetine (PAXIL) 20 MG tablet Take 20 mg by mouth at bedtime.      Polyethylene Glycol (LIP BALM BASE EX) Apply topically.     QC ANTACID/ANTI-GAS 200-200-20 MG/5ML suspension TAKE AS NEEDED FOR UPSET STOMACH. NO MORE THAN 3 DOSES IN 24 HOURS. NOTIFY NURSE IF SYMPTOMS PERSIST. (Patient taking differently: Take 10 mLs by mouth as needed for indigestion. ) 355 mL 5   QC MILK OF MAGNESIA 400 MG/5ML suspension TAKE (2 TBLS)AS NEEDED AFTER 3 DAYS NO BM MAY REPEAT X3 DOSES IFNO RESOLUTION CALL NURSE (CONSTIPATION) (Patient taking differently: Take 30 mLs by mouth as needed for mild constipation. ) 355 mL 3   risperiDONE (RISPERDAL) 1 MG tablet Take 1 mg by mouth 2 (two) times daily.     Sodium Bicarbonate (AFTER BITE EX) Apply topically as needed.     Sunscreens (COPPERTONE SPORT SPF30 EX) Apply topically.     TUSSIN 100 MG/5ML syrup TAKE (2TSP) BY MOUTH EVERY 6 HOURS AS NEEDED FOR COUGH/COLD MAX4 DOSES IN 24 HRS, NOTIFY NURSE IF PERSISTS. (Patient taking differently: Take 200 mg by  mouth every 6 (six) hours as needed for cough. ) 118 mL 2   VENTOLIN HFA 108 (90 Base) MCG/ACT inhaler INHALE (2) PUFFS BY MOUTH 4 TIMES DAILY AS NEEDED FOR SHORTNESS OF BREATH. (Patient taking differently: Inhale 2 puffs into the lungs every 4 (four) hours as needed for wheezing or shortness of breath. ) 18 g 5   Emollient (LUBRICATING LOTION EX) Apply topically 2 (two) times daily.     ibuprofen (ADVIL,MOTRIN) 400 MG tablet Take 400 mg by mouth every 8 (eight) hours as needed.     loperamide (IMODIUM) 2 MG capsule Take by mouth as needed for diarrhea or loose stools.     loperamide (IMODIUM) 2 MG capsule TAKE 2 CAPSULES BY MOUTH AS NEEDED AFTER 2ND LOOSE STOOL. TAKE 1 CAPSULE AFTER NEXT LOOSE STOOL MAX 3 DOSES, NOTIFY NURSE. (Patient not taking: Reported on 07/07/2018) 30 capsule PRN    Drug Regimen Review Drug regimen was reviewed and remains appropriate with no significant issues identified  Home: Home Living Family/patient expects to be discharged to:: Group home Additional Comments: Not a lot of infomation  about group home available at time of writing this note   Functional History: Prior Function Comments: Not a lot of information available re: PLOF at time of writing this note, except that normally Rumeal is social and talkative; It is likely he was walking independently, and managing his basic ADLs in the group home setting  Functional Status:  Mobility: Bed Mobility Overal bed mobility: Needs Assistance Bed Mobility: Supine to Sit Rolling: Mod assist Sidelying to sit: Mod assist Supine to sit: Supervision General bed mobility comments: pt with supervision for lines and safety, with cues to initiate pt able to roll to side and rise without sequential cues or assist Transfers Overall transfer level: Needs assistance Equipment used: 2 person hand held assist Transfers: Sit to/from Stand Sit to Stand: Min guard Stand pivot transfers: Mod assist General transfer comment:  cues for hand placement and safety with only close guarding and cues needed to stand from bed and toilet this session.  Ambulation/Gait Ambulation/Gait assistance: Mod assist, +2 safety/equipment Gait Distance (Feet): 300 Feet Assistive device: Rolling walker (2 wheeled) Gait Pattern/deviations: Step-through pattern, Decreased stride length, Trunk flexed, Narrow base of support, Decreased stance time - left General Gait Details: Pt with improved balance, gait and function during activity today. Pt with decreased right lean and able to maintain self closer to in midline in walker. He continues to be easily distracted and with distraction increased LOB with mod assist to recover. Decreased attention to right with max cues to attend to right side and safety of environment. I Gait velocity interpretation: 1.31 - 2.62 ft/sec, indicative of limited community ambulator    ADL: ADL Overall ADL's : Needs assistance/impaired Eating/Feeding: NPO Grooming: Wash/dry face, Minimal assistance, Bed level Grooming Details (indicate cue type and reason): using LUE--VCs to wash left side of face Upper Body Bathing: Total assistance, Sitting Lower Body Bathing: Total assistance, Sit to/from stand Upper Body Dressing : Total assistance, Sitting Lower Body Dressing: Total assistance, Sit to/from stand Toilet Transfer: Total assistance, +2 for physical assistance, Stand-pivot Toilet Transfer Details (indicate cue type and reason): use of bed pad and gait belt; bed to recliner going to pt's left Toileting- Clothing Manipulation and Hygiene: Total assistance, Sit to/from stand Functional mobility during ADLs: Maximal assistance, Moderate assistance, +2 for physical assistance, +2 for safety/equipment General ADL Comments: Pt total A for all basic ADLs. Attempted hand over hand with pt washing his face with LUE while seated EOB, but pt guarding my A to help him. (earlier while supine in bed pt reached up with LUE to  wipe at nose--so the movement is there)  Cognition: Cognition Overall Cognitive Status: No family/caregiver present to determine baseline cognitive functioning Orientation Level: Oriented to person, Oriented to place Cognition Arousal/Alertness: Awake/alert Behavior During Therapy: Flat affect Overall Cognitive Status: No family/caregiver present to determine baseline cognitive functioning Area of Impairment: Attention, Following commands, Safety/judgement, Awareness, Problem solving, Orientation Orientation Level: Time, Situation Current Attention Level: Sustained Following Commands: Follows one step commands with increased time, Follows one step commands consistently Safety/Judgement: Decreased awareness of deficits, Decreased awareness of safety Awareness: Intellectual Problem Solving: Slow processing, Decreased initiation, Requires verbal cues, Requires tactile cues General Comments: pt able to state "hospital" and "bathroom" today to respond to place and needs. Pt pleasant with maintained decreased attention to right Difficult to assess due to: Level of arousal  Physical Exam: Blood pressure (!) 175/71, pulse 67, temperature (!) 97.3 F (36.3 C), resp. rate 20, height 5' 6.25" (1.683 m), weight 73.4  kg, SpO2 94 %. Physical Exam  Constitutional:  lethargic  HENT:  Right Ear: External ear normal.  Left Ear: External ear normal.  Craniotomy site clean and dry  Eyes: Pupils are equal, round, and reactive to light. Conjunctivae are normal.  Neck: Normal range of motion. No thyromegaly present.  Cardiovascular: Normal rate and regular rhythm. Exam reveals no friction rub.  No murmur heard. Respiratory: Effort normal. No respiratory distress. He has no wheezes.  GI: Soft. He exhibits no distension. There is no abdominal tenderness.  Musculoskeletal:        General: No edema.  Neurological:  Lethargic but does open eyes. Lateral deviation of left eye. Able to look up and down.  Follows simple commands with verbal and tactile cues. Does utter words which are dysarthric but unrelated to my questions. Moves all 4s, left more than right and withdraws to pain on all 4's.   Skin: Skin is warm.  Psychiatric:  Confused, lethargic.     Results for orders placed or performed during the hospital encounter of 07/07/18 (from the past 48 hour(s))  Glucose, capillary     Status: Abnormal   Collection Time: 07/16/18  4:10 PM  Result Value Ref Range   Glucose-Capillary 112 (H) 70 - 99 mg/dL   Comment 1 Notify RN    Comment 2 Document in Chart   Glucose, capillary     Status: Abnormal   Collection Time: 07/16/18  8:22 PM  Result Value Ref Range   Glucose-Capillary 201 (H) 70 - 99 mg/dL  Glucose, capillary     Status: Abnormal   Collection Time: 07/17/18 12:29 AM  Result Value Ref Range   Glucose-Capillary 129 (H) 70 - 99 mg/dL  Glucose, capillary     Status: Abnormal   Collection Time: 07/17/18  3:56 AM  Result Value Ref Range   Glucose-Capillary 157 (H) 70 - 99 mg/dL  Glucose, capillary     Status: Abnormal   Collection Time: 07/17/18  8:16 AM  Result Value Ref Range   Glucose-Capillary 127 (H) 70 - 99 mg/dL   Comment 1 Notify RN    Comment 2 Document in Chart   Glucose, capillary     Status: Abnormal   Collection Time: 07/17/18 12:12 PM  Result Value Ref Range   Glucose-Capillary 134 (H) 70 - 99 mg/dL   Comment 1 Notify RN    Comment 2 Document in Chart   Glucose, capillary     Status: Abnormal   Collection Time: 07/17/18  4:16 PM  Result Value Ref Range   Glucose-Capillary 131 (H) 70 - 99 mg/dL   Comment 1 Notify RN    Comment 2 Document in Chart    No results found.     Medical Problem List and Plan: 1.  Decreased functional mobility with right side weakness secondary to traumatic left subdural hematoma. Status post craniotomy evacuation 07/07/2018 followed by reaccumulation of SDH with redo craniotomy 07/13/2018  -admit to inpatient rehab 2.   Antithrombotics: -DVT/anticoagulation:  SCDs  -antiplatelet therapy: N/A 3. Pain Management:  Tylenol as needed 4. Mood/mild mental retardation with schizophrenia: Patient on Klonopin 0.5 mg daily prior to admission and Paxil 20 mg daily, Risperdal1 mg twice a day. Resume as needed  -antipsychotic agents: N/A 5. Neuropsych: This patient he has not capable of making decisions on his own behalf. 6. Skin/Wound Care:  Routine skin checks 7. Fluids/Electrolytes/Nutrition:  Routine ins and outs with follow-up chemistries 8. Seizure prophylaxis. Keppra 500 mg twice a  day 9. Dysphagia. Dysphagia #1 thin liquids. Follow-up speech therapy 10.Tobacco abuse. Counseling     Charlton Amor, PA-C 07/18/2018

## 2018-07-18 NOTE — Progress Notes (Signed)
Physical Therapy Treatment Patient Details Name: Michael Mercado MRN: [id number] DOB: 01/19/1941 Today's Date: 07/18/2018    History of Present Illness 78 year old Caucasian male with intellectual disability, anxiety and schizophrenia presented with AMS/unresponsive and R sided neurologic deficits.  Found to have a large L SDH with midline shift. s/p L crani for evacuation of subdural hematoma. Recurrent Left SDH with additional crani 3/25    PT Comments    Pt pleasant, awake and ready to get up today. Pt able to state "hospital" for place and stating need for bathroom with BM at toilet once mobilizing to bathroom. Improved balance, gait and communication this session with maintained right inattention. Will continue to follow to maximize function and decrease burden of care and recommend daily mobility with nursing staff.     Follow Up Recommendations  CIR;Supervision/Assistance - 24 hour     Equipment Recommendations  Rolling walker with 5" wheels    Recommendations for Other Services       Precautions / Restrictions Precautions Precautions: Fall Precaution Comments: cortrak Restrictions Weight Bearing Restrictions: No    Mobility  Bed Mobility Overal bed mobility: Needs Assistance Bed Mobility: Supine to Sit     Supine to sit: Supervision     General bed mobility comments: pt with supervision for lines and safety, with cues to initiate pt able to roll to side and rise without sequential cues or assist  Transfers Overall transfer level: Needs assistance   Transfers: Sit to/from Stand Sit to Stand: Min guard         General transfer comment: cues for hand placement and safety with only close guarding and cues needed to stand from bed and toilet this session.   Ambulation/Gait Ambulation/Gait assistance: Mod assist;+2 safety/equipment Gait Distance (Feet): 300 Feet Assistive device: Rolling walker (2 wheeled) Gait Pattern/deviations: Step-through  pattern;Decreased stride length;Trunk flexed;Narrow base of support;Decreased stance time - left   Gait velocity interpretation: 1.31 - 2.62 ft/sec, indicative of limited community ambulator General Gait Details: Pt with improved balance, gait and function during activity today. Pt with decreased right lean and able to maintain self closer to in midline in walker. He continues to be easily distracted and with distraction increased LOB with mod assist to recover. Decreased attention to right with max cues to attend to right side and safety of environment. I   Stairs             Wheelchair Mobility    Modified Rankin (Stroke Patients Only) Modified Rankin (Stroke Patients Only) Pre-Morbid Rankin Score: No significant disability Modified Rankin: Moderately severe disability     Balance Overall balance assessment: Needs assistance Sitting-balance support: Feet supported;No upper extremity supported Sitting balance-Leahy Scale: Fair Sitting balance - Comments: pt able to sit EOB and at toilet without assist     Standing balance-Leahy Scale: Poor Standing balance comment: pt able to stand without physical assist but requires UE support to maintain standing                            Cognition Arousal/Alertness: Awake/alert Behavior During Therapy: Flat affect Overall Cognitive Status: No family/caregiver present to determine baseline cognitive functioning Area of Impairment: Attention;Following commands;Safety/judgement;Awareness;Problem solving;Orientation                 Orientation Level: Time;Situation Current Attention Level: Sustained   Following Commands: Follows one step commands with increased time;Follows one step commands consistently Safety/Judgement: Decreased awareness of deficits;Decreased awareness  of safety   Problem Solving: Slow processing;Decreased initiation;Requires verbal cues;Requires tactile cues General Comments: pt able to state  "hospital" and "bathroom" today to respond to place and needs. Pt pleasant with maintained decreased attention to right      Exercises      General Comments        Pertinent Vitals/Pain Pain Assessment: No/denies pain    Home Living                      Prior Function            PT Goals (current goals can now be found in the care plan section) Progress towards PT goals: Progressing toward goals    Frequency           PT Plan Current plan remains appropriate    Co-evaluation              AM-PAC PT "6 Clicks" Mobility   Outcome Measure  Help needed turning from your back to your side while in a flat bed without using bedrails?: A Little Help needed moving from lying on your back to sitting on the side of a flat bed without using bedrails?: A Little Help needed moving to and from a bed to a chair (including a wheelchair)?: A Little Help needed standing up from a chair using your arms (e.g., wheelchair or bedside chair)?: A Little Help needed to walk in hospital room?: A Lot Help needed climbing 3-5 steps with a railing? : A Lot 6 Click Score: 16    End of Session Equipment Utilized During Treatment: Gait belt Activity Tolerance: Patient tolerated treatment well Patient left: in chair;with call bell/phone within reach;with chair alarm set;with restraints reapplied(bil mittens) Nurse Communication: Mobility status PT Visit Diagnosis: Unsteadiness on feet (R26.81);Other abnormalities of gait and mobility (R26.89);Other symptoms and signs involving the nervous system (R29.898)     Time: 6712-4580 PT Time Calculation (min) (ACUTE ONLY): 27 min  Charges:  $Gait Training: 8-22 mins $Therapeutic Activity: 8-22 mins                     Mychaela Lennartz Abner Greenspan, PT Acute Rehabilitation Services Pager: [phone] Office: [phone]    Enedina Finner Zai Chmiel 07/18/2018, 10:54 AM

## 2018-07-18 NOTE — Progress Notes (Signed)
  Speech Language Pathology Treatment: Dysphagia  Patient Details Name: Michael Mercado MRN: [id number] DOB: September 21, 1940 Today's Date: 07/18/2018 Time: 1215-1230 SLP Time Calculation (min) (ACUTE ONLY): 15 min  Assessment / Plan / Recommendation Clinical Impression  Patient seen for follow-up for dysphagia. Medically stable over the weekend and had just transferred to SDU. Cortrak still in place. SLP provided upgraded texture trials of thin liquids; pt exhibited no overt signs of aspiration initially despite impulsivity. With cup sips, pt requires assistance to navigate around cortrak. More independent with straw, however he is extremely impulsive, rapidly consuming entire cups of liquid without intervention to slow rate. Pt did have immediate cough x1 with rapid intake. With SLP assisting with slow rate, no overt signs of aspiration. Pt was able to follow cues to cough throughout treatment. Advise advancing pt to thin liquids, no straws, with full supervision to assist with slow rate. Continue puree with meds crushed, and esophageal precautions. MD may wish to consider removal of cortrak if pt's oral intake adequate at this time; d/w RN. Will follow up.     HPI HPI: Pt is a 78 year old male with history of moderate intellectual disablity, tobacco abuse, COPD,  HTN, HLD, CKD stage II who presented to [facility] 07/06/18 after fall.  Apparently he slipped and fell getting out of shower but denied LOC.  He was evaluated, asymptomatic, and sent home but then returned on 07/07/18 after additional fall from bed around 0115. At that time he was unresponsive with right facial droop and hypertensive. Code stroke activated. CT head showed large subdural hematoma 19 mm of left-to-right shift.  He was intubated for airway protection and transferred emergently to [facility] for Neurosurgery evaluation. Left craniotomy was completed on 07/07/18 for exacuation and pt was extubated following surgery. Re-do  craniotomy 3/25.        SLP Plan  Continue with current plan of care       Recommendations  Diet recommendations: Dysphagia 1 (puree);Thin liquid Liquids provided via: Cup Medication Administration: Crushed with puree Supervision: Full supervision/cueing for compensatory strategies Compensations: Minimize environmental distractions;Slow rate;Small sips/bites;Clear throat intermittently;Hard cough after swallow Postural Changes and/or Swallow Maneuvers: Seated upright 90 degrees;Upright 30-60 min after meal                Oral Care Recommendations: Oral care QID Follow up Recommendations: Inpatient Rehab SLP Visit Diagnosis: Dysphagia, oropharyngeal phase (R13.12) Plan: Continue with current plan of care       GO               Rondel Baton, MS, CCC-SLP Speech-Language Pathologist Acute Rehabilitation Services Pager: [phone] Office: [phone]  Arlana Lindau 07/18/2018, 2:30 PM

## 2018-07-18 NOTE — Progress Notes (Signed)
Nutrition Follow-up  DOCUMENTATION CODES:   Not applicable  INTERVENTION:   D/C nocturnal tube feedings  Encourage PO intake at meals  Hormel shake (nectar thick) BID with meals, each supplement provides 480-500 kcals and 20-23 grams of protein  NUTRITION DIAGNOSIS:   Inadequate oral intake related to dysphagia as evidenced by meal completion < 50%. Ongoing.   GOAL:   Patient will meet greater than or equal to 90% of their needs Progressing.   MONITOR:   PO intake, Supplement acceptance  REASON FOR ASSESSMENT:   Consult Enteral/tube feeding initiation and management  ASSESSMENT:   77 y/o male pmhx mental retardation, HTN. Resident of a group home. Presents to ED after falling and striking head. Developed R side weakness. CT head showed L SDH w/ mass effect and midline shift s/p emergent L sided craniotomy 3/19.   Pt discussed during ICU rounds and with RN.  Per RN pt has ate 100% of his meals.  Plan to transfer to [facility]  3/21 Cortrak placed 3/26 Cortrak replaced by MD with two step xray 3/27 SLP able to start PO diet after MBS, pt now on Dysphagia 1 with Nectar thick liquids  Labs and meds reviewed Pt is + 15 L   Diet Order:   Diet Order            DIET - DYS 1 Room service appropriate? No; Fluid consistency: Thin  Diet effective now              EDUCATION NEEDS:   Not appropriate for education at this time  Skin:  Skin Assessment: Skin Integrity Issues: Skin Integrity Issues:: Incisions Incisions: Surgical incision-L skull  Last BM:  3/30  Height:   Ht Readings from Last 1 Encounters:  07/07/18 5' 6.25" (1.683 m)    Weight:   Wt Readings from Last 1 Encounters:  07/10/18 73.4 kg    Ideal Body Weight:  65.2 kg  BMI:  Body mass index is 25.92 kg/m.  Estimated Nutritional Needs:   Kcal:  1700-1950 kcals (23-25 kcal/kg bw)  Protein:  85-100g Pro (~20% energy needs)  Fluid:  >1.9 L (25 ml/kg bw)   Kendell Bane RD, LDN,  CNSC [phone] Pager [phone] After Hours Pager

## 2018-07-18 NOTE — TOC Progression Note (Signed)
Transition of Care [facility]) - Progression Note    Patient Details  Name: GARRICK SOLOMON MRN: [id number] Date of Birth: Feb 15, 1941  Transition of Care [facility]) CM/SW Contact  Doy Hutching, [place] Phone Number: 07/18/2018, 1:30 PM  Clinical Narrative:    CSW spoke with CIR coordinator Genie. I will follow for SNF should CIR not be appropriate.    Expected Discharge Plan: Skilled Nursing Facility Barriers to Discharge: Continued Medical Work up  Expected Discharge Plan and Services Expected Discharge Plan: Skilled Nursing Facility In-house Referral: Clinical Social Work   Post Acute Care Choice: Skilled Nursing Facility Living arrangements for the past 2 months: Group Home                   Social Determinants of Health (SDOH) Interventions    Readmission Risk Interventions No flowsheet data found.

## 2018-07-18 NOTE — Progress Notes (Signed)
Occupational Therapy Treatment Patient Details Name: Michael Mercado MRN: [id number] DOB: 1940-09-21 Today's Date: 07/18/2018    History of present illness 78 year old Caucasian male with intellectual disability, anxiety and schizophrenia presented with AMS/unresponsive and R sided neurologic deficits.  Found to have a large L SDH with midline shift. s/p L crani for evacuation of subdural hematoma. Recurrent Left SDH with additional crani 3/25   OT comments  This 78 yo male admitted with above presents to acute OT with making progress with grooming, locating objects in front of him and transfers. He will continue to benefit from acute OT with follow up OT on CIR.   Follow Up Recommendations  CIR;Supervision/Assistance - 24 hour    Equipment Recommendations  Other (comment)(TBD next venue)    Recommendations for Other Services Rehab consult    Precautions / Restrictions Precautions Precautions: Fall Precaution Comments: cortrak Restrictions Weight Bearing Restrictions: No       Mobility Bed Mobility Overal bed mobility: Needs Assistance Bed Mobility: Supine to Sit     Supine to sit: Min assist        Transfers Overall transfer level: Needs assistance Equipment used: Rolling walker (2 wheeled) Transfers: Sit to/from Stand Sit to Stand: Min assist              Balance Overall balance assessment: Needs assistance Sitting-balance support: Feet supported;No upper extremity supported Sitting balance-Leahy Scale: Fair     Standing balance support: Bilateral upper extremity supported Standing balance-Leahy Scale: Poor                             ADL either performed or assessed with clinical judgement   ADL Overall ADL's : Needs assistance/impaired Eating/Feeding: Supervision/ safety;Sitting Eating/Feeding Details (indicate cue type and reason): drinking water from cup with straw--constant verbal cues to take small sips Grooming: Set  up;Supervision/safety;Sitting;Wash/dry face                   Toilet Transfer: Minimal assistance;Ambulation Toilet Transfer Details (indicate cue type and reason): bed>around to recliner on other side of bed ; needed more A for manuvering RW than with actual balance                 Vision Baseline Vision/History: Wears glasses Wears Glasses: At all times Additional Comments: Pt continues with left eye to far left and right eye tracking in all directions. Once pt understood what I was asking of him he was able to find all the objects on the table in front of him.          Cognition Arousal/Alertness: Awake/alert Behavior During Therapy: Flat affect Overall Cognitive Status: No family/caregiver present to determine baseline cognitive functioning                         Following Commands: Follows one step commands with increased time       General Comments: Pt able to state where a sock went, but could not tell me sock. He could not tell me what the tube of toothpaste was, but was able to tell me where you use toothpaste. Pt perservating on needing to urinate but had foley cath.                   Pertinent Vitals/ Pain       Pain Assessment: No/denies pain         Frequency  Min 3X/week        Progress Toward Goals  OT Goals(current goals can now be found in the care plan section)  Progress towards OT goals: Progressing toward goals     Plan Discharge plan remains appropriate       AM-PAC OT "6 Clicks" Daily Activity     Outcome Measure   Help from another person eating meals?: A Lot Help from another person taking care of personal grooming?: A Lot Help from another person toileting, which includes using toliet, bedpan, or urinal?: A Lot Help from another person bathing (including washing, rinsing, drying)?: A Lot Help from another person to put on and taking off regular upper body clothing?: A Lot Help from another person to put on  and taking off regular lower body clothing?: A Lot 6 Click Score: 12    End of Session Equipment Utilized During Treatment: Gait belt;Rolling walker  OT Visit Diagnosis: Muscle weakness (generalized) (M62.81);Feeding difficulties (R63.3);Cognitive communication deficit (R41.841);Hemiplegia and hemiparesis;Other abnormalities of gait and mobility (R26.89) Symptoms and signs involving cognitive functions: Cerebral infarction Hemiplegia - Right/Left: Right Hemiplegia - dominant/non-dominant: Dominant Hemiplegia - caused by: Cerebral infarction   Activity Tolerance Patient tolerated treatment well   Patient Left in chair;with call bell/phone within reach;with chair alarm set   Nurse Communication Mobility status        Time: 2919-1660 OT Time Calculation (min): 36 min  Charges: OT General Charges $OT Visit: 1 Visit OT Treatments $Self Care/Home Management : 23-37 mins  [name], OTR/L Acute [employer] Pager [phone] Office [phone]     [name] 07/18/2018, 4:49 PM

## 2018-07-18 NOTE — Plan of Care (Signed)
  Problem: Education: Goal: Ability to verbalize activity precautions or restrictions will improve Outcome: Progressing Goal: Knowledge of the prescribed therapeutic regimen will improve Outcome: Progressing   

## 2018-07-19 ENCOUNTER — Inpatient Hospital Stay (HOSPITAL_COMMUNITY)
Admission: RE | Admit: 2018-07-19 | Discharge: 2018-08-06 | DRG: 057 | Disposition: A | Discharge status: Home Health | Payer: Medicare Other | Source: Intra-hospital | Attending: Physical Medicine & Rehabilitation | Admitting: Physical Medicine & Rehabilitation

## 2018-07-19 DIAGNOSIS — S065X9A Traumatic subdural hemorrhage with loss of consciousness of unspecified duration, initial encounter: Secondary | ICD-10-CM | POA: Diagnosis present

## 2018-07-19 DIAGNOSIS — I69851 Hemiplegia and hemiparesis following other cerebrovascular disease affecting right dominant side: Principal | ICD-10-CM

## 2018-07-19 DIAGNOSIS — F71 Moderate intellectual disabilities: Secondary | ICD-10-CM | POA: Diagnosis present

## 2018-07-19 DIAGNOSIS — S065X3S Traumatic subdural hemorrhage with loss of consciousness of 1 hour to 5 hours 59 minutes, sequela: Secondary | ICD-10-CM | POA: Diagnosis not present

## 2018-07-19 DIAGNOSIS — D62 Acute posthemorrhagic anemia: Secondary | ICD-10-CM | POA: Diagnosis present

## 2018-07-19 DIAGNOSIS — F209 Schizophrenia, unspecified: Secondary | ICD-10-CM | POA: Diagnosis present

## 2018-07-19 DIAGNOSIS — Z716 Tobacco abuse counseling: Secondary | ICD-10-CM | POA: Diagnosis not present

## 2018-07-19 DIAGNOSIS — S065X0S Traumatic subdural hemorrhage without loss of consciousness, sequela: Secondary | ICD-10-CM | POA: Diagnosis not present

## 2018-07-19 DIAGNOSIS — F39 Unspecified mood [affective] disorder: Secondary | ICD-10-CM | POA: Diagnosis present

## 2018-07-19 DIAGNOSIS — I10 Essential (primary) hypertension: Secondary | ICD-10-CM | POA: Diagnosis present

## 2018-07-19 DIAGNOSIS — R1312 Dysphagia, oropharyngeal phase: Secondary | ICD-10-CM | POA: Diagnosis not present

## 2018-07-19 DIAGNOSIS — Z79899 Other long term (current) drug therapy: Secondary | ICD-10-CM

## 2018-07-19 DIAGNOSIS — F17210 Nicotine dependence, cigarettes, uncomplicated: Secondary | ICD-10-CM | POA: Diagnosis present

## 2018-07-19 DIAGNOSIS — R739 Hyperglycemia, unspecified: Secondary | ICD-10-CM

## 2018-07-19 DIAGNOSIS — E785 Hyperlipidemia, unspecified: Secondary | ICD-10-CM | POA: Diagnosis present

## 2018-07-19 DIAGNOSIS — Z823 Family history of stroke: Secondary | ICD-10-CM

## 2018-07-19 DIAGNOSIS — I69820 Aphasia following other cerebrovascular disease: Secondary | ICD-10-CM

## 2018-07-19 DIAGNOSIS — F419 Anxiety disorder, unspecified: Secondary | ICD-10-CM | POA: Diagnosis present

## 2018-07-19 DIAGNOSIS — R0989 Other specified symptoms and signs involving the circulatory and respiratory systems: Secondary | ICD-10-CM | POA: Diagnosis not present

## 2018-07-19 DIAGNOSIS — D72829 Elevated white blood cell count, unspecified: Secondary | ICD-10-CM | POA: Diagnosis present

## 2018-07-19 DIAGNOSIS — R1310 Dysphagia, unspecified: Secondary | ICD-10-CM | POA: Diagnosis present

## 2018-07-19 DIAGNOSIS — Z7982 Long term (current) use of aspirin: Secondary | ICD-10-CM

## 2018-07-19 DIAGNOSIS — I69891 Dysphagia following other cerebrovascular disease: Secondary | ICD-10-CM | POA: Diagnosis not present

## 2018-07-19 DIAGNOSIS — R350 Frequency of micturition: Secondary | ICD-10-CM | POA: Diagnosis present

## 2018-07-19 DIAGNOSIS — S065XAA Traumatic subdural hemorrhage with loss of consciousness status unknown, initial encounter: Secondary | ICD-10-CM | POA: Diagnosis present

## 2018-07-19 LAB — GLUCOSE, CAPILLARY
Glucose-Capillary: 76 mg/dL (ref 70–99)
Glucose-Capillary: 107 mg/dL — ABNORMAL HIGH (ref 70–99)

## 2018-07-19 MED ORDER — ACETAMINOPHEN 325 MG PO TABS
650.0000 mg | ORAL_TABLET | ORAL | Status: DC | PRN
  Administered 2018-07-21 – 2018-07-26 (×4): 650 mg via ORAL
  Filled 2018-07-19 (×4): qty 2

## 2018-07-19 MED ORDER — LEVETIRACETAM 500 MG PO TABS
500.0000 mg | ORAL_TABLET | Freq: Two times a day (BID) | ORAL | Status: DC
  Administered 2018-07-19 – 2018-08-06 (×36): 500 mg via ORAL
  Filled 2018-07-19 (×28): qty 1
  Filled 2018-07-19: qty 2
  Filled 2018-07-19 (×7): qty 1

## 2018-07-19 MED ORDER — ONDANSETRON HCL 4 MG PO TABS: 4.0000 mg | ORAL_TABLET | ORAL | Status: DC | PRN

## 2018-07-19 MED ORDER — ACETAMINOPHEN 650 MG RE SUPP: 650.0000 mg | RECTAL | Status: DC | PRN

## 2018-07-19 MED ORDER — PANTOPRAZOLE SODIUM 40 MG PO PACK
40.0000 mg | PACK | Freq: Every day | ORAL | Status: DC
  Administered 2018-07-19 – 2018-07-22 (×4): 40 mg via ORAL
  Filled 2018-07-19 (×2): qty 20

## 2018-07-19 MED ORDER — ONDANSETRON HCL 4 MG/2ML IJ SOLN: 4.0000 mg | INTRAMUSCULAR | Status: DC | PRN

## 2018-07-19 MED ORDER — SENNA 8.6 MG PO TABS
1.0000 | ORAL_TABLET | Freq: Two times a day (BID) | ORAL | Status: DC
  Administered 2018-07-19 – 2018-08-06 (×33): 8.6 mg via ORAL
  Filled 2018-07-19 (×33): qty 1

## 2018-07-19 NOTE — Progress Notes (Signed)
Patient ID: Michael Mercado, male   DOB: 02-May-1940, 78 y.o.   MRN: [id number] Seems stable, MAEx4, grips weakly on R to command, some words, incision ok. Awaiting placement.

## 2018-07-19 NOTE — Care Management Important Message (Signed)
Important Message  Patient Details  Name: PAXON CAVITT MRN: [id number] Date of Birth: 1940/10/14   Medicare Important Message Given:  Yes    Dorena Bodo 07/19/2018, 3:30 PM

## 2018-07-19 NOTE — Progress Notes (Signed)
  Speech Language Pathology Treatment:    Patient Details Name: Michael Mercado MRN: [id number] DOB: 1940/12/23 Today's Date: 07/19/2018 Time: 1232-1250 SLP Time Calculation (min) (ACUTE ONLY): 18 min  Assessment / Plan / Recommendation Clinical Impression  Pt was seen for dysphagia treatment to assess tolerance of the upgraded diet. His nurse reported that he coughed once with thin liquids during breakfast but otherwise tolerated the meal well. He was alert and cooperative throughout the session. He tolerated puree solids without overt s/sx of aspiration and no significant oral residue was noted. Pt required consistent cues and hand-over-hand assistance to reduce bolus size and intake rate. However, despite this level of support, he exhibited coughing with 4/15 trials of thin liquids via cup sip. SLP will continue to follow pt to assess continued tolerance of the recommended diet. However, if symptoms persist, use of a controlled flow up or re-initiating nectar thick liquids may be warranted.     HPI HPI: Pt is a 78 year old male with history of moderate intellectual disablity, tobacco abuse, COPD,  HTN, HLD, CKD stage II who presented to [facility] 07/06/18 after fall.  Apparently he slipped and fell getting out of shower but denied LOC.  He was evaluated, asymptomatic, and sent home but then returned on 07/07/18 after additional fall from bed around 0115. At that time he was unresponsive with right facial droop and hypertensive. Code stroke activated. CT head showed large subdural hematoma 19 mm of left-to-right shift.  He was intubated for airway protection and transferred emergently to [facility] for Neurosurgery evaluation. Left craniotomy was completed on 07/07/18 for exacuation and pt was extubated following surgery. Re-do craniotomy 3/25.        SLP Plan  Continue with current plan of care       Recommendations  Diet recommendations: Dysphagia 1 (puree);Thin liquid Liquids  provided via: Cup Medication Administration: Crushed with puree Supervision: Full supervision/cueing for compensatory strategies Compensations: Minimize environmental distractions;Slow rate;Small sips/bites;Clear throat intermittently;Hard cough after swallow Postural Changes and/or Swallow Maneuvers: Seated upright 90 degrees;Upright 30-60 min after meal                Oral Care Recommendations: Oral care BID Follow up Recommendations: Inpatient Rehab SLP Visit Diagnosis: Dysphagia, oropharyngeal phase (R13.12) Plan: Continue with current plan of care       Michael Mercado I. Vear Clock, MS, CCC-SLP Acute Rehabilitation Services Office number [phone] Pager [phone]               Michael Mercado 07/19/2018, 2:29 PM

## 2018-07-19 NOTE — PMR Pre-admission (Signed)
PMR Admission Coordinator Pre-Admission Assessment  Patient: Michael Mercado is an 78 y.o., male MRN: [id number] DOB: [birth year] Height: 5' 6.25" (168.3 cm) Weight: 73.4 kg              Insurance Information HMO: No    PPO:       PCP:       IPA:       80/20:       OTHER:   PRIMARY:  Medicare A and B      Policy#: [id number]      Subscriber: patient CM Name:        Phone#:       Fax#:   Pre-Cert#:        Employer: Not employed/disabled Benefits:  Phone #:       Name: Checked in Passport One Source Eff. Date: 10/19/71     Deduct: $1408      Out of Pocket Max: nne      Life Max: N/A CIR: 100%      SNF: 100 days Outpatient: 80%     Co-Pay: 20% Home Health: 100%      Co-Pay: none DME: 80%     Co-Pay: 20% Providers: patient's choice  SECONDARY: Medicaid of [place]      Policy#: [id number]      Subscriber: patient CM Name:        Phone#:       Fax#:   Pre-Cert#:        Employer: Not employed/disabled Benefits:  Phone #: [phone]     Name: Automated Eff. Date: Eligible 07/14/18 with coverage code [id number]     Deduct:        Out of Pocket Max:        Life Max:   CIR:        SNF:   Outpatient:       Co-Pay:   Home Health:        Co-Pay:   DME:       Co-Pay:    Medicaid Application Date:        Case Manager:   Disability Application Date:        Case Worker:    The "Data Collection Information Summary" for patients in Inpatient Rehabilitation Facilities with attached "Privacy Act Statement-Health Care Records" was provided and verbally reviewed with: Family.  Reviewed with niece, Sheppard Penton [facility]   Emergency Contact Information Contact Information    Name Relation Home Work Mobile   [facility] Other [phone]     [name] Niece [phone]       Current Medical History  Patient Admitting Diagnosis:  L SDH after fall  History of Present Illness: A 78 year old right-handed male with history of hypertension on Norvasc 10 mg daily, hyperlipidemia, mild mental  retardation with anxiety/schizophrenia maintained on Klonopin 0.5 mg daily as needed, Risperdal 1 mg twice a day as well as Paxil 20 mg daily at bedtime and also history of tobacco abuse. Per report patient lives at [facility] where he has resided for a number of years. He was able to manage basic ADLs prior to admission and was also doing some simple volunteer work. Presented 07/07/2018 after a fall getting out of the shower. Per report patient did strike his head but did not lose consciousness. Cranial CT scan showed a large mixed attenuation left whole low hemispheric subdural hematoma measuring 2.8 cm in maximal diameter with associated extensive mass effect and left-to-right midline shift. Underwent left craniotomy  for evacuation of subdural hematoma 07/07/2018 per Dr. Marikay Alar. He was extubated 07/07/2018 and maintained on Keppra for seizure prophylaxis. On 07/13/2018 patient more difficult to arouse was not moving his right arm as well as some increased facial weakness. Follow-up neurosurgery felt need for reexploration and underwent redo left craniotomy for evacuation of acute subdural hematoma 07/13/2018. Patient did require nasogastric tube for nutritional support. His diet has been advanced to dysphagia #1 thin liquid. Follow-up cranial CT scan 07/14/2018 showed decreased subdural blood on the left with subdural drain. Subdural drain has been removed. Decreased mass effect and midline shift now 6 mm as compared to 13 mm on prior tracings. Patient's level of alertness has continued to improve. Therapy evaluations ongoing with recommendations of physical medicine rehabilitation consult. Patient to be admitted for a comprehensive inpatient rehabilitation program.   Complete NIHSS TOTAL: 34 Glasgow Coma Scale Score: 15  Past Medical History  Past Medical History:  Diagnosis Date  . Hypercholesteremia   . Hypertension   . Moderate intellectual disability   . Mood disorder (HCC)   . MR  (mental retardation)   . Obesity     Family History  family history includes Stroke in his sister.  Prior Rehab/Hospitalizations: No recent hospitalizations and no recent rehab.  Has the patient had prior rehab or hospitalizations prior to admission? No  Has the patient had major surgery during 100 days prior to admission? Yes.  Had craniotomy twice during acute hospital stay prior to rehab.  Current Medications   Current Facility-Administered Medications:  .  0.9 % NaCl with KCl 20 mEq/ L  infusion, , Intravenous, Continuous, Tia Alert, MD, Last Rate: 50 mL/hr at 07/19/18 0600 .  acetaminophen (TYLENOL) tablet 650 mg, 650 mg, Oral, Q4H PRN, 650 mg at 07/15/18 0327 **OR** acetaminophen (TYLENOL) suppository 650 mg, 650 mg, Rectal, Q4H PRN, Tia Alert, MD .  chlorhexidine (PERIDEX) 0.12 % solution 15 mL, 15 mL, Mouth Rinse, BID, Tia Alert, MD, 15 mL at 07/19/18 0933 .  hydrALAZINE (APRESOLINE) injection 10 mg, 10 mg, Intravenous, Q4H PRN, Tia Alert, MD, 10 mg at 07/17/18 7846 .  labetalol (NORMODYNE,TRANDATE) injection 10-40 mg, 10-40 mg, Intravenous, Q10 min PRN, Tia Alert, MD, 20 mg at 07/17/18 0110 .  levETIRAcetam (KEPPRA) IVPB 500 mg/100 mL premix, 500 mg, Intravenous, Q12H, Tia Alert, MD, Last Rate: 400 mL/hr at 07/19/18 0900, 500 mg at 07/19/18 0900 .  MEDLINE mouth rinse, 15 mL, Mouth Rinse, q12n4p, Tia Alert, MD, 15 mL at 07/18/18 1849 .  morphine 2 MG/ML injection 1-2 mg, 1-2 mg, Intravenous, Q2H PRN, Tia Alert, MD .  ondansetron [facility]) tablet 4 mg, 4 mg, Oral, Q4H PRN **OR** ondansetron (ZOFRAN) injection 4 mg, 4 mg, Intravenous, Q4H PRN, Tia Alert, MD .  pantoprazole sodium (PROTONIX) 40 mg/20 mL oral suspension 40 mg, 40 mg, Per Tube, QHS, Tia Alert, MD, 40 mg at 07/18/18 2128 .  Resource ThickenUp Clear, , Oral, PRN, Tia Alert, MD .  Gwyndolyn Kaufman [facility]) tablet 8.6 mg, 1 tablet, Oral, BID, Tia Alert, MD, 8.6 mg at  07/19/18 9629  Patients Current Diet:  Diet Order            DIET - DYS 1 Room service appropriate? No; Fluid consistency: Thin  Diet effective now              Precautions / Restrictions Precautions Precautions: Fall Precaution Comments: Rt inattention Restrictions Weight Bearing Restrictions:  No   Has the patient had 2 or more falls or a fall with injury in the past year?Yes.  Had one fall in the shower which resulted in current L SDH with craniotomy.  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  Went out daily.  A staff member goes with him.  He does not drive.  Prior Functional Level Prior Function Comments: Not a lot of information available re: PLOF at time of writing this note, except that normally Kei is social and talkative; It is likely he was walking independently, and managing his basic ADLs in the group home setting  Self Care: Did the patient need help bathing, dressing, using the toilet or eating?  Independent  Indoor Mobility: Did the patient need assistance with walking from room to room (with or without device)? Independent  Stairs: Did the patient need assistance with internal or external stairs (with or without device)? Independent  Functional Cognition: Did the patient need help planning regular tasks such as shopping or remembering to take medications? Needed some help.  Group home attendants do shopping and they assist with medication administration.  Home Assistive Devices / Equipment Home Assistive Devices/Equipment: None  Prior Device Use: Indicate devices/aids used by the patient prior to current illness, exacerbation or injury? None  Current Functional Level Cognition  Overall Cognitive Status: No family/caregiver present to determine baseline cognitive functioning Difficult to assess due to: Level of arousal Current Attention Level:  Sustained Orientation Level: Oriented to person, Oriented to place Following Commands: Follows one step commands consistently Safety/Judgement: Decreased awareness of deficits, Decreased awareness of safety General Comments: pt able to state name, "hospital" and stating Yeah and uh uh during session. Pt following commands for transfers and able to state need for toilet paper after toileting.     Extremity Assessment (includes Sensation/Coordination)  Upper Extremity Assessment: RUE deficits/detail RUE Deficits / Details: no spontaneous movement noted.   RUE Coordination: decreased fine motor, decreased gross motor  Lower Extremity Assessment: Defer to PT evaluation RLE Deficits / Details: Slow to move RLE when asked to, but he did participate in lifting RLE for sock donning; Hip, knee, and ankle ROM WFL for dunctional mobiltiy tasks performed today; Noted adductor stiffness bilaterally, seemingly R greater than L; noted bil LE muscle recruitment in weight bearing response for standing when center of mass was shifted over feet LLE Deficits / Details: participated in lifting LLE for sock donning; Hip, knee, and ankle ROM WFL for dunctional mobiltiy tasks performed today; Noted adductor stiffness bilaterally, seemingly R greater than L; noted bil LE muscle recruitment in weight bearing response for standing when center of mass was shifted over feet    ADLs  Overall ADL's : Needs assistance/impaired Eating/Feeding: Supervision/ safety, Sitting Eating/Feeding Details (indicate cue type and reason): drinking water from cup with straw--constant verbal cues to take small sips Grooming: Set up, Supervision/safety, Sitting, Wash/dry face Grooming Details (indicate cue type and reason): using LUE--VCs to wash left side of face Upper Body Bathing: Total assistance, Sitting Lower Body Bathing: Total assistance, Sit to/from stand Upper Body Dressing : Total assistance, Sitting Lower Body Dressing: Total  assistance, Sit to/from stand Toilet Transfer: Minimal assistance, Ambulation Toilet Transfer Details (indicate cue type and reason): bed>around to recliner on other side of bed ; needed more A for manuvering RW than with actual balance Toileting- Clothing Manipulation and Hygiene: Total assistance, Sit to/from  stand Functional mobility during ADLs: Maximal assistance, Moderate assistance, +2 for physical assistance, +2 for safety/equipment General ADL Comments: Pt total A for all basic ADLs. Attempted hand over hand with pt washing his face with LUE while seated EOB, but pt guarding my A to help him. (earlier while supine in bed pt reached up with LUE to wipe at nose--so the movement is there)    Mobility  Overal bed mobility: Needs Assistance Bed Mobility: Supine to Sit Rolling: Mod assist Sidelying to sit: Mod assist Supine to sit: Min assist General bed mobility comments: pt able to roll to left and initiate rise from surface but required increased assist to elevate trunk from surface today with increased time, HOB 30 degrees    Transfers  Overall transfer level: Needs assistance Equipment used: Rolling walker (2 wheeled) Transfers: Sit to/from Stand Sit to Stand: Min assist Stand pivot transfers: Mod assist General transfer comment: cues for hand placement and safety with only close guarding and cues needed to stand from bed and toilet this session.     Ambulation / Gait / Stairs / Wheelchair Mobility  Ambulation/Gait Ambulation/Gait assistance: Min assist, +2 safety/equipment Gait Distance (Feet): 300 Feet Assistive device: 1 person hand held assist Gait Pattern/deviations: Step-through pattern, Decreased stride length, Trunk flexed, Narrow base of support General Gait Details: pt with improved balance and gait with RUE HHA with +2 for lines. pt maintains right inattention and inability to wayfind. Pt with increased assist and decreased gait speed as pt fatigues. Pt unable to self  assess fatigue and needs cues to attend to task and turn around. Gait velocity interpretation: >2.62 ft/sec, indicative of community ambulatory    Posture / Balance Dynamic Sitting Balance Sitting balance - Comments: pt able to sit EOB and at toilet without assist Balance Overall balance assessment: Needs assistance Sitting-balance support: Feet supported, No upper extremity supported Sitting balance-Leahy Scale: Good Sitting balance - Comments: pt able to sit EOB and at toilet without assist Standing balance support: Bilateral upper extremity supported Standing balance-Leahy Scale: Fair Standing balance comment: pt able to stand without physical assist but requires UE support to maintain standing    Special needs/care consideration BiPAP/CPAP No CPM No Continuous Drip IV 0.9% NS with KCL 20 meq/L 50 mL/hr Dialysis No       Life Vest No Oxygen No Special Bed No Trach Size No Wound Vac (area) No     Skin Has stapled left scalp incision.  Niece reports that patient picks at his skin.                            Bowel mgmt: Last BM 07/18/18 Bladder mgmt: External catheter Diabetic mgmt No Behavioral consideration:  Mild mental retardation with schizophrenia.  Has mittens on to keep from pulling IV, tubes out. Chemo/radiation  No    Previous Home Environment (from acute therapy documentation) Living Arrangements: Group Home(Has his own room, alone.)  Lives With: Alone(Alone but in group home.  Has private room.) Available Help at Discharge: Other (Comment)(group home attendants) Type of Home: Group Home Care Facility Name: [facility] Home Layout: One level Home Access: Level entry Bathroom Shower/Tub: Walk-in shower, Curtain Bathroom Toilet: Handicapped height Bathroom Accessibility: Yes How Accessible: Accessible via wheelchair, Accessible via walker Additional Comments: Not a lot of infomation about group home available at time of writing this note  Discharge Living  Setting Plans for Discharge Living Setting: Other (Comment)(From a group  home - [facility]) Type of Home at Discharge: Group Home([facility]) Care [facility]: [facility] Discharge Home Layout: One level(One level house) Discharge Home Access: Level entry Discharge Bathroom Shower/Tub: Walk-in shower, Curtain Discharge Bathroom Toilet: Handicapped height Discharge Bathroom Accessibility: Yes How Accessible: Accessible via wheelchair, Accessible via walker Does the patient have any problems obtaining your medications?: No  Social/Family/Support Systems Patient Roles: Other (Comment)(Has a niece.) Contact Information: Tobie Poet - niece - [phone] Anticipated Caregiver: Staff at the group home Anticipated Caregiver's Contact Information: [name] Wilson - house parent - [phone] Ability/Limitations of Caregiver: Will need to be re-evaluated prior to return to group home by Janet Berlin with admissions at [phone] Caregiver Availability: Intermittent Discharge Plan Discussed with Primary Caregiver: Yes Is Caregiver In Agreement with Plan?: Yes Does Caregiver/Family have Issues with Lodging/Transportation while Pt is in Rehab?: No  Goals/Additional Needs Patient/Family Goal for Rehab: PT/OT/SLP supervision to min assist goals Expected length of stay: 20-27 days Cultural Considerations: None Dietary Needs: Dys 1, thin liquids Equipment Needs: TBD Pt/Family Agrees to Admission and willing to participate: Yes(Spoke with niece, Sheppard Penton, and with "house Parent") Program Orientation Provided & Reviewed with Pt/Caregiver Including Roles  & Responsibilities: Yes  Decrease burden of Care through IP rehab admission: Diet advancement, Decrease number of caregivers and Bowel and bladder program  Possible need for SNF placement upon discharge: Yes, but hopeful that patient will progress back to group home.  Will need to be re-evaluated from  return to group home by Janet Berlin at [phone] with admissions.  Patient Condition: This patient's medical and functional status has changed since the consult dated: 07/11/18 in which the Rehabilitation Physician determined and documented that the patient's condition is appropriate for intensive rehabilitative care in an inpatient rehabilitation facility. See "History of Present Illness" (above) for medical update. Functional changes are: Currently requiring min assist for transfers and min assist (+2 for safety and equipment) to ambulate 300 feet +1 HHA . Patient's medical and functional status update has been discussed with the Rehabilitation physician and patient remains appropriate for inpatient rehabilitation. Will admit to inpatient rehab today.  Preadmission Screen Completed By:  Trish Mage, RN, 07/19/2018 12:06 PM ______________________________________________________________________   Discussed status with Dr. Riley Kill on 07/19/18 at 1205 and received approval for admission today.  Admission Coordinator:  Trish Mage, time 1205/Date 07/19/18

## 2018-07-19 NOTE — Discharge Summary (Signed)
Physician Discharge Summary  Patient ID: Michael Mercado MRN: [id number] DOB/AGE: [birth year] 77 y.o.  Admit date: 07/07/2018 Discharge date: 07/19/2018  Admission Diagnoses:  Left subdural hematoma    Discharge Diagnoses:  same   Discharged Condition: stable  Hospital Course: The patient was admitted on 07/07/2018 and taken to the operating room where the patient underwent  Left craniotomy for subdural hematoma. The patient tolerated the procedure well and was taken to the recovery room and then to the  ICU in stable condition.  The following morning he had a CT scan which showed a not un-expected reaccumulation of blood products ( he had very thick vascular membranes at the time  Of the initial surgery that were difficult to fully coagulate) but less mass effect and clinically the patient was doing much better and was much more awake.  Therefore the decision was to not re-evaluate the subdural hematoma.The wound remained clean dry and intact.  He slowly developed worsening mental status and right upper extremity weakness.  Therefore recommended repeat left craniotomy for evacuation of recurrent subdural hematoma.   He did quite well after this.  He was able to start using some words and moved his right upper extremity better.  He walked around the unit.  He made progress with physical and occupational therapy.The patient remained afebrile with stable vital signs, and tolerated a regular diet. The patient continued to increase activities, and pain was well controlled with oral pain medications.   Consults: CIR  Significant Diagnostic Studies:  Results for orders placed or performed during the hospital encounter of 07/07/18  MRSA PCR Screening  Result Value Ref Range   MRSA by PCR NEGATIVE NEGATIVE  Surgical PCR screen  Result Value Ref Range   MRSA, PCR NEGATIVE NEGATIVE   Staphylococcus aureus NEGATIVE NEGATIVE  Ethanol  Result Value Ref Range   Alcohol, Ethyl (B) <10 <10 mg/dL   Protime-INR  Result Value Ref Range   Prothrombin Time 13.2 11.4 - 15.2 seconds   INR 1.0 0.8 - 1.2  APTT  Result Value Ref Range   aPTT 29 24 - 36 seconds  CBC  Result Value Ref Range   WBC 11.5 (H) 4.0 - 10.5 K/uL   RBC 4.77 4.22 - 5.81 MIL/uL   Hemoglobin 14.1 13.0 - 17.0 g/dL   HCT 40.9 81.1 - 91.4 %   MCV 90.1 80.0 - 100.0 fL   MCH 29.6 26.0 - 34.0 pg   MCHC 32.8 30.0 - 36.0 g/dL   RDW 78.2 95.6 - 21.3 %   Platelets 237 150 - 400 K/uL   nRBC 0.0 0.0 - 0.2 %  Differential  Result Value Ref Range   Neutrophils Relative % 89 %   Neutro Abs 10.2 (H) 1.7 - 7.7 K/uL   Lymphocytes Relative 6 %   Lymphs Abs 0.7 0.7 - 4.0 K/uL   Monocytes Relative 5 %   Monocytes Absolute 0.6 0.1 - 1.0 K/uL   Eosinophils Relative 0 %   Eosinophils Absolute 0.0 0.0 - 0.5 K/uL   Basophils Relative 0 %   Basophils Absolute 0.0 0.0 - 0.1 K/uL   Immature Granulocytes 0 %   Abs Immature Granulocytes 0.04 0.00 - 0.07 K/uL  Comprehensive metabolic panel  Result Value Ref Range   Sodium 136 135 - 145 mmol/L   Potassium 4.0 3.5 - 5.1 mmol/L   Chloride 103 98 - 111 mmol/L   CO2 24 22 - 32 mmol/L   Glucose, Bld 143 (H) 70 -  99 mg/dL   BUN 19 8 - 23 mg/dL   Creatinine, Ser 1.61 0.61 - 1.24 mg/dL   Calcium 9.4 8.9 - 09.6 mg/dL   Total Protein 7.9 6.5 - 8.1 g/dL   Albumin 4.3 3.5 - 5.0 g/dL   AST 23 15 - 41 U/L   ALT 16 0 - 44 U/L   Alkaline Phosphatase 94 38 - 126 U/L   Total Bilirubin 0.5 0.3 - 1.2 mg/dL   GFR calc non Af Amer >60 >60 mL/min   GFR calc Af Amer >60 >60 mL/min   Anion gap 9 5 - 15  Urine rapid drug screen (hosp performed)  Result Value Ref Range   Opiates NONE DETECTED NONE DETECTED   Cocaine NONE DETECTED NONE DETECTED   Benzodiazepines NONE DETECTED NONE DETECTED   Amphetamines NONE DETECTED NONE DETECTED   Tetrahydrocannabinol NONE DETECTED NONE DETECTED   Barbiturates NONE DETECTED NONE DETECTED  Urinalysis, Routine w reflex microscopic  Result Value Ref Range    Color, Urine STRAW (A) YELLOW   APPearance CLEAR CLEAR   Specific Gravity, Urine 1.010 1.005 - 1.030   pH 8.0 5.0 - 8.0   Glucose, UA NEGATIVE NEGATIVE mg/dL   Hgb urine dipstick NEGATIVE NEGATIVE   Bilirubin Urine NEGATIVE NEGATIVE   Ketones, ur 20 (A) NEGATIVE mg/dL   Protein, ur NEGATIVE NEGATIVE mg/dL   Nitrite NEGATIVE NEGATIVE   Leukocytes,Ua NEGATIVE NEGATIVE  Triglycerides  Result Value Ref Range   Triglycerides 30 <150 mg/dL  Basic metabolic panel  Result Value Ref Range   Sodium 139 135 - 145 mmol/L   Potassium 3.7 3.5 - 5.1 mmol/L   Chloride 112 (H) 98 - 111 mmol/L   CO2 19 (L) 22 - 32 mmol/L   Glucose, Bld 132 (H) 70 - 99 mg/dL   BUN 15 8 - 23 mg/dL   Creatinine, Ser 0.45 0.61 - 1.24 mg/dL   Calcium 8.4 (L) 8.9 - 10.3 mg/dL   GFR calc non Af Amer 60 (L) >60 mL/min   GFR calc Af Amer >60 >60 mL/min   Anion gap 8 5 - 15  Magnesium  Result Value Ref Range   Magnesium 2.0 1.7 - 2.4 mg/dL  Phosphorus  Result Value Ref Range   Phosphorus 2.4 (L) 2.5 - 4.6 mg/dL  CBC  Result Value Ref Range   WBC 11.7 (H) 4.0 - 10.5 K/uL   RBC 3.67 (L) 4.22 - 5.81 MIL/uL   Hemoglobin 10.5 (L) 13.0 - 17.0 g/dL   HCT 40.9 (L) 81.1 - 91.4 %   MCV 88.6 80.0 - 100.0 fL   MCH 28.6 26.0 - 34.0 pg   MCHC 32.3 30.0 - 36.0 g/dL   RDW 78.2 95.6 - 21.3 %   Platelets 200 150 - 400 K/uL   nRBC 0.0 0.0 - 0.2 %  Basic metabolic panel  Result Value Ref Range   Sodium 141 135 - 145 mmol/L   Potassium 3.8 3.5 - 5.1 mmol/L   Chloride 113 (H) 98 - 111 mmol/L   CO2 20 (L) 22 - 32 mmol/L   Glucose, Bld 96 70 - 99 mg/dL   BUN 17 8 - 23 mg/dL   Creatinine, Ser 0.86 0.61 - 1.24 mg/dL   Calcium 8.5 (L) 8.9 - 10.3 mg/dL   GFR calc non Af Amer >60 >60 mL/min   GFR calc Af Amer >60 >60 mL/min   Anion gap 8 5 - 15  CBC  Result Value Ref Range  WBC 10.3 4.0 - 10.5 K/uL   RBC 3.66 (L) 4.22 - 5.81 MIL/uL   Hemoglobin 10.5 (L) 13.0 - 17.0 g/dL   HCT 37.1 (L) 06.2 - 69.4 %   MCV 87.7 80.0 - 100.0 fL    MCH 28.7 26.0 - 34.0 pg   MCHC 32.7 30.0 - 36.0 g/dL   RDW 85.4 62.7 - 03.5 %   Platelets 194 150 - 400 K/uL   nRBC 0.0 0.0 - 0.2 %  Glucose, capillary  Result Value Ref Range   Glucose-Capillary 123 (H) 70 - 99 mg/dL   Comment 1 Notify RN    Comment 2 Document in Chart   Glucose, capillary  Result Value Ref Range   Glucose-Capillary 132 (H) 70 - 99 mg/dL  Glucose, capillary  Result Value Ref Range   Glucose-Capillary 123 (H) 70 - 99 mg/dL  Glucose, capillary  Result Value Ref Range   Glucose-Capillary 127 (H) 70 - 99 mg/dL  Glucose, capillary  Result Value Ref Range   Glucose-Capillary 134 (H) 70 - 99 mg/dL   Comment 1 Notify RN    Comment 2 Document in Chart   Glucose, capillary  Result Value Ref Range   Glucose-Capillary 108 (H) 70 - 99 mg/dL   Comment 1 Notify RN    Comment 2 Document in Chart   Glucose, capillary  Result Value Ref Range   Glucose-Capillary 136 (H) 70 - 99 mg/dL   Comment 1 Notify RN    Comment 2 Document in Chart   Glucose, capillary  Result Value Ref Range   Glucose-Capillary 124 (H) 70 - 99 mg/dL  Glucose, capillary  Result Value Ref Range   Glucose-Capillary 117 (H) 70 - 99 mg/dL  Glucose, capillary  Result Value Ref Range   Glucose-Capillary 129 (H) 70 - 99 mg/dL  Glucose, capillary  Result Value Ref Range   Glucose-Capillary 121 (H) 70 - 99 mg/dL  Glucose, capillary  Result Value Ref Range   Glucose-Capillary 108 (H) 70 - 99 mg/dL  Glucose, capillary  Result Value Ref Range   Glucose-Capillary 115 (H) 70 - 99 mg/dL   Comment 1 Notify RN    Comment 2 Document in Chart   Glucose, capillary  Result Value Ref Range   Glucose-Capillary 114 (H) 70 - 99 mg/dL  Glucose, capillary  Result Value Ref Range   Glucose-Capillary 120 (H) 70 - 99 mg/dL  Glucose, capillary  Result Value Ref Range   Glucose-Capillary 118 (H) 70 - 99 mg/dL  Glucose, capillary  Result Value Ref Range   Glucose-Capillary 117 (H) 70 - 99 mg/dL   Comment 1  Notify RN    Comment 2 Document in Chart   Glucose, capillary  Result Value Ref Range   Glucose-Capillary 129 (H) 70 - 99 mg/dL   Comment 1 Notify RN    Comment 2 Document in Chart   Glucose, capillary  Result Value Ref Range   Glucose-Capillary 122 (H) 70 - 99 mg/dL   Comment 1 Notify RN    Comment 2 Document in Chart   Glucose, capillary  Result Value Ref Range   Glucose-Capillary 103 (H) 70 - 99 mg/dL  Glucose, capillary  Result Value Ref Range   Glucose-Capillary 117 (H) 70 - 99 mg/dL  Glucose, capillary  Result Value Ref Range   Glucose-Capillary 102 (H) 70 - 99 mg/dL  Glucose, capillary  Result Value Ref Range   Glucose-Capillary 98 70 - 99 mg/dL   Comment 1 Notify RN  Comment 2 Document in Chart   Glucose, capillary  Result Value Ref Range   Glucose-Capillary 122 (H) 70 - 99 mg/dL   Comment 1 Notify RN    Comment 2 Document in Chart   Glucose, capillary  Result Value Ref Range   Glucose-Capillary 117 (H) 70 - 99 mg/dL  Glucose, capillary  Result Value Ref Range   Glucose-Capillary 122 (H) 70 - 99 mg/dL  Glucose, capillary  Result Value Ref Range   Glucose-Capillary 128 (H) 70 - 99 mg/dL  Glucose, capillary  Result Value Ref Range   Glucose-Capillary 115 (H) 70 - 99 mg/dL  Glucose, capillary  Result Value Ref Range   Glucose-Capillary 130 (H) 70 - 99 mg/dL  Glucose, capillary  Result Value Ref Range   Glucose-Capillary 118 (H) 70 - 99 mg/dL  Glucose, capillary  Result Value Ref Range   Glucose-Capillary 147 (H) 70 - 99 mg/dL  Glucose, capillary  Result Value Ref Range   Glucose-Capillary 133 (H) 70 - 99 mg/dL  Glucose, capillary  Result Value Ref Range   Glucose-Capillary 152 (H) 70 - 99 mg/dL  Glucose, capillary  Result Value Ref Range   Glucose-Capillary 145 (H) 70 - 99 mg/dL  Glucose, capillary  Result Value Ref Range   Glucose-Capillary 138 (H) 70 - 99 mg/dL  Glucose, capillary  Result Value Ref Range   Glucose-Capillary 165 (H) 70 - 99  mg/dL  Glucose, capillary  Result Value Ref Range   Glucose-Capillary 154 (H) 70 - 99 mg/dL  Glucose, capillary  Result Value Ref Range   Glucose-Capillary 132 (H) 70 - 99 mg/dL  Glucose, capillary  Result Value Ref Range   Glucose-Capillary 207 (H) 70 - 99 mg/dL  Glucose, capillary  Result Value Ref Range   Glucose-Capillary 161 (H) 70 - 99 mg/dL   Comment 1 Notify RN    Comment 2 Document in Chart   Glucose, capillary  Result Value Ref Range   Glucose-Capillary 149 (H) 70 - 99 mg/dL   Comment 1 Notify RN    Comment 2 Document in Chart   Glucose, capillary  Result Value Ref Range   Glucose-Capillary 112 (H) 70 - 99 mg/dL   Comment 1 Notify RN    Comment 2 Document in Chart   Glucose, capillary  Result Value Ref Range   Glucose-Capillary 201 (H) 70 - 99 mg/dL  Glucose, capillary  Result Value Ref Range   Glucose-Capillary 129 (H) 70 - 99 mg/dL  Glucose, capillary  Result Value Ref Range   Glucose-Capillary 157 (H) 70 - 99 mg/dL  Glucose, capillary  Result Value Ref Range   Glucose-Capillary 127 (H) 70 - 99 mg/dL   Comment 1 Notify RN    Comment 2 Document in Chart   Glucose, capillary  Result Value Ref Range   Glucose-Capillary 134 (H) 70 - 99 mg/dL   Comment 1 Notify RN    Comment 2 Document in Chart   Glucose, capillary  Result Value Ref Range   Glucose-Capillary 131 (H) 70 - 99 mg/dL   Comment 1 Notify RN    Comment 2 Document in Chart   Glucose, capillary  Result Value Ref Range   Glucose-Capillary 145 (H) 70 - 99 mg/dL  Glucose, capillary  Result Value Ref Range   Glucose-Capillary 126 (H) 70 - 99 mg/dL   Comment 1 Notify RN    Comment 2 Document in Chart   Glucose, capillary  Result Value Ref Range   Glucose-Capillary 76 70 -  99 mg/dL  Glucose, capillary  Result Value Ref Range   Glucose-Capillary 107 (H) 70 - 99 mg/dL  I-stat Creatinine, ED  Result Value Ref Range   Creatinine, Ser 1.10 0.61 - 1.24 mg/dL  I-STAT 7, (LYTES, BLD GAS, ICA, H+H)   Result Value Ref Range   pH, Arterial 7.337 (L) 7.350 - 7.450   pCO2 arterial 44.2 32.0 - 48.0 mmHg   pO2, Arterial 436.0 (H) 83.0 - 108.0 mmHg   Bicarbonate 24.1 20.0 - 28.0 mmol/L   TCO2 25 22 - 32 mmol/L   O2 Saturation 100.0 %   Acid-base deficit 2.0 0.0 - 2.0 mmol/L   Sodium 138 135 - 145 mmol/L   Potassium 3.6 3.5 - 5.1 mmol/L   Calcium, Ion 1.17 1.15 - 1.40 mmol/L   HCT 32.0 (L) 39.0 - 52.0 %   Hemoglobin 10.9 (L) 13.0 - 17.0 g/dL   Patient temperature 16.1 C    Sample type ARTERIAL   Type and screen [facility]  Result Value Ref Range   ABO/RH(D) A POS    Antibody Screen NEG    Sample Expiration      07/16/2018 Performed at [facility], [address], [place], [place] [address]   ABO/Rh  Result Value Ref Range   ABO/RH(D)      A POS Performed at [facility], [address], [place], [place] [address]     Ct Head Wo Contrast  Result Date: 07/14/2018 CLINICAL DATA:  Subdural hematoma. Repeat craniotomy yesterday for subdural evacuation EXAM: CT HEAD WITHOUT CONTRAST TECHNIQUE: Contiguous axial images were obtained from the base of the skull through the vertex without intravenous contrast. COMPARISON:  CT head 07/11/2018, 07/08/2018 FINDINGS: Brain: Interval partial vacuo a shin of subdural hematoma on the left. Decreased size of acute high-density hemorrhage. Increased subdural gas. Interval placement of subdural drain in good position in the subdural space extending anteriorly. Mass-effect and 6 mm midline shift to the right has improved following evacuation of subdural hematoma. Decreased edema in the left hemisphere. No acute infarct.  Negative for hydrocephalus. Vascular: Negative for hyperdense vessel Skull: Left craniotomy.  Craniotomy flap in good position. Sinuses/Orbits: Mucosal edema paranasal sinuses. Feeding tube through the right nose. Other: None IMPRESSION: Interval redo craniotomy for subdural evacuation. Decreased subdural  blood on the left with subdural drain now in place. Decreased mass-effect and midline shift now 6 mm. Electronically Signed   By: Marlan Palau M.D.   On: 07/14/2018 08:23   Ct Head Wo Contrast  Result Date: 07/11/2018 CLINICAL DATA:  Followup subdural hematoma. EXAM: CT HEAD WITHOUT CONTRAST TECHNIQUE: Contiguous axial images were obtained from the base of the skull through the vertex without intravenous contrast. COMPARISON:  07/08/2018 FINDINGS: Brain: Left-sided subdural drain has been removed. The overall volume blood and air in the left convexity subdural space is very slightly diminished when measured in multiple locations. No evidence of additional bleeding. Maximal thickness remains approximately 2.4 cm in the frontal region. Mass effect persists, with left-to-right shift measuring up to 14 mm today, increased from maximal measurement of 13 mm previously. There is flattening of the left lateral ventricle. No definite trapping of the right lateral ventricle. No evidence brain infarction. No new finding. Vascular: There is atherosclerotic calcification of the major vessels at the base of the brain. Skull: Left frontoparietal craniotomy. Sinuses/Orbits: Clear except for mild seasonal mucosal thickening and a few retention cysts. Orbits negative. Other: None IMPRESSION: Left subdural drain  has been removed. Persistent subdural blood and air on the left, possibly very mildly diminished. No evidence of worsening or new bleeding. Continued mass effect with left-to-right shift of approximately 14 mm, perhaps a mm increased from the previous study. Maximal thickness of the subdural collection in the frontal region measures 2.4 cm. Electronically Signed   By: Paulina Fusi M.D.   On: 07/11/2018 13:54   Ct Head Wo Contrast  Result Date: 07/08/2018 CLINICAL DATA:  Follow-up subdural hematoma EXAM: CT HEAD WITHOUT CONTRAST TECHNIQUE: Contiguous axial images were obtained from the base of the skull through the  vertex without intravenous contrast. COMPARISON:  Yesterday FINDINGS: Brain: Interval decompression of subdural hematoma via craniotomy. Subdural drain is present. The collection has decreased in size, where residual high-density blood products, gas, and hemostatic material cause a residual collection measuring up to 2 cm in thickness at the frontal convexity. Midline shift has decreased to 1 cm. No entrapment or acute infarct is noted. Vascular: Atherosclerotic calcification Skull: Unremarkable craniotomy Sinuses/Orbits: Negative IMPRESSION: Interval decompression of left subdural hematoma. The collection measures up to 2 cm. Midline shift has improved to 1 cm. Electronically Signed   By: Marnee Spring M.D.   On: 07/08/2018 05:36   Dg Chest Port 1 View  Result Date: 07/14/2018 CLINICAL DATA:  Check gastric catheter placement EXAM: PORTABLE CHEST 1 VIEW COMPARISON:  07/08/2018 FINDINGS: Cardiac shadow is stable. The lungs are well aerated bilaterally without focal infiltrate. Feeding catheter is noted with the tip in the mid to distal esophagus. This should be advanced several cm. IMPRESSION: Feeding catheter within the mid to distal esophagus. Electronically Signed   By: Alcide Clever M.D.   On: 07/14/2018 15:36   Dg Chest Port 1 View  Result Date: 07/08/2018 CLINICAL DATA:  Status post extubation.  Hypertension. EXAM: PORTABLE CHEST 1 VIEW COMPARISON:  July 07, 2018 FINDINGS: Endotracheal tube and nasogastric tube have been removed. No pneumothorax. There is no appreciable edema or consolidation. Heart is upper normal in size with pulmonary vascularity normal. No adenopathy. No bone lesions. IMPRESSION: No pneumothorax. No edema or consolidation. Heart upper normal in size. Electronically Signed   By: Bretta Bang III M.D.   On: 07/08/2018 07:46   Dg Chest Port 1 View  Result Date: 07/07/2018 CLINICAL DATA:  ET tube present EXAM: PORTABLE CHEST 1 VIEW COMPARISON:  Portable film earlier today.  FINDINGS: Tubes and lines remain stable. No consolidation or edema. Improved LEFT lower lobe atelectasis. IMPRESSION: Stable to improved chest. Electronically Signed   By: Elsie Stain M.D.   On: 07/07/2018 07:26   Dg Chest Portable 1 View  Result Date: 07/07/2018 CLINICAL DATA:  Post intubation and OG tube placement. Hx of HTN and current smoker. EXAM: PORTABLE CHEST 1 VIEW COMPARISON:  None. FINDINGS: Endotracheal tube tip projects 4.9 cm above the carina. Nasal/orogastric tube passes below the diaphragm and below the included field of view. Cardiac silhouette is normal in size. No mediastinal or hilar masses. There are prominent bronchovascular markings most evident at the bases with additional lung base opacity at likely due to atelectasis. No convincing pneumonia. No evidence of pulmonary edema. No pleural effusion or pneumothorax. Skeletal structures are grossly intact. IMPRESSION: 1. Endotracheal tube tip projects 4.9 cm above the carina. Nasal/orogastric tube passes below the diaphragm into the stomach. 2. No acute cardiopulmonary disease. Electronically Signed   By: Amie Portland M.D.   On: 07/07/2018 02:45   Dg Abd Portable 1v  Result Date: 07/14/2018 CLINICAL  DATA:  Check gastric catheter placement EXAM: PORTABLE ABDOMEN - 1 VIEW COMPARISON:  Chest x-ray from earlier in the same day. FINDINGS: Scattered large and small bowel gas is noted. Feeding catheter is noted coiled within the stomach advanced when compared with the prior exam. IMPRESSION: Feeding catheter coiled within the stomach. Electronically Signed   By: Alcide Clever M.D.   On: 07/14/2018 15:37   Dg Swallowing Func-speech Pathology  Result Date: 07/15/2018 Objective Swallowing Evaluation: Type of Study: MBS-Modified Barium Swallow Study  Patient Details Name: Michael Mercado MRN: [id number] Date of Birth: [birth year] Today's Date: 07/15/2018 Time: SLP Start Time (ACUTE ONLY): 1105 -SLP Stop Time (ACUTE ONLY): 1125 SLP Time  Calculation (min) (ACUTE ONLY): 20 min Past Medical History: Past Medical History: Diagnosis Date . Hypercholesteremia  . Hypertension  . Moderate intellectual disability  . Mood disorder (HCC)  . MR (mental retardation)  . Obesity  Past Surgical History: Past Surgical History: Procedure Laterality Date . CRANIOTOMY Left 07/07/2018  Procedure: CRANIOTOMY HEMATOMA EVACUATION SUBDURAL;  Surgeon: Tia Alert, MD;  Location: [facility] OR;  Service: Neurosurgery;  Laterality: Left; . CRANIOTOMY Left 07/13/2018  Procedure: Redo Left CRANIOTOMY FOR SUBDURAL HEMATOMA;  Surgeon: Tia Alert, MD;  Location: [facility] OR;  Service: Neurosurgery;  Laterality: Left; HPI: Pt is a 78 year old male with history of moderate intellectual disablity, tobacco abuse, COPD,  HTN, HLD, CKD stage II who [facility] to [facility] after fall.  Apparently he slipped and fell getting out of shower but denied LOC.  He was evaluated, asymptomatic, and sent home but then returned on 07/07/18 after additional fall from bed around 0115. At that time he was unresponsive with right facial droop and hypertensive. Code stroke activated. CT head showed large subdural hematoma 19 mm of left-to-right shift.  He was intubated for airway protection and transferred emergently to [facility] for Neurosurgery evaluation. Left craniotomy was completed on 07/07/18 for exacuation and pt was extubated following surgery. Re-do craniotomy 3/25.   No data recorded Assessment / Plan / Recommendation CHL IP CLINICAL IMPRESSIONS 07/15/2018 Clinical Impression Pt exhibits an acute on (suspected) chronic dysphagia from sensorimotor deficits and anatomical differences leading to reduced initiation, contraction and laryngeal closure. Inconsistent initiation of swallow at the pyriform sinuses and silent (trace-mild) penetration (PAS 2,3) before complete laryngeal closure was achieved with thin. Pt unable to produce effective volitional cough or throat clear. Mild vallecular  residue originating from lingual residue and decreased pharyngeal contraction consistent throughout study. Cervical vertebrae has a kyphotic appearance in addition to large/fused bony protrusion in lower cervical esophagus near cricopharyngeus possibly affecting complete UES function. Pt noted to belch/slightly gag early in the study and esophageal scanned revealed copious stasis in upper esophagus that was reduced but not cleared given additional time to transit. Thin liquids are safer for esophageal phase of deglutition however aspiration risk with thin is high if precautions not followed/no supervision and large sips consumed. To reintroduce po's, recommend initiaton of puree texture (no dentition) and nectar thick liquids with trials of thin using strategies with ST only. Pt should remain upright 30 min after meals, crush meds, cup sips preferred, small sips and cues to cough/throat clear if pt able.      SLP Visit Diagnosis Dysphagia, oropharyngeal phase (R13.12) Attention and concentration deficit following -- Frontal lobe and executive function deficit following -- Impact on safety and function Moderate aspiration risk   CHL IP TREATMENT RECOMMENDATION 07/15/2018 Treatment Recommendations Therapy as outlined in  treatment plan below   Prognosis 07/15/2018 Prognosis for Safe Diet Advancement (No Data) Barriers to Reach Goals Cognitive deficits;Other (Comment) Barriers/Prognosis Comment -- CHL IP DIET RECOMMENDATION 07/15/2018 SLP Diet Recommendations Dysphagia 1 (Puree) solids;Nectar thick liquid Liquid Administration via Cup Medication Administration Crushed with puree Compensations Minimize environmental distractions;Slow rate;Small sips/bites;Clear throat intermittently;Hard cough after swallow Postural Changes Remain semi-upright after after feeds/meals (Comment);Seated upright at 90 degrees   CHL IP OTHER RECOMMENDATIONS 07/15/2018 Recommended Consults -- Oral Care Recommendations Oral care BID Other  Recommendations --   CHL IP FOLLOW UP RECOMMENDATIONS 07/15/2018 Follow up Recommendations [facility] D[facility] 07/15/2018 Speech Therapy Frequency (ACUTE ONLY) min 2x/week Treatment Duration 2 weeks      CHL IP ORAL PHASE 07/15/2018 Oral Phase Impaired Oral - Pudding Teaspoon -- Oral - Pudding Cup -- Oral - Honey Teaspoon -- Oral - Honey Cup -- Oral - Nectar Teaspoon Lingual/palatal residue Oral - Nectar Cup Lingual/palatal residue Oral - Nectar Straw Lingual/palatal residue Oral - Thin Teaspoon WFL Oral - Thin Cup Lingual/palatal residue Oral - Thin Straw -- Oral - Puree Lingual/palatal residue Oral - Mech Soft -- Oral - Regular -- Oral - Multi-Consistency -- Oral - Pill -- Oral Phase - Comment --  CHL IP PHARYNGEAL PHASE 07/15/2018 Pharyngeal Phase Impaired Pharyngeal- Pudding Teaspoon -- Pharyngeal -- Pharyngeal- Pudding Cup -- Pharyngeal -- Pharyngeal- Honey Teaspoon -- Pharyngeal -- Pharyngeal- Honey Cup -- Pharyngeal -- Pharyngeal- Nectar Teaspoon Delayed swallow initiation-pyriform sinuses;Pharyngeal residue - valleculae Pharyngeal -- Pharyngeal- Nectar Cup Pharyngeal residue - valleculae;Delayed swallow initiation-pyriform sinuses Pharyngeal -- Pharyngeal- Nectar Straw Pharyngeal residue - valleculae Pharyngeal -- Pharyngeal- Thin Teaspoon Penetration/Aspiration during swallow;Penetration/Aspiration before swallow Pharyngeal Material enters airway, remains ABOVE vocal cords then ejected out;Material enters airway, remains ABOVE vocal cords and not ejected out Pharyngeal- Thin Cup -- Pharyngeal -- Pharyngeal- Thin Straw -- Pharyngeal -- Pharyngeal- Puree Pharyngeal residue - valleculae Pharyngeal -- Pharyngeal- Mechanical Soft -- Pharyngeal -- Pharyngeal- Regular -- Pharyngeal -- Pharyngeal- Multi-consistency -- Pharyngeal -- Pharyngeal- Pill -- Pharyngeal -- Pharyngeal Comment --  [facility] IP CERVICAL ESOPHAGEAL PHASE 07/15/2018 Cervical Esophageal Phase (No Data) Pudding Teaspoon  -- Pudding Cup -- Honey Teaspoon -- Honey Cup -- Nectar Teaspoon -- Nectar Cup -- Nectar Straw -- Thin Teaspoon -- Thin Cup -- Thin Straw -- Puree -- Mechanical Soft -- Regular -- Multi-consistency -- Pill -- Cervical Esophageal Comment -- Royce MacadamiaLitaker, Lisa Willis 07/15/2018, 2:44 PM Breck CoonsLisa Willis Litaker M.Ed Sports administratorCCC-SLP Speech-Language Pathologist Pager [phone] Office [phone]              Ct Head Code Stroke Wo Contrast  Result Date: 07/07/2018 CLINICAL DATA:  Code stroke. Initial evaluation for acute sudden onset weakness, prior fall. EXAM: CT HEAD WITHOUT CONTRAST TECHNIQUE: Contiguous axial images were obtained from the base of the skull through the vertex without intravenous contrast. COMPARISON:  None. FINDINGS: Brain: Large mixed attenuation left subdural hemorrhage overlies the left cerebral convexity, measuring up to 2.8 cm in maximal diameter at the left frontal convexity. Scattered acute blood products present within this collection. Associated mass effect on the subjacent left cerebral hemisphere with up to 19 mm of left-to-right shift. Left lateral ventricle partially effaced. Asymmetric dilatation of the right lateral ventricle concerning for ventricular trapping. Basilar cisterns partially effaced but remain patent at this time. No other acute intracranial hemorrhage. No large vessel territory infarct. No appreciable mass lesion. Vascular: No hyperdense vessel. Skull: Oval small scalp contusion at the left occipital scalp. No  calvarial fracture. Sinuses/Orbits: Globes and orbital soft tissues demonstrate no acute finding. Paranasal sinuses and mastoid air cells are clear. Other: None. IMPRESSION: 1. Large mixed attenuation left holo hemispheric subdural hematoma measuring up to 2.8 cm in maximal diameter. Associated extensive mass effect on the subjacent left cerebral hemisphere with up to 19 mm of left-to-right shift. Emergent neuro surgical consultation recommended. 2. Asymmetric dilatation of the right  lateral ventricle, concerning for early ventricular trapping. 3. Small left occipital scalp contusion.  No calvarial fracture. Critical Value/emergent results were called by telephone at the time of interpretation on 07/07/2018 at 2:07 am to Dr. Marily Memos , who verbally acknowledged these results. Electronically Signed   By: Rise Mu M.D.   On: 07/07/2018 02:14    Antibiotics:  Anti-infectives (From admission, onward)   Start     Dose/Rate Route Frequency Ordered Stop   07/13/18 1900  ceFAZolin (ANCEF) IVPB 1 g/50 mL premix     1 g 100 mL/hr over 30 Minutes Intravenous Every 8 hours 07/13/18 1509 07/14/18 0246   07/13/18 1106  bacitracin 50,000 Units in sodium chloride 0.9 % 500 mL irrigation  Status:  Discontinued       As needed 07/13/18 1106 07/13/18 1257   07/13/18 0730  ceFAZolin (ANCEF) IVPB 2g/100 mL premix     2 g 200 mL/hr over 30 Minutes Intravenous On call to O.R. 07/13/18 0716 07/13/18 1134   07/07/18 1200  ceFAZolin (ANCEF) IVPB 1 g/50 mL premix     1 g 100 mL/hr over 30 Minutes Intravenous Every 8 hours 07/07/18 0640 07/07/18 2107      Discharge Exam: Blood pressure (!) 143/76, pulse 60, temperature 98 F (36.7 C), resp. rate 20, height 5' 6.25" (1.683 m), weight 73.4 kg, SpO2 98 %.   On exam he is easily arousable and follows simple commands and uses simple words.  He seems to move all extremities.  Incision is clean dry and intact.  Discharge Medications:   Allergies as of 07/19/2018   No Known Allergies     Medication List    STOP taking these medications   aspirin 81 MG EC tablet   ibuprofen 400 MG tablet Commonly known as:  ADVIL,MOTRIN     TAKE these medications   AFTER BITE EX Apply topically as needed.   amLODipine 10 MG tablet Commonly known as:  NORVASC Take 1 tablet (10 mg total) by mouth daily.   Breo Ellipta 100-25 MCG/INH Aepb Generic drug:  fluticasone furoate-vilanterol INHALE 1 PUFF ONCE DAILY. What changed:  See the new  instructions.   clonazePAM 0.5 MG tablet Commonly known as:  KLONOPIN Take 0.5 mg by mouth daily as needed for anxiety.   Complete Allergy Medicine 25 mg capsule Generic drug:  diphenhydrAMINE TAKE 1 CAPSULE BY MOUTH DAILY AS NEEDED FOR ALLERGY SYMPTOMS. CONTACTNURSE IF SYMPTOMS WORSEN. What changed:  See the new instructions.   COPPERTONE SPORT SPF30 EX Apply topically.   hydrocortisone cream 1 % APPLY TO THE AFFECTED AREA(s) THREE TIMES DAILY. What changed:  See the new instructions.   LIP BALM BASE EX Apply topically.   loperamide 2 MG capsule Commonly known as:  IMODIUM Take by mouth as needed for diarrhea or loose stools. What changed:  Another medication with the same name was changed. Make sure you understand how and when to take each.   loperamide 2 MG capsule Commonly known as:  IMODIUM TAKE 2 CAPSULES BY MOUTH AS NEEDED AFTER 2ND LOOSE STOOL REPEAT AFTERNEXT  LOOSE STOOL MAX 3 DOSES, NOTIFY NURSE. What changed:  See the new instructions.   loperamide 2 MG capsule Commonly known as:  IMODIUM TAKE 2 CAPSULES BY MOUTH AS NEEDED AFTER 2ND LOOSE STOOL. TAKE 1 CAPSULE AFTER NEXT LOOSE STOOL MAX 3 DOSES, NOTIFY NURSE. What changed:  Another medication with the same name was changed. Make sure you understand how and when to take each.   LUBRICATING LOTION EX Apply topically 2 (two) times daily.   Minerin Lotn APPLY TWICE DAILY   Mapap 500 MG tablet Generic drug:  acetaminophen TAKE 2 TABS BY MOUTH EVERY 4 HRS AS NEEDED FOR HEADACHE/MILD TO MODERATE PAIN OR TEMP OF 100.3 & ABOVE IF TEMP UNRESOLVED AFTER 24 HRS MUST What changed:  See the new instructions.   PARoxetine 20 MG tablet Commonly known as:  PAXIL Take 20 mg by mouth at bedtime.   QC Antacid/Anti-Gas 200-200-20 MG/5ML suspension Generic drug:  alum & mag hydroxide-simeth TAKE AS NEEDED FOR UPSET STOMACH. NO MORE THAN 3 DOSES IN 24 HOURS. NOTIFY NURSE IF SYMPTOMS PERSIST. What changed:  See the new  instructions.   QC Milk of Magnesia 400 MG/5ML suspension Generic drug:  magnesium hydroxide TAKE (2 TBLS)AS NEEDED AFTER 3 DAYS NO BM MAY REPEAT X3 DOSES IFNO RESOLUTION CALL NURSE (CONSTIPATION) What changed:  See the new instructions.   risperiDONE 1 MG tablet Commonly known as:  RISPERDAL Take 1 mg by mouth 2 (two) times daily.   Triple Antibiotic [phone] Oint MINOR CUT/ABRASION:CLEAN W/ MILD SOAP/WATER THEN APPLY THIN LAYER TO AFFECTED AREA MAY COVER W/ BANDAID REMOVE AFTER 24 HRS NOTIFY NURSE OF What changed:  See the new instructions.   Tussin 100 MG/5ML syrup Generic drug:  guaifenesin TAKE (2TSP) BY MOUTH EVERY 6 HOURS AS NEEDED FOR COUGH/COLD MAX4 DOSES IN 24 HRS, NOTIFY NURSE IF PERSISTS. What changed:  See the new instructions.   Ventolin HFA 108 (90 Base) MCG/ACT inhaler Generic drug:  albuterol INHALE (2) PUFFS BY MOUTH 4 TIMES DAILY AS NEEDED FOR SHORTNESS OF BREATH. What changed:  See the new instructions.            Discharge Care Instructions  (From admission, onward)         Start     Ordered   07/19/18 0000  Discharge wound care:    Comments:  Remove staples in 1 week   07/19/18 1324          Disposition: CIR   Final Dx: Craniotomy for left subdural hematoma  Discharge Instructions    Diet - low sodium heart healthy   Complete by:  As directed    Discharge wound care:   Complete by:  As directed    Remove staples in 1 week   Increase activity slowly   Complete by:  As directed       Follow-up Information    Tia Alert, MD. Schedule an appointment as soon as possible for a visit in 2 week(s).   Specialty:  Neurosurgery Contact information: [address] [place] [address] [phone]            Signed: Tia Alert 07/19/2018, 1:24 PM

## 2018-07-19 NOTE — Progress Notes (Signed)
Michael Mercado, Michael Lueth M, RN  Rehab Admission Coordinator  Physical Medicine and Rehabilitation  PMR Pre-admission  Signed  Date of Service:  07/19/2018 11:40 AM       Related encounter: ED to Hosp-Admission (Current) from 07/07/2018 in [place] [facility]      Signed         Show:Clear all [x] Manual[x] Template[x] Copied  Added by: [x] Michael Mercado, Michael NewerEugenia M, RN  [] Hover for details PMR Admission Coordinator Pre-Admission Assessment  Patient: Michael Mercado is an 78 y.o., male MRN: [id number] DOB: 10/12/1940 Height: 5' 6.25" (168.3 cm) Weight: 73.4 kg                                                                                                                                                  Insurance Information HMO: No    PPO:       PCP:       IPA:       80/20:       OTHER:   PRIMARY:  Medicare A and B      Policy#: [id number]      Subscriber: patient CM Name:        Phone#:       Fax#:   Pre-Cert#:        Employer: Not employed/disabled Benefits:  Phone #:       Name: Checked in Passport One Source Eff. Date: 10/19/71     Deduct: $1408      Out of Pocket Max: nne      Life Max: N/A CIR: 100%      SNF: 100 days Outpatient: 80%     Co-Pay: 20% Home Health: 100%      Co-Pay: none DME: 80%     Co-Pay: 20% Providers: patient's choice  SECONDARY: Medicaid of [place]      Policy#: [id number]      Subscriber: patient CM Name:        Phone#:       Fax#:   Pre-Cert#:        Employer: Not employed/disabled Benefits:  Phone #: [phone]     Name: Automated Eff. Date: Eligible 07/14/18 with coverage code [id number]     Deduct:        Out of Pocket Max:        Life Max:   CIR:        SNF:   Outpatient:       Co-Pay:   Home Health:        Co-Pay:   DME:       Co-Pay:    Medicaid Application Date:        Case Manager:   Disability Application Date:        Case Worker:    The Data Collection Information Summary for patients in Inpatient Rehabilitation Facilities with  attached Privacy  Act Statement-Health Care Records was provided and verbally reviewed with: Family.  Reviewed with niece, Michael Mercado [facility]   Emergency Contact Information         Contact Information    Name Relation Home Work Mobile   Michael Mercado,Group Home Other [phone]     Michael Mercado Niece [phone]       Current Medical History  Patient Admitting Diagnosis:  L SDH after fall  History of Present Illness: A 77 year old right-handed male with history of hypertension on Norvasc 10 mg daily, hyperlipidemia, mild mental retardation with anxiety/schizophrenia maintained on Klonopin 0.5 mg daily as needed, Risperdal 1 mg twice a day as well as Paxil 20 mg daily at bedtime and also history of tobacco abuse. Per report [facility]. He was able to manage basic ADLs prior to admissionand was also doing some simple volunteer work. Presented 07/07/2018 after a fall getting out of the shower. Per report patient did strike his head but did not lose consciousness. Cranial CT scan showed a large mixed attenuation left whole low hemispheric subdural hematoma measuring 2.8 cm in maximal diameter with associated extensive mass effect and left-to-right midline shift. Underwent left craniotomy for evacuation of subdural hematoma 07/07/2018 per Dr. Marikay Alar. He was extubated 07/07/2018 and maintained on Keppra for seizure prophylaxis. On 07/13/2018 patient more difficult to arouse was not moving his right arm as well as some increased facial weakness. Follow-up neurosurgery felt need for reexploration and underwent redo left craniotomy for evacuation of acute subdural hematoma 07/13/2018. Patient did require nasogastric tube for nutritional support. His diet has been advanced to dysphagia #1 thin liquid. Follow-up cranial CT scan 07/14/2018 showed decreased subdural blood on the left with subdural drain. Subdural drain has been  removed. Decreased mass effect and midline shift now 6 mm as compared to 13 mm on prior tracings. Patient's level of alertness has continued to improve. Therapy evaluations ongoing with recommendations of physical medicine rehabilitation consult. Patient to be admitted for a comprehensive inpatient rehabilitation program.   Complete NIHSS TOTAL: 34 Glasgow Coma Scale Score: 15  Past Medical History      Past Medical History:  Diagnosis Date   Hypercholesteremia    Hypertension    Moderate intellectual disability    Mood disorder (HCC)    MR (mental retardation)    Obesity     Family History  family history includes Stroke in his sister.  Prior Rehab/Hospitalizations: No recent hospitalizations and no recent rehab.  Has the patient had prior rehab or hospitalizations prior to admission? No  Has the patient had major surgery during 100 days prior to admission? Yes.  Had craniotomy twice during acute hospital stay prior to rehab.  Current Medications   Current Facility-Administered Medications:    0.9 % NaCl with KCl 20 mEq/ L  infusion, , Intravenous, Continuous, Tia Alert, MD, Last Rate: 50 mL/hr at 07/19/18 0600   acetaminophen (TYLENOL) tablet 650 mg, 650 mg, Oral, Q4H PRN, 650 mg at 07/15/18 0327 **OR** acetaminophen (TYLENOL) suppository 650 mg, 650 mg, Rectal, Q4H PRN, Tia Alert, MD   chlorhexidine (PERIDEX) 0.12 % solution 15 mL, 15 mL, Mouth Rinse, BID, Tia Alert, MD, 15 mL at 07/19/18 0933   hydrALAZINE (APRESOLINE) injection 10 mg, 10 mg, Intravenous, Q4H PRN, Tia Alert, MD, 10 mg at 07/17/18 2956   labetalol (NORMODYNE,TRANDATE) injection 10-40 mg, 10-40 mg, Intravenous, Q10 min PRN, Tia Alert,  MD, 20 mg at 07/17/18 0110   levETIRAcetam (KEPPRA) IVPB 500 mg/100 mL premix, 500 mg, Intravenous, Q12H, Tia Alert, MD, Last Rate: 400 mL/hr at 07/19/18 0900, 500 mg at 07/19/18 0900   MEDLINE mouth rinse, 15 mL, Mouth  Rinse, q12n4p, Tia Alert, MD, 15 mL at 07/18/18 1849   morphine 2 MG/ML injection 1-2 mg, 1-2 mg, Intravenous, Q2H PRN, Tia Alert, MD   ondansetron [facility]) tablet 4 mg, 4 mg, Oral, Q4H PRN **OR** ondansetron (ZOFRAN) injection 4 mg, 4 mg, Intravenous, Q4H PRN, Tia Alert, MD   pantoprazole sodium (PROTONIX) 40 mg/20 mL oral suspension 40 mg, 40 mg, Per Tube, QHS, Tia Alert, MD, 40 mg at 07/18/18 2128   Resource ThickenUp Clear, , Oral, PRN, Tia Alert, MD   senna Mancel Parsons) tablet 8.6 mg, 1 tablet, Oral, BID, Tia Alert, MD, 8.6 mg at 07/19/18 3704  Patients Current Diet:     Diet Order                  DIET - DYS 1 Room service appropriate? No; Fluid consistency: Thin  Diet effective now               Precautions / Restrictions Precautions Precautions: Fall Precaution Comments: Rt inattention Restrictions Weight Bearing Restrictions: No   Has the patient had 2 or more falls or a fall with injury in the past year?Yes.  Had one fall in the shower which resulted in current L SDH with craniotomy.  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  Went out daily.  A staff member goes with him.  He does not drive.  Prior Functional Level Prior Function Comments: Not a lot of information available re: PLOF at time of writing this note, except that normally Qu[place]von is social and talkative; It is likely he was walking independently, and managing his basic ADLs in the group home setting  Self Care: Did the patient need help bathing, dressing, using the toilet or eating?  Independent  Indoor Mobility: Did the patient need assistance with walking from room to room (with or without device)? Independent  Stairs: Did the patient need assistance with internal or external stairs (with or without device)? Independent  Functional Cognition: Did  the patient need help planning regular tasks such as shopping or remembering to take medications? Needed some help.  Group home attendants do shopping and they assist with medication administration.  Home Assistive Devices / Equipment Home Assistive Devices/Equipment: None  Prior Device Use: Indicate devices/aids used by the patient prior to current illness, exacerbation or injury? None  Current Functional Level Cognition  Overall Cognitive Status: No family/caregiver present to determine baseline cognitive functioning Difficult to assess due to: Level of arousal Current Attention Level: Sustained Orientation Level: Oriented to person, Oriented to place Following Commands: Follows one step commands consistently Safety/Judgement: Decreased awareness of deficits, Decreased awareness of safety General Comments: pt able to state name, "hospital" and stating Yeah and uh uh during session. Pt following commands for transfers and able to state need for toilet paper after toileting.     Extremity Assessment (includes Sensation/Coordination)  Upper Extremity Assessment: RUE deficits/detail RUE Deficits / Details: no spontaneous movement noted.   RUE Coordination: decreased fine motor, decreased gross motor  Lower Extremity Assessment: Defer to PT evaluation RLE Deficits / Details: Slow to move RLE when asked to,  but he did participate in lifting RLE for sock donning; Hip, knee, and ankle ROM WFL for dunctional mobiltiy tasks performed today; Noted adductor stiffness bilaterally, seemingly R greater than L; noted bil LE muscle recruitment in weight bearing response for standing when center of mass was shifted over feet LLE Deficits / Details: participated in lifting LLE for sock donning; Hip, knee, and ankle ROM WFL for dunctional mobiltiy tasks performed today; Noted adductor stiffness bilaterally, seemingly R greater than L; noted bil LE muscle recruitment in weight bearing response for standing  when center of mass was shifted over feet    ADLs  Overall ADL's : Needs assistance/impaired Eating/Feeding: Supervision/ safety, Sitting Eating/Feeding Details (indicate cue type and reason): drinking water from cup with straw--constant verbal cues to take small sips Grooming: Set up, Supervision/safety, Sitting, Wash/dry face Grooming Details (indicate cue type and reason): using LUE--VCs to wash left side of face Upper Body Bathing: Total assistance, Sitting Lower Body Bathing: Total assistance, Sit to/from stand Upper Body Dressing : Total assistance, Sitting Lower Body Dressing: Total assistance, Sit to/from stand Toilet Transfer: Minimal assistance, Ambulation Toilet Transfer Details (indicate cue type and reason): bed>around to recliner on other side of bed ; needed more A for manuvering RW than with actual balance Toileting- Clothing Manipulation and Hygiene: Total assistance, Sit to/from stand Functional mobility during ADLs: Maximal assistance, Moderate assistance, +2 for physical assistance, +2 for safety/equipment General ADL Comments: Pt total A for all basic ADLs. Attempted hand over hand with pt washing his face with LUE while seated EOB, but pt guarding my A to help him. (earlier while supine in bed pt reached up with LUE to wipe at nose--so the movement is there)    Mobility  Overal bed mobility: Needs Assistance Bed Mobility: Supine to Sit Rolling: Mod assist Sidelying to sit: Mod assist Supine to sit: Min assist General bed mobility comments: pt able to roll to left and initiate rise from surface but required increased assist to elevate trunk from surface today with increased time, HOB 30 degrees    Transfers  Overall transfer level: Needs assistance Equipment used: Rolling walker (2 wheeled) Transfers: Sit to/from Stand Sit to Stand: Min assist Stand pivot transfers: Mod assist General transfer comment: cues for hand placement and safety with only close  guarding and cues needed to stand from bed and toilet this session.     Ambulation / Gait / Stairs / Wheelchair Mobility  Ambulation/Gait Ambulation/Gait assistance: Min assist, +2 safety/equipment Gait Distance (Feet): 300 Feet Assistive device: 1 person hand held assist Gait Pattern/deviations: Step-through pattern, Decreased stride length, Trunk flexed, Narrow base of support General Gait Details: pt with improved balance and gait with RUE HHA with +2 for lines. pt maintains right inattention and inability to wayfind. Pt with increased assist and decreased gait speed as pt fatigues. Pt unable to self assess fatigue and needs cues to attend to task and turn around. Gait velocity interpretation: >2.62 ft/sec, indicative of community ambulatory    Posture / Balance Dynamic Sitting Balance Sitting balance - Comments: pt able to sit EOB and at toilet without assist Balance Overall balance assessment: Needs assistance Sitting-balance support: Feet supported, No upper extremity supported Sitting balance-Leahy Scale: Good Sitting balance - Comments: pt able to sit EOB and at toilet without assist Standing balance support: Bilateral upper extremity supported Standing balance-Leahy Scale: Fair Standing balance comment: pt able to stand without physical assist but requires UE support to maintain standing    Special needs/care  consideration BiPAP/CPAP No CPM No Continuous Drip IV 0.9% NS with KCL 20 meq/L 50 mL/hr Dialysis No       Life Vest No Oxygen No Special Bed No Trach Size No Wound Vac (area) No     Skin Has stapled left scalp incision.  Niece reports that patient picks at his skin.                            Bowel mgmt: Last BM 07/18/18 Bladder mgmt: External catheter Diabetic mgmt No Behavioral consideration:  Mild mental retardation with schizophrenia.  Has mittens on to keep from pulling IV, tubes out. Chemo/radiation  No    Previous Home Environment (from acute therapy  documentation) Living Arrangements: Group Home(Has his own room, alone.)  Lives With: Alone(Alone but in group home.  Has private room.) Available Help at Discharge: Other (Comment)(group home attendants) Type of Home: Group Home Care Facility [facility]: [facility] Home Layout: One level Home Access: Level entry Bathroom Shower/Tub: Walk-in shower, Curtain Bathroom Toilet: Handicapped height Bathroom Accessibility: Yes How Accessible: Accessible via wheelchair, Accessible via walker Additional Comments: Not a lot of infomation about group home available at time of writing this note  Discharge Living Setting Plans for Discharge Living Setting: Other (Comment)(From a group home - [facility]) Type of Home at Discharge: Group Home([facility]) Care Facility Name at Discharge: [facility] Discharge Home Layout: One level(One level house) Discharge Home Access: Level entry Discharge Bathroom Shower/Tub: Walk-in shower, Curtain Discharge Bathroom Toilet: Handicapped height Discharge Bathroom Accessibility: Yes How Accessible: Accessible via wheelchair, Accessible via walker Does the patient have any problems obtaining your medications?: No  Social/Family/Support Systems Patient Roles: Other (Comment)(Has a niece.) Contact Information: Tobie Poet - niece - [phone] Anticipated Caregiver: Staff at the group home Anticipated Caregiver's Contact Information: [name] Wilson - house parent - [phone] Ability/Limitations of Caregiver: Will need to be re-evaluated prior to return to group home by Janet Berlin with admissions at [phone] Caregiver Availability: Intermittent Discharge Plan Discussed with Primary Caregiver: Yes Is Caregiver In Agreement with Plan?: Yes Does Caregiver/Family have Issues with Lodging/Transportation while Pt is in Rehab?: No  Goals/Additional Needs Patient/Family Goal for Rehab: PT/OT/SLP supervision to min assist  goals Expected length of stay: 20-27 days Cultural Considerations: None Dietary Needs: Dys 1, thin liquids Equipment Needs: TBD Pt/Family Agrees to Admission and willing to participate: Yes(Spoke with niece, Michael Mercado, and with "house Parent") Program Orientation Provided & Reviewed with Pt/Caregiver Including Roles  & Responsibilities: Yes  Decrease burden of Care through IP rehab admission: Diet advancement, Decrease number of caregivers and Bowel and bladder program  Possible need for SNF placement upon discharge: Yes, but hopeful that patient will progress back to group home.  Will need to be re-evaluated from return to group home by Janet Berlin at [phone] with admissions.  Patient Condition: This patient's medical and functional status has changed since the consult dated: 07/11/18 in which the Rehabilitation Physician determined and documented that the patient's condition is appropriate for intensive rehabilitative care in an inpatient rehabilitation facility. See "History of Present Illness" (above) for medical update. Functional changes are: Currently requiring min assist for transfers and min assist (+2 for safety and equipment) to ambulate 300 feet +1 HHA . Patient's medical and functional status update has been discussed with the Rehabilitation physician and patient remains appropriate for inpatient rehabilitation. Will admit to inpatient rehab today.  Preadmission Screen Completed By:  Michael Mage, RN, 07/19/2018 12:06 PM ______________________________________________________________________   Discussed status with Dr. Riley Kill on 07/19/18 at 1205 and received approval for admission today.  Admission Coordinator:  Michael Mage, time 1205/Date 07/19/18         Cosigned by: Ranelle Oyster, MD at 07/19/2018 12:22 PM  Revision History

## 2018-07-19 NOTE — Progress Notes (Signed)
Physical Therapy Treatment Patient Details Name: Michael Mercado MRN: [id number] DOB: 1940-10-04 Today's Date: 07/19/2018    History of Present Illness 78 year old Caucasian male with intellectual disability, anxiety and schizophrenia presented with AMS/unresponsive and R sided neurologic deficits.  Found to have a large L SDH with midline shift. s/p L crani for evacuation of subdural hematoma. Recurrent Left SDH with additional crani 3/25    PT Comments    Pt pleasant in bed with tray beside him and mittens on. Pt more fatigued appearing today throughout session but continues to demonstrate increased verbalizations, activity tolerance and improved gait without use of Rw. Pt requires assist and cues for pericare and wayfinding. Will continue to follow with CIR recommended. REcommend daily mobility and toileting in bathroom with nursing.     Follow Up Recommendations  CIR;Supervision/Assistance - 24 hour     Equipment Recommendations  Rolling walker with 5" wheels    Recommendations for Other Services       Precautions / Restrictions Precautions Precautions: Fall Precaution Comments: Rt inattention    Mobility  Bed Mobility Overal bed mobility: Needs Assistance Bed Mobility: Supine to Sit     Supine to sit: Min assist     General bed mobility comments: pt able to roll to left and initiate rise from surface but required increased assist to elevate trunk from surface today with increased time, HOB 30 degrees  Transfers Overall transfer level: Needs assistance   Transfers: Sit to/from Stand Sit to Stand: Min assist         General transfer comment: cues for hand placement and safety with only close guarding and cues needed to stand from bed and toilet this session.   Ambulation/Gait Ambulation/Gait assistance: Min assist;+2 safety/equipment Gait Distance (Feet): 300 Feet Assistive device: 1 person hand held assist Gait Pattern/deviations: Step-through  pattern;Decreased stride length;Trunk flexed;Narrow base of support   Gait velocity interpretation: >2.62 ft/sec, indicative of community ambulatory General Gait Details: pt with improved balance and gait with RUE HHA with +2 for lines. pt maintains right inattention and inability to wayfind. Pt with increased assist and decreased gait speed as pt fatigues. Pt unable to self assess fatigue and needs cues to attend to task and turn around.   Stairs             Wheelchair Mobility    Modified Rankin (Stroke Patients Only) Modified Rankin (Stroke Patients Only) Pre-Morbid Rankin Score: No significant disability Modified Rankin: Moderately severe disability     Balance Overall balance assessment: Needs assistance Sitting-balance support: Feet supported;No upper extremity supported Sitting balance-Leahy Scale: Good       Standing balance-Leahy Scale: Fair Standing balance comment: pt able to stand without physical assist but requires UE support to maintain standing                            Cognition Arousal/Alertness: Awake/alert Behavior During Therapy: Flat affect Overall Cognitive Status: No family/caregiver present to determine baseline cognitive functioning Area of Impairment: Attention;Following commands;Safety/judgement;Awareness;Problem solving;Orientation                 Orientation Level: Time;Situation Current Attention Level: Sustained   Following Commands: Follows one step commands consistently Safety/Judgement: Decreased awareness of deficits;Decreased awareness of safety   Problem Solving: Slow processing;Decreased initiation;Requires verbal cues;Requires tactile cues General Comments: pt able to state name, "hospital" and stating Yeah and uh uh during session. Pt following commands for transfers and able  to state need for toilet paper after toileting.       Exercises      General Comments        Pertinent Vitals/Pain Pain  Assessment: No/denies pain    Home Living                      Prior Function            PT Goals (current goals can now be found in the care plan section) Progress towards PT goals: Progressing toward goals    Frequency           PT Plan Current plan remains appropriate    Co-evaluation              AM-PAC PT "6 Clicks" Mobility   Outcome Measure  Help needed turning from your back to your side while in a flat bed without using bedrails?: A Little Help needed moving from lying on your back to sitting on the side of a flat bed without using bedrails?: A Little Help needed moving to and from a bed to a chair (including a wheelchair)?: A Little Help needed standing up from a chair using your arms (e.g., wheelchair or bedside chair)?: A Little Help needed to walk in hospital room?: A Little Help needed climbing 3-5 steps with a railing? : A Lot 6 Click Score: 17    End of Session Equipment Utilized During Treatment: Gait belt Activity Tolerance: Patient tolerated treatment well Patient left: in chair;with call bell/phone within reach;with chair alarm set;with restraints reapplied(bil mittens) Nurse Communication: Mobility status PT Visit Diagnosis: Unsteadiness on feet (R26.81);Other abnormalities of gait and mobility (R26.89);Other symptoms and signs involving the nervous system (R29.898)     Time: 1638-4665 PT Time Calculation (min) (ACUTE ONLY): 25 min  Charges:  $Gait Training: 8-22 mins $Therapeutic Activity: 8-22 mins                     Allenmichael Mcpartlin Abner Greenspan, PT Acute Rehabilitation Services Pager: [phone] Office: [phone]    Enedina Finner Kandiss Ihrig 07/19/2018, 9:56 AM

## 2018-07-19 NOTE — Progress Notes (Signed)
Ranelle Oyster, MD  Physician  Physical Medicine and Rehabilitation  Consult Note  Signed  Date of Service:  07/11/2018 6:08 AM       Related encounter: ED to Hosp-Admission (Current) from 07/07/2018 in [facility]      Signed      Expand All Collapse All    Show:Clear all [x] Manual[x] Template[] Copied  Added by: [x] Angiulli, Mcarthur Rossetti, PA-C[x] Ranelle Oyster, MD  [] Hover for details      Physical Medicine and Rehabilitation Consult Reason for Consult: Right side weakness Referring Physician: Dr. Marikay Alar   HPI: Michael Mercado is a 78 y.o.right handed male with history of hypertension, hyperlipidemia, mild mental retardation with anxiety/schizophrenia maintained on Klonopin 0.5 mg daily as needed, Paxil 20 mg daily at bedtime  as well as tobacco abuse. Per report patient lives at [facility]  group home. He was able to manage basic ADLs. Presented 07/07/2018 after a fall getting out of the shower. Per report patient did strike his head but did not lose consciousness. Cranial CT scan showed a large mixed attenuation left whole low hemispheric subdural hematoma measuring 2.8 cm in maximal diameter with associated extensive mass effect and left-to-right midline shift. Underwent left craniotomy for evacuation of subdural hematoma 07/07/2018 per Dr. Marikay Alar. Patient was successfully extubated 07/07/2018. Maintained on Keppra for seizure prophylaxis. Patient is currently NPO with alternative means of nutritional support. Follow-up cranial CT scan showed midline shift improved to 1 cm. Therapy evaluation completed with recommendations of physical medicine rehabilitation consult.   Review of Systems  Unable to perform ROS: Acuity of condition       Past Medical History:  Diagnosis Date  . Hypercholesteremia   . Hypertension   . Moderate intellectual disability   . Mood disorder (HCC)   . MR (mental retardation)   . Obesity         Past Surgical History:  Procedure Laterality Date  . CRANIOTOMY Left 07/07/2018   Procedure: CRANIOTOMY HEMATOMA EVACUATION SUBDURAL;  Surgeon: Tia Alert, MD;  Location: [facility] OR;  Service: Neurosurgery;  Laterality: Left;        Family History  Problem Relation Age of Onset  . Stroke Sister    Social History:  reports that he has been smoking cigarettes. He has been smoking about 0.33 packs per day. He has never used smokeless tobacco. He reports that he does not drink alcohol or use drugs. Allergies: No Known Allergies       Medications Prior to Admission  Medication Sig Dispense Refill  . amLODipine (NORVASC) 10 MG tablet Take 1 tablet (10 mg total) by mouth daily. 90 tablet 3  . aspirin 81 MG EC tablet TAKE 1 TABLET BY MOUTH ONCE DAILY. **DO NOT CRUSH** (Patient taking differently: Take 81 mg by mouth daily. ) 30 tablet 11  . BREO ELLIPTA 100-25 MCG/INH AEPB INHALE 1 PUFF ONCE DAILY. (Patient taking differently: Inhale 1 puff into the lungs daily. ) 60 each 5  . clonazePAM (KLONOPIN) 0.5 MG tablet Take 0.5 mg by mouth daily as needed for anxiety.     . COMPLETE ALLERGY MEDICINE 25 MG capsule TAKE 1 CAPSULE BY MOUTH DAILY AS NEEDED FOR ALLERGY SYMPTOMS. CONTACTNURSE IF SYMPTOMS WORSEN. (Patient taking differently: Take 25 mg by mouth daily as needed for allergies. ) 30 capsule PRN  . Emollient (MINERIN) LOTN APPLY TWICE DAILY 473 mL 3  . hydrocortisone cream 1 % APPLY TO THE AFFECTED AREA(s) THREE TIMES DAILY. (Patient  taking differently: Apply 1 application topically 3 (three) times daily. ) 28.35 g 2  . loperamide (IMODIUM) 2 MG capsule TAKE 2 CAPSULES BY MOUTH AS NEEDED AFTER 2ND LOOSE STOOL REPEAT AFTERNEXT LOOSE STOOL MAX 3 DOSES, NOTIFY NURSE. (Patient taking differently: Take 4 mg by mouth as needed for diarrhea or loose stools. ) 30 capsule 5  . MAPAP 500 MG tablet TAKE 2 TABS BY MOUTH EVERY 4 HRS AS NEEDED FOR HEADACHE/MILD TO MODERATE PAIN OR TEMP OF 100.3 & ABOVE IF  TEMP UNRESOLVED AFTER 24 HRS MUST (Patient taking differently: Take 1,000 mg by mouth every 4 (four) hours as needed for moderate pain or headache. ) 30 tablet 1  . Neomycin-Bacitracin-Polymyxin (TRIPLE ANTIBIOTIC) [phone] OINT MINOR CUT/ABRASION:CLEAN W/ MILD SOAP/WATER THEN APPLY THIN LAYER TO AFFECTED AREA MAY COVER W/ BANDAID REMOVE AFTER 24 HRS NOTIFY NURSE OF (Patient taking differently: Apply 1 application topically as needed (cuts or abrasion). ) 28.4 g 2  . PARoxetine (PAXIL) 20 MG tablet Take 20 mg by mouth at bedtime.     . Polyethylene Glycol (LIP BALM BASE EX) Apply topically.    . QC ANTACID/ANTI-GAS [phone] MG/5ML suspension TAKE AS NEEDED FOR UPSET STOMACH. NO MORE THAN 3 DOSES IN 24 HOURS. NOTIFY NURSE IF SYMPTOMS PERSIST. (Patient taking differently: Take 10 mLs by mouth as needed for indigestion. ) 355 mL 5  . QC MILK OF MAGNESIA 400 MG/5ML suspension TAKE (2 TBLS)AS NEEDED AFTER 3 DAYS NO BM MAY REPEAT X3 DOSES IFNO RESOLUTION CALL NURSE (CONSTIPATION) (Patient taking differently: Take 30 mLs by mouth as needed for mild constipation. ) 355 mL 3  . risperiDONE (RISPERDAL) 1 MG tablet Take 1 mg by mouth 2 (two) times daily.    . Sodium Bicarbonate (AFTER BITE EX) Apply topically as needed.    . Sunscreens (COPPERTONE SPORT SPF30 EX) Apply topically.    . TUSSIN 100 MG/5ML syrup TAKE (2TSP) BY MOUTH EVERY 6 HOURS AS NEEDED FOR COUGH/COLD MAX4 DOSES IN 24 HRS, NOTIFY NURSE IF PERSISTS. (Patient taking differently: Take 200 mg by mouth every 6 (six) hours as needed for cough. ) 118 mL 2  . VENTOLIN HFA 108 (90 Base) MCG/ACT inhaler INHALE (2) PUFFS BY MOUTH 4 TIMES DAILY AS NEEDED FOR SHORTNESS OF BREATH. (Patient taking differently: Inhale 2 puffs into the lungs every 4 (four) hours as needed for wheezing or shortness of breath. ) 18 g 5  . Emollient (LUBRICATING LOTION EX) Apply topically 2 (two) times daily.    [name] ibuprofen (ADVIL,MOTRIN) 400 MG  tablet Take 400 mg by mouth every 8 (eight) hours as needed.    . loperamide (IMODIUM) 2 MG capsule Take by mouth as needed for diarrhea or loose stools.    [name] loperamide (IMODIUM) 2 MG capsule TAKE 2 CAPSULES BY MOUTH AS NEEDED AFTER 2ND LOOSE STOOL. TAKE 1 CAPSULE AFTER NEXT LOOSE STOOL MAX 3 DOSES, NOTIFY NURSE. (Patient not taking: Reported on 07/07/2018) 30 capsule PRN    Home: Home Living Family/patient expects to be discharged to:: Group home Additional Comments: Not a lot of infomation about group home available at time of writing this note  Functional History: Prior Function Comments: Not a lot of information available re: PLOF at time of writing this note, except that normally [place] is social and talkative; It is likely he was walking independently, and managing his basic ADLs in the group home setting Functional Status:  Mobility: Bed Mobility Overal bed mobility: Needs Assistance Bed Mobility:  Supine to Sit Supine to sit: Total assist, +2 for physical assistance General bed mobility comments: max to Total assist to come sit to EOB Transfers Overall transfer level: Needs assistance Equipment used: 2 person hand held assist(and bed pad cradling hips) Transfers: Sit to/from Stand, Stand Pivot Transfers Sit to Stand: +2 physical assistance, Mod assist Stand pivot transfers: +2 physical assistance, Mod assist General transfer comment: heavy mod assist to power up to stand, knees blocked for safety; noted LE weakness, but no overt knee buckling, and appropriate recruitment of LE musculature to stabilize with incr time in standing and with weight shifts/steps for transfer to recliner  ADL: ADL Overall ADL's : Needs assistance/impaired Eating/Feeding: NPO Grooming: Wash/dry hands, Wash/dry face, Total assistance, Sitting Upper Body Bathing: Total assistance, Sitting Lower Body Bathing: Total assistance, Sit to/from stand Upper Body Dressing : Total assistance, Sitting  Lower Body Dressing: Total assistance, Sit to/from stand Toilet Transfer: Maximal assistance, +2 for physical assistance, +2 for safety/equipment, Stand-pivot, BSC Toileting- Clothing Manipulation and Hygiene: Total assistance, Sit to/from stand Functional mobility during ADLs: Maximal assistance, Moderate assistance, +2 for physical assistance, +2 for safety/equipment General ADL Comments: Pt unable to assist with ADLs, but he did spontaneously advance LEs during transfers   Cognition: Cognition Overall Cognitive Status: Difficult to assess Orientation Level: Other (comment)(mute) Cognition Arousal/Alertness: Lethargic(Eyes opened approx 40% of session) Behavior During Therapy: WFL for tasks assessed/performed Overall Cognitive Status: Difficult to assess Area of Impairment: Attention, Following commands, Safety/judgement, Awareness, Problem solving Current Attention Level: Focused Following Commands: Follows one step commands inconsistently, Follows one step commands with increased time Safety/Judgement: Decreased awareness of deficits, Decreased awareness of safety Problem Solving: Decreased initiation, Slow processing General Comments: Nellie did not answer questions this session; he did respond actively to help raise LEs one at a time with sock donning, and offered his hand to replace L glove restraint Difficult to assess due to: Impaired communication, Level of arousal  Blood pressure (!) 147/65, pulse (!) 51, temperature 98.7 F (37.1 C), temperature source Oral, resp. rate 16, height 5' 6.25" (1.683 m), weight 73.4 kg, SpO2 97 %. Physical Exam  Constitutional:  Lethargic, appears comfortable  HENT:  Nasogastric tube in place, left crani incision dressed  Eyes: Pupils are equal, round, and reactive to light.  Neck: Normal range of motion.  Cardiovascular: Normal rate. Exam reveals no friction rub.  Respiratory: Effort normal. No respiratory distress. He exhibits tenderness.   GI: Soft. He exhibits no distension.  Musculoskeletal:     Comments: Pt lying in bed. Lethargic. Arouses to verbal stim. Inconsistently following 1 step commands. Withdraws to pain L>R. Moves left side preferentially to right.   Neurological:  Patient does arouse to verbal stimuli but was nonverbal. Opens eyes but does not make consistent eye contact. He did not follow commands. Exam overall is limited.  Psychiatric: He has a normal mood and affect. His behavior is normal.    LabResultsLast24Hours       Results for orders placed or performed during the hospital encounter of 07/07/18 (from the past 24 hour(s))  Glucose, capillary     Status: Abnormal   Collection Time: 07/10/18  8:20 AM  Result Value Ref Range   Glucose-Capillary 134 (H) 70 - 99 mg/dL   Comment 1 Notify RN    Comment 2 Document in Chart   Glucose, capillary     Status: Abnormal   Collection Time: 07/10/18 11:25 AM  Result Value Ref Range   Glucose-Capillary 108 (H) 70 -  99 mg/dL   Comment 1 Notify RN    Comment 2 Document in Chart   Glucose, capillary     Status: Abnormal   Collection Time: 07/10/18  4:05 PM  Result Value Ref Range   Glucose-Capillary 136 (H) 70 - 99 mg/dL   Comment 1 Notify RN    Comment 2 Document in Chart   Glucose, capillary     Status: Abnormal   Collection Time: 07/10/18  7:51 PM  Result Value Ref Range   Glucose-Capillary 124 (H) 70 - 99 mg/dL  Glucose, capillary     Status: Abnormal   Collection Time: 07/10/18 11:07 PM  Result Value Ref Range   Glucose-Capillary 117 (H) 70 - 99 mg/dL  Glucose, capillary     Status: Abnormal   Collection Time: 07/11/18  3:31 AM  Result Value Ref Range   Glucose-Capillary 129 (H) 70 - 99 mg/dL     [id number](Last48hours)  No results found.     Assessment/Plan: Diagnosis: 78 yo male with left hemispheric subdural hemorrhage after fall. 1. Does the need for close, 24 hr/day medical supervision in concert  with the patient's rehab needs make it unreasonable for this patient to be served in a less intensive setting? Yes 2. Co-Morbidities requiring supervision/potential complications: HTN, MR with anxiety and schizophrenia 3. Due to bladder management, bowel management, safety, skin/wound care, disease management, medication administration, pain management and patient education, does the patient require 24 hr/day rehab nursing? Yes and Potentially 4. Does the patient require coordinated care of a physician, rehab nurse, PT (1-2 hrs/day, 5 days/week), OT (1-2 hrs/day, 5 days/week) and SLP (1-2 hrs/day, 5 days/week) to address physical and functional deficits in the context of the above medical diagnosis(es)? Yes Addressing deficits in the following areas: balance, endurance, locomotion, strength, transferring, bowel/bladder control, bathing, dressing, feeding, grooming, toileting, cognition, speech, language, swallowing and psychosocial support 5. Can the patient actively participate in an intensive therapy program of at least 3 hrs of therapy per day at least 5 days per week? Yes and Potentially 6. The potential for patient to make measurable gains while on inpatient rehab is good 7. Anticipated functional outcomes upon discharge from inpatient rehab are supervision and min assist  with PT, supervision and min assist with OT, supervision and min assist with SLP. (?decrease the burden of care) 8. Estimated rehab length of stay to reach the above functional goals is: 20-27 days 9. Anticipated D/C setting: Home 10. Anticipated post D/C treatments: HH therapy 11. Overall Rehab/Functional Prognosis: excellent  RECOMMENDATIONS: This patient's condition is appropriate for continued rehabilitative care in the following setting: CIR Patient has agreed to participate in recommended program. N/A Note that insurance prior authorization may be required for reimbursement for recommended care.  Comment: Pt lives at  group home. Will not return to independent level at the end of an inpatient rehab admit. Rehab Admissions Coordinator to follow up.  Thanks,  Ranelle OysterZachary T. Swartz, MD, [place] DomFAAPMR  I have personally performed a face to face diagnostic evaluation of this patient. Additionally, I have examined pertinent labs and radiographic images. I have reviewed and concur with the physician assistant's documentation above.    Mcarthur RossettiDaniel J Angiulli, PA-C 07/11/2018        Revision History                        Routing History

## 2018-07-19 NOTE — Progress Notes (Signed)
Patient was admitted to rehab 4W-21 by wheelchair from 4NP-11. Nurse and nursetech brought his personal belonging along with him. Patient is resting comfortably in the bed with bed in low position and call bell within reach. Patient expressed he is not in any pain. Reuben Likes, LPN

## 2018-07-19 NOTE — Progress Notes (Signed)
IP rehab admissions - I called and spoke with patient's niece and with the "house parent" at patient's group home yesterday.  They are in agreement to inpatient rehab admission. I called Dr. Yetta Barre today and patient has been medically cleared for admission to CIR for today.  Bed available and will admit to inpatient rehab today.  Call me for questions.  [phone]

## 2018-07-19 NOTE — H&P (Signed)
Physical Medicine and Rehabilitation Admission H&P        Chief Complaint  Patient presents with   Code Stroke    Chief complaint: headache   HPI: Michael Mercado is a 78 year old right-handed male with history of hypertension on Norvasc 10 mg daily, hyperlipidemia, mild mental retardation with anxiety/schizophrenia maintained on Klonopin 0.5 mg daily as needed, Risperdal 1 mg twice a day as well as Paxil 20 mg daily at bedtime and also history of tobacco abuse. Per report patient lives at [facility] where he has resided at friends number of years. He was able to manage basic ADLs prior to admission and was also doing some simple volunteer work. Presented 07/07/2018 after a fall getting out of the shower. Per report patient did strike his head but did not lose consciousness. Cranial CT scan showed a large mixed attenuation left whole low hemispheric subdural hematoma measuring 2.8 cm in maximal diameter with associated extensive mass effect and left-to-right midline shift. Underwent left craniotomy for evacuation of subdural hematoma 07/07/2018 per Dr. Marikay Alar. He was extubated 07/07/2018 and maintained on Keppra for seizure prophylaxis. On 07/13/2018 patient more difficult to arouse was not moving his right arm as well as some increased facial weakness. Follow-up neurosurgery felt need for reexploration and underwent redo left craniotomy for evacuation of acute subdural hematoma 07/13/2018. Patient did require nasogastric tube for nutritional support. His diet has been advanced to dysphagia #1 thin liquid. Follow-up cranial CT scan 07/14/2018 showed decreased subdural blood on the left with subdural drain. Since been removed. Decreased mass effect and midline shift now 6 mm as compared to 13 mm on prior tracings. Patient's level of alertness has continued to improve. Therapy evaluations ongoing with recommendations of physical medicine rehabilitation consult. Patient was admitted  for a compress of rehabilitation program.   Review of Systems  Unable to perform ROS: Mental acuity        Past Medical History:  Diagnosis Date   Hypercholesteremia     Hypertension     Moderate intellectual disability     Mood disorder (HCC)     MR (mental retardation)     Obesity           Past Surgical History:  Procedure Laterality Date   CRANIOTOMY Left 07/07/2018    Procedure: CRANIOTOMY HEMATOMA EVACUATION SUBDURAL;  Surgeon: Tia Alert, MD;  Location: [facility] OR;  Service: Neurosurgery;  Laterality: Left;   CRANIOTOMY Left 07/13/2018    Procedure: Redo Left CRANIOTOMY FOR SUBDURAL HEMATOMA;  Surgeon: Tia Alert, MD;  Location: [facility] OR;  Service: Neurosurgery;  Laterality: Left;         Family History  Problem Relation Age of Onset   Stroke Sister      Social History:  reports that he has been smoking cigarettes. He has been smoking about 0.33 packs per day. He has never used smokeless tobacco. He reports that he does not drink alcohol or use drugs. Allergies: No Known Allergies Medications Prior to Admission  Medication Sig Dispense Refill   amLODipine (NORVASC) 10 MG tablet Take 1 tablet (10 mg total) by mouth daily. 90 tablet 3   aspirin 81 MG EC tablet TAKE 1 TABLET BY MOUTH ONCE DAILY. **DO NOT CRUSH** (Patient taking differently: Take 81 mg by mouth daily. ) 30 tablet 11   BREO ELLIPTA 100-25 MCG/INH AEPB INHALE 1 PUFF ONCE DAILY. (Patient taking differently: Inhale 1 puff into the lungs daily. ) 60  each 5   clonazePAM (KLONOPIN) 0.5 MG tablet Take 0.5 mg by mouth daily as needed for anxiety.        COMPLETE ALLERGY MEDICINE 25 MG capsule TAKE 1 CAPSULE BY MOUTH DAILY AS NEEDED FOR ALLERGY SYMPTOMS. CONTACTNURSE IF SYMPTOMS WORSEN. (Patient taking differently: Take 25 mg by mouth daily as needed for allergies. ) 30 capsule PRN   Emollient (MINERIN) LOTN APPLY TWICE DAILY 473 mL 3   hydrocortisone cream 1 % APPLY TO THE AFFECTED AREA(s) THREE TIMES  DAILY. (Patient taking differently: Apply 1 application topically 3 (three) times daily. ) 28.35 g 2   loperamide (IMODIUM) 2 MG capsule TAKE 2 CAPSULES BY MOUTH AS NEEDED AFTER 2ND LOOSE STOOL REPEAT AFTERNEXT LOOSE STOOL MAX 3 DOSES, NOTIFY NURSE. (Patient taking differently: Take 4 mg by mouth as needed for diarrhea or loose stools. ) 30 capsule 5   MAPAP 500 MG tablet TAKE 2 TABS BY MOUTH EVERY 4 HRS AS NEEDED FOR HEADACHE/MILD TO MODERATE PAIN OR TEMP OF 100.3 & ABOVE IF TEMP UNRESOLVED AFTER 24 HRS MUST (Patient taking differently: Take 1,000 mg by mouth every 4 (four) hours as needed for moderate pain or headache. ) 30 tablet 1   Neomycin-Bacitracin-Polymyxin (TRIPLE ANTIBIOTIC) [phone] OINT MINOR CUT/ABRASION:CLEAN W/ MILD SOAP/WATER THEN APPLY THIN LAYER TO AFFECTED AREA MAY COVER W/ BANDAID REMOVE AFTER 24 HRS NOTIFY NURSE OF (Patient taking differently: Apply 1 application topically as needed (cuts or abrasion). ) 28.4 g 2   PARoxetine (PAXIL) 20 MG tablet Take 20 mg by mouth at bedtime.        Polyethylene Glycol (LIP BALM BASE EX) Apply topically.       QC ANTACID/ANTI-GAS 200-200-20 MG/5ML suspension TAKE AS NEEDED FOR UPSET STOMACH. NO MORE THAN 3 DOSES IN 24 HOURS. NOTIFY NURSE IF SYMPTOMS PERSIST. (Patient taking differently: Take 10 mLs by mouth as needed for indigestion. ) 355 mL 5   QC MILK OF MAGNESIA 400 MG/5ML suspension TAKE (2 TBLS)AS NEEDED AFTER 3 DAYS NO BM MAY REPEAT X3 DOSES IFNO RESOLUTION CALL NURSE (CONSTIPATION) (Patient taking differently: Take 30 mLs by mouth as needed for mild constipation. ) 355 mL 3   risperiDONE (RISPERDAL) 1 MG tablet Take 1 mg by mouth 2 (two) times daily.       Sodium Bicarbonate (AFTER BITE EX) Apply topically as needed.       Sunscreens (COPPERTONE SPORT SPF30 EX) Apply topically.       TUSSIN 100 MG/5ML syrup TAKE (2TSP) BY MOUTH EVERY 6 HOURS AS NEEDED FOR COUGH/COLD MAX4 DOSES IN 24 HRS, NOTIFY NURSE IF  PERSISTS. (Patient taking differently: Take 200 mg by mouth every 6 (six) hours as needed for cough. ) 118 mL 2   VENTOLIN HFA 108 (90 Base) MCG/ACT inhaler INHALE (2) PUFFS BY MOUTH 4 TIMES DAILY AS NEEDED FOR SHORTNESS OF BREATH. (Patient taking differently: Inhale 2 puffs into the lungs every 4 (four) hours as needed for wheezing or shortness of breath. ) 18 g 5   Emollient (LUBRICATING LOTION EX) Apply topically 2 (two) times daily.       ibuprofen (ADVIL,MOTRIN) 400 MG tablet Take 400 mg by mouth every 8 (eight) hours as needed.       loperamide (IMODIUM) 2 MG capsule Take by mouth as needed for diarrhea or loose stools.       loperamide (IMODIUM) 2 MG capsule TAKE 2 CAPSULES BY MOUTH AS NEEDED AFTER 2ND LOOSE STOOL. TAKE 1 CAPSULE AFTER  NEXT LOOSE STOOL MAX 3 DOSES, NOTIFY NURSE. (Patient not taking: Reported on 07/07/2018) 30 capsule PRN      Drug Regimen Review Drug regimen was reviewed and remains appropriate with no significant issues identified   Home: Home Living Family/patient expects to be discharged to:: Group home Additional Comments: Not a lot of infomation about group home available at time of writing this note   Functional History: Prior Function Comments: Not a lot of information available re: PLOF at time of writing this note, except that normally Jafar is social and talkative; It is likely he was walking independently, and managing his basic ADLs in the group home setting   Functional Status:  Mobility: Bed Mobility Overal bed mobility: Needs Assistance Bed Mobility: Supine to Sit Rolling: Mod assist Sidelying to sit: Mod assist Supine to sit: Supervision General bed mobility comments: pt with supervision for lines and safety, with cues to initiate pt able to roll to side and rise without sequential cues or assist Transfers Overall transfer level: Needs assistance Equipment used: 2 person hand held assist Transfers: Sit to/from Stand Sit to Stand: Min  guard Stand pivot transfers: Mod assist General transfer comment: cues for hand placement and safety with only close guarding and cues needed to stand from bed and toilet this session.  Ambulation/Gait Ambulation/Gait assistance: Mod assist, +2 safety/equipment Gait Distance (Feet): 300 Feet Assistive device: Rolling walker (2 wheeled) Gait Pattern/deviations: Step-through pattern, Decreased stride length, Trunk flexed, Narrow base of support, Decreased stance time - left General Gait Details: Pt with improved balance, gait and function during activity today. Pt with decreased right lean and able to maintain self closer to in midline in walker. He continues to be easily distracted and with distraction increased LOB with mod assist to recover. Decreased attention to right with max cues to attend to right side and safety of environment. I Gait velocity interpretation: 1.31 - 2.62 ft/sec, indicative of limited community ambulator   ADL: ADL Overall ADL's : Needs assistance/impaired Eating/Feeding: NPO Grooming: Wash/dry face, Minimal assistance, Bed level Grooming Details (indicate cue type and reason): using LUE--VCs to wash left side of face Upper Body Bathing: Total assistance, Sitting Lower Body Bathing: Total assistance, Sit to/from stand Upper Body Dressing : Total assistance, Sitting Lower Body Dressing: Total assistance, Sit to/from stand Toilet Transfer: Total assistance, +2 for physical assistance, Stand-pivot Toilet Transfer Details (indicate cue type and reason): use of bed pad and gait belt; bed to recliner going to pt's left Toileting- Clothing Manipulation and Hygiene: Total assistance, Sit to/from stand Functional mobility during ADLs: Maximal assistance, Moderate assistance, +2 for physical assistance, +2 for safety/equipment General ADL Comments: Pt total A for all basic ADLs. Attempted hand over hand with pt washing his face with LUE while seated EOB, but pt guarding my A to  help him. (earlier while supine in bed pt reached up with LUE to wipe at nose--so the movement is there)   Cognition: Cognition Overall Cognitive Status: No family/caregiver present to determine baseline cognitive functioning Orientation Level: Oriented to person, Oriented to place Cognition Arousal/Alertness: Awake/alert Behavior During Therapy: Flat affect Overall Cognitive Status: No family/caregiver present to determine baseline cognitive functioning Area of Impairment: Attention, Following commands, Safety/judgement, Awareness, Problem solving, Orientation Orientation Level: Time, Situation Current Attention Level: Sustained Following Commands: Follows one step commands with increased time, Follows one step commands consistently Safety/Judgement: Decreased awareness of deficits, Decreased awareness of safety Awareness: Intellectual Problem Solving: Slow processing, Decreased initiation, Requires verbal cues, Requires tactile  cues General Comments: pt able to state "hospital" and "bathroom" today to respond to place and needs. Pt pleasant with maintained decreased attention to right Difficult to assess due to: Level of arousal   Physical Exam: Blood pressure (!) 175/71, pulse 67, temperature (!) 97.3 F (36.3 C), resp. rate 20, height 5' 6.25" (1.683 m), weight 73.4 kg, SpO2 94 %. Physical Exam  Constitutional:  lethargic  HENT:  Right Ear: External ear normal.  Left Ear: External ear normal.  Craniotomy site clean and dry  Eyes: Pupils are equal, round, and reactive to light. Conjunctivae are normal.  Neck: Normal range of motion. No thyromegaly present.  Cardiovascular: Normal rate and regular rhythm. Exam reveals no friction rub.  No murmur heard. Respiratory: Effort normal. No respiratory distress. He has no wheezes.  GI: Soft. He exhibits no distension. There is no abdominal tenderness.  Musculoskeletal:        General: No edema.  Neurological:  Lethargic but does open  eyes. Lateral deviation of left eye. Able to look up and down. Follows simple commands with verbal and tactile cues. Does utter words which are dysarthric but unrelated to my questions. Moves all 4s, left more than right and withdraws to pain on all 4's.   Skin: Skin is warm.  Psychiatric:  Confused, lethargic.       Lab Results Last 48 Hours       Results for orders placed or performed during the hospital encounter of 07/07/18 (from the past 48 hour(s))  Glucose, capillary     Status: Abnormal    Collection Time: 07/16/18  4:10 PM  Result Value Ref Range    Glucose-Capillary 112 (H) 70 - 99 mg/dL    Comment 1 Notify RN      Comment 2 Document in Chart    Glucose, capillary     Status: Abnormal    Collection Time: 07/16/18  8:22 PM  Result Value Ref Range    Glucose-Capillary 201 (H) 70 - 99 mg/dL  Glucose, capillary     Status: Abnormal    Collection Time: 07/17/18 12:29 AM  Result Value Ref Range    Glucose-Capillary 129 (H) 70 - 99 mg/dL  Glucose, capillary     Status: Abnormal    Collection Time: 07/17/18  3:56 AM  Result Value Ref Range    Glucose-Capillary 157 (H) 70 - 99 mg/dL  Glucose, capillary     Status: Abnormal    Collection Time: 07/17/18  8:16 AM  Result Value Ref Range    Glucose-Capillary 127 (H) 70 - 99 mg/dL    Comment 1 Notify RN      Comment 2 Document in Chart    Glucose, capillary     Status: Abnormal    Collection Time: 07/17/18 12:12 PM  Result Value Ref Range    Glucose-Capillary 134 (H) 70 - 99 mg/dL    Comment 1 Notify RN      Comment 2 Document in Chart    Glucose, capillary     Status: Abnormal    Collection Time: 07/17/18  4:16 PM  Result Value Ref Range    Glucose-Capillary 131 (H) 70 - 99 mg/dL    Comment 1 Notify RN      Comment 2 Document in Chart        Imaging Results (Last 48 hours)  No results found.           Medical Problem List and Plan: 1.  Decreased functional mobility with right  side weakness secondary to traumatic  left subdural hematoma. Status post craniotomy evacuation 07/07/2018 followed by reaccumulation of SDH with redo craniotomy 07/13/2018             -admit to inpatient rehab 2.  Antithrombotics: -DVT/anticoagulation:  SCDs             -antiplatelet therapy: N/A 3. Pain Management:  Tylenol as needed 4. Mood/mild mental retardation with schizophrenia: Patient on Klonopin 0.5 mg daily prior to admission and Paxil 20 mg daily, Risperdal1 mg twice a day. Resume as needed             -antipsychotic agents: N/A 5. Neuropsych: This patient he has not capable of making decisions on his own behalf. 6. Skin/Wound Care:  Routine skin checks 7. Fluids/Electrolytes/Nutrition:  Routine ins and outs with follow-up chemistries 8. Seizure prophylaxis. Keppra 500 mg twice a day 9. Dysphagia. Dysphagia #1 thin liquids. Follow-up speech therapy 10.Tobacco abuse. Counseling     Post Admission Physician Evaluation: 1. Functional deficits secondary  to TBI. 2. Patient is admitted to receive collaborative, interdisciplinary care between the physiatrist, rehab nursing staff, and therapy team. 3. Patient's level of medical complexity and substantial therapy needs in context of that medical necessity cannot be provided at a lesser intensity of care such as a SNF. 4. Patient has experienced substantial functional loss from his/her baseline which was documented above under the "Functional History" and "Functional Status" headings.  Judging by the patient's diagnosis, physical exam, and functional history, the patient has potential for functional progress which will result in measurable gains while on inpatient rehab.  These gains will be of substantial and practical use upon discharge  in facilitating mobility and self-care at the household level. 5. Physiatrist will provide 24 hour management of medical needs as well as oversight of the therapy plan/treatment and provide guidance as appropriate regarding the interaction of  the two. 6. The Preadmission Screening has been reviewed and patient status is unchanged unless otherwise stated above. 7. 24 hour rehab nursing will assist with bladder management, bowel management, safety, skin/wound care, disease management, medication administration, pain management and patient education  and help integrate therapy concepts, techniques,education, etc. 8. PT will assess and treat for/with: Lower extremity strength, range of motion, stamina, balance, functional mobility, safety, adaptive techniques and equipment, NMR, cognitive perceptual awareness.   Goals are: supervision to min assist. 9. OT will assess and treat for/with: ADL's, functional mobility, safety, upper extremity strength, adaptive techniques and equipment, NMR, cognition family ed.   Goals are: supervision to min assist. Therapy may proceed with showering this patient. 10. SLP will assess and treat for/with: cognition, swallowing, language, and communication.  Goals are: supervision to min assist. 11. Case Management and Social Worker will assess and treat for psychological issues and discharge planning. 12. Team conference will be held weekly to assess progress toward goals and to determine barriers to discharge. 13. Patient will receive at least 3 hours of therapy per day at least 5 days per week. 14. ELOS: 20-27 days       15. Prognosis:  excellent   I have personally performed a face to face diagnostic evaluation of this patient and formulated the key components of the plan.  Additionally, I have personally reviewed laboratory data, imaging studies, as well as relevant notes and concur with the physician assistant's documentation above.  Ranelle Oyster, MD, FAAPMR    Mcarthur Rossetti Angiulli, PA-C 07/18/2018

## 2018-07-20 ENCOUNTER — Inpatient Hospital Stay (HOSPITAL_COMMUNITY): Payer: Medicare Other | Admitting: Physical Therapy

## 2018-07-20 ENCOUNTER — Encounter (HOSPITAL_COMMUNITY): Payer: Self-pay

## 2018-07-20 ENCOUNTER — Inpatient Hospital Stay (HOSPITAL_COMMUNITY): Payer: Medicare Other | Admitting: Speech Pathology

## 2018-07-20 ENCOUNTER — Inpatient Hospital Stay (HOSPITAL_COMMUNITY): Payer: Medicare Other | Admitting: Occupational Therapy

## 2018-07-20 LAB — CBC WITH DIFFERENTIAL/PLATELET
Abs Immature Granulocytes: 0.07 10*3/uL (ref 0.00–0.07)
Basophils Absolute: 0 10*3/uL (ref 0.0–0.1)
Basophils Relative: 0 %
EOS PCT: 1 %
Eosinophils Absolute: 0.2 10*3/uL (ref 0.0–0.5)
HCT: 31.7 % — ABNORMAL LOW (ref 39.0–52.0)
Hemoglobin: 10 g/dL — ABNORMAL LOW (ref 13.0–17.0)
Immature Granulocytes: 1 %
Lymphocytes Relative: 18 %
Lymphs Abs: 2 10*3/uL (ref 0.7–4.0)
MCH: 28.2 pg (ref 26.0–34.0)
MCHC: 31.5 g/dL (ref 30.0–36.0)
MCV: 89.5 fL (ref 80.0–100.0)
MONO ABS: 0.7 10*3/uL (ref 0.1–1.0)
Monocytes Relative: 6 %
Neutro Abs: 8.1 10*3/uL — ABNORMAL HIGH (ref 1.7–7.7)
Neutrophils Relative %: 74 %
Platelets: 290 10*3/uL (ref 150–400)
RBC: 3.54 MIL/uL — ABNORMAL LOW (ref 4.22–5.81)
RDW: 14.5 % (ref 11.5–15.5)
WBC: 11 10*3/uL — ABNORMAL HIGH (ref 4.0–10.5)
nRBC: 0 % (ref 0.0–0.2)

## 2018-07-20 LAB — COMPREHENSIVE METABOLIC PANEL
ALK PHOS: 64 U/L (ref 38–126)
ALT: 54 U/L — ABNORMAL HIGH (ref 0–44)
AST: 30 U/L (ref 15–41)
Albumin: 2.4 g/dL — ABNORMAL LOW (ref 3.5–5.0)
Anion gap: 5 (ref 5–15)
BUN: 27 mg/dL — ABNORMAL HIGH (ref 8–23)
CALCIUM: 8.5 mg/dL — AB (ref 8.9–10.3)
CO2: 27 mmol/L (ref 22–32)
Chloride: 107 mmol/L (ref 98–111)
Creatinine, Ser: 1.11 mg/dL (ref 0.61–1.24)
GFR calc Af Amer: >60 mL/min (ref >60)
Glucose, Bld: 89 mg/dL (ref 70–99)
Potassium: 4.4 mmol/L (ref 3.5–5.1)
Sodium: 139 mmol/L (ref 135–145)
TOTAL PROTEIN: 4.7 g/dL — AB (ref 6.5–8.1)
Total Bilirubin: 0.7 mg/dL (ref 0.3–1.2)

## 2018-07-20 MED ORDER — METHYLPHENIDATE HCL 5 MG PO TABS
5.0000 mg | ORAL_TABLET | Freq: Two times a day (BID) | ORAL | Status: DC
  Administered 2018-07-20 – 2018-07-28 (×16): 5 mg via ORAL
  Filled 2018-07-20 (×16): qty 1

## 2018-07-20 NOTE — Evaluation (Signed)
Occupational Therapy Assessment and Plan  Patient Details  Name: Michael Mercado MRN: [id number] Date of Birth: 1940-07-08  OT Diagnosis: abnormal posture, hemiplegia affecting dominant side, muscle weakness (generalized) and coordination disorder Rehab Potential: Rehab Potential (ACUTE ONLY): Fair ELOS: 7-10 days   Today's Date: 07/20/2018 OT Individual Time: 7035-0093 and 1100-1110 OT Individual Time Calculation (min): 58 min   And 10 minutes 20 missed minutes secondary to lethargy  Problem List:  Patient Active Problem List   Diagnosis Date Noted  . Traumatic subdural hematoma ([place]) 07/19/2018  . Subdural hematoma ([place]) 07/07/2018  . Endotracheally intubated 07/07/2018  . Encounter for postanesthesia care 07/07/2018  . CKD (chronic kidney disease), stage II 03/18/2016  . HTN (hypertension), benign 03/18/2015  . COPD (chronic obstructive pulmonary disease) ([place]) 03/18/2015  . Intellectual disability 03/18/2015  . Schizophrenia ([place]) 03/18/2015  . Hyperlipidemia LDL goal <130 03/18/2015    Past Medical History:  Past Medical History:  Diagnosis Date  . Hypercholesteremia   . Hypertension   . Moderate intellectual disability   . Mood disorder ([place])   . MR (mental retardation)   . Obesity    Past Surgical History:  Past Surgical History:  Procedure Laterality Date  . CRANIOTOMY Left 07/07/2018   Procedure: CRANIOTOMY HEMATOMA EVACUATION SUBDURAL;  Surgeon: Eustace Moore, MD;  Location: [facility];  Service: Neurosurgery;  Laterality: Left;  . CRANIOTOMY Left 07/13/2018   Procedure: Redo Left CRANIOTOMY FOR SUBDURAL HEMATOMA;  Surgeon: Eustace Moore, MD;  Location: [facility];  Service: Neurosurgery;  Laterality: Left;    Assessment & Plan Clinical Impression: Patient is a 78 y.o. year old male with history of hypertension on Norvasc 10 mg daily, hyperlipidemia, mild mental retardation with anxiety/schizophrenia maintained on Klonopin 0.5 mg daily as needed, Risperdal 1 mg  twice a day as well as Paxil 20 mg daily at bedtime and also history of tobacco abuse. Per report [facility] years. He was able to manage basic ADLs prior to admissionand was also doing some simple volunteer work. Presented 07/07/2018 after a fall getting out of the shower. Per report patient did strike his head but did not lose consciousness. Cranial CT scan showed a large mixed attenuation left whole low hemispheric subdural hematoma measuring 2.8 cm in maximal diameter with associated extensive mass effect and left-to-right midline shift. Underwent left craniotomy for evacuation of subdural hematoma 07/07/2018 per Dr. Sherley Bounds. He was extubated 07/07/2018 and maintained on Keppra for seizure prophylaxis. On 07/13/2018 patient more difficult to arouse was not moving his right arm as well as some increased facial weakness. Follow-up neurosurgery felt need for reexploration and underwent redo left craniotomy for evacuation of acute subdural hematoma 07/13/2018. Patient did require nasogastric tube for nutritional support. His diet has been advanced to dysphagia #1 thin liquid. Follow-up cranial CT scan 07/14/2018 showed decreased subdural blood on the left with subdural drain. Since been removed. Decreased mass effect and midline shift now 6 mm as compared to 13 mm on prior tracings. Patient's level of alertness has continued to improve. Therapy evaluations ongoing with recommendations of physical medicine rehabilitation consult.  .  Patient transferred to CIR on 07/19/2018 .    Patient currently requires min- mod with basic self-care skills secondary to muscle weakness, decreased cardiorespiratoy endurance, decreased coordination and decreased motor planning, safety awareness, decreased initiation, decreased attention, decreased awareness, decreased problem solving and delayed processing and decreased standing balance, decreased postural control,  hemiplegia, decreased balance strategies and R inattention.  Prior to hospitalization, patient could complete ADLs with independent .  Patient will benefit from skilled intervention to decrease level of assist with basic self-care skills prior to discharge home with care partner.  Anticipate patient will require 24 hour supervision and follow up home health.  OT - End of Session Activity Tolerance: Decreased this session Endurance Deficit: Yes Endurance Deficit Description: multiple rest breaks secondary to fatigue OT Assessment Rehab Potential (ACUTE ONLY): Fair OT Barriers to Discharge: Other (comments) OT Barriers to Discharge Comments: none known at this time OT Patient demonstrates impairments in the following area(s): Balance;Endurance;Cognition;Motor;Pain;Safety OT Basic ADL's Functional Problem(s): Grooming;Bathing;Dressing;Toileting OT Transfers Functional Problem(s): Toilet;Tub/Shower OT Additional Impairment(s): Fuctional Use of Upper Extremity OT Plan OT Intensity: Minimum of 1-2 x/day, 45 to 90 minutes OT Frequency: 5 out of 7 days OT Duration/Estimated Length of Stay: 7-10 days OT Treatment/Interventions: Balance/vestibular training;Neuromuscular re-education;Self Care/advanced ADL retraining;Therapeutic Exercise;Wheelchair propulsion/positioning;Cognitive remediation/compensation;DME/adaptive equipment instruction;UE/LE Strength taining/ROM;Community reintegration;Patient/family education;UE/LE Coordination activities;Discharge planning;Functional mobility training;Psychosocial support;Therapeutic Activities OT Self Feeding Anticipated Outcome(s): S OT Basic Self-Care Anticipated Outcome(s): S OT Toileting Anticipated Outcome(s): S OT Bathroom Transfers Anticipated Outcome(s): S OT Recommendation Recommendations for Other Services: Other (comment)(none at this time) Patient destination: Home Follow Up Recommendations: Home health OT;24 hour supervision/assistance Equipment  Recommended: To be determined   Skilled Therapeutic Intervention Session 1: Upon entering the room, pt sleeping soundly in bed and taking OT 10 minutes to wake pt for participation this session. Supine >sit with min A and pt ambulating to bathroom with min hand held assistance. Pt required min A for standing balance and min cuing for pt to attend to R UE for clothing management. Pt returning to sit on EOB to don UB clothing with min A and LB clothing with mod A and min A for standing balance during LB clothing management. OT educated pt on OT purpose, POC, and goals. Pt transferred to recliner chair with min A and chair alarm donned for safety. Pt did say, " I want to get back into bed." OT educated pt on purpose and benefits of being out of bed and pt agreeable to remaining in recliner chair. Call bell and all needed items within reach upon exiting the room.   Session 2: Upon entering the room, pt sleeping soundly in recliner chair. Pt with no signs or symptoms of pain. Pt would not open eyes and appearing very lethargic. Only grunted in response to questions. OT attempted to continue to encourage pt for participation but he was not alert this session. OT repositioned pt for safety. Chair alarm still donned and call bell within reach upon exiting the room.   OT Evaluation Precautions/Restrictions  Precautions Precautions: Fall Precaution Comments: Rt inattention Restrictions Weight Bearing Restrictions: No  Pain Pain Assessment Pain Scale: Faces Pain Score: 0-No pain Faces Pain Scale: No hurt Home Living/Prior Functioning Home Living Family/patient expects to be discharged to:: Group home Living Arrangements: Group Home Available Help at Discharge: Available 24 hours/day(group home attendants) Type of Home: Group Home Home Access: Level entry Home Layout: One level Bathroom Shower/Tub: Gaffer, Architectural technologist: Handicapped height Bathroom Accessibility: Yes  Lives  With: Other (Comment)(lives in group home in private room) Prior Function Comments: pt was independent prior at group home and liked to volunteer Vision Baseline Vision/History: Wears glasses Wears Glasses: At all times Patient Visual Report: No change from baseline Additional Comments: Pt demonstrates ability to follow commands to track with eyes and they  appear to track in all directions. However, L eye laterally deviated. Cognition Overall Cognitive Status: No family/caregiver present to determine baseline cognitive functioning Arousal/Alertness: Lethargic Orientation Level: Nonverbal/unable to assess Immediate Memory Recall: (unable to answer) Memory Recall: (expressive difficulties) Sensation Sensation Light Touch: Appears Intact Proprioception: Not tested Stereognosis: Not tested Coordination Gross Motor Movements are Fluid and Coordinated: No Fine Motor Movements are Fluid and Coordinated: No Motor  Motor Motor: Hemiplegia Mobility  Bed Mobility Bed Mobility: Rolling Right;Supine to Sit;Sit to Supine Rolling Right: Minimal Assistance - Patient > 75% Supine to Sit: Moderate Assistance - Patient 50-74% Sit to Supine: Moderate Assistance - Patient 50-74% Transfers Sit to Stand: Minimal Assistance - Patient > 75%  Trunk/Postural Assessment  Cervical Assessment Cervical Assessment: Exceptions to WFL(forward head) Thoracic Assessment Thoracic Assessment: Exceptions to WFL(kyphotic) Lumbar Assessment Lumbar Assessment: Exceptions to WFL(posterior pelvic tilt) Postural Control Postural Control: Deficits on evaluation(delayed)  Balance Balance Balance Assessed: Yes Dynamic Sitting Balance Dynamic Sitting - Balance Support: Feet supported;No upper extremity supported Dynamic Sitting - Level of Assistance: 5: Stand by assistance Dynamic Standing Balance Dynamic Standing - Balance Support: During functional activity;Left upper extremity supported Dynamic Standing - Level  of Assistance: 4: Min assist;3: Mod assist Extremity/Trunk Assessment RUE Assessment RUE Assessment: Within Functional Limits/ R inattention LUE Assessment LUE Assessment: Within Functional Limits     Refer to Care Plan for Long Term Goals  Recommendations for other services: None    Discharge Criteria: Patient will be discharged from OT if patient refuses treatment 3 consecutive times without medical reason, if treatment goals not met, if there is a change in medical status, if patient makes no progress towards goals or if patient is discharged from hospital.  The above assessment, treatment plan, treatment alternatives and goals were discussed and mutually agreed upon: by patient  Gypsy Decant 07/20/2018, 10:22 AM

## 2018-07-20 NOTE — Plan of Care (Signed)
Due to the current state of emergency, patients may not be receiving their 3-hours of Medicare-mandated therapy.   

## 2018-07-20 NOTE — Progress Notes (Signed)
Patient information reviewed and entered into [facility] System by Becky Jeffren Dombek, PPS coordinator. Information including medical coding, function ability, and quality indicators will be reviewed and updated through discharge.   

## 2018-07-20 NOTE — Evaluation (Signed)
Physical Therapy Assessment and Plan  Patient Details  Name: Michael Mercado MRN: [id number] Date of Birth: 1940/08/08  PT Diagnosis: Abnormal posture, Abnormality of gait, Coordination disorder and Hemiplegia dominant Rehab Potential: Fair ELOS: 7-10 days    Today's Date: 07/20/2018 PT Individual Time: 1330-1430 PT Individual Time Calculation (min): 60 min    Problem List:  Patient Active Problem List   Diagnosis Date Noted  . Traumatic subdural hematoma ([place]) 07/19/2018  . Subdural hematoma ([place]) 07/07/2018  . Endotracheally intubated 07/07/2018  . Encounter for postanesthesia care 07/07/2018  . CKD (chronic kidney disease), stage II 03/18/2016  . HTN (hypertension), benign 03/18/2015  . COPD (chronic obstructive pulmonary disease) ([place]) 03/18/2015  . Intellectual disability 03/18/2015  . Schizophrenia ([place]) 03/18/2015  . Hyperlipidemia LDL goal <130 03/18/2015    Past Medical History:  Past Medical History:  Diagnosis Date  . Hypercholesteremia   . Hypertension   . Moderate intellectual disability   . Mood disorder ([place])   . MR (mental retardation)   . Obesity    Past Surgical History:  Past Surgical History:  Procedure Laterality Date  . CRANIOTOMY Left 07/07/2018   Procedure: CRANIOTOMY HEMATOMA EVACUATION SUBDURAL;  Surgeon: Eustace Moore, MD;  Location: [facility];  Service: Neurosurgery;  Laterality: Left;  . CRANIOTOMY Left 07/13/2018   Procedure: Redo Left CRANIOTOMY FOR SUBDURAL HEMATOMA;  Surgeon: Eustace Moore, MD;  Location: [facility];  Service: Neurosurgery;  Laterality: Left;    Assessment & Plan Clinical Impression: Patient is a 78 year old right-handed male with history of hypertension on Norvasc 10 mg daily, hyperlipidemia, mild mental retardation with anxiety/schizophrenia maintained on Klonopin 0.5 mg daily as needed, Risperdal 1 mg twice a day as well as Paxil 20 mg daily at bedtime and also history of tobacco abuse. Per report patient lives at [facility]  [facility]where he has resided at [facility] number of years. He was able to manage basic ADLs prior to admissionand was also doing some simple volunteer work. Presented 07/07/2018 after a fall getting out of the shower. Per report patient did strike his head but did not lose consciousness. Cranial CT scan showed a large mixed attenuation left whole low hemispheric subdural hematoma measuring 2.8 cm in maximal diameter with associated extensive mass effect and left-to-right midline shift. Underwent left craniotomy for evacuation of subdural hematoma 07/07/2018 per Dr. Sherley Bounds. He was extubated 07/07/2018 and maintained on Keppra for seizure prophylaxis. On 07/13/2018 patient more difficult to arouse was not moving his right arm as well as some increased facial weakness. Follow-up neurosurgery felt need for reexploration and underwent redo left craniotomy for evacuation of acute subdural hematoma 07/13/2018. Patient did require nasogastric tube for nutritional support. His diet has been advanced to dysphagia #1 thin liquid. Follow-up cranial CT scan 07/14/2018 showed decreased subdural blood on the left with subdural drain. Since been removed. Decreased mass effect and midline shift now 6 mm as compared to 13 mm on prior tracings. Patient's level of alertness has continued to improve. Therapy   Patient transferred to CIR on 07/19/2018 .   Patient currently requires min with mobility secondary to muscle weakness, muscle joint tightness and muscle paralysis, impaired timing and sequencing, abnormal tone and ataxia, decreased visual perceptual skills and decreased visual motor skills, decreased attention to right, left side neglect, decreased motor planning and ideational apraxia, decreased attention, decreased awareness, decreased problem solving, decreased safety awareness, decreased memory and delayed processing and decreased sitting balance, decreased standing balance, decreased postural control, hemiplegia  and  decreased balance strategies.  Prior to hospitalization, patient was modified independent  with mobility and lived with Other (Comment)(lives in group home in private room) in a [facility] home.  Home access is  Level entry.  Patient will benefit from skilled PT intervention to maximize safe functional mobility, minimize fall risk and decrease caregiver burden for planned discharge home with 24 hour assist.  Anticipate patient will benefit from follow up [facility] at discharge.  PT - End of Session Activity Tolerance: Tolerates < 10 min activity, no significant change in vital signs Endurance Deficit: Yes Endurance Deficit Description: multiple rest breaks secondary to fatigue PT Assessment Rehab Potential (ACUTE/IP ONLY): Fair PT Barriers to Discharge: Decreased caregiver support;Home environment access/layout;Lack of/limited family support;Medication compliance PT Patient demonstrates impairments in the following area(s): Balance;Behavior;Endurance;Motor;Perception;Safety;Sensory;Skin Integrity PT Transfers Functional Problem(s): Bed Mobility;Bed to Chair;Car;Furniture;Floor PT Locomotion Functional Problem(s): Ambulation;Wheelchair Mobility;Stairs PT Plan PT Intensity: Minimum of 1-2 x/day ,45 to 90 minutes PT Frequency: 5 out of 7 days PT Duration Estimated Length of Stay: 7-10 days  PT Treatment/Interventions: Cognitive remediation/compensation;Ambulation/gait training;Balance/vestibular training;Discharge planning;Disease management/prevention;Community reintegration;Functional electrical stimulation;DME/adaptive equipment instruction;Functional mobility training;Neuromuscular re-education;Pain management;Patient/family education;Psychosocial support;Skin care/wound management;Splinting/orthotics;Stair training;Therapeutic Exercise;Therapeutic Activities;UE/LE Strength taining/ROM;UE/LE Coordination activities;Visual/perceptual remediation/compensation;Wheelchair propulsion/positioning PT Transfers  Anticipated Outcome(s): Supervision assist with LRAD  PT Locomotion Anticipated Outcome(s): CGA with LRAD at house hold level  PT Recommendation Follow Up Recommendations: Home health PT Patient destination: Home  Skilled Therapeutic Intervention Pt received sitting in [facility] and agreeable to PT. PT instructed patient in PT Evaluation and initiated treatment intervention; see below for results. PT educated patient in [place], rehab potential, rehab goals, and discharge recommendations. Pt requires min assist for gait with and without AD, with assist for AD management. Stair management with min assist for ascent and mod assist for descent. Min assist for car transfer and increased time. Pt noted to have small incontinent bowel movement, PT assisted pt to don and doff pants with min assist for safety. Patient returned to room and left sitting in [facility] with call bell in reach and all needs met.       PT Evaluation Precautions/Restrictions Precautions Precautions: Fall Precaution Comments: Rt inattention Restrictions Weight Bearing Restrictions: No  Pain Pain Assessment Pain Scale: 0-10 Pain Score: 0-No pain Home Living/Prior Functioning Home Living Available Help at Discharge: Available 24 hours/day(group home attendants) Type of Home: Group Home Home Access: Level entry Home Layout: One level Bathroom Shower/Tub: Walk-in Sales promotion account executive: Handicapped height Bathroom Accessibility: Yes  Lives With: Other (Comment)(lives in group home in private room) Prior Function Comments: pt was independent prior at group home and liked to volunteer Vision/Perception  Vision - Assessment Additional Comments: mild difficulty following commands for traking. no tracking in the L eye noted.  Perception Perception: Within Functional Limits  Cognition Overall Cognitive Status: No family/caregiver present to determine baseline cognitive functioning Arousal/Alertness: Awake/alert Orientation  Level: Oriented to person;Oriented to place;Oriented to situation Awareness: Impaired Problem Solving: Impaired Behaviors: Restless Safety/Judgment: Impaired Sensation Sensation Light Touch: Appears Intact Proprioception: Not tested Stereognosis: Not tested Coordination Gross Motor Movements are Fluid and Coordinated: No Fine Motor Movements are Fluid and Coordinated: No Coordination and Movement Description: dysmetric R and L Motor  Motor Motor: Hemiplegia  Mobility Bed Mobility Bed Mobility: Rolling Right;Supine to Sit;Sit to Supine Rolling Right: Minimal Assistance - Patient > 75% Supine to Sit: Moderate Assistance - Patient 50-74% Sit to Supine: Minimal Assistance - Patient > 75% Transfers Transfers: Sit to [employer] Transfers Sit to  Stand: Minimal Assistance - Patient > 75% Stand Pivot Transfers: Minimal Assistance - Patient > 75% Stand Pivot Transfer Details: Verbal cues for precautions/safety;Verbal cues for technique;Verbal cues for sequencing Transfer (Assistive device): 1 person hand held assist Locomotion  Gait Ambulation: Yes Gait Assistance: Minimal Assistance - Patient > 75% Gait Distance (Feet): 140 Feet Assistive device: 1 person hand held assist Gait Gait: Yes Gait Pattern: Impaired Gait Pattern: Shuffle;Ataxic;Wide base of support Stairs / Additional Locomotion Stairs: Yes Stairs Assistance: Moderate Assistance - Patient 50 - 74% Stair Management Technique: Two rails Number of Stairs: 4 Height of Stairs: 6 Wheelchair Mobility Wheelchair Mobility: Yes Wheelchair Assistance: Moderate Assistance - Patient 50 - 74% Wheelchair Propulsion: Both upper extremities Wheelchair Parts Management: Needs assistance Distance: 25  Trunk/Postural Assessment  Cervical Assessment Cervical Assessment: Exceptions to WFL(forward head) Thoracic Assessment Thoracic Assessment: Exceptions to WFL(kyphotic) Lumbar Assessment Lumbar Assessment: Exceptions to  WFL(posterior pelvic tilt) Postural Control Postural Control: Deficits on evaluation(delayed)  Balance Dynamic Sitting Balance Dynamic Sitting - Balance Support: Feet supported;No upper extremity supported Dynamic Sitting - Level of Assistance: 5: Stand by assistance Dynamic Standing Balance Dynamic Standing - Balance Support: During functional activity;Left upper extremity supported Dynamic Standing - Level of Assistance: 3: Mod assist;4: Min assist Extremity Assessment  RUE Assessment RUE Assessment: Within Functional Limits LUE Assessment LUE Assessment: Within Functional Limits RLE Assessment RLE Assessment: Exceptions to [facility] General Strength Comments: functionally 4/5 proximal to distal  LLE Assessment LLE Assessment: Exceptions to [facility] General Strength Comments: functionally 4/5 proximal to distal     Refer to Care Plan for Long Term Goals  Recommendations for other services: None   Discharge Criteria: Patient will be discharged from PT if patient refuses treatment 3 consecutive times without medical reason, if treatment goals not met, if there is a change in medical status, if patient makes no progress towards goals or if patient is discharged from hospital.  The above assessment, treatment plan, treatment alternatives and goals were discussed and mutually agreed upon: by patient  Lorie Phenix 07/20/2018, 3:25 PM

## 2018-07-20 NOTE — Progress Notes (Signed)
[facility] PHYSICAL MEDICINE & REHABILITATION PROGRESS NOTE   Subjective/Complaints: No apparent issues overnight. Sleeping comfortably when I arrived.   ROS: Limited due to cognitive/behavioral    Objective:   No results found. Recent Labs    07/20/18 0549  WBC 11.0*  HGB 10.0*  HCT 31.7*  PLT 290   Recent Labs    07/20/18 0549  NA 139  K 4.4  CL 107  CO2 27  GLUCOSE 89  BUN 27*  CREATININE 1.11  CALCIUM 8.5*    Intake/Output Summary (Last 24 hours) at 07/20/2018 0949 Last data filed at 07/20/2018 0700 Gross per 24 hour  Intake 480 ml  Output 100 ml  Net 380 ml     Physical Exam: Vital Signs Blood pressure (!) 148/66, pulse 68, temperature 98 F (36.7 C), temperature source Oral, resp. rate 19, height 5\' 6"  (1.676 m), weight 76.9 kg, SpO2 100 %.  Constitutional: No distress . Vital signs reviewed. Aroused easily HEENT: EOMI, oral membranes moist, crani scar Neck: supple Cardiovascular: RRR without murmur. No JVD    Respiratory: CTA Bilaterally without wheezes or rales. Normal effort    GI: BS +, non-tender, non-distended     Musculoskeletal:  General: No edema.  Neurological: Arouses easily. Follows simple commands. Does seem to comprehend intermittently. Expressive language deficits. . Lateral deviation of left eye. Able to look up and down.  Moves all 4s, left more than right and withdraws to pain on all 4's.---motor sensory unchanged.  Skin: Skin iswarm.  Psychiatric: confused   Assessment/Plan: 1. Functional deficits secondary to TBI which require 3+ hours per day of interdisciplinary therapy in a comprehensive inpatient rehab setting.  Physiatrist is providing close team supervision and 24 hour management of active medical problems listed below.  Physiatrist and rehab team continue to assess barriers to discharge/monitor patient progress toward functional and medical goals  Care Tool:  Bathing              Bathing assist       Upper Body Dressing/Undressing Upper body dressing   What is the patient wearing?: Hospital gown only    Upper body assist Assist Level: Minimal Assistance - Patient > 75%    Lower Body Dressing/Undressing Lower body dressing      What is the patient wearing?: Incontinence brief     Lower body assist Assist for lower body dressing: Minimal Assistance - Patient > 75%     Toileting Toileting    Toileting assist Assist for toileting: Moderate Assistance - Patient 50 - 74%     Transfers Chair/bed transfer  Transfers assist     Chair/bed transfer assist level: Minimal Assistance - Patient > 75%     Locomotion Ambulation   Ambulation assist              Walk 10 feet activity   Assist           Walk 50 feet activity   Assist           Walk 150 feet activity   Assist           Walk 10 feet on uneven surface  activity   Assist           Wheelchair     Assist               Wheelchair 50 feet with 2 turns activity    Assist            Wheelchair 150  feet activity     Assist         Medical Problem List and Plan: 1.Decreased functional mobility with right side weaknesssecondary to traumatic left subdural hematoma. Status post craniotomy evacuation 07/07/2018 followed by reaccumulation of SDH with redo craniotomy 07/13/2018 -Patient is beginning CIR therapies today including PT and OT, SLP  2. Antithrombotics: -DVT/anticoagulation:SCDs -antiplatelet therapy: N/A 3. Pain Management:Tylenol as needed 4. Mood/mild mental retardation with schizophrenia:Patient on Klonopin 0.5 mg daily prior to admission and Paxil 20 mg daily, Risperdal1 mg twice a day. Resume as needed -antipsychotic agents: N/A  -sleep chart  -ritalin trial  -limit neurosedating meds as possible 5. Neuropsych: This patienthe has notcapable of making decisions on hisown behalf. 6.  Skin/Wound Care:Routine skin checks 7. Fluids/Electrolytes/Nutrition:  -encourage po  -I personally reviewed the patient's labs today.  BUN/cr increased  -may need to supp with IVF given diet and cognitive status 8. Seizure prophylaxis. Keppra 500 mg twice a day 9. Dysphagia. Dysphagia #1 thin liquids. Follow-up speech therapy 10.Tobacco abuse. Counseling     LOS: 1 days A FACE TO FACE EVALUATION WAS PERFORMED  Ranelle Oyster 07/20/2018, 9:49 AM

## 2018-07-20 NOTE — Evaluation (Addendum)
Speech Language Pathology Assessment and Plan  Patient Details  Name: Michael Mercado MRN: [id number] Date of Birth: Apr 04, 1941  SLP Diagnosis: Dysphagia  Rehab Potential: Fair ELOS: 4/8    Today's Date: 07/20/2018 SLP Individual Time: 3086-5784 SLP Individual Time Calculation (min): 60 min   Problem List:  Patient Active Problem List   Diagnosis Date Noted  . Traumatic subdural hematoma ([place]) 07/19/2018  . Subdural hematoma ([place]) 07/07/2018  . Endotracheally intubated 07/07/2018  . Encounter for postanesthesia care 07/07/2018  . CKD (chronic kidney disease), stage II 03/18/2016  . HTN (hypertension), benign 03/18/2015  . COPD (chronic obstructive pulmonary disease) ([place]) 03/18/2015  . Intellectual disability 03/18/2015  . Schizophrenia ([place]) 03/18/2015  . Hyperlipidemia LDL goal <130 03/18/2015   Past Medical History:  Past Medical History:  Diagnosis Date  . Hypercholesteremia   . Hypertension   . Moderate intellectual disability   . Mood disorder ([place])   . MR (mental retardation)   . Obesity    Past Surgical History:  Past Surgical History:  Procedure Laterality Date  . CRANIOTOMY Left 07/07/2018   Procedure: CRANIOTOMY HEMATOMA EVACUATION SUBDURAL;  Surgeon: Michael Moore, MD;  Location: [facility];  Service: Neurosurgery;  Laterality: Left;  . CRANIOTOMY Left 07/13/2018   Procedure: Redo Left CRANIOTOMY FOR SUBDURAL HEMATOMA;  Surgeon: Michael Moore, MD;  Location: [facility];  Service: Neurosurgery;  Laterality: Left;    Assessment / Plan / Recommendation Clinical Impression   Michael Mercado is a 78 year old right-handed male with history of hypertension, hyperlipidemia, mild mental retardation with anxiety/schizophrenia maintained on Klonopin 0.5 mg daily as needed, Risperdal 1 mg twice a day as well as Paxil 20 mg daily at bedtime and also history of tobacco abuse. Per report patient lives at [facility] group home where he has resided at for number of years. He was  able to manage basic ADLs prior to admission and was also doing some simple volunteer work. Presented 07/07/2018 after a fall getting out of the shower. Per report patient did strike his head but did not lose consciousness. Cranial CT scan showed a large mixed attenuation left whole low hemispheric subdural hematoma measuring 2.8 cm in maximal diameter with associated extensive mass effect and left-to-right midline shift. Underwent left craniotomy for evacuation of subdural hematoma 07/07/2018 per Dr. Sherley Mercado. He was extubated 07/07/2018 and maintained on Keppra for seizure prophylaxis. On 07/13/2018 patient more difficult to arouse was not moving his right arm as well as some increased facial weakness. Follow-up neurosurgery felt need for reexploration and underwent redo left craniotomy for evacuation of acute subdural hematoma 07/13/2018. Patient did require nasogastric tube for nutritional support. His diet has been advanced to dysphagia #1 thin liquid. Follow-up cranial CT scan 07/14/2018 showed decreased subdural blood on the left with subdural drain. Since been removed. Decreased mass effect and midline shift now 6 mm as compared to 13 mm on prior tracings. Patient's level of alertness has continued to improve. Therapy evaluations ongoing with recommendations of physical medicine rehabilitation consult. Patient was admitted for a comprehensive of rehabilitation program on 07/19/18.    Bedside swallow and cognitive linguistic evaluations were completed on 07/20/18.  At this time, pt's cognitve linguistic baseline is unknown. However during this evaluation pt is conversant at the word to phrase level, moderately unintelligible speech, able to follow simple 1 step directions, sustain attention to task and can communicate his wants and needs verbally (moderatley unintelligible speech). No obvious visual field deficits noted. Before diagnosing pt  with acute cognitive impairments, will contact group home for  further information regarding baseline abilities. Pt presents with mild dysphagia c/b disorganized oral phase, atypical anterior lingual movements resulting in significantly decreased bolus cohesion. He presents with no overt s/s of aspiration when chugging thin liquids. Pt is impulsive and despite Total A he is not able to decreased rate or bolus size of puree or thin liquids. Per chart review, when pt demonstrated impulsive large consecutive sips of thin liquids he presented with cough. During this session pt was free of overt s/s of aspiration when chugging thin liquids. At this time, skilled ST is required to target safe progression of diet.    Skilled Therapeutic Interventions          Skilled treatment session focused on completion of bedside swallow and cognitive linguistic evaluation, see above. Education provided to nursing staff on current diet and aspiration precautions. Pt consumed thin liquids via cup with fast rate (chugged them) with no overt s/s of aspiration.    SLP Assessment  Patient will need skilled Speech Lanaguage Pathology Services during CIR admission    Recommendations  SLP Diet Recommendations: Dysphagia 1 (Puree);Thin Liquid Administration via: Cup Medication Administration: Whole meds with liquid Supervision: Full supervision/cueing for compensatory strategies Compensations: Minimize environmental distractions;Slow rate;Small sips/bites Postural Changes and/or Swallow Maneuvers: Seated upright 90 degrees;Upright 30-60 min after meal Oral Care Recommendations: Oral care BID Patient destination: (group home) Follow up Recommendations: Home Health SLP Equipment Recommended: None recommended by SLP    SLP Frequency 3 to 5 out of 7 days   SLP Duration  SLP Intensity  SLP Treatment/Interventions 4/8  Minumum of 1-2 x/day, 30 to 90 minutes  Dysphagia/aspiration precaution training    Pain Pain Assessment Pain Scale: 0-10 Pain Score: 0-No pain  Prior  Functioning Cognitive/Linguistic Baseline: Information not available Type of Home: Group Home  Lives With: (lives in a group home) Available Help at Discharge: Available 24 hours/day  Short Term Goals: Week 1: SLP Short Term Goal 1 (Week 1): Pt will consume thin liquids with minimal overt s/s of aspiration with supervision cues for use of compensatory swallow strategies.  SLP Short Term Goal 2 (Week 1): Pt will consume trials of dysphagia 2 with minimal overt s/s of aspiration/dysphagia nnd supervision cues.   Refer to Care Plan for Long Term Goals  Recommendations for other services: None   Discharge Criteria: Patient will be discharged from SLP if patient refuses treatment 3 consecutive times without medical reason, if treatment goals not met, if there is a change in medical status, if patient makes no progress towards goals or if patient is discharged from hospital.  The above assessment, treatment plan, treatment alternatives and goals were discussed and mutually agreed upon: by patient  Maydelin Deming 07/20/2018, 3:59 PM

## 2018-07-21 ENCOUNTER — Inpatient Hospital Stay (HOSPITAL_COMMUNITY): Payer: Medicare Other | Admitting: Speech Pathology

## 2018-07-21 ENCOUNTER — Inpatient Hospital Stay (HOSPITAL_COMMUNITY): Payer: Medicare Other | Admitting: Occupational Therapy

## 2018-07-21 ENCOUNTER — Inpatient Hospital Stay (HOSPITAL_COMMUNITY): Payer: Medicare Other | Admitting: Physical Therapy

## 2018-07-21 MED ORDER — PHENAZOPYRIDINE HCL 100 MG PO TABS
100.0000 mg | ORAL_TABLET | Freq: Three times a day (TID) | ORAL | Status: AC
  Administered 2018-07-21 – 2018-07-22 (×3): 100 mg via ORAL
  Filled 2018-07-21 (×3): qty 1

## 2018-07-21 NOTE — Progress Notes (Signed)
Occupational Therapy Session Note  Patient Details  Name: Michael Mercado MRN: [id number] Date of Birth: July 21, 1940  Today's Date: 07/21/2018 OT Individual Time: 0830-0900 OT Individual Time Calculation (min): 30 min    Short Term Goals: Week 1:  OT Short Term Goal 1 (Week 1): STGs=LTGs secondary to estimated short LOS  Skilled Therapeutic Interventions/Progress Updates:    Pt seen this session for ADL training with a focus on balance and use of RUE.  Pt received in bed stating he needed to use the bathroom.  Pt sat to EOB with S and then ambulated to bathroom with RW with CGA. Pt tended to want to carry the walker vs rolling it, needed frequent cues.    Pt used bathroom and had a BM , pt self cleansed and was able to manage clothing over hips.  Suggested pt take a quick shower.  Pt said he needed to go back to bed, but with some prompting pt agreed to shower. Transferred into shower to sit on bench and bathed with min A to do feet and min cues to wash all body parts. Good active use of RUE.   Pt then ambulated without RW to bed with CGA. He donned pants with min A over feet and then pulled them up.  Mod A with socks (OT started 50% over foot and pt finished), and min A with shirt and he demonstrated R UE inattention.  Pt layed down with S to rest for next session. Bed alarm set and all needs met.    Therapy Documentation Precautions:  Precautions Precautions: Fall Precaution Comments: Rt inattention Restrictions Weight Bearing Restrictions: No      Pain: Pain Assessment Pain Score: 0-No pain    Therapy/Group: Individual Therapy  [name] 07/21/2018, 9:11 AM

## 2018-07-21 NOTE — Progress Notes (Signed)
Physical Therapy Session Note  Patient Details  Name: Michael Mercado MRN: [id number] Date of Birth: 08-14-1940  Today's Date: 07/21/2018 PT Individual Time: 0930-1045 PT Individual Time Calculation (min): 75 min   Short Term Goals: Week 1:  PT Short Term Goal 1 (Week 1): STG=LTG due to ELOS  Skilled Therapeutic Interventions/Progress Updates:   Pt received supine in bed and agreeable to PT. Supine>sit transfer with supervision assist and for use of the rail in the L.   Ambulatory transfer to toliet with RW and min assist from PT to manage RW and avoid obstacles on the R. Sit<>stand from toilet with supervision assist from PT for safety. Clothing management with supervision assist increased time. Ambulatory transfer to [facility] with CGA and cues for awareness of obstacles.   Standing balance/tolerance to performed Dynavision program A, 3 rings, 1 minute, x 3.  1) score 7; reaction time: L 8sec, R 7 2)score 6; reaction time: L 7sec, R 9.5 3) score 6; reaction time: L sec, R 9 Min assist from PT fot safety with lateral reaches as well as moder-max cues for visual scanning to the R, decreased cues from PT on each bout with decreased attention to the R.   PT instructed pt in Gait trainnig in hall x 265f with CGA and min cues for direction and obstacle awareness. Pt noted to have significantly improve step length and height on this day compared to previous PT treatment.   Nustep x 5 minutes with max cues for attention to task for more than 30 sec. [facility] and no AD with min cues for safety and assist improved trunk control.   Pt returned to room and performed ambulatory transfer to bed with no AD and min assist. Sit>supine completed with supervision asst and left supine in bed with call bell in reach and all needs met.           Therapy Documentation Precautions:  Precautions Precautions: Fall Precaution Comments: Rt inattention Restrictions Weight  Bearing Restrictions: No   Pain: Pain Assessment Pain Scale: 0-10 Pain Score: 0-No pain    Therapy/Group: Individual Therapy  ALorie Phenix4/05/2018, 10:51 AM

## 2018-07-21 NOTE — Progress Notes (Addendum)
Social Work Assessment and Plan  Patient Details  Name: Michael Mercado MRN: [id number] Date of Birth: [birth year]  Today's Date: 07/21/2018  Problem List:  Patient Active Problem List   Diagnosis Date Noted  . Traumatic subdural hematoma ([place]) 07/19/2018  . Subdural hematoma ([place]) 07/07/2018  . Endotracheally intubated 07/07/2018  . Encounter for postanesthesia care 07/07/2018  . CKD (chronic kidney disease), stage II 03/18/2016  . HTN (hypertension), benign 03/18/2015  . COPD (chronic obstructive pulmonary disease) ([place]) 03/18/2015  . Intellectual disability 03/18/2015  . Schizophrenia ([place]) 03/18/2015  . Hyperlipidemia LDL goal <130 03/18/2015   Past Medical History:  Past Medical History:  Diagnosis Date  . Hypercholesteremia   . Hypertension   . Moderate intellectual disability   . Mood disorder ([place])   . MR (mental retardation)   . Obesity    Past Surgical History:  Past Surgical History:  Procedure Laterality Date  . CRANIOTOMY Left 07/07/2018   Procedure: CRANIOTOMY HEMATOMA EVACUATION SUBDURAL;  Surgeon: Eustace Moore, MD;  Location: [facility];  Service: Neurosurgery;  Laterality: Left;  . CRANIOTOMY Left 07/13/2018   Procedure: Redo Left CRANIOTOMY FOR SUBDURAL HEMATOMA;  Surgeon: Eustace Moore, MD;  Location: [facility];  Service: Neurosurgery;  Laterality: Left;   Social History:  reports that he has been smoking cigarettes. He has been smoking about 0.33 packs per day. He has never used smokeless tobacco. He reports that he does not drink alcohol or use drugs.  Family / Support Systems Marital Status: Single Patient Roles: Other (Comment)(uncle; housemate) Other Supports: Edsel Petrin - niece - [phone]) [phone]; [name] Caple - QP - [phone]; [name] Wilson - house parent - [phone] Anticipated Caregiver: Staff at the group home Ability/Limitations of Caregiver: Will need to be re-evaluated prior to return to group home by Erby Pian with admissions Caregiver Availability: Intermittent Family Dynamics: Pt has been at group home for "several" years per Ms. Caple.  Pt is very used to group home and staff have a connection with im.  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   Abuse/Neglect Abuse/Neglect Assessment Can Be Completed: Unable to assess, patient is non-responsive or altered mental status(Pt was under his blanket and then asked to use the bathroom.  CSW will continue to try to assess this with pt.)  Emotional Status Pt's affect, behavior and adjustment status: Pt was pleasant and polite with CSW, but quickly during visit needed to use the bathroom.  CSW received more information from Kelli[place] at the group home. Recent Psychosocial Issues: fell in bathroom and hit his head Psychiatric History: Pt with anxiety and schizophrenia. Substance Abuse History: none reported  Patient / Family Perceptions, Expectations & Goals Pt/Family understanding of illness & functional limitations: Pt's group home staff reports pt's speech seems back at his baseline and that he sounds well.  Unsure what his understanding of his condition is due to pre-existing mental/developmental disabillites and now SDH deficits. Premorbid pt/family roles/activities: Pt did volunteer work at various sites and was a member of the group home with those responsibilities. Anticipated changes in roles/activities/participation: Group home staff hopes pt will eventually be able to resume activities. Pt/family expectations/goals: Pt did  not set goal, but Ms. Caple was hoping  pt would get back as close to his baseline as possible.  Community Resources [employer]: Other (Comment)([facility]; [facility]) Premorbid Home Care/DME Agencies: Other (Comment)(Pt's housemate has used a HH agency before and they would like to use the same agency.) Transportation available at discharge: group home Resource referrals recommended: Support group (specify)  Discharge Planning Living Arrangements: [place]: Other (Comment), Other relatives, Case manager/social worker(group home staff) Case Manager/Social Worker Name: [facility] Type of Residence: [place] Name: Other (enter name of facility below) Care Facility Name: [facility] Resources: Medicare, [place] (specify county) Financial Resources: SSI Financial Screen Referred: No Living Expenses: Other (Comment) Money Management: Other (Comment)(group home staff) Does the patient have any problems obtaining your medications?: No Home Management: group home residents share appropriate chores Patient/Family Preliminary Plans: Pt would like to return to group home if they can manage his level of care needed. Social Work Anticipated Follow Up Needs: HH/OP, Support Group Expected length of stay: 7 to 10 days  Clinical Impression CSW met with pt briefly and spoke with pt's group home manager via telephone to introduce self and role of CSW, as well as to complete assessment.  CSW will also talk with niece to receive any additional information.  Group Home would like him back if they can meet his level of care.  They will come to assess pt next week, closer to d/c.  Pt was polite with CSW when he needed to end the visit to use the bathroom.  He is working hard with therapists and is making progress, but may need CGA and CSW is not sure group home can meet his needs.  CSW will continue to follow and assist as  needed.  Karita Dralle, Silvestre Mesi 07/21/2018, 11:48 PM

## 2018-07-21 NOTE — Progress Notes (Signed)
Speech Language Pathology Daily Session Note  Patient Details  Name: Michael Mercado MRN: [id number] Date of Birth: 30-Apr-1940  Today's Date: 07/21/2018 SLP Individual Time: 1115-1200 SLP Individual Time Calculation (min): 45 min  Short Term Goals: Week 1: SLP Short Term Goal 1 (Week 1): Pt will consume thin liquids with minimal overt s/s of aspiration with supervision cues for use of compensatory swallow strategies.  SLP Short Term Goal 2 (Week 1): Pt will consume trials of dysphagia 2 with minimal overt s/s of aspiration/dysphagia dn supervision cues.   Skilled Therapeutic Interventions:  SLP contacted pt's group home in effort to gain information regarding baseline ability. Additionally, SLP called his favorite caregiver Michael Mercado) during session for him to talk with her. She confirmed that pt's interaction and speech sounded baseline. Therefore, cognitive linguistic goals were not added. Additionally, they state pt eats at fast rate and "really ejoys his food and eating." Therefore it is doubtful that pt's rate and bolus size will be changed so diet recommendation will need to be considerate of these perimeters. Education provided to team on pt's PLOF. Pt also consumed thin liquids via cup without overt s/s of aspiration. He continues to consume large consecutive sips. At the end of session pt stated that he needed to pee and was able to stand by bed and use urinal. Pt left upright in bed, bed alarm on and all needs within reach. Continue per current plan of care.      Pain Pain Assessment Pain Scale: 0-10 Pain Score: 0-No pain  Therapy/Group: Individual Therapy  Michael Mercado 07/21/2018, 12:09 PM

## 2018-07-21 NOTE — Progress Notes (Signed)
Occupational Therapy Session Note  Patient Details  Name: Michael Mercado MRN: [id number] Date of Birth: Nov 01, 1940  Today's Date: 07/21/2018 OT Individual Time: 7371-0626 OT Individual Time Calculation (min): 39 min  and Today's Date: 07/21/2018 OT Missed Time: 35 Minutes Missed Time Reason: Pain   Short Term Goals: Week 1:  OT Short Term Goal 1 (Week 1): STGs=LTGs secondary to estimated short LOS  Skilled Therapeutic Interventions/Progress Updates:    Upon entering the room, pt sleeping soundly but agreeable to OT intervention. Supine >sit with supervision with increased time and cuing. Pt ambulating with CGA without use of AD to bathroom for toileting. Pt able to manage LB clothing with use of B UEs and min guard for standing balance. Pt able to void and exiting the bathroom in same manner to sink for hand hygiene with min guard for standing balance and min cuing for initiation. Pt returning to EOB as lunch tray has arrived. Pt needing set up A to open items on meal tray and needing mod cuing for small bites and sips as pt attempts to put exceedingly large amounts of food into mouth. When asking about pain the pt taps head and states, " head hurts real bad." and requesting to return to bed. OT notified RN who arrived with  Medication. Pt declined to continue and verbalized repeatedly, " stay in bed. Stay in bed." Bed alarm activated and mat placed on floor for pt safety.   Therapy Documentation Precautions:  Precautions Precautions: Fall Precaution Comments: Rt inattention Restrictions Weight Bearing Restrictions: No General: General OT Amount of Missed Time: 35 Minutes   Therapy/Group: Individual Therapy  Alen Bleacher 07/21/2018, 1:53 PM

## 2018-07-21 NOTE — Progress Notes (Signed)
[facility] Individual Statement of Services  Patient Name:  Michael Mercado  Date:  07/21/2018  Welcome to the [facility].  Our goal is to provide you with an individualized program based on your diagnosis and situation, designed to meet your specific needs.  With this comprehensive rehabilitation program, you will be expected to participate in at least 3 hours of rehabilitation therapies Monday-Friday, with modified therapy programming on the weekends.  Your rehabilitation program will include the following services:  Physical Therapy (PT), Occupational Therapy (OT), Speech Therapy (ST), 24 hour per day rehabilitation nursing, Case Management (Social Worker), Rehabilitation Medicine, Nutrition Services and Pharmacy Services  Weekly team conferences will be held on Wednesdays to discuss your progress.  Your Social Worker will talk with you frequently to get your input and to update you on team discussions.  Team conferences with you and your family in attendance may also be held.  Expected length of stay: 7 to 10 days  Overall anticipated outcome:  Supervision to contact guard assist  Depending on your progress and recovery, your program may change. Your Social Worker will coordinate services and will keep you informed of any changes. Your Social Worker's name and contact numbers are listed  below.  The following services may also be recommended but are not provided by the [facility]:   Driving Evaluations  Home Health Rehabiltiation Services  Outpatient Rehabilitation Services  Vocational Rehabilitation   Arrangements will be made to provide these services after discharge if needed.  Arrangements include referral to agencies that provide these services.  Your insurance has been verified to be:  Medicare and Medicaid Your primary doctor is:  Dr. Ivin Booty Dettinger  Pertinent information will be shared with your doctor and  your insurance company.  Social Worker:  Staci Acosta, LCSW  [phone] or [phone]  Information discussed with and copy given to patient by: Elvera Lennox, 07/21/2018, 11:53 PM

## 2018-07-21 NOTE — Progress Notes (Signed)
[facility] PHYSICAL MEDICINE & REHABILITATION PROGRESS NOTE   Subjective/Complaints: Pt is awake and alert. Has some tenderness at crani site. Asking for a cup of coffee.   ROS: Limited due to cognitive/behavioral     Objective:   No results found. Recent Labs    07/20/18 0549  WBC 11.0*  HGB 10.0*  HCT 31.7*  PLT 290   Recent Labs    07/20/18 0549  NA 139  K 4.4  CL 107  CO2 27  GLUCOSE 89  BUN 27*  CREATININE 1.11  CALCIUM 8.5*    Intake/Output Summary (Last 24 hours) at 07/21/2018 1017 Last data filed at 07/21/2018 0653 Gross per 24 hour  Intake 480 ml  Output 700 ml  Net -220 ml     Physical Exam: Vital Signs Blood pressure (!) 120/48, pulse (!) 57, temperature 98.5 F (36.9 C), resp. rate 18, height 5\' 6"  (1.676 m), weight 76.9 kg, SpO2 100 %.  Constitutional: No distress . Vital signs reviewed. HEENT: EOMI, oral membranes moist Neck: supple Cardiovascular: RRR without murmur. No JVD    Respiratory: CTA Bilaterally without wheezes or rales. Normal effort    GI: BS +, non-tender, non-distended  Musculoskeletal:  General: No edema.  Neurological: Awake,alert. Follows basic commands. Speech dysarthric but able to communicate with phrases and short sentences. Lateral deviation of left eye. Able to look up and down.  Moves all 4s, left--at least 4/5, 2-3/5 RUE and RLE Skin: Skin iswarm.  Psychiatric:alert and cooperative   Assessment/Plan: 1. Functional deficits secondary to TBI which require 3+ hours per day of interdisciplinary therapy in a comprehensive inpatient rehab setting.  Physiatrist is providing close team supervision and 24 hour management of active medical problems listed below.  Physiatrist and rehab team continue to assess barriers to discharge/monitor patient progress toward functional and medical goals  Care Tool:  Bathing  Bathing activity did not occur: Safety/medical concerns Body parts bathed by patient: Right arm,  Left arm, Chest, Abdomen, Front perineal area, Buttocks, Right upper leg, Left upper leg   Body parts bathed by helper: Right lower leg, Left lower leg     Bathing assist Assist Level: Minimal Assistance - Patient > 75%     Upper Body Dressing/Undressing Upper body dressing   What is the patient wearing?: Pull over shirt    Upper body assist Assist Level: Minimal Assistance - Patient > 75%    Lower Body Dressing/Undressing Lower body dressing      What is the patient wearing?: Incontinence brief, Pants     Lower body assist Assist for lower body dressing: Minimal Assistance - Patient > 75%     Toileting Toileting    Toileting assist Assist for toileting: Contact Guard/Touching assist     Transfers Chair/bed transfer  Transfers assist     Chair/bed transfer assist level: Contact Guard/Touching assist     Locomotion Ambulation   Ambulation assist      Assist level: Minimal Assistance - Patient > 75% Assistive device: Hand held assist Max distance: 20'   Walk 10 feet activity   Assist     Assist level: Minimal Assistance - Patient > 75% Assistive device: Hand held assist   Walk 50 feet activity   Assist    Assist level: Minimal Assistance - Patient > 75% Assistive device: Hand held assist    Walk 150 feet activity   Assist Walk 150 feet activity did not occur: Refused         Walk 10  feet on uneven surface  activity   Assist Walk 10 feet on uneven surfaces activity did not occur: Refused         Wheelchair     Assist   Type of Wheelchair: Manual    Wheelchair assist level: Moderate Assistance - Patient 50 - 74% Max wheelchair distance: 25    Wheelchair 50 feet with 2 turns activity    Assist    Wheelchair 50 feet with 2 turns activity did not occur: Safety/medical concerns       Wheelchair 150 feet activity     Assist Wheelchair 150 feet activity did not occur: Safety/medical concerns       Medical  Problem List and Plan: 1.Decreased functional mobility with right side weaknesssecondary to traumatic left subdural hematoma. Status post craniotomy evacuation 07/07/2018 followed by reaccumulation of SDH with redo craniotomy 07/13/2018 --Continue CIR therapies including PT, OT, and SLP  2. Antithrombotics: -DVT/anticoagulation:SCDs -antiplatelet therapy: N/A 3. Pain Management:Tylenol as needed 4. Mood/mild mental retardation with schizophrenia:Patient on Klonopin 0.5 mg daily prior to admission and Paxil 20 mg daily, Risperdal1 mg twice a day. Resume as needed -antipsychotic agents: N/A  -sleep chart  -ritalin trial with positive effects so far  -limit neurosedating meds as possible 5. Neuropsych: This patienthe has notcapable of making decisions on hisown behalf. 6. Skin/Wound Care:Routine skin checks 7. Fluids/Electrolytes/Nutrition:  -encourage po   -ate quite well yesterday 100%!  -follow up hydration status tomorrow 8. Seizure prophylaxis. Keppra 500 mg twice a day 9. Dysphagia. Dysphagia #1 thin liquids. Adv per speech therapy 10.Tobacco abuse. Counseling     LOS: 2 days A FACE TO FACE EVALUATION WAS PERFORMED  Ranelle Oyster 07/21/2018, 10:17 AM

## 2018-07-22 ENCOUNTER — Inpatient Hospital Stay (HOSPITAL_COMMUNITY): Payer: Medicare Other | Admitting: Occupational Therapy

## 2018-07-22 ENCOUNTER — Inpatient Hospital Stay (HOSPITAL_COMMUNITY): Payer: Medicare Other | Admitting: Physical Therapy

## 2018-07-22 ENCOUNTER — Inpatient Hospital Stay (HOSPITAL_COMMUNITY): Payer: Medicare Other | Admitting: Speech Pathology

## 2018-07-22 LAB — BASIC METABOLIC PANEL
Anion gap: 7 (ref 5–15)
BUN: 20 mg/dL (ref 8–23)
CO2: 23 mmol/L (ref 22–32)
Calcium: 8.1 mg/dL — ABNORMAL LOW (ref 8.9–10.3)
Chloride: 105 mmol/L (ref 98–111)
Creatinine, Ser: 1.1 mg/dL (ref 0.61–1.24)
GFR calc Af Amer: >60 mL/min (ref >60)
GFR calc non Af Amer: >60 mL/min (ref >60)
Glucose, Bld: 128 mg/dL — ABNORMAL HIGH (ref 70–99)
Potassium: 3.7 mmol/L (ref 3.5–5.1)
Sodium: 135 mmol/L (ref 135–145)

## 2018-07-22 MED ORDER — PAROXETINE HCL 20 MG PO TABS
20.0000 mg | ORAL_TABLET | Freq: Every day | ORAL | Status: DC
  Administered 2018-07-22 – 2018-08-06 (×16): 20 mg via ORAL
  Filled 2018-07-22 (×17): qty 1

## 2018-07-22 NOTE — Progress Notes (Signed)
[place] PHYSICAL MEDICINE & REHABILITATION PROGRESS NOTE   Subjective/Complaints: Up in bed. No new complaints although RN reports urinary frequency despite pyridium being given  ROS: Limited due to cognitive/behavioral      Objective:   No results found. Recent Labs    07/20/18 0549  WBC 11.0*  HGB 10.0*  HCT 31.7*  PLT 290   Recent Labs    07/20/18 0549 07/22/18 1016  NA 139 135  K 4.4 3.7  CL 107 105  CO2 27 23  GLUCOSE 89 128*  BUN 27* 20  CREATININE 1.11 1.10  CALCIUM 8.5* 8.1*    Intake/Output Summary (Last 24 hours) at 07/22/2018 1058 Last data filed at 07/22/2018 0805 Gross per 24 hour  Intake 1074 ml  Output 1050 ml  Net 24 ml     Physical Exam: Vital Signs Blood pressure 140/84, pulse (!) 53, temperature 98.1 F (36.7 C), temperature source Oral, resp. rate 16, height 5\' 6"  (1.676 m), weight 76.9 kg, SpO2 99 %.  Constitutional: No distress . Vital signs reviewed. HEENT: EOMI, oral membranes moist Neck: supple Cardiovascular: RRR without murmur. No JVD    Respiratory: CTA Bilaterally without wheezes or rales. Normal effort    GI: BS +, non-tender, non-distended  Musculoskeletal:  General: No edema.  Neurological: Alert, following commands. Improving awareness.  Lateral deviation of left eye. Able to look up and down.  Moves LUE and LLE 4/5, 2+ to 3/5 RUE and RLE Skin: Skin iswarm.  Psychiatric:alert and cooperative   Assessment/Plan: 1. Functional deficits secondary to TBI which require 3+ hours per day of interdisciplinary therapy in a comprehensive inpatient rehab setting.  Physiatrist is providing close team supervision and 24 hour management of active medical problems listed below.  Physiatrist and rehab team continue to assess barriers to discharge/monitor patient progress toward functional and medical goals  Care Tool:  Bathing  Bathing activity did not occur: Safety/medical concerns Body parts bathed by patient: Right  arm, Left arm, Chest, Abdomen, Front perineal area, Buttocks, Right upper leg, Left upper leg   Body parts bathed by helper: Right lower leg, Left lower leg     Bathing assist Assist Level: Minimal Assistance - Patient > 75%     Upper Body Dressing/Undressing Upper body dressing   What is the patient wearing?: (disposable scrubs)    Upper body assist Assist Level: Maximal Assistance - Patient 25 - 49%    Lower Body Dressing/Undressing Lower body dressing      What is the patient wearing?: (disposable scrubs)     Lower body assist Assist for lower body dressing: Maximal Assistance - Patient 25 - 49%     Toileting Toileting    Toileting assist Assist for toileting: Contact Guard/Touching assist     Transfers Chair/bed transfer  Transfers assist     Chair/bed transfer assist level: Contact Guard/Touching assist     Locomotion Ambulation   Ambulation assist      Assist level: Contact Guard/Touching assist Assistive device: Walker-rolling Max distance: 225ft   Walk 10 feet activity   Assist     Assist level: Contact Guard/Touching assist Assistive device: Hand held assist   Walk 50 feet activity   Assist    Assist level: Contact Guard/Touching assist Assistive device: Walker-rolling    Walk 150 feet activity   Assist Walk 150 feet activity did not occur: Refused    Assistive device: Walker-rolling    Walk 10 feet on uneven surface  activity   Assist Walk  10 feet on uneven surfaces activity did not occur: Refused   Assist level: Contact Guard/Touching assist     Wheelchair     Assist   Type of Wheelchair: Manual    Wheelchair assist level: Moderate Assistance - Patient 50 - 74% Max wheelchair distance: 25    Wheelchair 50 feet with 2 turns activity    Assist    Wheelchair 50 feet with 2 turns activity did not occur: Safety/medical concerns       Wheelchair 150 feet activity     Assist Wheelchair 150 feet  activity did not occur: Safety/medical concerns       Medical Problem List and Plan: 1.Decreased functional mobility with right side weaknesssecondary to traumatic left subdural hematoma. Status post craniotomy evacuation 07/07/2018 followed by reaccumulation of SDH with redo craniotomy 07/13/2018 --Continue CIR therapies including PT, OT, and SLP  -demonstrating improved arousal and awareness  2. Antithrombotics: -DVT/anticoagulation:SCDs -antiplatelet therapy: N/A 3. Pain Management:Tylenol as needed 4. Mood/mild mental retardation with schizophrenia:Patient on Klonopin 0.5 mg daily prior to admission and Paxil 20 mg daily, Risperdal1 mg twice a day. Resume as needed -antipsychotic agents: N/A  -sleep chart  -ritalin trial with positive effects so far  -limit neurosedating meds as possible  -resume Paxil today 4/3 5. Neuropsych: This patienthe has notcapable of making decisions on hisown behalf. 6. Skin/Wound Care:Routine skin checks 7. Fluids/Electrolytes/Nutrition:  -encourage po   -eating 100%!  -BUN/Cr improved to 20/1.1 today 4/3---recheck next week 8. Seizure prophylaxis. Keppra 500 mg twice a day 9. Dysphagia. Dysphagia #1 thin liquids. Adv per speech therapy 10.Tobacco abuse. Counseling     LOS: 3 days A FACE TO FACE EVALUATION WAS PERFORMED  Ranelle Oyster 07/22/2018, 10:58 AM

## 2018-07-22 NOTE — Progress Notes (Signed)
Speech Language Pathology Daily Session Note  Patient Details  Name: Michael Mercado MRN: [id number] Date of Birth: 02/15/1941  Today's Date: 07/22/2018 SLP Individual Time: 1100-1145 SLP Individual Time Calculation (min): 45 min  Short Term Goals: Week 1: SLP Short Term Goal 1 (Week 1): Pt will consume thin liquids with minimal overt s/s of aspiration with supervision cues for use of compensatory swallow strategies.  SLP Short Term Goal 2 (Week 1): Pt will consume trials of dysphagia 2 with minimal overt s/s of aspiration/dysphagia dn supervision cues.   Skilled Therapeutic Interventions:  Pt was seen for skilled ST targeting dysphagia goals.  Pt was received sitting upright in the recliner, awake, alert, and pleasantly interactive.  Pt indicated that at baseline he would eat pork chops, green beans, eggs, bacon, sausage and most consistencies.  SLP facilitated the session with a trial snack of dys 2 and 3 textures.  While pt has an atypical oral phase, SLP suspects swallowing function is nearing baseline as it was completely functional for masticating (even with near absent dentition) and clearing boluses from the oral cavity.  No residue was evident post swallow and pt had no overt s/s of aspiration with solids or liquids despite being allowed to self feed at what is presumed to by his typical, fast rate of intake (per report from primary therapist who spoke with pt's caregiver).  Discussed presentation today with pt's primary SLP and we both are in agreement with slowly advancing pt's diet to dys 2 textures with ongoing full supervision for use of swallowing precautions.  Prognosis for further advancement appears good if pt is able to tolerate upgraded solids over the next couple of days.   Pt was left in recliner with chair alarm set and call bell within reach.  Continue per current plan of care.    Pain Pain Assessment Pain Scale: 0-10 Pain Score: 0-No pain  Therapy/Group: Individual  Therapy  Braxtin Bamba, Melanee Spry 07/22/2018, 11:44 AM

## 2018-07-22 NOTE — Patient Care Conference (Signed)
Inpatient RehabilitationTeam Conference and Plan of Care Update Date: 07/20/2018   Time: 10:45 AM    Patient Name: Michael Mercado      Medical Record Number: [id number]  Date of Birth: [birth year] Sex: Male         Room/Bed: 4W21C/4W21C-01 Payor Info: Payor: MEDICARE / Plan: MEDICARE PART A AND B / Product Type: *No Product type* /    Admitting Diagnosis: L SDH after a fall  crani  re do  Admit Date/Time:  07/19/2018  2:47 PM Admission Comments: No comment available   Primary Diagnosis:  <principal problem not specified> Principal Problem: <principal problem not specified>  Patient Active Problem List   Diagnosis Date Noted  . Traumatic subdural hematoma (HCC) 07/19/2018  . Subdural hematoma (HCC) 07/07/2018  . Endotracheally intubated 07/07/2018  . Encounter for postanesthesia care 07/07/2018  . CKD (chronic kidney disease), stage II 03/18/2016  . HTN (hypertension), benign 03/18/2015  . COPD (chronic obstructive pulmonary disease) (HCC) 03/18/2015  . Intellectual disability 03/18/2015  . Schizophrenia (HCC) 03/18/2015  . Hyperlipidemia LDL goal <130 03/18/2015    Expected Discharge Date: Expected Discharge Date: 07/27/18  Team Members Present: Physician leading conference: Dr. Maryla Morrow Social Worker Present: Staci Acosta, LCSW Nurse Present: Doran Durand, LPN PT Present: Grier Rocher, PT;Rosita Dechalus, PTA OT Present: Jackquline Denmark, OT SLP Present: Reuel Derby, SLP PPS Coordinator present : Edson Snowball, PT     Current Status/Progress Goal Weekly Team Focus  Medical   Admitted with left subdural hemorrhage requiring craniotomy.  Patient with prolonged postoperative course.  History of mental retardation and schizophrenia at baseline.  Significant dysphasia and hyperarousal  Improve attention and initiation  Nutrition/swallowing, normalization of sleep-wake cycle, blood pressure control and behavioral control   Bowel/Bladder   Continent of bowel &  bladder w/ periods of night time bladder incontinence, LBM 07/19/18  less episodes of incontinence  timed toileting   Swallow/Nutrition/ Hydration   Min A cues with consuming dysphagia 1 with thin liquids   Supervision  no overt s/s when rapidly consuming thin liquids via cup   ADL's   min - mod A for self care tasks with min hand held assistance for functional transfers. Pt very lethargic  supervision overall  functional mobility/transfers, self care, balance, R inattention, activity tolerance   Mobility             Communication   baseline         Safety/Cognition/ Behavioral Observations  baseline         Pain   no c/o pain, has tylenol prn  pain scale <2/10  assess & treat as needed   Skin   incision to scalp with staples clean, dry & intact, various areas of bruising to arms, chest & thighs, has protective drsg to sacrum  no new areas of skin break down  assess q shift    Rehab Goals Patient on target to meet rehab goals: Yes Rehab Goals Revised: none *See Care Plan and progress notes for long and short-term goals.     Barriers to Discharge  Current Status/Progress Possible Resolutions Date Resolved   Physician    Medical stability;Behavior        See details in medical problem list on progress note      Nursing                  PT  Decreased caregiver support;Home environment access/layout;Lack of/limited family support;Medication compliance  OT Other (comments)  none known at this time             SLP (baseline deficits)              SW                Discharge Planning/Teaching Needs:  Pt hopes to return to group home where he has lived for several years, but they may not be able to meet his level of care needed.  to be determined once d/c disposition is decided   Team Discussion:  Pt was lethargic during the day when he first arrived, so MD started Ritalin.  MD is also watching creatine and BUN as these are trending up. Pt is incontinent of both  bowel and bladder.  Once OT got pt up, he became less lethargic and was min A to the bathroom and got dressed and worked with OT.  He has right inattention.  Pt's tracking seems fine.  ST is working on pt's swallowing, but he will probably remain on puree diet at d/c.  Pt is functional with 1 step directions.  Revisions to Treatment Plan:  none    Continued Need for Acute Rehabilitation Level of Care: The patient requires daily medical management by a physician with specialized training in physical medicine and rehabilitation for the following conditions: Daily direction of a multidisciplinary physical rehabilitation program to ensure safe treatment while eliciting the highest outcome that is of practical value to the patient.: Yes Daily medical management of patient stability for increased activity during participation in an intensive rehabilitation regime.: Yes Daily analysis of laboratory values and/or radiology reports with any subsequent need for medication adjustment of medical intervention for : Post surgical problems;Neurological problems;Mood/behavior problems;Blood pressure problems   I attest that I was present, lead the team conference, and concur with the assessment and plan of the team.Team conference was held via web/ teleconference due to COVID - 19.   Izabell Schalk, [place] 07/22/2018, 2:01 PM

## 2018-07-22 NOTE — Progress Notes (Addendum)
Social Work Patient ID: Michael Mercado, male   DOB: 05-Sep-1940, 78 y.o.   MRN: [id number]   CSW met briefly with pt 07-21-18 and spoke to his QP, [name] Caple, regarding targeted d/c date and team conference discussion.  She stated that group home team would need to assess pt prior to his d/c to make sure they can meet his needs, as he did not require 24/7 S/CGA PTA.  Team plans to observe a PT session on Monday via video conference to see how pt is moving about.  CSW will also give information, as needed.  CSW to also talk with pt's niece and care coordinator.  CSW will continue to follow pt and assist as needed with d/c disposition.

## 2018-07-22 NOTE — Progress Notes (Signed)
During the shift, patient is exhibiting urinary frequency.He had multiple instances where he only voided less than or equal to154ml & he is calling for the urinal every 30 minutes to a hour. His bladder was scanned & there was less than 71ml left in his bladder, but he again called for the urinal less than 45 minutes after the last attempt. He was started on pyridium as per order with the first dose at approximately 2130. He is still having symptoms at this time. Will continue to monitor for effectiveness & assist as needed.

## 2018-07-22 NOTE — IPOC Note (Signed)
Overall Plan of Care [facility]) Patient Details Name: Michael Mercado MRN: [id number] DOB: 02-01-1941  Admitting Diagnosis: <principal problem not specified>  Hospital Problems: Active Problems:   Traumatic subdural hematoma (HCC)     Functional Problem List: Nursing Bladder, Bowel, Endurance, Medication Management, Safety  PT Balance, Behavior, Endurance, Motor, Perception, Safety, Sensory, Skin Integrity  OT Balance, Endurance, Cognition, Motor, Pain, Safety  SLP    TR         Basic ADL's: OT Grooming, Bathing, Dressing, Toileting     Advanced  ADL's: OT       Transfers: PT Bed Mobility, Bed to Chair, Car, Furniture, Civil Service fast streamer, Research scientist (life sciences): PT Ambulation, Psychologist, prison and probation services, Stairs     Additional Impairments: OT Fuctional Use of Upper Extremity  SLP Swallowing      TR      Anticipated Outcomes Item Anticipated Outcome  Self Feeding S  Swallowing  Supervision with least restrictive diet   Basic self-care  S  Toileting  S   Bathroom Transfers S  Bowel/Bladder  Manage bowel w/Mod I, and bladder w/ min assist  Transfers  Supervision assist with LRAD   Locomotion  CGA with LRAD at house hold level   Communication     Cognition     Pain     Safety/Judgment  Maintain safety w/ min assist   Therapy Plan: PT Intensity: Minimum of 1-2 x/day ,45 to 90 minutes PT Frequency: 5 out of 7 days PT Duration Estimated Length of Stay: 7-10 days  OT Intensity: Minimum of 1-2 x/day, 45 to 90 minutes OT Frequency: 5 out of 7 days OT Duration/Estimated Length of Stay: 7-10 days SLP Intensity: Minumum of 1-2 x/day, 30 to 90 minutes SLP Frequency: 3 to 5 out of 7 days SLP Duration/Estimated Length of Stay: 4/8    Team Interventions: Nursing Interventions Bowel Management, Bladder Management, Patient/Family Education, Discharge Planning, Medication Management, Disease Management/Prevention  PT interventions Cognitive  remediation/compensation, Ambulation/gait training, Balance/vestibular training, Discharge planning, Disease management/prevention, Community reintegration, Functional electrical stimulation, DME/adaptive equipment instruction, Functional mobility training, Neuromuscular re-education, Pain management, Patient/family education, Psychosocial support, Skin care/wound management, Splinting/orthotics, Stair training, Therapeutic Exercise, Therapeutic Activities, UE/LE Strength taining/ROM, UE/LE Coordination activities, Visual/perceptual remediation/compensation, Wheelchair propulsion/positioning  OT Interventions Warden/ranger, Neuromuscular re-education, Self Care/advanced ADL retraining, Therapeutic Exercise, Wheelchair propulsion/positioning, Cognitive remediation/compensation, DME/adaptive equipment instruction, UE/LE Strength taining/ROM, Firefighter, Equities trader education, UE/LE Coordination activities, Discharge planning, Functional mobility training, Psychosocial support, Therapeutic Activities  SLP Interventions Dysphagia/aspiration precaution training  TR Interventions    SW/CM Interventions Discharge Planning, Psychosocial Support, Patient/Family Education   Barriers to Discharge MD  Medical stability  Nursing      PT Decreased caregiver support, Home environment access/layout, Lack of/limited family support, Medication compliance    OT Other (comments) none known at this time  SLP (baseline deficits)    SW       Team Discharge Planning: Destination: PT-Home ,OT- Home , SLP-(group home) Projected Follow-up: PT-Home health PT, OT-  Home health OT, 24 hour supervision/assistance, SLP-Home Health SLP Projected Equipment Needs: PT- , OT- To be determined, SLP-None recommended by SLP Equipment Details: PT- , OT-  Patient/family involved in discharge planning: PT- Patient,  OT-Patient, SLP-Patient unable/family or caregive not available  MD ELOS: 7-10  days Medical Rehab Prognosis:  Excellent Assessment: The patient has been admitted for CIR therapies with the diagnosis of traumatic SDH. The team will be addressing functional mobility, strength, stamina, balance, safety, adaptive techniques  and equipment, self-care, bowel and bladder mgt, patient and caregiver education, NMR, visual-spatial awareness, cognition and behavior, self-care and mobility. Goals have been set at supervision for self-care, mobility and cognitive areas, CGA for gait with walker.    Ranelle Oyster, MD, FAAPMR      See Team Conference Notes for weekly updates to the plan of care

## 2018-07-22 NOTE — Progress Notes (Signed)
Physical Therapy Session Note  Patient Details  Name: Michael Mercado MRN: [id number] Date of Birth: [birth year]  Today's Date: 07/22/2018 PT Individual Time: 1300-1400 PT Individual Time Calculation (min): 60 min   Short Term Goals: Week 1:  PT Short Term Goal 1 (Week 1): STG=LTG due to ELOS  Skilled Therapeutic Interventions/Progress Updates:   Pt received sitting in WC and agreeable to PT. Pt reports need for urination. ambualtory transfer to toilet with CGA assist from PT and no AD, mild furniture walking along sink. Pt able to perform toilet transfer with supervision assist as well as clothing managmenet.   Pt transported to rehab gym. Gait training with RW x 141f and CGA for clothing managmenet. Additional gait training without AD and CGA from PT for safety and clothing management. Increased trunk deviation without AD compared to With RW. PT also instructed pt in gait training with Rollator with supervision assist for safety. Pt noted to have improved AD management compared to RW and improved step length and trunk control compared to no AD.   Standing balance training to toss ball off trampoline 2 x 10 with CGA from PT for safety. PT required to catch ball upon return due to slow reaction time from pt. No LOB noted from Pt.  Pt then returned to room while gait training with Rollator x 2069fwith supervision assist. Moderate cues for AD management once in room and perform safe transfer to bed. Sit>supine with supervision assist for safety. Pt left supine in bed with call bell in reach and all needs met.        Therapy Documentation Precautions:  Precautions Precautions: Fall Precaution Comments: Rt inattention Restrictions Weight Bearing Restrictions: No Vital Signs: Therapy Vitals Pulse Rate: (!) 54 Resp: 16 BP: 119/64 Patient Position (if appropriate): Lying Oxygen Therapy SpO2: 100 % O2 Device: Room Air Pain: Pain Assessment Pain Scale: 0-10 Pain Score: 0-No  pain    Therapy/Group: Individual Therapy  AuLorie Phenix/06/2018, 3:15 PM

## 2018-07-22 NOTE — Progress Notes (Addendum)
Occupational Therapy Session Note  Patient Details  Name: Michael Mercado MRN: [id number] Date of Birth: 01-10-1941  Today's Date: 07/22/2018 OT Individual Time: 0355-9741 and 6384-5364 OT Individual Time Calculation (min): 69 min and 29 min  Short Term Goals: Week 1:  OT Short Term Goal 1 (Week 1): STGs=LTGs secondary to estimated short LOS  Skilled Therapeutic Interventions/Progress Updates:    Pt greeted in bed, reporting need to use the restroom. Ambulatory transfer to toilet completed without AD, and Min A. Pt with continent bladder void once he lowered clothing. Brief was saturated in urine. When asked if he wanted a shower, pt stated "might as well." He completed bathing (sit<stand in shower), dressing (sit<stand from standard toilet), and oral care/handwashing (standing at sink) during session. Tx focus placed on Rt NMR, functional ambulation, dynamic standing balance, and cognition. All transfers completed with Min A HHA. Attempted to use walker however pt was very unsafe, often lifting it up and carrying it. While in shower, OT held [facility] shower hose to maintain dryness of staple sites. Pt required min vcs for sequencing and mod vcs for thoroughness when washing. Able to scan to locate soap and open soap bottle. Steady assist sit<stand for perihygiene completion. During dressing, he required vcs for clothing orientation and utilizing figure 4 bilaterally to don gripper socks. Pt needed assist to don socks completely, as there would be fabric beyond toes and pt unable to correct it with cuing. Steady assist sit<stand with RW for pt able to elevate pants over hips. Able to scan for needed items during handwashing and oral care, integrating R UE with min vcs. When he was EOB, OT donned Teds and pt donned gripper socks again with steady assist for balance. Cues provided for taking small sips while he drank his coffee after. Pt opted to return to bed due to fatigue. Pt left in bed with all needs  within reach and bed alarm set.    2nd Session 1:1 tx (29 min) Pt greeted in bed, requesting to use the bathroom. Supervision supine<sit from flat bed without bedrail. Ambulatory transfer using rollator completed with Min A and max vcs for DME mgt. He also needed help to lock the brakes. Mod A for toileting tasks due to BM and needing assist with thorough hygiene. Able to elevate LB garments over hips with cuing and Min A for balance. Then he washed his hands while standing at sink. For remainder of session, worked on increasing comfort and safety with walker use. He ambulated in hallway for approx 435 ft before needing a seated rest break. Max vcs for using rollator seat safely to sit. Tried to have pt read room placards to locate his room, but due to cognition and/or vision, he was unable to without max cuing. Pt then transferred back to bed and transitioned to supine with supervision. Pt was left with all needs within reach and bed alarm set.   Therapy Documentation Precautions:  Precautions Precautions: Fall Precaution Comments: Rt inattention Restrictions Weight Bearing Restrictions: No Pain: Pt reported pain B flank areas, and head. RN in during session to provide pain medicine. No c/o pain during 2nd session  Pain Assessment Pain Scale: 0-10 Pain Score: 0-No pain ADL:       Therapy/Group: Individual Therapy  Michael Mercado A Michael Mercado 07/22/2018, 12:13 PM

## 2018-07-23 ENCOUNTER — Inpatient Hospital Stay (HOSPITAL_COMMUNITY): Payer: Medicare Other

## 2018-07-23 ENCOUNTER — Inpatient Hospital Stay (HOSPITAL_COMMUNITY): Payer: Medicare Other | Admitting: Physical Therapy

## 2018-07-23 ENCOUNTER — Inpatient Hospital Stay (HOSPITAL_COMMUNITY): Payer: Medicare Other | Admitting: Occupational Therapy

## 2018-07-23 DIAGNOSIS — D62 Acute posthemorrhagic anemia: Secondary | ICD-10-CM

## 2018-07-23 DIAGNOSIS — D72829 Elevated white blood cell count, unspecified: Secondary | ICD-10-CM

## 2018-07-23 DIAGNOSIS — R0989 Other specified symptoms and signs involving the circulatory and respiratory systems: Secondary | ICD-10-CM

## 2018-07-23 DIAGNOSIS — R350 Frequency of micturition: Secondary | ICD-10-CM

## 2018-07-23 DIAGNOSIS — R739 Hyperglycemia, unspecified: Secondary | ICD-10-CM

## 2018-07-23 DIAGNOSIS — R1310 Dysphagia, unspecified: Secondary | ICD-10-CM

## 2018-07-23 MED ORDER — PANTOPRAZOLE SODIUM 40 MG PO TBEC
40.0000 mg | DELAYED_RELEASE_TABLET | Freq: Every day | ORAL | Status: DC
  Administered 2018-07-23 – 2018-08-05 (×15): 40 mg via ORAL
  Filled 2018-07-23 (×14): qty 1

## 2018-07-23 NOTE — Progress Notes (Signed)
Doesn't call for assistance. Telesitter and alarms alert staff. Continues with urinary frequency. Without complaint of pain. Min. Verbalization. Dysarthric speech.Michael Mercado A

## 2018-07-23 NOTE — Progress Notes (Signed)
Physical Therapy Session Note  Patient Details  Name: Michael Mercado MRN: [id number] Date of Birth: Jun 21, 1940  Today's Date: 07/23/2018 PT Individual Time: 3491-7915 PT Individual Time Calculation (min): 60 min   Short Term Goals: Week 1:  PT Short Term Goal 1 (Week 1): STG=LTG due to ELOS  Skilled Therapeutic Interventions/Progress Updates:   Pt received sitting on toilet and agreeable to PT. Pt able to perform pericare and clothing management with supervision assist. Ambulatory transfer to [facility] with Rollator and supervision assist.  Gait training with rollator x173f+ 1669fwith supervision assist and min cues for safety in turns and proper use of rollator to assist in transfer to sitting. Pt consistently stepping out of rollator for transfers.  Dynamic gait training with rollator to weave through 8 cones x 2 with supervision assist and min cues for gait pattern as improved visual scanning due to R inattention.   Dynamic balance trainnig while standing on airex pad and performing fine motor cognitive tasks. Pipe tree to build field goal with mod-max assist for problem solving, checkers with max assist for attention to task and improved awareness and retention of rules of play. Poor carryover of instructions. Min-mod assist to maintain balance and prevent anterior LOB; constant UE support on rolling table, regardless of cues for decreased UE support.    Toilet transfer at end of session for additional urination with supervision assist from PT. Pt able to perform clothing management without addition assist.   Pt returned to room and performed ambulatory transfer to bed rollator and supervision assist. Sit>supine completed with distant supervision assist, and pt left supine in bed with call bell in reach and all needs met.       Therapy Documentation Precautions:  Precautions Precautions: Fall Precaution Comments: Rt inattention Restrictions Weight Bearing Restrictions:  No General: PT Amount of Missed Time (min): 15 Minutes PT Missed Treatment Reason: Patient fatigue Pain: Pain Assessment Pain Scale: 0-10 Pain Score: 0-No pain    Therapy/Group: Individual Therapy  AuLorie Phenix/07/2018, 12:10 PM

## 2018-07-23 NOTE — Progress Notes (Signed)
[place] PHYSICAL MEDICINE & REHABILITATION PROGRESS NOTE   Subjective/Complaints: Patient seen laying in bed this morning.  He did not sleep well overnight per sleep chart due to frequent urination.  Patient is aphasic.  No reported issues.  ROS: Limited due to cognition   Objective:   No results found. No results for input(s): WBC, HGB, HCT, PLT in the last 72 hours. Recent Labs    07/22/18 1016  NA 135  K 3.7  CL 105  CO2 23  GLUCOSE 128*  BUN 20  CREATININE 1.10  CALCIUM 8.1*    Intake/Output Summary (Last 24 hours) at 07/23/2018 1036 Last data filed at 07/23/2018 0820 Gross per 24 hour  Intake 798 ml  Output 1200 ml  Net -402 ml     Physical Exam: Vital Signs Blood pressure (!) 156/79, pulse 92, temperature 98.4 F (36.9 C), resp. rate 16, height 5\' 6"  (1.676 m), weight 76.9 kg, SpO2 98 %. Constitutional: NAD.  Vital signs reviewed. HENT: Crani site C/C/I. Eyes: No discharge. Cardiovascular: No JVD    Respiratory: Normal effort    GI: Nondistended. Musculoskeletal: No edema or tenderness in lower extremities Neurological: Alert Dysarthria Motor: Limited due to ability follow commands, however moving all extremities Skin: Skin iswarm.  See above Psychiatric:alert and cooperative   Assessment/Plan: 1. Functional deficits secondary to TBI which require 3+ hours per day of interdisciplinary therapy in a comprehensive inpatient rehab setting.  Physiatrist is providing close team supervision and 24 hour management of active medical problems listed below.  Physiatrist and rehab team continue to assess barriers to discharge/monitor patient progress toward functional and medical goals  Care Tool:  Bathing  Bathing activity did not occur: Safety/medical concerns Body parts bathed by patient: Right arm, Left arm, Chest, Abdomen, Front perineal area, Buttocks, Right upper leg, Left upper leg   Body parts bathed by helper: Right lower leg, Left lower leg      Bathing assist Assist Level: Minimal Assistance - Patient > 75%     Upper Body Dressing/Undressing Upper body dressing   What is the patient wearing?: Pull over shirt    Upper body assist Assist Level: Supervision/Verbal cueing    Lower Body Dressing/Undressing Lower body dressing      What is the patient wearing?: Pants, Incontinence brief     Lower body assist Assist for lower body dressing: Minimal Assistance - Patient > 75%     Toileting Toileting    Toileting assist Assist for toileting: Moderate Assistance - Patient 50 - 74%     Transfers Chair/bed transfer  Transfers assist     Chair/bed transfer assist level: Contact Guard/Touching assist     Locomotion Ambulation   Ambulation assist      Assist level: Supervision/Verbal cueing Assistive device: Walker-rolling(rollator) Max distance: 232ft   Walk 10 feet activity   Assist     Assist level: Supervision/Verbal cueing Assistive device: Hand held assist   Walk 50 feet activity   Assist    Assist level: Supervision/Verbal cueing Assistive device: Walker-rolling    Walk 150 feet activity   Assist Walk 150 feet activity did not occur: Refused  Assist level: Supervision/Verbal cueing Assistive device: Walker-rolling    Walk 10 feet on uneven surface  activity   Assist Walk 10 feet on uneven surfaces activity did not occur: Refused   Assist level: Contact Guard/Touching assist     Wheelchair     Assist   Type of Wheelchair: Manual    Wheelchair assist  level: Moderate Assistance - Patient 50 - 74% Max wheelchair distance: 25    Wheelchair 50 feet with 2 turns activity    Assist    Wheelchair 50 feet with 2 turns activity did not occur: Safety/medical concerns       Wheelchair 150 feet activity     Assist Wheelchair 150 feet activity did not occur: Safety/medical concerns       Medical Problem List and Plan: 1.Decreased functional mobility with  right side weaknesssecondary to traumatic left subdural hematoma. Status post craniotomy evacuation 07/07/2018 followed by reaccumulation of SDH with redo craniotomy 07/13/2018  Continue CIR   Notes reviewed- fall after getting out of the shower resulting in SDH requiring redo craniotomy, images reviewed- most recent CT with proving SDH and left to right midline shift, labs reviewed 2. Antithrombotics: -DVT/anticoagulation:SCDs -antiplatelet therapy: N/A 3. Pain Management:Tylenol as needed 4. Mood/mild mental retardation with schizophrenia:Patient on Klonopin 0.5 mg daily prior to admission and Paxil 20 mg daily, Risperdal1 mg twice a day. Resume as needed -antipsychotic agents: N/A  -sleep chart  -ritalin trial   -limit neurosedating meds as possible  -resumed Paxil on 4/3 5. Neuropsych: This patienthe has notcapable of making decisions on hisown behalf.  Continue tele-sitter for safety 6. Skin/Wound Care:Routine skin checks 7. Fluids/Electrolytes/Nutrition:  -encourage po  BMP within acceptable range on 4/3, except for glucose 8. Seizure prophylaxis. Keppra 500 mg twice a day 9. Dysphagia. Dysphagia #1 thin liquids.  Advanced to D2 thins, continue to advance as tolerated 10.Tobacco abuse. Counseling 11.  Labile blood pressure  Labile on 4/4  Monitor for trend 12.  Hyperglycemia  ?  Trending up on 4/3  Monitor with increased mobility 13.  Leukocytosis  WBCs 11.0 on 4/1  Afebrile  Labs ordered for Monday 14.  Acute blood loss anemia  Hemoglobin 10.0 on 4/29  Labs ordered for Monday 15.  Urinary frequency  PVRs ordered   LOS: 4 days A FACE TO FACE EVALUATION WAS PERFORMED  Michael Mercado Michael Mercado 07/23/2018, 10:36 AM

## 2018-07-24 ENCOUNTER — Inpatient Hospital Stay (HOSPITAL_COMMUNITY): Payer: Medicare Other | Admitting: Physical Therapy

## 2018-07-24 ENCOUNTER — Inpatient Hospital Stay (HOSPITAL_COMMUNITY): Payer: Medicare Other

## 2018-07-24 LAB — URINALYSIS, COMPLETE (UACMP) WITH MICROSCOPIC
Bilirubin Urine: NEGATIVE
Glucose, UA: NEGATIVE mg/dL
Hgb urine dipstick: NEGATIVE
Ketones, ur: NEGATIVE mg/dL
Leukocytes,Ua: NEGATIVE
Nitrite: NEGATIVE
Protein, ur: NEGATIVE mg/dL
Specific Gravity, Urine: 1.013 (ref 1.005–1.030)
pH: 6 (ref 5.0–8.0)

## 2018-07-24 NOTE — Progress Notes (Signed)
UA C&S collected. Yellow clear urine. No odor noted. Sent to lab.  Ross Ludwig, LPN

## 2018-07-24 NOTE — Progress Notes (Signed)
Physical Therapy Session Note  Patient Details  Name: JERMARCUS GRAUBERGER MRN: [id number] Date of Birth: [birth year]  Today's Date: 07/24/2018 PT Individual Time: 5859-2924 PT Individual Time Calculation (min): 55 min   Short Term Goals: Week 1:  PT Short Term Goal 1 (Week 1): STG=LTG due to ELOS  Skilled Therapeutic Interventions/Progress Updates:  Pt was seen bedside in the pm, pt sleeping but arouses to stimuli. Pt willing to participate with encouragement. Pt transferred to edge of bed with S. Pt performed sit to stand transfers with S and stand pivot transfers with S. Pt ambulated in and out of the bathroom with S to c/g and 4WW. Pt performed toilet transfers with S. Pt ambulated with 4WW, 150 feet x 2 with S and verbal cues. Pt ambulated without assistive device and S to to c/g with verbal cues. Pt returned to room following treatment. Pt transferred edge of bed to supine with S. Pt left sitting up in bed with bed alarm on and call bell within reach.   Therapy Documentation Precautions:  Precautions Precautions: Fall Precaution Comments: Rt inattention Restrictions Weight Bearing Restrictions: No General:   Pain: No c/o pain.   Therapy/Group: Individual Therapy  Rayford Halsted 07/24/2018, 3:26 PM

## 2018-07-24 NOTE — Progress Notes (Signed)
Physical Therapy Session Note  Patient Details  Name: Michael Mercado MRN: [id number] Date of Birth: December 12, 1940  Today's Date: 07/24/2018 PT Individual Time: 1100-1155 PT Individual Time Calculation (min): 55 min   Short Term Goals: Week 1:  PT Short Term Goal 1 (Week 1): STG=LTG due to ELOS  Skilled Therapeutic Interventions/Progress Updates:  Pt was seen bedside in the am following OT treatment. Pt transferred supine to edge of bed with S and increased time. Pt transferred sit to stand with S and rollator. Pt ambulated about 150 feet with rollator and S to rehab gym. In gym, pt ambulated 50 feet x 3 through slalom of course of cones with S to c/g and verbal cues. Pt ambulated 150 feet with rollator and S to day room. Pt rode Nu step 6 minutes at level 3 with verbal cues to remain focused on task. Pt ambulated back to room about 200 feet with rollator and S. Pt transferred edge of bed to supine to S. Pt left sitting up in bed with call bell within reach and bed alarm on. Pt requires frequent verbal cues to remain focused on task.    Therapy Documentation Precautions:  Precautions Precautions: Fall Precaution Comments: Rt inattention Restrictions Weight Bearing Restrictions: No General:   Pain: Pain Assessment Pain Scale: Faces Faces Pain Scale: No hurt  Therapy/Group: Individual Therapy  Rayford Halsted 07/24/2018, 11:52 AM

## 2018-07-24 NOTE — Progress Notes (Signed)
Occupational Therapy Session Note  Patient Details  Name: Michael Mercado MRN: [id number] Date of Birth: April 14, 1941  Today's Date: 07/24/2018 OT Individual Time: 7824-2353 OT Individual Time Calculation (min): 69 min    Short Term Goals: Week 1:  OT Short Term Goal 1 (Week 1): STGs=LTGs secondary to estimated short LOS  Skilled Therapeutic Interventions/Progress Updates:    Session focused on morning ADL routine and R UE NMR and attention. Pt received supine in bed, easily awoken with no c/o pain. Pt completed bed mobility to transition to sitting EOB with moderate cueing for initiation. Pt completed functional mobility into bathroom with CGA. Pt voided urine on toilet and completed peri hygiene with CGA. Pt transferred into shower and sat on TTB. Pt required min-moderate cueing throughout shower for initiation of bathing each body part, but was able to do so without any physical assist. CGA provided for balance support when in standing completing peri hygiene. Pt transferred back out of shower and was provided min A to dry posteriorly. Pt donned socks and brief in sitting with CGA. Pt then completed oral hygiene at sink with CGA for balance support. Pt required cueing to use rollator during functional mobility to therapy gym. In gym, pt sat EOM and completed functional reaching activity with a focus on R attention and R UE isolated use. Pt required moderate cueing throughout for Rt attention and R UE use only (no L UE helping). Pt returned to room and completed toileting once more and then returned supine. Bed alarm was set and all needs met.   Therapy Documentation Precautions:  Precautions Precautions: Fall Precaution Comments: Rt inattention Restrictions Weight Bearing Restrictions: No     Therapy/Group: Individual Therapy  Curtis Sites 07/24/2018, 7:21 AM

## 2018-07-24 NOTE — Progress Notes (Signed)
[place] PHYSICAL MEDICINE & REHABILITATION PROGRESS NOTE   Subjective/Complaints: Patient seen laying in bed this morning.  He did not sleep well overnight per sleep chart due to urination.  This morning he also states he has to urinate.  Inconsistently follows commands, but moves all 4 extremities.  ROS: Limited due to cognition   Objective:   No results found. No results for input(s): WBC, HGB, HCT, PLT in the last 72 hours. Recent Labs    07/22/18 1016  NA 135  K 3.7  CL 105  CO2 23  GLUCOSE 128*  BUN 20  CREATININE 1.10  CALCIUM 8.1*    Intake/Output Summary (Last 24 hours) at 07/24/2018 0835 Last data filed at 07/24/2018 0800 Gross per 24 hour  Intake 480 ml  Output 825 ml  Net -345 ml     Physical Exam: Vital Signs Blood pressure (!) 168/68, pulse (!) 49, temperature 98.1 F (36.7 C), resp. rate 16, height 5\' 6"  (1.676 m), weight 76.9 kg, SpO2 99 %. Constitutional: NAD.  Vital signs reviewed. HENT: Crani site C/C/I. Eyes: No discharge. Cardiovascular: No JVD    Respiratory: Normal effort    GI: Nondistended. Musculoskeletal: No edema or tenderness in extremities Neurological:  Alert Dysarthria Motor: Limited due to ability follow commands, however moving all extremities, unchanged  Skin: Skin iswarm.  See above Psychiatric:Altered mentation  Assessment/Plan: 1. Functional deficits secondary to TBI which require 3+ hours per day of interdisciplinary therapy in a comprehensive inpatient rehab setting.  Physiatrist is providing close team supervision and 24 hour management of active medical problems listed below.  Physiatrist and rehab team continue to assess barriers to discharge/monitor patient progress toward functional and medical goals  Care Tool:  Bathing  Bathing activity did not occur: Safety/medical concerns Body parts bathed by patient: Right arm, Left arm, Chest, Abdomen, Front perineal area, Buttocks, Right upper leg, Left upper leg   Body parts bathed by helper: Right lower leg, Left lower leg     Bathing assist Assist Level: Minimal Assistance - Patient > 75%     Upper Body Dressing/Undressing Upper body dressing   What is the patient wearing?: Pull over shirt    Upper body assist Assist Level: Supervision/Verbal cueing    Lower Body Dressing/Undressing Lower body dressing      What is the patient wearing?: Pants, Incontinence brief     Lower body assist Assist for lower body dressing: Minimal Assistance - Patient > 75%     Toileting Toileting    Toileting assist Assist for toileting: Moderate Assistance - Patient 50 - 74%     Transfers Chair/bed transfer  Transfers assist     Chair/bed transfer assist level: Supervision/Verbal cueing     Locomotion Ambulation   Ambulation assist      Assist level: Supervision/Verbal cueing Assistive device: Rollator Max distance: 200   Walk 10 feet activity   Assist     Assist level: Supervision/Verbal cueing Assistive device: Rollator   Walk 50 feet activity   Assist    Assist level: Supervision/Verbal cueing Assistive device: Rollator    Walk 150 feet activity   Assist Walk 150 feet activity did not occur: Refused  Assist level: Supervision/Verbal cueing Assistive device: Rollator    Walk 10 feet on uneven surface  activity   Assist Walk 10 feet on uneven surfaces activity did not occur: Refused   Assist level: Contact Guard/Touching assist     Wheelchair     Assist   Type of  Wheelchair: Chief of Staff assist level: Minimal Assistance - Patient > 75% Max wheelchair distance: 75    Wheelchair 50 feet with 2 turns activity    Assist    Wheelchair 50 feet with 2 turns activity did not occur: Safety/medical concerns   Assist Level: Minimal Assistance - Patient > 75%   Wheelchair 150 feet activity     Assist Wheelchair 150 feet activity did not occur: Safety/medical concerns       Medical  Problem List and Plan: 1.Decreased functional mobility with right side weaknesssecondary to traumatic left subdural hematoma. Status post craniotomy evacuation 07/07/2018 followed by reaccumulation of SDH with redo craniotomy 07/13/2018  Continue CIR  2. Antithrombotics: -DVT/anticoagulation:SCDs -antiplatelet therapy: N/A 3. Pain Management:Tylenol as needed 4. Mood/mild mental retardation with schizophrenia:Patient on Klonopin 0.5 mg daily prior to admission and Paxil 20 mg daily, Risperdal1 mg twice a day. Resume as needed -antipsychotic agents: N/A  -sleep chart  -ritalin trial   -limit neurosedating meds as possible  -resumed Paxil on 4/3 5. Neuropsych: This patienthe has notcapable of making decisions on hisown behalf.  Continue tele-sitter for safety 6. Skin/Wound Care:Routine skin checks 7. Fluids/Electrolytes/Nutrition:  -encourage po  BMP within acceptable range on 4/3, except for glucose 8. Seizure prophylaxis. Keppra 500 mg twice a day 9. Dysphagia. Dysphagia #1 thin liquids.  Advanced to D2 thins, continue to advance as tolerated 10.Tobacco abuse. Counseling 11.  Labile blood pressure  Relatively controlled previously, however?  Trending up on 4/5.  Continue to monitor and consider medications if necessary  Monitor for trend 12.  Hyperglycemia  ?  Trending up on 4/3  Monitor with increased mobility 13.  Leukocytosis  WBCs 11.0 on 4/1  Afebrile  Labs ordered for tomorrow 14.  Acute blood loss anemia  Hemoglobin 10.0 on 4/29  Labs ordered for tomorrow 15.  Urinary frequency  PVRs unremarkable  UA/culture ordered   LOS: 5 days A FACE TO FACE EVALUATION WAS PERFORMED  Ankit Karis Juba 07/24/2018, 8:35 AM

## 2018-07-24 NOTE — Progress Notes (Signed)
Slept good. Used call light for assistance to BR. Decreased urinary frequency. Michael Mercado

## 2018-07-25 ENCOUNTER — Inpatient Hospital Stay (HOSPITAL_COMMUNITY): Payer: Medicare Other | Admitting: Occupational Therapy

## 2018-07-25 ENCOUNTER — Encounter: Payer: Self-pay | Admitting: *Deleted

## 2018-07-25 ENCOUNTER — Inpatient Hospital Stay (HOSPITAL_COMMUNITY): Payer: Medicare Other | Admitting: Speech Pathology

## 2018-07-25 ENCOUNTER — Inpatient Hospital Stay (HOSPITAL_COMMUNITY): Payer: Medicare Other | Admitting: Physical Therapy

## 2018-07-25 DIAGNOSIS — S065X0S Traumatic subdural hemorrhage without loss of consciousness, sequela: Secondary | ICD-10-CM

## 2018-07-25 LAB — CBC WITH DIFFERENTIAL/PLATELET
Abs Immature Granulocytes: 0.03 10*3/uL (ref 0.00–0.07)
Basophils Absolute: 0 10*3/uL (ref 0.0–0.1)
Basophils Relative: 0 %
Eosinophils Absolute: 0.3 10*3/uL (ref 0.0–0.5)
Eosinophils Relative: 4 %
HCT: 34.7 % — ABNORMAL LOW (ref 39.0–52.0)
Hemoglobin: 10.9 g/dL — ABNORMAL LOW (ref 13.0–17.0)
Immature Granulocytes: 0 %
Lymphocytes Relative: 18 %
Lymphs Abs: 1.3 10*3/uL (ref 0.7–4.0)
MCH: 28 pg (ref 26.0–34.0)
MCHC: 31.4 g/dL (ref 30.0–36.0)
MCV: 89.2 fL (ref 80.0–100.0)
Monocytes Absolute: 0.7 10*3/uL (ref 0.1–1.0)
Monocytes Relative: 9 %
Neutro Abs: 4.9 10*3/uL (ref 1.7–7.7)
Neutrophils Relative %: 69 %
Platelets: 263 10*3/uL (ref 150–400)
RBC: 3.89 MIL/uL — ABNORMAL LOW (ref 4.22–5.81)
RDW: 14.7 % (ref 11.5–15.5)
WBC: 7.2 10*3/uL (ref 4.0–10.5)
nRBC: 0 % (ref 0.0–0.2)

## 2018-07-25 LAB — BASIC METABOLIC PANEL
Anion gap: 7 (ref 5–15)
BUN: 18 mg/dL (ref 8–23)
CO2: 26 mmol/L (ref 22–32)
Calcium: 8.7 mg/dL — ABNORMAL LOW (ref 8.9–10.3)
Chloride: 103 mmol/L (ref 98–111)
Creatinine, Ser: 1.09 mg/dL (ref 0.61–1.24)
GFR calc Af Amer: >60 mL/min (ref >60)
GFR calc non Af Amer: >60 mL/min (ref >60)
Glucose, Bld: 98 mg/dL (ref 70–99)
Potassium: 4.3 mmol/L (ref 3.5–5.1)
Sodium: 136 mmol/L (ref 135–145)

## 2018-07-25 NOTE — Progress Notes (Signed)
Physical Therapy Session Note  Patient Details  Name: Michael Mercado MRN: [id number] Date of Birth: 11-17-1940  Today's Date: 07/25/2018 PT Individual Time: 1107-1200 PT Individual Time Calculation (min): 53 min   Short Term Goals: Week 1:  PT Short Term Goal 1 (Week 1): STG=LTG due to ELOS  Skilled Therapeutic Interventions/Progress Updates:  Pt presented in bed sleeping but easily aroused and agreeable to therapy. First half of session focused on functional transfers and mobility as members from group home to observe via video conf. Pt performed bed mobility supine to sit with supervision to EOB with PTA providing x 1 verbal cue for use of bed rail to facilitate transfer. Performed sit to/from stand x 5 without AD demonstrating no LOB nor posterior lean upon standing. Performed ambulatory transfer to/from recliner with supervision, pt demonstrated good control sitting in recliner. Pt performed toe taps to target (empty glove box) with pt able to demonstrate wt shifting without AD and maintain balance. Pt did require cues for following commands as pt attempted to perform activity while sitting. Pt then ambulated with rollator approx 175f with supervision however pt required assist to maintain pants up due to excessive size of paper scrubs. Upon return to room pt agreeable to ambulate to rehab gym, performed in same manner with pt demonstrating good safety and posture with rollator. Pt participated in x 2 bouts of horseshoes without AD with pt able to hold horseshoes and throw without LOB and close guarding. Pt indicating need for bathroom after horseshoes, pt ambulated back to room supervision level and transferred to bathroom. Pt able to perform toilet transfers no AD with supervision and performed peri-care mod I. Performed and hygiene at sink after toileting with supervision and pt agreeable to continue with therapy. Pt ambulated to day room and participated in NuStep L3 x 5 min for global  conditional and attempt to sustain task. Pt stopped approx 20-30 sec however able to be re-directed to continue NuStep. Pt ambulated back to room at end of session in same manner as prior and requesting to return to bed. Pt performed sit to supine on flat bed with supervision and repositioned self to comfort. Pt left with bed alarm on, call bell within reach and current needs met.      Therapy Documentation Precautions:  Precautions Precautions: Fall Precaution Comments: Rt inattention Restrictions Weight Bearing Restrictions: No General:   Vital Signs:  Pain: Pain Assessment Pain Scale: Faces Faces Pain Scale: Hurts a little bit Pain Location: Head Pain Descriptors / Indicators: Headache Patients Stated Pain Goal: 2 Pain Intervention(s): Medication (See eMAR)   Therapy/Group: Individual Therapy  Aleksey Newbern  Emilie Carp, PTA  07/25/2018, 12:55 PM

## 2018-07-25 NOTE — Progress Notes (Signed)
[place] PHYSICAL MEDICINE & REHABILITATION PROGRESS NOTE   Subjective/Complaints: No issues overntie  ROS: Limited due to cognition   Objective:   No results found. No results for input(s): WBC, HGB, HCT, PLT in the last 72 hours. Recent Labs    07/22/18 1016  NA 135  K 3.7  CL 105  CO2 23  GLUCOSE 128*  BUN 20  CREATININE 1.10  CALCIUM 8.1*    Intake/Output Summary (Last 24 hours) at 07/25/2018 0921 Last data filed at 07/25/2018 0900 Gross per 24 hour  Intake 960 ml  Output 900 ml  Net 60 ml     Physical Exam: Vital Signs Blood pressure (!) 153/71, pulse (!) 48, temperature 97.7 F (36.5 C), resp. rate 16, height 5\' 6"  (1.676 m), weight 76.9 kg, SpO2 98 %. Constitutional: NAD.  Vital signs reviewed. HENT: Crani site C/C/I. Eyes: No discharge. Cardiovascular: No JVD    Respiratory: Normal effort    GI: Nondistended. Musculoskeletal: No edema or tenderness in extremities Neurological:  Alert Dysarthria Motor: Limited due to ability follow commands, however moving all extremities, unchanged  Skin: Skin iswarm.  See above Psychiatric:Altered mentation  Assessment/Plan: 1. Functional deficits secondary to TBI which require 3+ hours per day of interdisciplinary therapy in a comprehensive inpatient rehab setting.  Physiatrist is providing close team supervision and 24 hour management of active medical problems listed below.  Physiatrist and rehab team continue to assess barriers to discharge/monitor patient progress toward functional and medical goals  Care Tool:  Bathing  Bathing activity did not occur: Safety/medical concerns Body parts bathed by patient: Right arm, Left arm, Chest, Abdomen, Front perineal area, Buttocks, Right upper leg, Left upper leg, Right lower leg, Left lower leg, Face   Body parts bathed by helper: Right lower leg, Left lower leg     Bathing assist Assist Level: Contact Guard/Touching assist     Upper Body  Dressing/Undressing Upper body dressing   What is the patient wearing?: Hospital gown only    Upper body assist Assist Level: Contact Guard/Touching assist    Lower Body Dressing/Undressing Lower body dressing      What is the patient wearing?: Incontinence brief     Lower body assist Assist for lower body dressing: Contact Guard/Touching assist     Toileting Toileting    Toileting assist Assist for toileting: Contact Guard/Touching assist     Transfers Chair/bed transfer  Transfers assist     Chair/bed transfer assist level: Supervision/Verbal cueing     Locomotion Ambulation   Ambulation assist      Assist level: Supervision/Verbal cueing Assistive device: Walker-rolling Max distance: 150   Walk 10 feet activity   Assist     Assist level: Supervision/Verbal cueing Assistive device: Rollator   Walk 50 feet activity   Assist    Assist level: Supervision/Verbal cueing Assistive device: Rollator    Walk 150 feet activity   Assist Walk 150 feet activity did not occur: Refused  Assist level: Supervision/Verbal cueing Assistive device: Rollator    Walk 10 feet on uneven surface  activity   Assist Walk 10 feet on uneven surfaces activity did not occur: Refused   Assist level: Contact Guard/Touching assist     Wheelchair     Assist   Type of Wheelchair: Manual    Wheelchair assist level: Minimal Assistance - Patient > 75% Max wheelchair distance: 75    Wheelchair 50 feet with 2 turns activity    Assist    Wheelchair 50 feet  with 2 turns activity did not occur: Safety/medical concerns   Assist Level: Minimal Assistance - Patient > 75%   Wheelchair 150 feet activity     Assist Wheelchair 150 feet activity did not occur: Safety/medical concerns       Medical Problem List and Plan: 1.Decreased functional mobility with right side weaknesssecondary to traumatic left subdural hematoma. Status post craniotomy  evacuation 07/07/2018 followed by reaccumulation of SDH with redo craniotomy 07/13/2018  Continue CIR PT, OT, SLP 2. Antithrombotics: -DVT/anticoagulation:SCDs -antiplatelet therapy: N/A 3. Pain Management:Tylenol as needed 4. Mood/mild mental retardation with schizophrenia:Patient on Klonopin 0.5 mg daily prior to admission and Paxil 20 mg daily, Risperdal1 mg twice a day. Resume as needed -antipsychotic agents: N/A  -sleep chart  -ritalin trial   -limit neurosedating meds as possible  -resumed Paxil on 4/3 5. Neuropsych: This patienthe has notcapable of making decisions on hisown behalf.  Continue tele-sitter for safety 6. Skin/Wound Care:Routine skin checks 7. Fluids/Electrolytes/Nutrition:  -encourage po  BMP within acceptable range on 4/3, except for glucose 8. Seizure prophylaxis. Keppra 500 mg twice a day 9. Dysphagia. Dysphagia #1 thin liquids.  Advanced to D2 thins, continue to advance as tolerated 10.Tobacco abuse. Counseling 11.  Labile blood pressure   Vitals:   07/24/18 2047 07/25/18 0359  BP: (!) 142/88 (!) 153/71  Pulse: (!) 54 (!) 48  Resp: 16 16  Temp: 98 F (36.7 C) 97.7 F (36.5 C)  SpO2: 97% 98%   12.  Hyperglycemia  ?  Trending up on 4/3 CBG (last 3)  No results for input(s): GLUCAP in the last 72 hours.  13.  Leukocytosis  WBCs 11.0 on 4/1  Afebrile  14.  Acute blood loss anemia  Hemoglobin 10.0 on 4/29  Labs ordered for tomorrow 15.  Urinary frequency- able to inform staff of BR needs  PVRs unremarkable  UA/culture ordered   LOS: 6 days A FACE TO FACE EVALUATION WAS PERFORMED  Erick Colace 07/25/2018, 9:21 AM

## 2018-07-25 NOTE — Progress Notes (Signed)
Occupational Therapy Session Note  Patient Details  Name: URIJAH BITTING MRN: [id number] Date of Birth: 08/08/1940  Today's Date: 07/25/2018 OT Individual Time: 0930-1040 OT Individual Time Calculation (min): 70 min    Short Term Goals: Week 1:  OT Short Term Goal 1 (Week 1): STGs=LTGs secondary to estimated short LOS  Skilled Therapeutic Interventions/Progress Updates:    Upon entering the room, pt supine in bed sleeping soundly but agreeable to OT intervention this session. Pt performed supine >sit with supervision from flat bed. Pt standing with supervision and ambulating with rollator into bathroom for toileting needs. Pt required steady assistance for hygiene and clothing management for standing balance but transferred with use of rollator at supervision level. Pt doffing clothing items while seated on toilet for safety and transferring onto TTB for bathing at overall supervision level. Pt exiting the shower and seated on commode to don clothing items with encouragement for things like socks but he was able to do independently. Min guard for standing balance with LB clothing management this session. Pt returning to bed secondary to fatigue. Pt touching head and verbalizing headache. RN notified and medication given this session with full supervision for liquids while seated on EOB.   Therapy Documentation Precautions:  Precautions Precautions: Fall Precaution Comments: Rt inattention Restrictions Weight Bearing Restrictions: No General:   Vital Signs:  Pain: Pain Assessment Pain Scale: Faces Faces Pain Scale: Hurts a little bit Pain Location: Head Pain Descriptors / Indicators: Headache Patients Stated Pain Goal: 2 Pain Intervention(s): Medication (See eMAR)   Therapy/Group: Individual Therapy  Alen Bleacher 07/25/2018, 12:04 PM

## 2018-07-25 NOTE — Progress Notes (Signed)
Occupational Therapy Session Note  Patient Details  Name: Michael Mercado MRN: [id number] Date of Birth: [birth year]  Today's Date: 07/25/2018 OT Individual Time: 5320-2334 OT Individual Time Calculation (min): 54 min    Short Term Goals: Week 1:  OT Short Term Goal 1 (Week 1): STGs=LTGs secondary to estimated short LOS  Skilled Therapeutic Interventions/Progress Updates:    Upon entering the room, pt supine in bed and sleeping soundly but agreeable to OT intervention. Pt with no c/o pain this session. Pt performed supine >sit with supervision and ambulating to bathroom with use of rollator at supervision level. Pt able to void and then announcing, " I'm finished" and waiting for therapist before standing for clothing management. Pt performed hand hygiene with min cuing for sequencing and supervision for standing balance. Pt ambulating with rollator 150' to gym with close supervision. Pt standing x 2 reps for 2 minutes each time for dynavision task. Pt needing max cuing for visual scanning and to attend to task. Pt doing much better the second time when lights turned off in room and he was able to concentrate on locating red light. Average on R side of board was 8 seconds reaction time and 3.7 seconds was average on L side of board. Pt taking seated rest break and returning to room in same manner as above. Pt needing to void again but ambulating in room without use of AD but close supervision and able to perform toilet transfer, clothing management, and hygiene at overall supervision level. OT demonstrated HEP for B UE strengthening with use of level 2 resistance. Pt needing mod  Multimodal cuing for technique if multiple steps. Paper handout provided. Pt returning to bed at end of session per pt request. Call bell and all needed items within reach upon exiting the room. Bed alarm activated.   Therapy Documentation Precautions:  Precautions Precautions: Fall Precaution Comments: Rt  inattention Restrictions Weight Bearing Restrictions: No   Therapy/Group: Individual Therapy  Alen Bleacher 07/25/2018, 3:56 PM

## 2018-07-25 NOTE — Progress Notes (Signed)
Speech Language Pathology Daily Session Note  Patient Details  Name: Michael Mercado MRN: [id number] Date of Birth: [birth year]  Today's Date: 07/25/2018   Skilled treatment session #1 SLP Individual Time: 0720-0750 SLP Individual Time Calculation (min): 30 min   Skilled treatment session #2 SLP Individual Time: 6314-9702 SLP Individual Time Calculation (min): 40 min  Short Term Goals: Week 1: SLP Short Term Goal 1 (Week 1): Pt will consume thin liquids with minimal overt s/s of aspiration with supervision cues for use of compensatory swallow strategies.  SLP Short Term Goal 2 (Week 1): Pt will consume trials of dysphagia 3 with minimal overt s/s of aspiration/dysphagia and supervision cues.  SLP Short Term Goal 2 - Progress (Week 1): Updated due to goal met  Skilled Therapeutic Interventions:  Skilled treatment session #1 focused on dysphagia goals. SLP facilitiated session by providing skilled observation of pt consuming dysphagia 2 breakfast tray with thin liquids via cup. Pt consumed solids with race residue but he managed well. Occasional anterior tongue movement observed(likely baseline for pt). Pt with 2 coughs during consumption of 18 oz thin liquids via cup. Cough is likely related to rate of consumption and large bolus. Cues given with pt mildly responsive.   Skilled treatment session #2 focused on dysphagia goals. SLP facilitated session by providing skilled observation of pt consuming trial of dysphagia 3 (graham crackers). Pt with increased mastication and decreased bolus cohesion. Pt with no overt s/s of aspiration. Will continue to pursue trials at bedside prior to full pugrade. Pt left upright in bed, bed alarm on and all needs within reach. Continue per current plan of care.        Pain    Therapy/Group: Individual Therapy  Dom Haverland 07/25/2018, 9:24 AM

## 2018-07-26 ENCOUNTER — Inpatient Hospital Stay (HOSPITAL_COMMUNITY): Payer: Medicare Other

## 2018-07-26 ENCOUNTER — Inpatient Hospital Stay (HOSPITAL_COMMUNITY): Payer: Medicare Other | Admitting: Occupational Therapy

## 2018-07-26 ENCOUNTER — Inpatient Hospital Stay (HOSPITAL_COMMUNITY): Payer: Medicare Other | Admitting: Physical Therapy

## 2018-07-26 NOTE — Progress Notes (Signed)
Physical Therapy Session Note  Patient Details  Name: Michael Mercado MRN: [id number] Date of Birth: 1940-11-15  Today's Date: 07/26/2018 PT Individual Time: 0915-1020 PT Individual Time Calculation (min): 65 min   Short Term Goals: Week 1:  PT Short Term Goal 1 (Week 1): STG=LTG due to ELOS  Skilled Therapeutic Interventions/Progress Updates: Pt presented in bed sleeping but easily aroused and agreeable to therapy. Pt performed bed mobility with supervision and use of bed features with some increased time. Pt ambulated HHA to bathroom and performed clothing management and toilet transfers with supervision. Pt then transported to ortho gym via rollator and participated in car transfer CGA into car, supervision out of car. Pt then ambulated to rehab gym with rollator supervision. Pt ascended/descend x 4 steps CGA with step to pattern however refused any additional steps. Pt participated in ball bounce off rebounder for sustained task and dynamic balance with close S. Pt noted to incorporate BUE into catching ball without verbal cues. Pt participated in additional grad day activities all with CGA to close S. Pt frequently asking to return to room only stating "tired", pt was able to be re-directed most of session to continue participation in therapy. As session progressed pt rubbing head more frequently. Pt asked if head hurt and needed pain with pt nodding in confirmation. Pt ambulated back to room supervision level, used toilet in same manner as prior and transferred back to bed(flat) supervision level. Pt left in bed with HOB elevated, call bell within reach and bed alarm on with nsg notified.      Therapy Documentation Precautions:  Precautions Precautions: Fall Precaution Comments: Rt inattention Restrictions Weight Bearing Restrictions: No General: PT Amount of Missed Time (min): 10 Minutes PT Missed Treatment Reason: Patient fatigue;Pain Vital Signs:  Pain: Pain Assessment Pain  Score: 0-No pain    Therapy/Group: Individual Therapy  Katrinia Straker  Syris Brookens, PTA  07/26/2018, 3:11 PM

## 2018-07-26 NOTE — Progress Notes (Signed)
[place] PHYSICAL MEDICINE & REHABILITATION PROGRESS NOTE   Subjective/Complaints: Patient oriented to person, is able to follow simple commands but only for short duration of time, patient loses attention  ROS: Limited due to cognition   Objective:   No results found. Recent Labs    07/25/18 1010  WBC 7.2  HGB 10.9*  HCT 34.7*  PLT 263   Recent Labs    07/25/18 1010  NA 136  K 4.3  CL 103  CO2 26  GLUCOSE 98  BUN 18  CREATININE 1.09  CALCIUM 8.7*    Intake/Output Summary (Last 24 hours) at 07/26/2018 1006 Last data filed at 07/26/2018 0700 Gross per 24 hour  Intake 600 ml  Output 1183 ml  Net -583 ml     Physical Exam: Vital Signs Blood pressure 140/70, pulse (!) 55, temperature 97.7 F (36.5 C), temperature source Oral, resp. rate 17, height 5\' 6"  (1.676 m), weight 76.9 kg, SpO2 96 %. Constitutional: NAD.  Vital signs reviewed. HENT: Crani site C/C/I. Eyes: No discharge. Cardiovascular: No JVD    Respiratory: Normal effort    GI: Nondistended. Musculoskeletal: No edema or tenderness in extremities Neurological:  Alert Dysarthria Motor: Limited due to ability follow commands, however moving all extremities, unchanged  He does have antigravity in bilateral upper and lower limbs he does have mild weakness on the right side versus the left Skin: Skin iswarm.  See above Psychiatric:Altered mentation  Assessment/Plan: 1. Functional deficits secondary to TBI which require 3+ hours per day of interdisciplinary therapy in a comprehensive inpatient rehab setting.  Physiatrist is providing close team supervision and 24 hour management of active medical problems listed below.  Physiatrist and rehab team continue to assess barriers to discharge/monitor patient progress toward functional and medical goals  Care Tool:  Bathing  Bathing activity did not occur: Safety/medical concerns Body parts bathed by patient: Right arm, Left arm, Chest, Abdomen, Front  perineal area, Buttocks, Right upper leg, Left upper leg, Right lower leg, Left lower leg, Face   Body parts bathed by helper: Right lower leg, Left lower leg     Bathing assist Assist Level: Supervision/Verbal cueing     Upper Body Dressing/Undressing Upper body dressing   What is the patient wearing?: Pull over shirt    Upper body assist Assist Level: Set up assist    Lower Body Dressing/Undressing Lower body dressing      What is the patient wearing?: Incontinence brief, Underwear/pull up, Pants     Lower body assist Assist for lower body dressing: Contact Guard/Touching assist     Toileting Toileting    Toileting assist Assist for toileting: Supervision/Verbal cueing     Transfers Chair/bed transfer  Transfers assist     Chair/bed transfer assist level: Supervision/Verbal cueing     Locomotion Ambulation   Ambulation assist      Assist level: Supervision/Verbal cueing Assistive device: Rollator Max distance: 150'   Walk 10 feet activity   Assist     Assist level: Supervision/Verbal cueing Assistive device: Rollator   Walk 50 feet activity   Assist    Assist level: Supervision/Verbal cueing Assistive device: Rollator    Walk 150 feet activity   Assist Walk 150 feet activity did not occur: Refused  Assist level: Supervision/Verbal cueing Assistive device: Rollator    Walk 10 feet on uneven surface  activity   Assist Walk 10 feet on uneven surfaces activity did not occur: Refused   Assist level: Contact Guard/Touching assist  Wheelchair     Assist   Type of Wheelchair: Chief of StaffManual    Wheelchair assist level: Minimal Assistance - Patient > 75% Max wheelchair distance: 75    Wheelchair 50 feet with 2 turns activity    Assist    Wheelchair 50 feet with 2 turns activity did not occur: Safety/medical concerns   Assist Level: Minimal Assistance - Patient > 75%   Wheelchair 150 feet activity     Assist  Wheelchair 150 feet activity did not occur: Safety/medical concerns       Medical Problem List and Plan: 1.Decreased functional mobility with right side weaknesssecondary to traumatic left subdural hematoma. Status post craniotomy evacuation 07/07/2018 followed by reaccumulation of SDH with redo craniotomy 07/13/2018  Continue CIR PT, OT, SLP, team conference in a.m. 2. Antithrombotics: -DVT/anticoagulation:SCDs -antiplatelet therapy: N/A 3. Pain Management:Tylenol as needed 4. Mood/mild mental retardation with schizophrenia:Patient on Klonopin 0.5 mg daily prior to admission and Paxil 20 mg daily, Risperdal1 mg twice a day. Resume as needed -antipsychotic agents: N/A  -sleep chart  -ritalin trial   -limit neurosedating meds as possible  -resumed Paxil on 4/3 Question what his baseline orientation is, will discuss with speech therapy during team conference in a.m. 5. Neuropsych: This patienthe has notcapable of making decisions on hisown behalf.  Continue tele-sitter for safety 6. Skin/Wound Care:Routine skin checks 7. Fluids/Electrolytes/Nutrition:  -encourage po  BMP within acceptable range on 4/3, except for glucose 8. Seizure prophylaxis. Keppra 500 mg twice a day 9. Dysphagia. Dysphagia #1 thin liquids.  Advanced to D2 thins, continue to advance as tolerated 10.Tobacco abuse. Counseling 11.  Labile blood pressure   Vitals:   07/25/18 2300 07/26/18 0407  BP:  140/70  Pulse: (!) 48 (!) 55  Resp:  17  Temp:  97.7 F (36.5 C)  SpO2: 97% 96%    13.  Leukocytosis, resolved  WBCs 11.0 on 4/1, 7K on 4/6  Afebrile  14.  Acute blood loss anemia  Hemoglobin 10.9 on 4/6, stable   15.  Urinary frequency- able to inform staff of BR needs  PVRs unremarkable  UA/culture ordered   LOS: 7 days A FACE TO FACE EVALUATION WAS PERFORMED  Erick Colacendrew E Vyom Brass 07/26/2018, 10:06 AM

## 2018-07-26 NOTE — Progress Notes (Signed)
Speech Language Pathology Discharge Summary  Patient Details  Name: QUAME SPRATLIN MRN: [id number] Date of Birth: 1940/06/01  Today's Date: 07/26/2018 SLP Individual Time: 1300-1357 SLP Individual Time Calculation (min): 57 min   Skilled Therapeutic Interventions:  Skilled ST services focused on education and swallow skills. SLP facilitated PO consumption trials dys 3 snack and dys 2 lunch trya (ST ordered dys 3 tray however items appeared dys2 texture), pt required supervision A verbal cues to reduce bolus size and slow rate with x1 cough noted on large bolus of dys 2 texture, likely due to reduced bolus chohenison and atypical mastication. Pt demonstrated no overt s/s aspiration of dys 3 snack, however ST continues to recommend dys 2 diet, due to need for supervision, increase rate/bolus size and to reduce aspiration risk.  SLP provided deit handout, educated pt on handout and placed handout in therapy notebook for caregivers to access. Pt was left in room with call bell within reach and bed/chair alarm set. ST recommends to continue skilled ST services.     Patient has met 3 of 3 long term goals.  Patient to discharge at overall Supervision level.  Reasons goals not met:     Clinical Impression/Discharge Summary:   Pt made great progress meeting 3 out 3 goal, discharging at supervision A for swallow strategies for dys 2 and thin via cup diet. Pt continues to demonstrate atypical oral phrase, fast rate and likely reduced bolus cohesion, however mild risk of aspiration with supervision on current diet. Pt demonstrates only intermitterment overt s/s aspiration, immediate coughing, when consuming large bolus sizes of solids and liquids, however (per primary ST notes) caregiver confirmed baseline behaviors. Per pt's description (per ST notes) pt likely consumed dys 2 diet, possibly dys 3 at times at baseline. SLP recommends dys 2 and thin via cup diet, with full supervision and medication whole one  at a time. Diet recommendation handout has been reviewed with patient and placed in therapy notebook for caregivers to access. All education has been completed. Pt benefited from skilled ST services in order to maximize functional independence and reduce burden of care, SLP recommends 24 hour supervision and continuing of [facility] services for further solid advancement if needed.   Care Partner:  Caregiver Able to Provide Assistance: Yes  Type of Caregiver Assistance: Physical;Cognitive  Recommendation:  Home Health SLP  Rationale for SLP Follow Up: Maximize swallowing safety;Reduce caregiver burden   Equipment: N/A   Reasons for discharge: Discharged from hospital   Patient/Family Agrees with Progress Made and Goals Achieved: Yes    Chidi Shirer 07/26/2018, 1:30 PM

## 2018-07-26 NOTE — Progress Notes (Signed)
Occupational Therapy Session Note  Patient Details  Name: Michael Mercado MRN: [id number] Date of Birth: 04/01/1941  Today's Date: 07/26/2018 OT Individual Time: 4765-4650 OT Individual Time Calculation (min): 72 min    Short Term Goals: Week 1:  OT Short Term Goal 1 (Week 1): STGs=LTGs secondary to estimated short LOS  Skilled Therapeutic Interventions/Progress Updates:    Upon entering the room, pt supine in bed and sleeping soundly but agreeable to OT intervention.  No c/o pain this session. Pt ambulating with rollator to bathroom x 2 this session with need to void at overall supervision level for transfer, clothing management, and hygiene. Pt performed hand washing while standing at supervision level as well. OT assisted pt outside via wheelchair for energy conservation. Pt ambulating with rollator on uneven surface with use of rollator 200' with supervision. Pt taking seated rest break. OT assisted pt back to room, and pt requesting to shave. Pt required encouragement but able to complete task on his own while standing. Pt returning to bed with RN present to remove staples.   Therapy Documentation Precautions:  Precautions Precautions: Fall Precaution Comments: Rt inattention Restrictions Weight Bearing Restrictions: No Vital Signs: Therapy Vitals Pulse Rate: (!) 57 Resp: 19 BP: 122/76 Patient Position (if appropriate): Lying Oxygen Therapy SpO2: 99 % O2 Device: Room Air Pain: Pain Assessment Pain Score: 0-No pain   Therapy/Group: Individual Therapy  Alen Bleacher 07/26/2018, 4:15 PM

## 2018-07-26 NOTE — Discharge Summary (Addendum)
Physician Discharge Summary  Patient ID: Michael Mercado MRN: [id number] DOB/AGE: [birth year] 78 y.o.  Admit date: 07/19/2018 Discharge date: 08/06/2018  Discharge Diagnoses:  Active Problems:   Traumatic subdural hematoma (HCC)   Urinary frequency   Acute blood loss anemia   Leukocytosis   Labile blood pressure   Hyperglycemia   Dysphagia seizure prophylaxis Tobacco abuse  Discharged Condition: stable  Significant Diagnostic Studies: Ct Head Wo Contrast  Result Date: 07/14/2018 CLINICAL DATA:  Subdural hematoma. Repeat craniotomy yesterday for subdural evacuation EXAM: CT HEAD WITHOUT CONTRAST TECHNIQUE: Contiguous axial images were obtained from the base of the skull through the vertex without intravenous contrast. COMPARISON:  CT head 07/11/2018, 07/08/2018 FINDINGS: Brain: Interval partial vacuo a shin of subdural hematoma on the left. Decreased size of acute high-density hemorrhage. Increased subdural gas. Interval placement of subdural drain in good position in the subdural space extending anteriorly. Mass-effect and 6 mm midline shift to the right has improved following evacuation of subdural hematoma. Decreased edema in the left hemisphere. No acute infarct.  Negative for hydrocephalus. Vascular: Negative for hyperdense vessel Skull: Left craniotomy.  Craniotomy flap in good position. Sinuses/Orbits: Mucosal edema paranasal sinuses. Feeding tube through the right nose. Other: None IMPRESSION: Interval redo craniotomy for subdural evacuation. Decreased subdural blood on the left with subdural drain now in place. Decreased mass-effect and midline shift now 6 mm. Electronically Signed   By: Marlan Palau M.D.   On: 07/14/2018 08:23   Ct Head Wo Contrast  Result Date: 07/11/2018 CLINICAL DATA:  Followup subdural hematoma. EXAM: CT HEAD WITHOUT CONTRAST TECHNIQUE: Contiguous axial images were obtained from the base of the skull through the vertex without intravenous contrast.  COMPARISON:  07/08/2018 FINDINGS: Brain: Left-sided subdural drain has been removed. The overall volume blood and air in the left convexity subdural space is very slightly diminished when measured in multiple locations. No evidence of additional bleeding. Maximal thickness remains approximately 2.4 cm in the frontal region. Mass effect persists, with left-to-right shift measuring up to 14 mm today, increased from maximal measurement of 13 mm previously. There is flattening of the left lateral ventricle. No definite trapping of the right lateral ventricle. No evidence brain infarction. No new finding. Vascular: There is atherosclerotic calcification of the major vessels at the base of the brain. Skull: Left frontoparietal craniotomy. Sinuses/Orbits: Clear except for mild seasonal mucosal thickening and a few retention cysts. Orbits negative. Other: None IMPRESSION: Left subdural drain has been removed. Persistent subdural blood and air on the left, possibly very mildly diminished. No evidence of worsening or new bleeding. Continued mass effect with left-to-right shift of approximately 14 mm, perhaps a mm increased from the previous study. Maximal thickness of the subdural collection in the frontal region measures 2.4 cm. Electronically Signed   By: Paulina Fusi M.D.   On: 07/11/2018 13:54   Ct Head Wo Contrast  Result Date: 07/08/2018 CLINICAL DATA:  Follow-up subdural hematoma EXAM: CT HEAD WITHOUT CONTRAST TECHNIQUE: Contiguous axial images were obtained from the base of the skull through the vertex without intravenous contrast. COMPARISON:  Yesterday FINDINGS: Brain: Interval decompression of subdural hematoma via craniotomy. Subdural drain is present. The collection has decreased in size, where residual high-density blood products, gas, and hemostatic material cause a residual collection measuring up to 2 cm in thickness at the frontal convexity. Midline shift has decreased to 1 cm. No entrapment or acute  infarct is noted. Vascular: Atherosclerotic calcification Skull: Unremarkable craniotomy Sinuses/Orbits: Negative IMPRESSION: Interval decompression of  left subdural hematoma. The collection measures up to 2 cm. Midline shift has improved to 1 cm. Electronically Signed   By: Marnee Spring M.D.   On: 07/08/2018 05:36   Dg Chest Port 1 View  Result Date: 07/14/2018 CLINICAL DATA:  Check gastric catheter placement EXAM: PORTABLE CHEST 1 VIEW COMPARISON:  07/08/2018 FINDINGS: Cardiac shadow is stable. The lungs are well aerated bilaterally without focal infiltrate. Feeding catheter is noted with the tip in the mid to distal esophagus. This should be advanced several cm. IMPRESSION: Feeding catheter within the mid to distal esophagus. Electronically Signed   By: Alcide Clever M.D.   On: 07/14/2018 15:36   Dg Chest Port 1 View  Result Date: 07/08/2018 CLINICAL DATA:  Status post extubation.  Hypertension. EXAM: PORTABLE CHEST 1 VIEW COMPARISON:  July 07, 2018 FINDINGS: Endotracheal tube and nasogastric tube have been removed. No pneumothorax. There is no appreciable edema or consolidation. Heart is upper normal in size with pulmonary vascularity normal. No adenopathy. No bone lesions. IMPRESSION: No pneumothorax. No edema or consolidation. Heart upper normal in size. Electronically Signed   By: Bretta Bang III M.D.   On: 07/08/2018 07:46   Dg Chest Port 1 View  Result Date: 07/07/2018 CLINICAL DATA:  ET tube present EXAM: PORTABLE CHEST 1 VIEW COMPARISON:  Portable film earlier today. FINDINGS: Tubes and lines remain stable. No consolidation or edema. Improved LEFT lower lobe atelectasis. IMPRESSION: Stable to improved chest. Electronically Signed   By: Elsie Stain M.D.   On: 07/07/2018 07:26   Dg Chest Portable 1 View  Result Date: 07/07/2018 CLINICAL DATA:  Post intubation and OG tube placement. Hx of HTN and current smoker. EXAM: PORTABLE CHEST 1 VIEW COMPARISON:  None. FINDINGS: Endotracheal  tube tip projects 4.9 cm above the carina. Nasal/orogastric tube passes below the diaphragm and below the included field of view. Cardiac silhouette is normal in size. No mediastinal or hilar masses. There are prominent bronchovascular markings most evident at the bases with additional lung base opacity at likely due to atelectasis. No convincing pneumonia. No evidence of pulmonary edema. No pleural effusion or pneumothorax. Skeletal structures are grossly intact. IMPRESSION: 1. Endotracheal tube tip projects 4.9 cm above the carina. Nasal/orogastric tube passes below the diaphragm into the stomach. 2. No acute cardiopulmonary disease. Electronically Signed   By: Amie Portland M.D.   On: 07/07/2018 02:45   Dg Abd Portable 1v  Result Date: 07/14/2018 CLINICAL DATA:  Check gastric catheter placement EXAM: PORTABLE ABDOMEN - 1 VIEW COMPARISON:  Chest x-ray from earlier in the same day. FINDINGS: Scattered large and small bowel gas is noted. Feeding catheter is noted coiled within the stomach advanced when compared with the prior exam. IMPRESSION: Feeding catheter coiled within the stomach. Electronically Signed   By: Alcide Clever M.D.   On: 07/14/2018 15:37   Dg Swallowing Func-speech Pathology  Result Date: 07/15/2018 Objective Swallowing Evaluation: Type of Study: MBS-Modified Barium Swallow Study  Patient Details Name: DOVBER ERNEST MRN: [id number] Date of Birth: 1940-06-01 Today's Date: 07/15/2018 Time: SLP Start Time (ACUTE ONLY): 1105 -SLP Stop Time (ACUTE ONLY): 1125 SLP Time Calculation (min) (ACUTE ONLY): 20 min Past Medical History: Past Medical History: Diagnosis Date . Hypercholesteremia  . Hypertension  . Moderate intellectual disability  . Mood disorder (HCC)  . MR (mental retardation)  . Obesity  Past Surgical History: Past Surgical History: Procedure Laterality Date . CRANIOTOMY Left 07/07/2018  Procedure: CRANIOTOMY HEMATOMA EVACUATION SUBDURAL;  Surgeon: Marikay Alar  S, MD;  Location: [facility] OR;   Service: Neurosurgery;  Laterality: Left; . CRANIOTOMY Left 07/13/2018  Procedure: Redo Left CRANIOTOMY FOR SUBDURAL HEMATOMA;  Surgeon: Tia AlertJones, David S, MD;  [facility];  Service: Neurosurgery;  Laterality: Left; HPI: Pt is a 78 year old male with history of moderate intellectual disablity, tobacco abuse, COPD,  HTN, HLD, CKD stage II who [facility] 07[facility]06[facility] after fall.  Apparently he slipped and fell getting out of shower but denied LOC.  He was evaluated, asymptomatic, and sent home but then returned on 07/07/18 after additional fall from bed around 0115. At that time he was unresponsive with right facial droop and hypertensive. Code stroke activated. CT head showed large subdural hematoma 19 mm of left-to-right shift.  He was intubated for airway protection and [facility]. Left craniotomy was completed on 07/07/18 for exacuation and pt was extubated following surgery. Re-do craniotomy 3/25.   No data recorded Assessment / Plan / Recommendation CHL IP CLINICAL IMPRESSIONS 07/15/2018 Clinical Impression Pt exhibits an acute on (suspected) chronic dysphagia from sensorimotor deficits and anatomical differences leading to reduced initiation, contraction and laryngeal closure. Inconsistent initiation of swallow at the pyriform sinuses and silent (trace-mild) penetration (PAS 2,3) before complete laryngeal closure was achieved with thin. Pt unable to produce effective volitional cough or throat clear. Mild vallecular residue originating from lingual residue and decreased pharyngeal contraction consistent throughout study. Cervical vertebrae has a kyphotic appearance in addition to large/fused bony protrusion in lower cervical esophagus near cricopharyngeus possibly affecting complete UES function. Pt noted to belch/slightly gag early in the study and esophageal scanned revealed copious stasis in upper esophagus that was reduced but not cleared  given additional time to transit. Thin liquids are safer for esophageal phase of deglutition however aspiration risk with thin is high if precautions not followed/no supervision and large sips consumed. To reintroduce po's, recommend initiaton of puree texture (no dentition) and nectar thick liquids with trials of thin using strategies with ST only. Pt should remain upright 30 min after meals, crush meds, cup sips preferred, small sips and cues to cough/throat clear if pt able.      SLP Visit Diagnosis Dysphagia, oropharyngeal phase (R13.12) Attention and concentration deficit following -- Frontal lobe and executive function deficit following -- Impact on safety and function Moderate aspiration risk   CHL IP TREATMENT RECOMMENDATION 07/15/2018 Treatment Recommendations Therapy as outlined in treatment plan below   Prognosis 07/15/2018 Prognosis for Safe Diet Advancement (No Data) Barriers to Reach Goals Cognitive deficits;Other (Comment) Barriers/Prognosis Comment -- CHL IP DIET RECOMMENDATION 07/15/2018 SLP Diet Recommendations Dysphagia 1 (Puree) solids;Nectar thick liquid Liquid Administration via Cup Medication Administration Crushed with puree Compensations Minimize environmental distractions;Slow rate;Small sips/bites;Clear throat intermittently;Hard cough after swallow Postural Changes Remain semi-upright after after feeds/meals (Comment);Seated upright at 90 degrees   CHL IP OTHER RECOMMENDATIONS 07/15/2018 Recommended Consults -- Oral Care Recommendations Oral care BID Other Recommendations --   CHL IP FOLLOW UP RECOMMENDATIONS 07/15/2018 Follow up Recommendations Skilled Nursing facility   [facility]QU[facility] D[facility] 07/15/2018 Speech Therapy Frequency (ACUTE ONLY) min 2x/week Treatment Duration 2 weeks      CHL IP ORAL PHASE 07/15/2018 Oral Phase Impaired Oral - Pudding Teaspoon -- Oral - Pudding Cup -- Oral - Honey Teaspoon -- Oral - Honey Cup -- Oral - Nectar Teaspoon Lingual/palatal residue Oral - Nectar  Cup Lingual/palatal residue Oral - Nectar Straw Lingual/palatal residue Oral - Thin [facility]aspoon [facility]  Oral - Thin Cup Lingual/palatal residue Oral - Thin Straw -- Oral - Puree Lingual/palatal residue Oral - Mech Soft -- Oral - Regular -- Oral - Multi-Consistency -- Oral - Pill -- Oral Phase - Comment --  CHL IP PHARYNGEAL PHASE 07/15/2018 Pharyngeal Phase Impaired Pharyngeal- Pudding Teaspoon -- Pharyngeal -- Pharyngeal- Pudding Cup -- Pharyngeal -- Pharyngeal- Honey Teaspoon -- Pharyngeal -- Pharyngeal- Honey Cup -- Pharyngeal -- Pharyngeal- Nectar Teaspoon Delayed swallow initiation-pyriform sinuses;Pharyngeal residue - valleculae Pharyngeal -- Pharyngeal- Nectar Cup Pharyngeal residue - valleculae;Delayed swallow initiation-pyriform sinuses Pharyngeal -- Pharyngeal- Nectar Straw Pharyngeal residue - valleculae Pharyngeal -- Pharyngeal- Thin Teaspoon Penetration/Aspiration during swallow;Penetration/Aspiration before swallow Pharyngeal Material enters airway, remains ABOVE vocal cords then ejected out;Material enters airway, remains ABOVE vocal cords and not ejected out Pharyngeal- Thin Cup -- Pharyngeal -- Pharyngeal- Thin Straw -- Pharyngeal -- Pharyngeal- Puree Pharyngeal residue - valleculae Pharyngeal -- Pharyngeal- Mechanical Soft -- Pharyngeal -- Pharyngeal- Regular -- Pharyngeal -- Pharyngeal- Multi-consistency -- Pharyngeal -- Pharyngeal- Pill -- Pharyngeal -- Pharyngeal Comment --  CHL IP CERVICAL ESOPHAGEAL PHASE 07/15/2018 Cervical Esophageal Phase (No Data) Pudding Teaspoon -- Pudding Cup -- Honey Teaspoon -- Honey Cup -- Nectar Teaspoon -- Nectar Cup -- Nectar Straw -- Thin Teaspoon -- Thin Cup -- Thin Straw -- Puree -- Mechanical Soft -- Regular -- Multi-consistency -- Pill -- Cervical Esophageal Comment -- [name] 07/15/2018, 2:44 PM [name] Litaker M.Ed Sports administrator Pager [phone] Office [phone]              Ct Head Code Stroke Wo Contrast  Result Date:  07/07/2018 CLINICAL DATA:  Code stroke. Initial evaluation for acute sudden onset weakness, prior fall. EXAM: CT HEAD WITHOUT CONTRAST TECHNIQUE: Contiguous axial images were obtained from the base of the skull through the vertex without intravenous contrast. COMPARISON:  None. FINDINGS: Brain: Large mixed attenuation left subdural hemorrhage overlies the left cerebral convexity, measuring up to 2.8 cm in maximal diameter at the left frontal convexity. Scattered acute blood products present within this collection. Associated mass effect on the subjacent left cerebral hemisphere with up to 19 mm of left-to-right shift. Left lateral ventricle partially effaced. Asymmetric dilatation of the right lateral ventricle concerning for ventricular trapping. Basilar cisterns partially effaced but remain patent at this time. No other acute intracranial hemorrhage. No large vessel territory infarct. No appreciable mass lesion. Vascular: No hyperdense vessel. Skull: Oval small scalp contusion at the left occipital scalp. No calvarial fracture. Sinuses/Orbits: Globes and orbital soft tissues demonstrate no acute finding. Paranasal sinuses and mastoid air cells are clear. Other: None. IMPRESSION: 1. Large mixed attenuation left holo hemispheric subdural hematoma measuring up to 2.8 cm in maximal diameter. Associated extensive mass effect on the subjacent left cerebral hemisphere with up to 19 mm of left-to-right shift. Emergent neuro surgical consultation recommended. 2. Asymmetric dilatation of the right lateral ventricle, concerning for early ventricular trapping. 3. Small left occipital scalp contusion.  No calvarial fracture. Critical Value/emergent results were called by telephone at the time of interpretation on 07/07/2018 at 2:07 am to Dr. Marily Memos , who verbally acknowledged these results. Electronically Signed   By: Rise Mu M.D.   On: 07/07/2018 02:14    Labs:  Basic Metabolic Panel: No results for  input(s): NA, K, CL, CO2, GLUCOSE, BUN, CREATININE, CALCIUM, MG, PHOS in the last 168 hours.  CBC: No results for input(s): WBC, NEUTROABS, HGB, HCT, MCV, PLT in the last 168 hours.  CBG: No results for input(s): GLUCAP  in the last 168 hours. Family history. Sister with CVA. No known reported diabetes or cancer  Brief HPI:    HUSSAIN MAIMONE is a 78 year old right-handed male with history of hypertension on Norvasc, hyperlipidemia, mild mental retardation with anxiety and schizophrenia maintained on Klonopin daily as needed, risperdal  1 mg twice daily as well as Paxil at bedtime as well as history of tobacco abuse. Per report patient lives at [facility] home where he has resided for a number of years. He was able to manage basic ADLs prior to admission was also doing some simple volunteer work. Presented 07/07/2018 after a fall getting out of the shower. Patient struck his head but did not lose consciousness. Cranial CT scan showed a large mixed attenuation left whole low hemispheric subdural hematoma measuring 2.8 cm in maximal diameter with associated extensive mass effect and left-to-right midline shift. Underwent left craniotomy for evacuation of subdural hematoma 07/07/2018 per Dr. Marikay Alar. He was extubated 07/07/2018 maintained on Keppra for seizure prophylaxis. On 07/13/2018 more difficult to arouse was not moving his right arm as well as some increased facial weakness. Follow-up neurosurgery felt need for reexploration underwent redo left craniotomy for evacuation of acute subdural hematoma 07/13/2018. He did require nasogastric tube for a short time for nutritional support and diet slowly advanced. Follow-up CT scan showed decreased subdural blood on the left with subdural drain that had since been removed. Decreased mass effect and midline shift now 6 mm as compared to 13 mm. Level of alertness continued to improve. Therapy evaluations completed and patient was admitted for a  comprehensive rehabilitation program.  Hospital Course: KENDEL BESSEY was admitted to rehab 07/19/2018 for inpatient therapies to consist of PT, ST and OT at least three hours five days a week. Past admission physiatrist, therapy team and rehab RN have worked together to provide customized collaborative inpatient rehab.pertaining to patient traumatic left subdural hematoma had undergone craniotomy 07/07/2018 followed by reaccumulation of subdural hematoma redo craniotomy 07/13/2018. Surgical site healing nicely he would follow-up neurosurgery. SCDs for DVT prophylaxis. Noted history of mild mental retardation and documented schizophrenia. Patient maintained on Paxil during his hospital stay as well as trial  low dose Ritalin to help patient maintain focus and ability to complete tasks that was later discontinued. He could resume his Klonopin and risperdal  if needed after discharge. He continued on Keppra 500 mg twice daily for seizure prophylaxis no seizure activity noted. His diet had been advanced to a dysphagia #2 thin liquid. Blood pressure somewhat labile off all antihypertensive medications.  He could follow up with his PCP. Patient with history of tobacco abuse group home received counseling regarding cessation of nicotine products.  Physical exam. Blood pressure 175/71 pulse 67 temperature 90.7 respirations 18 oxygen saturation 94% room air Constitutional. Well-nourished and cooperative HEENT. Craniotomy site clean and dry Eyes. Pupils reactive to light no nystagmus without discharge Neck. Supple nontender no JVD no thyromegaly Cardiac. Normal rate and rhythm no friction rub or murmur heard Respiratory. Effort normal no respiratory distress without wheeze GI. Soft no distention nontender without rebound Musculoskeletal no edema Logical. Mild lateral deviation of left eye he was able look up and down followed simple commands he does understand simple words dysarthric moves all fours left  more than right with rales to pain.  Rehab course: During patient's stay in rehab weekly team conferences were held to monitor patient's progress, set goals and discuss barriers to discharge. At admission, patient required Moderate assist to  ambulate 300 feet rolling walker, moderate assist stand pivot transfers, minimal guard sit to stand, supervision supine to sit. Total assist upper body bathing and lower body bathing total assist upper body lower body dressing as well as toilet transfers  He  has had improvement in activity tolerance, balance, postural control as well as ability to compensate for deficits. He/She has had improvement in functional use RUE/LUE  and RLE/LLE as well as improvement in awareness.focused on functional transfers and mobility as members from his group home observed via video conference. Perform bed mobility supine to sit supervision to edge of bed. Perform sit to stand from standing 5 without assistive device. Ambulates 120 feet rolling walker supervision. Participated in 2 bouts of 4 shoes without assistive device. Patient indicated need for bathroom after completing therapies ambulates back to his room supervision level transferred to the bathroom. Able to perform toilet transfers without assistive device with supervision and performed peri-care modified independent. Performed hygiene at sink after toileting with supervision. Full teaching completed plan was discharge back to group home.       Disposition:  Discharged to group home   Diet: dysphagia #2 thin liquids  Special Instructions:  no smoking  Medications at discharge. 1. Tylenol as needed 2. Keppra 500 mg twice a day 3. Protonix 40 mg daily 4. Paxil 20 mg daily 5. Senokot 1 tablet by mouth twice a day 6. Norvasc 5 mg daily  Discharge Instructions    Ambulatory referral to Physical Medicine Rehab   Complete by:  As directed    Moderate complexity follow-up 1-2 weeks traumatic subdural hematoma       Follow-up Information    Kirsteins, Victorino Sparrow, MD Follow up.   Specialty:  Physical Medicine and Rehabilitation Why:  office to call for appointment Contact information: [address] [place] [address] [phone]        Tia Alert, MD Follow up.   Specialty:  Neurosurgery Why:  call for appointment Contact information: [address] [place] [address] [phone]        Dettinger, Elige Radon, MD. Go on 08/05/2018.   Specialties:  Family Medicine, Cardiology Why:  @ 9:55am Contact information: [address] [place] [address] [phone]           Signed: Mcarthur Rossetti Lashon Beringer 08/01/2018, 11:15 AM

## 2018-07-26 NOTE — Progress Notes (Signed)
Occupational Therapy Session Note  Patient Details  Name: Michael Mercado MRN: [id number] Date of Birth: [birth year]  Today's Date: 07/26/2018 OT Individual Time: 1045-1130 OT Individual Time Calculation (min): 45 min    Short Term Goals: Week 1:  OT Short Term Goal 1 (Week 1): STGs=LTGs secondary to estimated short LOS  Skilled Therapeutic Interventions/Progress Updates:    Treatment session with focus on ADL retraining.  Pt agreeable to shower this session.  Pt ambulated to bathroom with Rollator with supervision.  Pt completed toilet transfer with supervision, attempted BM with no results.  Pt ambulated without AD from toilet to shower with supervision.  Pt completed bathing at sit > stand level with min cues for thoroughness. Dressing completed at sit > stand level from EOB.  Pt asked therapist to don hospital socks, however pt able to complete with encouragement.  Pt returned to supine in bed and left with all needs in reach.  Therapy Documentation Precautions:  Precautions Precautions: Fall Precaution Comments: Rt inattention Restrictions Weight Bearing Restrictions: No Pain:  Pt with no c/o pain   Therapy/Group: Individual Therapy  Rosalio Loud 07/26/2018, 11:43 AM

## 2018-07-27 ENCOUNTER — Inpatient Hospital Stay (HOSPITAL_COMMUNITY): Payer: Medicare Other | Admitting: Physical Therapy

## 2018-07-27 ENCOUNTER — Inpatient Hospital Stay (HOSPITAL_COMMUNITY): Payer: Medicare Other

## 2018-07-27 NOTE — Progress Notes (Addendum)
Social Work Discharge Note  The overall goal for the admission was met for:   Discharge location: Yes - return to [place] where pt lived previously  Length of Stay: Yes - 18 days  Discharge activity level: Yes - supervision  Home/community participation: Yes  Services provided included: MD, RD, PT, OT, SLP, RN, Pharmacy and SW  Financial Services: Medicare and Medicaid  Follow-up services arranged: Home Health: PT/OT/ST, DME: rollator; tub transfer bench and Patient/Family request agency HH: [facility], DME: [facility]  Comments (or additional information): Training occurred over video conferencing with PT and OT talked with him over telephone to educate them on pt's need for supervision, keeping pt within arm's reach.  Patient/Family verbalized understanding of follow-up arrangements: Yes  Individual responsible for coordination of the follow-up plan: [name] Caple, QP - [phone] and other group home staff  Confirmed correct DME delivered: Trey Sailors 07/27/2018    Eriq Hufford, Silvestre Mesi

## 2018-07-27 NOTE — Progress Notes (Signed)
Occupational Therapy Discharge Summary  Patient Details  Name: Michael Mercado MRN: [id number] Date of Birth: 1941-04-17  Today's Date: 07/27/2018 OT Individual Time: 1530-1555 OT Individual Time Calculation (min): 25 min    Patient has met 11 of 11 long term goals due to improved activity tolerance, improved balance, ability to compensate for deficits, improved awareness and improved coordination.  Patient to discharge at overall Supervision level.  OT spoke on phone to group home and provided education on what 24/7 supervision means/looks like after discharge. They were not physically able to come to family education secondary to current covid -48 pandemic.  Reasons goals not met: all goals met  Recommendation:  Patient will benefit from ongoing skilled OT services in home health setting to continue to advance functional skills in the area of BADL.  Equipment: TTB  Reasons for discharge: treatment goals met  Patient/family agrees with progress made and goals achieved: Yes   OT Intervention: Upon entering the room, pt supine in bed and agreeable to OT intervention. Pt reports, " stomach upset". Pt performed bed mobility with supervision overall and ambulates with rollator to bathroom.Clothing management and hygiene preformed after BM with supervision and min verbal cuing for safety. Hand hygiene performed at sink and pt finished shaving himself while standing with use of mirror. Pt returning to bed at end of session with bed alarm activated and call bell within reach.   OT Discharge Precautions/Restrictions  Precautions Precautions: Fall Precaution Comments: Rt inattention General PT Missed Treatment Reason: Patient fatigue Vital Signs Therapy Vitals Temp: 98 F (36.7 C) Pulse Rate: (!) 57 Resp: 17 BP: (!) 158/85 Patient Position (if appropriate): Sitting Oxygen Therapy SpO2: 99 % O2 Device: Room Air Pain Pain Assessment Pain Scale: Faces Pain Score: 0-No  pain Faces Pain Scale: No hurt ADL   Vision Baseline Vision/History: Wears glasses Wears Glasses: At all times Patient Visual Report: Diplopia Perception    Praxis   Cognition Overall Cognitive Status: History of cognitive impairments - at baseline Arousal/Alertness: Awake/alert Orientation Level: Oriented to person Sensation Sensation Light Touch: Appears Intact Coordination Gross Motor Movements are Fluid and Coordinated: No Fine Motor Movements are Fluid and Coordinated: No Motor  Motor Motor - Discharge Observations: mild hemiplegia Mobility  Bed Mobility Bed Mobility: Rolling Right;Supine to Sit;Sit to Supine Rolling Right: Supervision/verbal cueing Supine to Sit: Supervision/Verbal cueing Sit to Supine: Supervision/Verbal cueing Transfers Sit to Stand: Supervision/Verbal cueing  Trunk/Postural Assessment  Cervical Assessment Cervical Assessment: Exceptions to WFL(forward head) Thoracic Assessment Thoracic Assessment: Exceptions to WFL(kyphotic) Lumbar Assessment Lumbar Assessment: Exceptions to WFL(posterior pelvic tilt) Postural Control Postural Control: Deficits on evaluation  Balance Balance Balance Assessed: Yes Dynamic Sitting Balance Dynamic Sitting - Balance Support: Feet supported;No upper extremity supported Dynamic Sitting - Level of Assistance: 6: Modified independent (Device/Increase time) Dynamic Standing Balance Dynamic Standing - Balance Support: No upper extremity supported Dynamic Standing - Level of Assistance: 5: Stand by assistance Extremity/Trunk Assessment RUE Assessment RUE Assessment: Within Functional Limits LUE Assessment LUE Assessment: Within Functional Limits   Gypsy Decant 07/27/2018, 4:06 PM

## 2018-07-27 NOTE — Progress Notes (Signed)
Physical Therapy Session Note  Patient Details  Name: Michael Mercado MRN: [id number] Date of Birth: 07-12-1940  Today's Date: 07/27/2018 PT Individual Time: 1030-1125 PT Individual Time Calculation (min): 55 min   Short Term Goals: Week 1:  PT Short Term Goal 1 (Week 1): STG=LTG due to ELOS  Skilled Therapeutic Interventions/Progress Updates:    Pt received seated in recliner in room, agreeable to PT session. No complaints of pain. Pt requesting to use the bathroom. Sit to stand with Supervision and cueing for safe brake management of rollator. Ambulation into bathroom with rollator and Supervision. Pt is Supervision for toilet transfer, setup A for brief and pants management. Ambulation 2 x 200 ft with rollator and Supervision. Static standing balance: rebounder ball toss with min A for balance; ball toss with no UE support and SBA, alt L/R cone taps with min-mod HHA for balance. Attempt to have pt perform standing ball kicks, pt unable to follow cues to perform activity accurately and safely. Pt requests to use bathroom again. Supervision for toilet transfer and clothing management. Pt requests to lay down at end of session. Bed mobility Supervision. Pt left sidelying in bed with needs in reach, bed alarm in place at end of session.  Therapy Documentation Precautions:  Precautions Precautions: Fall Precaution Comments: Rt inattention Restrictions Weight Bearing Restrictions: No    Therapy/Group: Individual Therapy  Peter Congo, PT, DPT  07/27/2018, 12:43 PM

## 2018-07-27 NOTE — Progress Notes (Signed)
Physical Therapy Session Note  Patient Details  Name: Michael Mercado MRN: [id number] Date of Birth: 18-Nov-1940  Today's Date: 07/27/2018 PT Individual Time: 0830-0910 PT Individual Time Calculation (min): 40 min   Short Term Goals: Week 1:  PT Short Term Goal 1 (Week 1): STG=LTG due to ELOS  Skilled Therapeutic Interventions/Progress Updates:    Patient received up in chair, very pleasant and willing to work with PT this morning. Able to perform functional transfers with min guard to S, then gait trained approximately 180f with rollator and S-min guard, cues for straight line navigation. [facility] then able to transfer to mat table with HHA and min guard. Worked on bridges in supine, patient then requested to sit due to stomach pain and then was able to perform LAQs and seated marches with 8# BLEs. Finished session with gait training approximatley 2520fwith HHA/min guard. He was left up in recliner with chair pad alarm active, all needs otherwise met this morning.   Therapy Documentation Precautions:  Precautions Precautions: Fall Precaution Comments: Rt inattention Restrictions Weight Bearing Restrictions: No General:   Vital Signs:   Pain: Pain Assessment Pain Scale: Faces Pain Score: 0-No pain Faces Pain Scale: No hurt    Therapy/Group: Individual Therapy   KrDeniece ReeT, DPT, CBIS  Supplemental Physical Therapist [facility]  Pager [phone] Office [phone]  07/27/2018, 12:33 PM

## 2018-07-27 NOTE — Progress Notes (Signed)
[facility] PHYSICAL MEDICINE & REHABILITATION PROGRESS NOTE   Subjective/Complaints: Discussed with speech therapy, patient at cognitive baseline.  Was modified independent with self-care tasks currently at a supervision level  ROS: Limited due to cognition   Objective:   No results found. Recent Labs    07/25/18 1010  WBC 7.2  HGB 10.9*  HCT 34.7*  PLT 263   Recent Labs    07/25/18 1010  NA 136  K 4.3  CL 103  CO2 26  GLUCOSE 98  BUN 18  CREATININE 1.09  CALCIUM 8.7*    Intake/Output Summary (Last 24 hours) at 07/27/2018 1058 Last data filed at 07/27/2018 0700 Gross per 24 hour  Intake 240 ml  Output 450 ml  Net -210 ml     Physical Exam: Vital Signs Blood pressure 130/63, pulse 60, temperature 98.3 F (36.8 C), temperature source Oral, resp. rate 18, height 5\' 6"  (1.676 m), weight 78.2 kg, SpO2 97 %. Constitutional: NAD.  Vital signs reviewed. HENT: Crani site C/C/I. Eyes: No discharge. Cardiovascular: No JVD    Respiratory: Normal effort    GI: Nondistended. Musculoskeletal: No edema or tenderness in extremities Neurological:  Alert Dysarthria Motor: Limited due to ability follow commands, however moving all extremities, unchanged  He does have antigravity in bilateral upper and lower limbs he does have mild weakness on the right side versus the left Skin: Skin iswarm.  See above Psychiatric:Altered mentation  Assessment/Plan: 1. Functional deficits secondary to TBI which require 3+ hours per day of interdisciplinary therapy in a comprehensive inpatient rehab setting.  Physiatrist is providing close team supervision and 24 hour management of active medical problems listed below.  Physiatrist and rehab team continue to assess barriers to discharge/monitor patient progress toward functional and medical goals  Care Tool:  Bathing  Bathing activity did not occur: Safety/medical concerns Body parts bathed by patient: Right arm, Left arm, Chest,  Abdomen, Front perineal area, Buttocks, Right upper leg, Left upper leg, Right lower leg, Left lower leg, Face   Body parts bathed by helper: Right lower leg, Left lower leg     Bathing assist Assist Level: Supervision/Verbal cueing     Upper Body Dressing/Undressing Upper body dressing   What is the patient wearing?: Pull over shirt    Upper body assist Assist Level: Set up assist    Lower Body Dressing/Undressing Lower body dressing      What is the patient wearing?: Incontinence brief, Pants     Lower body assist Assist for lower body dressing: Supervision/Verbal cueing     Toileting Toileting    Toileting assist Assist for toileting: Supervision/Verbal cueing     Transfers Chair/bed transfer  Transfers assist     Chair/bed transfer assist level: Supervision/Verbal cueing     Locomotion Ambulation   Ambulation assist      Assist level: Supervision/Verbal cueing Assistive device: Rollator Max distance: 150'   Walk 10 feet activity   Assist     Assist level: Supervision/Verbal cueing Assistive device: Rollator   Walk 50 feet activity   Assist    Assist level: Supervision/Verbal cueing Assistive device: Rollator    Walk 150 feet activity   Assist Walk 150 feet activity did not occur: Refused  Assist level: Supervision/Verbal cueing Assistive device: Rollator    Walk 10 feet on uneven surface  activity   Assist Walk 10 feet on uneven surfaces activity did not occur: Refused   Assist level: Supervision/Verbal cueing Assistive device: Hand held assist  Wheelchair     Assist Will patient use wheelchair at discharge?: No Type of Wheelchair: Manual    Wheelchair assist level: Minimal Assistance - Patient > 75% Max wheelchair distance: 75    Wheelchair 50 feet with 2 turns activity    Assist    Wheelchair 50 feet with 2 turns activity did not occur: Safety/medical concerns   Assist Level: Minimal Assistance -  Patient > 75%   Wheelchair 150 feet activity     Assist Wheelchair 150 feet activity did not occur: Safety/medical concerns       Medical Problem List and Plan: 1.Decreased functional mobility with right side weaknesssecondary to traumatic left subdural hematoma. Status post craniotomy evacuation 07/07/2018 followed by reaccumulation of SDH with redo craniotomy 07/13/2018  Continue CIR PT, OT, SLP Will need additional supervision from group home which is available but needs to be arranged.  He will not be at his baseline status upon discharge from rehab 2. Antithrombotics: -DVT/anticoagulation:SCDs -antiplatelet therapy: N/A 3. Pain Management:Tylenol as needed 4. Mood/mild mental retardation with schizophrenia:Patient on Klonopin 0.5 mg daily prior to admission and Paxil 20 mg daily, Risperdal1 mg twice a day. Resume as needed -antipsychotic agents: N/A  -sleep chart  -ritalin trial   -limit neurosedating meds as possible  -resumed Paxil on 4/3  5. Neuropsych: This patienthe has notcapable of making decisions on hisown behalf.  Continue tele-sitter for safety 6. Skin/Wound Care:Routine skin checks 7. Fluids/Electrolytes/Nutrition:  -encourage po  BMP within acceptable range on 4/3, except for glucose 8. Seizure prophylaxis. Keppra 500 mg twice a day 9. Dysphagia. Dysphagia #1 thin liquids.  Advanced to D2 thins, continue to advance as tolerated 10.Tobacco abuse. Counseling 11.  Labile blood pressure resolved   Vitals:   07/26/18 1953 07/27/18 0502  BP: 129/66 130/63  Pulse: 60 60  Resp: 18 18  Temp: 98.6 F (37 C) 98.3 F (36.8 C)  SpO2: 99% 97%    13.  Leukocytosis, resolved  WBCs 11.0 on 4/1, 7K on 4/6  Afebrile  14.  Acute blood loss anemia  Hemoglobin 10.9 on 4/6, stable   15.  Urinary frequency- able to inform staff of BR needs  PVRs unremarkable  UA/culture ordered   LOS: 8 days A FACE TO FACE EVALUATION WAS  PERFORMED  Erick Colacendrew E Kassi Esteve 07/27/2018, 10:58 AM

## 2018-07-27 NOTE — Progress Notes (Addendum)
Social Work Patient ID: Michael Mercado, male   DOB: 03-22-1941, 78 y.o.   MRN: [id number]   CSW met with pt to update him on team conference discussion and to let him know he would be going home as soon as the group home is prepared for him, hopefully today or tomorrow, with extra caregiver to provide him with supervision.  CSW has spoken with pt's QP, [name] Caple, multiple times to make it a smooth transition for pt to return to group home.  Await word from her that extra services are approved.  CSW has arranged HH and ordered DME.  CSW will continue to follow and assist as needed.

## 2018-07-27 NOTE — Discharge Instructions (Signed)
Inpatient Rehab Discharge Instructions  Michael Mercado Discharge date and time: No discharge date for patient encounter.   Activities/Precautions/ Functional Status: Activity: activity as tolerated Diet:  Wound Care: keep wound clean and dry Functional status:  ___ No restrictions     ___ Walk up steps independently ___ 24/7 supervision/assistance   ___ Walk up steps with assistance ___ Intermittent supervision/assistance  ___ Bathe/dress independently ___ Walk with walker     _x__ Bathe/dress with assistance ___ Walk Independently    ___ Shower independently ___ Walk with assistance    ___ Shower with assistance ___ No alcohol     ___ Return to work/school ________  COMMUNITY REFERRALS UPON DISCHARGE:   Home Health:   PT     OT     ST    Agency:  [facility] Phone: [phone] Medical Equipment/Items Ordered:  Rollator; tub transfer bench  Agency/Supplier:  [facility]       Phone:  [phone]  Special Instructions: No smoking   My questions have been answered and I understand these instructions. I will adhere to these goals and the provided educational materials after my discharge from the hospital.  Patient/Caregiver Signature _______________________________ Date __________  Clinician Signature _______________________________________ Date __________  Please bring this form and your medication list with you to all your follow-up doctor's appointments.

## 2018-07-27 NOTE — Patient Care Conference (Cosign Needed)
Inpatient RehabilitationTeam Conference and Plan of Care Update Date: 07/27/2018   Time: 10:45 AM    Patient Name: Michael Mercado      Medical Record Number: [id number]  Date of Birth: 12/05/1940 Sex: Male         Room/Bed: 4W21C/4W21C-01 Payor Info: Payor: MEDICARE / Plan: MEDICARE PART A AND B / Product Type: *No Product type* /    Admitting Diagnosis: L SDH after a fall  crani  re do  Admit Date/Time:  07/19/2018  2:47 PM Admission Comments: No comment available   Primary Diagnosis:  <principal problem not specified> Principal Problem: <principal problem not specified>  Patient Active Problem List   Diagnosis Date Noted  . Urinary frequency   . Acute blood loss anemia   . Leukocytosis   . Labile blood pressure   . Hyperglycemia   . Dysphagia   . Traumatic subdural hematoma (HCC) 07/19/2018  . Subdural hematoma (HCC) 07/07/2018  . Endotracheally intubated 07/07/2018  . Encounter for postanesthesia care 07/07/2018  . CKD (chronic kidney disease), stage II 03/18/2016  . HTN (hypertension), benign 03/18/2015  . COPD (chronic obstructive pulmonary disease) (HCC) 03/18/2015  . Intellectual disability 03/18/2015  . Schizophrenia (HCC) 03/18/2015  . Hyperlipidemia LDL goal <130 03/18/2015    Expected Discharge Date: Expected Discharge Date: 07/27/18(unless group home refuses to take pt back)  Team Members Present: Physician leading conference: Dr. Andrew Kirsteins Social Worker Present: Jenny , LCSW Nurse Present: Victoria Totten, LPN PT Present: Austin Tucker, PT OT Present: Katie Bradsher, OT SLP Present: Madison Cratch, SLP PPS Coordinator present : Melissa Bowie     Current Status/Progress Goal Weekly Team Focus  Medical   baseline cognition , improving swallow fxn  improve functional moblity reduce fall risk  BP controlled, B adn B scontinenet   Bowel/Bladder   contient of bladder and bowels   remain contient of bladder and bowels        Swallow/Nutrition/ Hydration   sup A dys 2 and thin   Supervision - goal met      ADL's   supervision overall with use of rollator and min cuing for safety awareness   supervision overall  functional mobility/trnasfers, self care, balance, R inattention, activity tolerance, d/c planning   Mobility   CGA stairs, supervision transfers, gait with rollator, and bed mobility   CGA to supervision   d/c planning, activity tolerance, transfers, balance.    Communication             Safety/Cognition/ Behavioral Observations            Pain   currently controlled with prn medication   controlled with current prn medcation       Skin   n/a  n/a        Rehab Goals Patient on target to meet rehab goals: Yes Rehab Goals Revised: none *See Care Plan and progress notes for long and short-term goals.     Barriers to Discharge  Current Status/Progress Possible Resolutions Date Resolved   Physician    Medical stability     progressing toward goals  no medical barriers at present      Nursing                  PT                    OT                  SLP                  SW                Discharge Planning/Teaching Needs:  Pt hopes to return to group home where he has lived for several years, but they are in the process of requesting more help to have a 1-on-1 for pt.  Video education occured for PT and OT was able to discuss the supervision needs of pt for self care.   Team Discussion:  Pt with mild to moderate intellectual disability and now with a subdural hematoma.  Dr. Kirsteins asked team if we believed this to be his baseline and from their discussions with group home, they do feel it is.  Pt has his staples out and is showing no aspiration signs and does not have a lot of weakness on his right side.  Pt is continent of bowel and bladder and he complains of headaches at times, but it is relieved with tylenol.  Pt is at goal level at supervision with rollator.  OT feels pt will  likely do better in his own environment.  Pt is supervision for Dysphagia 2 diet with thin liquids via cup, full supervision with meals.  HH ST can advance diet as appropriate.   Revisions to Treatment Plan:  none    Continued Need for Acute Rehabilitation Level of Care: The patient requires daily medical management by a physician with specialized training in physical medicine and rehabilitation for the following conditions: Daily direction of a multidisciplinary physical rehabilitation program to ensure safe treatment while eliciting the highest outcome that is of practical value to the patient.: Yes Daily medical management of patient stability for increased activity during participation in an intensive rehabilitation regime.: Yes Daily analysis of laboratory values and/or radiology reports with any subsequent need for medication adjustment of medical intervention for : Neurological problems;Blood pressure problems   I attest that I was present, lead the team conference, and concur with the assessment and plan of the team.Team conference was held via web/ teleconference due to COVID - 19.   ,  Capps 07/27/2018, 3:32 PM    

## 2018-07-27 NOTE — Progress Notes (Signed)
Physical Therapy Session Note  Patient Details  Name: MISHA ANTONINI MRN: [id number] Date of Birth: 10/05/1940  Today's Date: 07/27/2018 PT Individual Time: 1330-1425 PT Individual Time Calculation (min): 55 min   Short Term Goals: Week 1:  PT Short Term Goal 1 (Week 1): STG=LTG due to ELOS  Skilled Therapeutic Interventions/Progress Updates:   Pt received supine in bed and agreeable to PT. Supine>sit transfer with supervision assist and mild cues.  Sit>stand from bed with supervision assist from PT.  Gait training with RW and supervision assist from PT throughout rehab unit, 151f, 1236f [id number] cues for safety with rollator as well as step height to prevent shuffling.   Nustep reciprocal movement training x 6 minutes with moderate cues for attention to task    Stair management training with supervision assist and min cues for UE placement. Car transfer with supervision assist from PT to SUV height.   Pt returnde to room and reports need for toileting. Supervision assist to transfer. Pt noted to have BM while on toilet. Min assist for pericare. Pt left sitting in reclier with call bell in reach and all needs met.      Therapy Documentation Precautions:  Precautions Precautions: Fall Precaution Comments: Rt inattention Restrictions Weight Bearing Restrictions: No General: PT Amount of Missed Time (min): 20 Minutes PT Missed Treatment Reason: Patient fatigue Vital Signs: Therapy Vitals Temp: 98 F (36.7 C) Pulse Rate: (!) 57 Resp: 17 BP: (!) 158/85 Patient Position (if appropriate): Sitting Oxygen Therapy SpO2: 99 % O2 Device: Room Air Pain: Pain Assessment Pain Scale: Faces Pain Score: 0-No pain Faces Pain Scale: No hurt    Therapy/Group: Individual Therapy  AustLorie Phenix/2020, 3:02 PM

## 2018-07-28 ENCOUNTER — Inpatient Hospital Stay (HOSPITAL_COMMUNITY): Payer: Medicare Other | Admitting: Physical Therapy

## 2018-07-28 ENCOUNTER — Inpatient Hospital Stay (HOSPITAL_COMMUNITY): Payer: Medicare Other | Admitting: Occupational Therapy

## 2018-07-28 LAB — URINE CULTURE: Culture: 50000 — AB

## 2018-07-28 MED ORDER — LEVETIRACETAM 500 MG PO TABS: 500.0000 mg | ORAL_TABLET | Freq: Two times a day (BID) | ORAL | 0 refills | Status: DC

## 2018-07-28 MED ORDER — PAROXETINE HCL 20 MG PO TABS: 20.0000 mg | ORAL_TABLET | Freq: Every day | ORAL | 0 refills | Status: DC

## 2018-07-28 MED ORDER — PANTOPRAZOLE SODIUM 40 MG PO TBEC: 40.0000 mg | DELAYED_RELEASE_TABLET | Freq: Every day | ORAL | 0 refills | Status: DC

## 2018-07-28 NOTE — Progress Notes (Signed)
Physical Therapy Discharge Summary  Patient Details  Name: Michael Mercado MRN: [id number] Date of Birth: May 14, 1940  Today's Date: 07/28/2018      Patient has met 10 of 10 long term goals due to improved activity tolerance, improved balance, improved postural control, increased strength, ability to compensate for deficits, improved awareness and improved coordination.  Patient to discharge at an ambulatory level Supervision.   Patient's care partner is independent to provide the necessary physical assistance at discharge.  Reasons goals not met: All PT goals met   Recommendation:  Patient will benefit from ongoing skilled PT services in home health setting to continue to advance safe functional mobility, address ongoing impairments in balance, strength, endurance, gait, safety, and minimize fall risk.  Equipment: Rollator`  Reasons for discharge: treatment goals met and discharge from hospital  Patient/family agrees with progress made and goals achieved: Yes  PT Discharge Precautions/Restrictions   fall  Vital Signs Therapy Vitals Temp: 98.3 F (36.8 C) Temp Source: Oral Pulse Rate: 60 Resp: 18 BP: (!) 145/71 Patient Position (if appropriate): Lying Oxygen Therapy SpO2: 99 % O2 Device: Room Air  Cognition Overall Cognitive Status: History of cognitive impairments - at baseline Arousal/Alertness: Awake/alert Orientation Level: Oriented to person;Oriented to place;Oriented to situation Sensation Sensation Light Touch: Appears Intact Coordination Gross Motor Movements are Fluid and Coordinated: No Fine Motor Movements are Fluid and Coordinated: No Motor  Motor Motor - Discharge Observations: mild hemiplegia, improved from Eval   Mobility Bed Mobility Bed Mobility: Rolling Right;Supine to Sit;Sit to Supine Rolling Right: Supervision/verbal cueing Supine to Sit: Supervision/Verbal cueing Sit to Supine: Supervision/Verbal cueing Transfers Transfers: Sit to  Stand;Stand Pivot Transfers Sit to Stand: Supervision/Verbal cueing Stand Pivot Transfers: Supervision/Verbal cueing Transfer (Assistive device): Rollator Locomotion  Gait Ambulation: Yes Gait Assistance: Supervision/Verbal cueing Assistive device: Rollator Gait Gait: Yes Gait Pattern: Impaired Gait Pattern: Wide base of support;Shuffle Gait velocity: decreased/self selected  Stairs / Additional Locomotion Stairs: Yes Stairs Assistance: Contact Guard/Touching assist Stair Management Technique: Two rails Height of Stairs: 6 Wheelchair Mobility Wheelchair Mobility: No  Trunk/Postural Assessment  Cervical Assessment Cervical Assessment: Exceptions to WFL(forward head) Thoracic Assessment Thoracic Assessment: Exceptions to WFL(kyphotic) Lumbar Assessment Lumbar Assessment: Exceptions to WFL(posterior pelvic tilt) Postural Control Postural Control: Deficits on evaluation  Balance Balance Balance Assessed: Yes Dynamic Sitting Balance Dynamic Sitting - Balance Support: Feet supported;No upper extremity supported Dynamic Sitting - Level of Assistance: 6: Modified independent (Device/Increase time) Dynamic Standing Balance Dynamic Standing - Balance Support: No upper extremity supported Dynamic Standing - Level of Assistance: 5: Stand by assistance Extremity Assessment      RLE Assessment RLE Assessment: Within Functional Limits LLE Assessment LLE Assessment: Within Functional Limits    Lorie Phenix 07/28/2018, 6:07 AM

## 2018-07-28 NOTE — Progress Notes (Signed)
PHYSICAL MEDICINE & REHABILITATION PROGRESS NOTE   Subjective/Complaints: Discussed with social work, patient has been approved for additional services at the group home.  ROS: Limited due to cognition   Objective:   No results found. No results for input(s): WBC, HGB, HCT, PLT in the last 72 hours. No results for input(s): NA, K, CL, CO2, GLUCOSE, BUN, CREATININE, CALCIUM in the last 72 hours.  Intake/Output Summary (Last 24 hours) at 07/28/2018 1019 Last data filed at 07/28/2018 0422 Gross per 24 hour  Intake 120 ml  Output 300 ml  Net -180 ml     Physical Exam: Vital Signs Blood pressure (!) 145/71, pulse 60, temperature 98.3 F (36.8 C), temperature source Oral, resp. rate 18, height 5\' 6"  (1.676 m), weight 78.2 kg, SpO2 99 %. Constitutional: NAD.  Vital signs reviewed. HENT: Crani site C/C/I. Eyes: No discharge. Cardiovascular: No JVD    Respiratory: Normal effort    GI: Nondistended. Musculoskeletal: No edema or tenderness in extremities Neurological:  Alert Dysarthria Motor: Limited due to ability follow commands, however moving all extremities, unchanged  He does have antigravity in bilateral upper and lower limbs he does have mild weakness on the right side versus the left Skin: Skin iswarm.  See above Psychiatric:Altered mentation  Assessment/Plan: 1. Functional deficits secondary to TBI which require 3+ hours per day of interdisciplinary therapy in a comprehensive inpatient rehab setting.  Physiatrist is providing close team supervision and 24 hour management of active medical problems listed below.  Physiatrist and rehab team continue to assess barriers to discharge/monitor patient progress toward functional and medical goals  Care Tool:  Bathing  Bathing activity did not occur: Safety/medical concerns Body parts bathed by patient: Right arm, Left arm, Chest, Abdomen, Front perineal area, Buttocks, Right upper leg, Left upper leg, Right  lower leg, Left lower leg, Face   Body parts bathed by helper: Right lower leg, Left lower leg     Bathing assist Assist Level: Supervision/Verbal cueing     Upper Body Dressing/Undressing Upper body dressing   What is the patient wearing?: Pull over shirt    Upper body assist Assist Level: Supervision/Verbal cueing    Lower Body Dressing/Undressing Lower body dressing      What is the patient wearing?: Incontinence brief, Pants     Lower body assist Assist for lower body dressing: Supervision/Verbal cueing     Toileting Toileting    Toileting assist Assist for toileting: Supervision/Verbal cueing     Transfers Chair/bed transfer  Transfers assist     Chair/bed transfer assist level: Supervision/Verbal cueing     Locomotion Ambulation   Ambulation assist      Assist level: Supervision/Verbal cueing Assistive device: Rollator Max distance: 150'   Walk 10 feet activity   Assist     Assist level: Supervision/Verbal cueing Assistive device: Rollator   Walk 50 feet activity   Assist    Assist level: Supervision/Verbal cueing Assistive device: Rollator    Walk 150 feet activity   Assist Walk 150 feet activity did not occur: Refused  Assist level: Supervision/Verbal cueing Assistive device: Rollator    Walk 10 feet on uneven surface  activity   Assist Walk 10 feet on uneven surfaces activity did not occur: Refused   Assist level: Supervision/Verbal cueing Assistive device: PhotographerWalker-rolling   Wheelchair     Assist Will patient use wheelchair at discharge?: No Type of Wheelchair: Manual Wheelchair activity did not occur: N/A  Wheelchair assist level: Minimal Assistance - Patient >  75% Max wheelchair distance: 75    Wheelchair 50 feet with 2 turns activity    Assist    Wheelchair 50 feet with 2 turns activity did not occur: N/A   Assist Level: Minimal Assistance - Patient > 75%   Wheelchair 150 feet activity      Assist Wheelchair 150 feet activity did not occur: N/A       Medical Problem List and Plan: 1.Decreased functional mobility with right side weaknesssecondary to traumatic left subdural hematoma. Status post craniotomy evacuation 07/07/2018 followed by reaccumulation of SDH with redo craniotomy 07/13/2018  Continue CIR PT, OT, SLP, plan discharge in a.m. Will need additional supervision from group home which is available but needs to be arranged.  He will not be at his baseline status upon discharge from rehab 2. Antithrombotics: -DVT/anticoagulation:SCDs -antiplatelet therapy: N/A 3. Pain Management:Tylenol as needed 4. Mood/mild mental retardation with schizophrenia:Patient on Klonopin 0.5 mg daily prior to admission and Paxil 20 mg daily, Risperdal1 mg twice a day. Resume as needed -antipsychotic agents: N/A  -sleep chart  -ritalin trial   -limit neurosedating meds as possible  -resumed Paxil on 4/3  5. Neuropsych: This patienthe has notcapable of making decisions on hisown behalf.  Continue tele-sitter for safety 6. Skin/Wound Care:Routine skin checks 7. Fluids/Electrolytes/Nutrition:  -encourage po  BMP within acceptable range on 4/3, except for glucose 8. Seizure prophylaxis. Keppra 500 mg twice a day 9. Dysphagia. Dysphagia #1 thin liquids.  Advanced to D2 thins, continue to advance as tolerated 10.Tobacco abuse. Counseling 11.  Labile blood pressure resolved   Vitals:   07/27/18 2012 07/28/18 0426  BP: 139/60 (!) 145/71  Pulse: (!) 56 60  Resp: 18 18  Temp: 98.3 F (36.8 C) 98.3 F (36.8 C)  SpO2: 99% 99%    13.  Leukocytosis, resolved  WBCs 11.0 on 4/1, 7K on 4/6  Afebrile  14.  Acute blood loss anemia  Hemoglobin 10.9 on 4/6, stable   15.  Urinary frequency- able to inform staff of BR needs  PVRs unremarkable  UA/culture ordered   LOS: 9 days A FACE TO FACE EVALUATION WAS PERFORMED  Erick Colace 07/28/2018, 10:19 AM

## 2018-07-29 NOTE — Progress Notes (Signed)
[facility] PHYSICAL MEDICINE & REHABILITATION PROGRESS NOTE   Subjective/Complaints: Discussed with social work, patient has been approved for additional services at the group home. Asking for help with urinal  ROS: Limited due to cognition   Objective:   No results found. No results for input(s): WBC, HGB, HCT, PLT in the last 72 hours. No results for input(s): NA, K, CL, CO2, GLUCOSE, BUN, CREATININE, CALCIUM in the last 72 hours.  Intake/Output Summary (Last 24 hours) at 07/29/2018 1104 Last data filed at 07/29/2018 0900 Gross per 24 hour  Intake 540 ml  Output 700 ml  Net -160 ml     Physical Exam: Vital Signs Blood pressure (!) 148/74, pulse (!) 51, temperature 98.2 F (36.8 C), temperature source Oral, resp. rate 14, height 5\' 6"  (1.676 m), weight 78.2 kg, SpO2 98 %. Constitutional: NAD.  Vital signs reviewed. HENT: Crani site C/C/I. Eyes: No discharge. Cardiovascular: No JVD    Respiratory: Normal effort    GI: Nondistended. Musculoskeletal: No edema or tenderness in extremities Neurological:  Alert Dysarthria Motor: Limited due to ability follow commands, however moving all extremities, unchanged  He does have antigravity in bilateral upper and lower limbs he does have mild weakness on the right side versus the left Skin: Skin iswarm.  See above Psychiatric:Altered mentation  Assessment/Plan: 1. Functional deficits secondary to TBI which require 3+ hours per day of interdisciplinary therapy in a comprehensive inpatient rehab setting.  Physiatrist is providing close team supervision and 24 hour management of active medical problems listed below.  Physiatrist and rehab team continue to assess barriers to discharge/monitor patient progress toward functional and medical goals  Care Tool:  Bathing  Bathing activity did not occur: Safety/medical concerns Body parts bathed by patient: Right arm, Left arm, Chest, Abdomen, Front perineal area, Buttocks, Right  upper leg, Left upper leg, Right lower leg, Left lower leg, Face   Body parts bathed by helper: Right lower leg, Left lower leg     Bathing assist Assist Level: Supervision/Verbal cueing     Upper Body Dressing/Undressing Upper body dressing   What is the patient wearing?: Pull over shirt    Upper body assist Assist Level: Supervision/Verbal cueing    Lower Body Dressing/Undressing Lower body dressing      What is the patient wearing?: Incontinence brief, Pants     Lower body assist Assist for lower body dressing: Supervision/Verbal cueing     Toileting Toileting    Toileting assist Assist for toileting: Supervision/Verbal cueing     Transfers Chair/bed transfer  Transfers assist     Chair/bed transfer assist level: Supervision/Verbal cueing     Locomotion Ambulation   Ambulation assist      Assist level: Supervision/Verbal cueing Assistive device: Rollator Max distance: 150'   Walk 10 feet activity   Assist     Assist level: Supervision/Verbal cueing Assistive device: Rollator   Walk 50 feet activity   Assist    Assist level: Supervision/Verbal cueing Assistive device: Rollator    Walk 150 feet activity   Assist Walk 150 feet activity did not occur: Refused  Assist level: Supervision/Verbal cueing Assistive device: Rollator    Walk 10 feet on uneven surface  activity   Assist Walk 10 feet on uneven surfaces activity did not occur: Refused   Assist level: Supervision/Verbal cueing Assistive device: PhotographerWalker-rolling   Wheelchair     Assist Will patient use wheelchair at discharge?: No Type of Wheelchair: Manual Wheelchair activity did not occur: N/A  Wheelchair  assist level: Minimal Assistance - Patient > 75% Max wheelchair distance: 75    Wheelchair 50 feet with 2 turns activity    Assist    Wheelchair 50 feet with 2 turns activity did not occur: N/A   Assist Level: Minimal Assistance - Patient > 75%    Wheelchair 150 feet activity     Assist Wheelchair 150 feet activity did not occur: N/A       Medical Problem List and Plan: 1.Decreased functional mobility with right side weaknesssecondary to traumatic left subdural hematoma. Status post craniotomy evacuation 07/07/2018 followed by reaccumulation of SDH with redo craniotomy 07/13/2018  Continue CIR PT, OT, SLP, plan discharge today Will need additional supervision from group home which is available but needs to be arranged.  He will not be at his baseline status upon discharge from rehab 2. Antithrombotics: -DVT/anticoagulation:SCDs -antiplatelet therapy: N/A 3. Pain Management:Tylenol as needed 4. Mood/mild mental retardation with schizophrenia:Patient on Klonopin 0.5 mg daily prior to admission and Paxil 20 mg daily, Risperdal1 mg twice a day. Resume as needed -antipsychotic agents: N/A  -sleep chart  -ritalin trial   -limit neurosedating meds as possible  -resumed Paxil on 4/3  5. Neuropsych: This patienthe has notcapable of making decisions on hisown behalf.   6. Skin/Wound Care:Routine skin checks 7. Fluids/Electrolytes/Nutrition:  -encourage po  BMP within acceptable range on 4/3, except for glucose 8. Seizure prophylaxis. Keppra 500 mg twice a day 9. Dysphagia. Dysphagia #1 thin liquids.  Advanced to D2 thins, continue to advance as tolerated 10.Tobacco abuse. Counseling 11.  Labile blood pressure mild systolic elevation no change in meds for now   Vitals:   07/28/18 1937 07/29/18 0548  BP: (!) 148/74 (!) 148/74  Pulse: 62 (!) 51  Resp: 15 14  Temp: 98.2 F (36.8 C) 98.2 F (36.8 C)  SpO2: 98% 98%    13.  Leukocytosis, resolved  WBCs 11.0 on 4/1, 7K on 4/6  Afebrile  14.  Acute blood loss anemia  Hemoglobin 10.9 on 4/6, stable   15.  Urinary frequency- able to inform staff of BR needs  PVRs unremarkable  UA/culture ordered   LOS: 10 days A FACE TO FACE  EVALUATION WAS PERFORMED  Erick Colace 07/29/2018, 11:04 AM

## 2018-07-29 NOTE — Plan of Care (Signed)
  Problem: RH BLADDER ELIMINATION Goal: RH STG MANAGE BLADDER WITH ASSISTANCE Description STG Manage Bladder With  Min Assistance  Outcome: Progressing

## 2018-07-30 ENCOUNTER — Other Ambulatory Visit: Payer: Self-pay

## 2018-07-30 NOTE — Progress Notes (Addendum)
Michael Mercado is a 78 y.o. male January 13, 1941 [id number]  Subjective: No new complaints. No new problems. Slept well. Feeling OK.  Objective: Vital signs in last 24 hours: Temp:  [97.9 F (36.6 C)-98.2 F (36.8 C)] 98.2 F (36.8 C) (04/11 0455) Pulse Rate:  [50-58] 50 (04/11 0455) Resp:  [14-16] 14 (04/11 0455) BP: (132-152)/(56-68) 137/56 (04/11 0455) SpO2:  [93 %-98 %] 93 % (04/11 0455) Weight change:  Last BM Date: 07/29/18  Intake/Output from previous day: 04/10 0701 - 04/11 0700 In: -  Out: 325 [Urine:325] Last cbgs: CBG (last 3)  No results for input(s): GLUCAP in the last 72 hours.   Physical Exam General: No apparent distress   HEENT: not dry Lungs: Normal effort. Lungs clear to auscultation, no crackles or wheezes. Cardiovascular: Regular rate and rhythm, no edema Abdomen: S/NT/ND; BS(+) Musculoskeletal:  unchanged Neurological: No new neurological deficits Wounds: Well-healed Skin: clear  Aging changes Mental state: Alert, somewhat cooperative    Lab Results: BMET    Component Value Date/Time   NA 136 07/25/2018 1010   NA 137 06/21/2018 0958   K 4.3 07/25/2018 1010   CL 103 07/25/2018 1010   CO2 26 07/25/2018 1010   GLUCOSE 98 07/25/2018 1010   BUN 18 07/25/2018 1010   BUN 19 06/21/2018 0958   CREATININE 1.09 07/25/2018 1010   CALCIUM 8.7 (L) 07/25/2018 1010   GFRNONAA >60 07/25/2018 1010   GFRAA >60 07/25/2018 1010   CBC    Component Value Date/Time   WBC 7.2 07/25/2018 1010   RBC 3.89 (L) 07/25/2018 1010   HGB 10.9 (L) 07/25/2018 1010   HGB 13.5 06/21/2018 0958   HCT 34.7 (L) 07/25/2018 1010   HCT 40.2 06/21/2018 0958   PLT 263 07/25/2018 1010   PLT 206 06/21/2018 0958   MCV 89.2 07/25/2018 1010   MCV 89 06/21/2018 0958   MCH 28.0 07/25/2018 1010   MCHC 31.4 07/25/2018 1010   RDW 14.7 07/25/2018 1010   RDW 13.6 06/21/2018 0958   LYMPHSABS 1.3 07/25/2018 1010   LYMPHSABS 1.4 06/21/2018 0958   MONOABS 0.7 07/25/2018 1010   EOSABS 0.3 07/25/2018 1010   EOSABS 0.2 06/21/2018 0958   BASOSABS 0.0 07/25/2018 1010   BASOSABS 0.0 06/21/2018 0958    Studies/Results: No results found.  Medications: I have reviewed the patient's current medications.  Assessment/Plan:  1.  Right-sided weakness secondary to traumatic left subdural hematoma.  Status post craniotomy and evacuation on 07/07/2018 followed by reaccumulation of subdural hematoma with a redo craniotomy on 07/13/2018.  Continue CIR 2.  DVT prophylaxis with SCDs 3.  Pain management with Tylenol PRN 4.  Mental retardation with schizophrenia.  Continue with Klonopin, Paxil, Risperdal. 5.  Seizure prophylaxis with Keppra twice a day 6.  Dysphagia.  She is on dysphagia #1 thin liquids, advance to D2 thins as tolerated 7.  Tobacco smoking.  Patient was counseled 8.  Labile blood pressure with mild systolic blood pressure elevation, no change in meds 9.  Acute blood loss anemia.  Monitoring hemoglobin 10.  Urinary frequency unchanged     Length of stay, days: 11  Sonda Primes , MD 07/30/2018, 11:12 AM

## 2018-07-31 NOTE — Progress Notes (Addendum)
Michael Mercado is a 78 y.o. male 06-05-1940 [id number]  Subjective: No new complaints. No new problems.   Objective: Vital signs in last 24 hours: Temp:  [97.7 F (36.5 C)-98.2 F (36.8 C)] 98.2 F (36.8 C) (04/12 0504) Pulse Rate:  [51-60] 60 (04/12 0504) Resp:  [15-18] 18 (04/12 0504) BP: (110-158)/(72-83) 158/76 (04/12 0504) SpO2:  [95 %-98 %] 95 % (04/12 0504) Weight change:  Last BM Date: 07/30/18  Intake/Output from previous day: 04/11 0701 - 04/12 0700 In: 760 [P.O.:760] Out: 300 [Urine:300] Last cbgs: CBG (last 3)  No results for input(s): GLUCAP in the last 72 hours.   Physical Exam General: No apparent distress   HEENT: not dry Lungs: Normal effort. Lungs clear to auscultation, no crackles or wheezes. Cardiovascular: Regular rate and rhythm, no edema Abdomen: S/NT/ND; BS(+) Musculoskeletal:  unchanged Neurological: No new neurological deficits Wounds: Healed Skin: clear  Aging changes Mental state: Alert,  cooperative    Lab Results: BMET    Component Value Date/Time   NA 136 07/25/2018 1010   NA 137 06/21/2018 0958   K 4.3 07/25/2018 1010   CL 103 07/25/2018 1010   CO2 26 07/25/2018 1010   GLUCOSE 98 07/25/2018 1010   BUN 18 07/25/2018 1010   BUN 19 06/21/2018 0958   CREATININE 1.09 07/25/2018 1010   CALCIUM 8.7 (L) 07/25/2018 1010   GFRNONAA >60 07/25/2018 1010   GFRAA >60 07/25/2018 1010   CBC    Component Value Date/Time   WBC 7.2 07/25/2018 1010   RBC 3.89 (L) 07/25/2018 1010   HGB 10.9 (L) 07/25/2018 1010   HGB 13.5 06/21/2018 0958   HCT 34.7 (L) 07/25/2018 1010   HCT 40.2 06/21/2018 0958   PLT 263 07/25/2018 1010   PLT 206 06/21/2018 0958   MCV 89.2 07/25/2018 1010   MCV 89 06/21/2018 0958   MCH 28.0 07/25/2018 1010   MCHC 31.4 07/25/2018 1010   RDW 14.7 07/25/2018 1010   RDW 13.6 06/21/2018 0958   LYMPHSABS 1.3 07/25/2018 1010   LYMPHSABS 1.4 06/21/2018 0958   MONOABS 0.7 07/25/2018 1010   EOSABS 0.3 07/25/2018 1010    EOSABS 0.2 06/21/2018 0958   BASOSABS 0.0 07/25/2018 1010   BASOSABS 0.0 06/21/2018 0958    Studies/Results: No results found.  Medications: I have reviewed the patient's current medications.  Assessment/Plan:  1.  Right-sided weakness secondary to traumatic left subdural hematoma.  Status post craniotomy and evacuation on 07/07/2018 followed by reaccumulation of subdural hematoma with the redo craniotomy on 07/13/2018.  Continue CIR 2.  DVT prophylaxis with SCDs 3.  Pain management with Tylenol PRN 4.  Mental retardation with schizophrenia.  Continue with Klonopin, Paxil, Risperdal 5.  Seizure prophylaxis with Keppra twice a day 6.  Dysphagia.  He is on dysphagia 1 thin liquids advance to D2 thins as tolerated 7.  Tobacco smoking. 8.  Labile blood pressure with mild systolic blood pressure elevation, no change in meds 9.  Acute blood loss anemia.  Monitoring hemoglobin 10.  Urinary frequency unchanged     Length of stay, days: 12  Sonda Primes , MD 07/31/2018, 1:19 PM

## 2018-08-01 MED ORDER — AMLODIPINE BESYLATE 5 MG PO TABS
5.0000 mg | ORAL_TABLET | Freq: Every day | ORAL | Status: DC
  Administered 2018-08-01 – 2018-08-06 (×6): 5 mg via ORAL
  Filled 2018-08-01 (×6): qty 1

## 2018-08-01 NOTE — Progress Notes (Signed)
[place] PHYSICAL MEDICINE & REHABILITATION PROGRESS NOTE   Subjective/Complaints:  Pt did not get transport back to group home yet  ROS: Limited due to cognition   Objective:   No results found. No results for input(s): WBC, HGB, HCT, PLT in the last 72 hours. No results for input(s): NA, K, CL, CO2, GLUCOSE, BUN, CREATININE, CALCIUM in the last 72 hours.  Intake/Output Summary (Last 24 hours) at 08/01/2018 0903 Last data filed at 08/01/2018 0700 Gross per 24 hour  Intake 680 ml  Output 650 ml  Net 30 ml     Physical Exam: Vital Signs Blood pressure (!) 143/75, pulse 60, temperature 98 F (36.7 C), temperature source Oral, resp. rate 18, height 5\' 6"  (1.676 m), weight 78.2 kg, SpO2 98 %. Constitutional: NAD.  Vital signs reviewed. HENT: Crani site C/C/I. Eyes: No discharge. Cardiovascular: No JVD    Respiratory: Normal effort    GI: Nondistended. Musculoskeletal: No edema or tenderness in extremities Neurological:  Alert Dysarthria Motor: Limited due to ability follow commands, however moving all extremities, unchanged  He does have antigravity in bilateral upper and lower limbs he does have mild weakness on the right side versus the left Skin: Skin iswarm.  See above Psychiatric:Altered mentation  Assessment/Plan: 1. Functional deficits secondary to TBI which require 3+ hours per day of interdisciplinary therapy in a comprehensive inpatient rehab setting.  Physiatrist is providing close team supervision and 24 hour management of active medical problems listed below.  Physiatrist and rehab team continue to assess barriers to discharge/monitor patient progress toward functional and medical goals  Care Tool:  Bathing  Bathing activity did not occur: Safety/medical concerns Body parts bathed by patient: Right arm, Left arm, Chest, Abdomen, Front perineal area, Buttocks, Right upper leg, Left upper leg, Right lower leg, Left lower leg, Face   Body parts bathed  by helper: Right lower leg, Left lower leg     Bathing assist Assist Level: Supervision/Verbal cueing     Upper Body Dressing/Undressing Upper body dressing   What is the patient wearing?: Pull over shirt    Upper body assist Assist Level: Supervision/Verbal cueing    Lower Body Dressing/Undressing Lower body dressing      What is the patient wearing?: Incontinence brief, Pants     Lower body assist Assist for lower body dressing: Supervision/Verbal cueing     Toileting Toileting    Toileting assist Assist for toileting: Supervision/Verbal cueing     Transfers Chair/bed transfer  Transfers assist     Chair/bed transfer assist level: Supervision/Verbal cueing     Locomotion Ambulation   Ambulation assist      Assist level: Supervision/Verbal cueing Assistive device: Rollator Max distance: 150'   Walk 10 feet activity   Assist     Assist level: Supervision/Verbal cueing Assistive device: Rollator   Walk 50 feet activity   Assist    Assist level: Supervision/Verbal cueing Assistive device: Rollator    Walk 150 feet activity   Assist Walk 150 feet activity did not occur: Refused  Assist level: Supervision/Verbal cueing Assistive device: Rollator    Walk 10 feet on uneven surface  activity   Assist Walk 10 feet on uneven surfaces activity did not occur: Refused   Assist level: Supervision/Verbal cueing Assistive device: Photographer Will patient use wheelchair at discharge?: No Type of Wheelchair: Manual Wheelchair activity did not occur: N/A  Wheelchair assist level: Minimal Assistance - Patient > 75% Max wheelchair  distance: 75    Wheelchair 50 feet with 2 turns activity    Assist    Wheelchair 50 feet with 2 turns activity did not occur: N/A   Assist Level: Minimal Assistance - Patient > 75%   Wheelchair 150 feet activity     Assist Wheelchair 150 feet activity did not occur:  N/A       Medical Problem List and Plan: 1.Decreased functional mobility with right side weaknesssecondary to traumatic left subdural hematoma. Status post craniotomy evacuation 07/07/2018 followed by reaccumulation of SDH with redo craniotomy 07/13/2018  Continue CIR PT, OT, SLP, 2. Antithrombotics: -DVT/anticoagulation:SCDs -antiplatelet therapy: N/A 3. Pain Management:Tylenol as needed 4. Mood/mild mental retardation with schizophrenia:Patient on Klonopin 0.5 mg daily prior to admission and Paxil 20 mg daily, Risperdal1 mg twice a day. Resume as needed -antipsychotic agents: N/A  -sleep chart  -ritalin trial   -limit neurosedating meds as possible  -resumed Paxil on 4/3  5. Neuropsych: This patienthe has notcapable of making decisions on hisown behalf.   6. Skin/Wound Care:Routine skin checks 7. Fluids/Electrolytes/Nutrition:  -encourage po  BMP within acceptable range on 4/3, except for glucose 8. Seizure prophylaxis. Keppra 500 mg twice a day 9. Dysphagia. Dysphagia #1 thin liquids.  Advanced to D2 thins, continue to advance as tolerated 10.Tobacco abuse. Counseling HTN - resume norvasc  Vitals:   07/31/18 1927 08/01/18 0341  BP: (!) 161/70 (!) 143/75  Pulse: 60 60  Resp: 19 18  Temp: 98.3 F (36.8 C) 98 F (36.7 C)  SpO2: 99% 98%    13.  Leukocytosis, resolved  WBCs 11.0 on 4/1, 7K on 4/6  Afebrile  14.  Acute blood loss anemia  Hemoglobin 10.9 on 4/6, stable   15.  Urinary frequency- able to inform staff of BR needs  PVRs unremarkable  UA/culture ordered   LOS: 13 days A FACE TO FACE EVALUATION WAS PERFORMED  Erick Colacendrew E Kirsteins 08/01/2018, 9:03 AM

## 2018-08-01 NOTE — Plan of Care (Signed)
  Problem: Consults Goal: RH BRAIN INJURY PATIENT EDUCATION Description Description: See Patient Education module for eduction specifics Outcome: Progressing   Problem: RH SAFETY Goal: RH STG ADHERE TO SAFETY PRECAUTIONS W/ASSISTANCE/DEVICE Description STG Adhere to Safety Precautions With min Assistance/Device.  Outcome: Progressing Flowsheets (Taken 08/01/2018 1429) STG:Pt will adhere to safety precautions with assistance/device: 5-Supervision/cueing   Problem: RH KNOWLEDGE DEFICIT BRAIN INJURY Goal: RH STG INCREASE KNOWLEDGE OF SELF CARE AFTER BRAIN INJURY Description Patient will be able to manage self care after D/C. Cues & Supervision for meds  Outcome: Progressing Goal: RH STG INCREASE KNOWLEDGE OF DYSPHAGIA/FLUID INTAKE Description Patient will demonstrate dietary limits due to dysphagia.   Outcome: Progressing

## 2018-08-01 NOTE — Progress Notes (Addendum)
Social Work Patient ID: Michael Mercado, male   DOB: 30-Mar-1941, 78 y.o.   MRN: [id number]   CSW continues to work with group home, [name], to discuss barriers to pt returning home.  Group Home continues to await written authorization to hire extra care for pt so that he will have 24/7 supervision.  They will not accept him back until this is received.  They had to submit information from the group home's accountant in order to receive the written authorization and CSW has learned today, that this additional information needs to be submitted by the group home before [employer] [facility]) can provide written authorization.  CSW has made multiple phone calls and written numerous texts/emails to stay on top of this situation.  Cheri Guppy, Director of Nursing, is aware of the situation, as are MD and PA and other team members.  At this point, there is nothing the hospital staff can do until this written authorization is received, as group home director, Lattie Haw, will not accept pt back.    His DME was ordered and delivered last week and HH was arranged at that time, as well, in anticipation for d/c on 07-27-18.  CSW will continue to work with group home staff to help facilitate pt's return home.

## 2018-08-02 LAB — BASIC METABOLIC PANEL
Anion gap: 8 (ref 5–15)
BUN: 16 mg/dL (ref 8–23)
CO2: 26 mmol/L (ref 22–32)
Calcium: 9.1 mg/dL (ref 8.9–10.3)
Chloride: 105 mmol/L (ref 98–111)
Creatinine, Ser: 1.16 mg/dL (ref 0.61–1.24)
GFR calc Af Amer: >60 mL/min (ref >60)
GFR calc non Af Amer: >60 mL/min (ref >60)
Glucose, Bld: 94 mg/dL (ref 70–99)
Potassium: 4.3 mmol/L (ref 3.5–5.1)
Sodium: 139 mmol/L (ref 135–145)

## 2018-08-02 NOTE — Plan of Care (Signed)
  Problem: Consults Goal: RH BRAIN INJURY PATIENT EDUCATION Description Description: See Patient Education module for eduction specifics Outcome: Progressing   Problem: RH SAFETY Goal: RH STG ADHERE TO SAFETY PRECAUTIONS W/ASSISTANCE/DEVICE Description STG Adhere to Safety Precautions With min Assistance/Device.  Outcome: Progressing   Problem: RH KNOWLEDGE DEFICIT BRAIN INJURY Goal: RH STG INCREASE KNOWLEDGE OF SELF CARE AFTER BRAIN INJURY Description Patient will be able to manage self care after D/C. Cues & Supervision for meds  Outcome: Progressing Goal: RH STG INCREASE KNOWLEDGE OF DYSPHAGIA/FLUID INTAKE Description Patient will demonstrate dietary limits due to dysphagia.   Outcome: Progressing   

## 2018-08-02 NOTE — Progress Notes (Signed)
Social Work Patient ID: Michael Mercado, male   DOB: 1940/08/04, 78 y.o.   MRN: [id number]   CSW spoke with [facility], [name], and [employer] is processing the group home's request for additional support.  Once the group home receives their written approval, group home is prepared to come get pt.  CSW told pt that we are working hard to get him home and hopefully by tomorrow, he will be home.  He said "ok" and asked for help to the bathroom.  CSW asked nurse to help him.  Nurse and PA aware of the above.  D/C paperwork is in pt's soft chart at the nurses' station just in case group home comes to get him tonight, although this is doubtful at the time of this writing.  CSW also tried to call Care Coordinator at [facility] to see if she could assist at all and had to leave a message. Await written approval so that group home can come to get pt.

## 2018-08-02 NOTE — Progress Notes (Signed)
[place] PHYSICAL MEDICINE & REHABILITATION PROGRESS NOTE   Subjective/Complaints:  Awaiting bed at group home  ROS: Limited due to cognition   Objective:   No results found. No results for input(s): WBC, HGB, HCT, PLT in the last 72 hours. No results for input(s): NA, K, CL, CO2, GLUCOSE, BUN, CREATININE, CALCIUM in the last 72 hours.  Intake/Output Summary (Last 24 hours) at 08/02/2018 0752 Last data filed at 08/02/2018 0647 Gross per 24 hour  Intake 200 ml  Output 150 ml  Net 50 ml     Physical Exam: Vital Signs Blood pressure 138/86, pulse 60, temperature 98 F (36.7 C), temperature source Oral, resp. rate 18, height 5\' 6"  (1.676 m), weight 78.2 kg, SpO2 99 %. Constitutional: NAD.  Vital signs reviewed. HENT: Crani site C/C/I. Eyes: No discharge. Cardiovascular: No JVD    Respiratory: Normal effort    GI: Nondistended. Musculoskeletal: No edema or tenderness in extremities Neurological:  Alert Dysarthria Motor: Limited due to ability follow commands, however moving all extremities, unchanged  He does have antigravity in bilateral upper and lower limbs he does have mild weakness on the right side versus the left Skin: Skin iswarm.  See above Psychiatric:Altered mentation  Assessment/Plan: 1. Functional deficits secondary to TBI which require 3+ hours per day of interdisciplinary therapy in a comprehensive inpatient rehab setting.  Physiatrist is providing close team supervision and 24 hour management of active medical problems listed below.  Physiatrist and rehab team continue to assess barriers to discharge/monitor patient progress toward functional and medical goals  Care Tool:  Bathing  Bathing activity did not occur: Safety/medical concerns Body parts bathed by patient: Right arm, Left arm, Chest, Abdomen, Front perineal area, Buttocks, Right upper leg, Left upper leg, Right lower leg, Left lower leg, Face   Body parts bathed by helper: Right lower  leg, Left lower leg     Bathing assist Assist Level: Supervision/Verbal cueing     Upper Body Dressing/Undressing Upper body dressing   What is the patient wearing?: Pull over shirt    Upper body assist Assist Level: Supervision/Verbal cueing    Lower Body Dressing/Undressing Lower body dressing      What is the patient wearing?: Incontinence brief, Pants     Lower body assist Assist for lower body dressing: Supervision/Verbal cueing     Toileting Toileting    Toileting assist Assist for toileting: Supervision/Verbal cueing     Transfers Chair/bed transfer  Transfers assist     Chair/bed transfer assist level: Supervision/Verbal cueing     Locomotion Ambulation   Ambulation assist      Assist level: Supervision/Verbal cueing Assistive device: Rollator Max distance: 150'   Walk 10 feet activity   Assist     Assist level: Supervision/Verbal cueing Assistive device: Rollator   Walk 50 feet activity   Assist    Assist level: Supervision/Verbal cueing Assistive device: Rollator    Walk 150 feet activity   Assist Walk 150 feet activity did not occur: Refused  Assist level: Supervision/Verbal cueing Assistive device: Rollator    Walk 10 feet on uneven surface  activity   Assist Walk 10 feet on uneven surfaces activity did not occur: Refused   Assist level: Supervision/Verbal cueing Assistive device: Photographer Will patient use wheelchair at discharge?: No Type of Wheelchair: Manual Wheelchair activity did not occur: N/A  Wheelchair assist level: Minimal Assistance - Patient > 75% Max wheelchair distance: 75    Wheelchair  50 feet with 2 turns activity    Assist    Wheelchair 50 feet with 2 turns activity did not occur: N/A   Assist Level: Minimal Assistance - Patient > 75%   Wheelchair 150 feet activity     Assist Wheelchair 150 feet activity did not occur: N/A       Medical  Problem List and Plan: 1.Decreased functional mobility with right side weaknesssecondary to traumatic left subdural hematoma. Status post craniotomy evacuation 07/07/2018 followed by reaccumulation of SDH with redo craniotomy 07/13/2018  Has been d/ced from therapy    2. Antithrombotics: -DVT/anticoagulation:SCDs -antiplatelet therapy: N/A 3. Pain Management:Tylenol as needed 4. Mood/mild mental retardation with schizophrenia:Patient on Klonopin 0.5 mg daily prior to admission and Paxil 20 mg daily, Risperdal1 mg twice a day. Resume as needed -antipsychotic agents: N/A  -sleep chart  -ritalin trial   -limit neurosedating meds as possible  -resumed Paxil on 4/3  5. Neuropsych: This patienthe has notcapable of making decisions on hisown behalf.   6. Skin/Wound Care:Routine skin checks 7. Fluids/Electrolytes/Nutrition:  -encourage po  BMP within acceptable range on 4/3, except for glucose 8. Seizure prophylaxis. Keppra 500 mg twice a day 9. Dysphagia. Advanced to D2 thins, continue to advance as tolerated , intake only 200ml yesterday recheck BMET 10.Tobacco abuse. Counseling HTN - resumed norvasc On 4/13 Vitals:   08/01/18 1923 08/02/18 0505  BP: (!) 145/62 138/86  Pulse: 60 60  Resp: 18 18  Temp: 98.3 F (36.8 C) 98 F (36.7 C)  SpO2: 98% 99%      14.  Acute blood loss anemia  Hemoglobin 10.9 on 4/6, stable   15.  Urinary frequency- able to inform staff of BR needs  PVRs unremarkable  UA neg , cx showing colonization no tx, afebrile   LOS: 14 days A FACE TO FACE EVALUATION WAS PERFORMED  Erick Colacendrew E  08/02/2018, 7:52 AM

## 2018-08-03 NOTE — Progress Notes (Signed)
Social Work Patient ID: Michael Mercado, male   DOB: 09-08-1940, 78 y.o.   MRN: [id number]   CSW met with pt to tell him that we just await group home receiving written approval that he will have additional care through a 1-on-1 and then they will come get him to go home.  He said "ok".  CSW continues to keep in touch with group home Q[facility], Administrator, sports, and [facility]ack [facility] Social Work.  Edwyna Ready will assist as needed and CSW awaits his return call at the time of this writing.  CSW will continue to follow to facilitate pt's return to group home and CIR is ready on their end for this to happen as soon as group home receives the written approval they await.

## 2018-08-03 NOTE — Progress Notes (Signed)
[place] PHYSICAL MEDICINE & REHABILITATION PROGRESS NOTE   Subjective/Complaints:  Awaiting bed at group home  ROS: Limited due to cognition   Objective:   No results found. No results for input(s): WBC, HGB, HCT, PLT in the last 72 hours. Recent Labs    08/02/18 1054  NA 139  K 4.3  CL 105  CO2 26  GLUCOSE 94  BUN 16  CREATININE 1.16  CALCIUM 9.1    Intake/Output Summary (Last 24 hours) at 08/03/2018 1015 Last data filed at 08/03/2018 0851 Gross per 24 hour  Intake 360 ml  Output 700 ml  Net -340 ml     Physical Exam: Vital Signs Blood pressure 134/76, pulse (!) 47, temperature 98.2 F (36.8 C), resp. rate 18, height 5\' 6"  (1.676 m), weight 78.2 kg, SpO2 98 %. Constitutional: NAD.  Vital signs reviewed. HENT: Crani site C/C/I. Eyes: No discharge. Cardiovascular: No JVD    Respiratory: Normal effort    GI: Nondistended. Musculoskeletal: No edema or tenderness in extremities Neurological:  Alert Dysarthria Motor: Limited due to ability follow commands, however moving all extremities, unchanged  He does have antigravity in bilateral upper and lower limbs he does have mild weakness on the right side versus the left Skin: Skin iswarm.  See above Psychiatric:Altered mentation  Assessment/Plan: 1. Functional deficits secondary to TBI which require 3+ hours per day of interdisciplinary therapy in a comprehensive inpatient rehab setting.  Physiatrist is providing close team supervision and 24 hour management of active medical problems listed below.  Physiatrist and rehab team continue to assess barriers to discharge/monitor patient progress toward functional and medical goals  Care Tool:  Bathing  Bathing activity did not occur: Safety/medical concerns Body parts bathed by patient: Right arm, Left arm, Chest, Abdomen, Front perineal area, Buttocks, Right upper leg, Left upper leg, Right lower leg, Left lower leg, Face   Body parts bathed by helper: Right  lower leg, Left lower leg     Bathing assist Assist Level: Supervision/Verbal cueing     Upper Body Dressing/Undressing Upper body dressing   What is the patient wearing?: Pull over shirt    Upper body assist Assist Level: Supervision/Verbal cueing    Lower Body Dressing/Undressing Lower body dressing      What is the patient wearing?: Incontinence brief, Pants     Lower body assist Assist for lower body dressing: Supervision/Verbal cueing     Toileting Toileting    Toileting assist Assist for toileting: Supervision/Verbal cueing     Transfers Chair/bed transfer  Transfers assist     Chair/bed transfer assist level: Supervision/Verbal cueing     Locomotion Ambulation   Ambulation assist      Assist level: Supervision/Verbal cueing Assistive device: Rollator Max distance: 150'   Walk 10 feet activity   Assist     Assist level: Supervision/Verbal cueing Assistive device: Rollator   Walk 50 feet activity   Assist    Assist level: Supervision/Verbal cueing Assistive device: Rollator    Walk 150 feet activity   Assist Walk 150 feet activity did not occur: Refused  Assist level: Supervision/Verbal cueing Assistive device: Rollator    Walk 10 feet on uneven surface  activity   Assist Walk 10 feet on uneven surfaces activity did not occur: Refused   Assist level: Supervision/Verbal cueing Assistive device: Photographer Will patient use wheelchair at discharge?: No Type of Wheelchair: Manual Wheelchair activity did not occur: N/A  Wheelchair assist level:  Minimal Assistance - Patient > 75% Max wheelchair distance: 75    Wheelchair 50 feet with 2 turns activity    Assist    Wheelchair 50 feet with 2 turns activity did not occur: N/A   Assist Level: Minimal Assistance - Patient > 75%   Wheelchair 150 feet activity     Assist Wheelchair 150 feet activity did not occur: N/A        Medical Problem List and Plan: 1.Decreased functional mobility with right side weaknesssecondary to traumatic left subdural hematoma. Status post craniotomy evacuation 07/07/2018 followed by reaccumulation of SDH with redo craniotomy 07/13/2018  Has been d/ced from therapy  Medically stable for d/c to group home   2. Antithrombotics: -DVT/anticoagulation:SCDs -antiplatelet therapy: N/A 3. Pain Management:Tylenol as needed 4. Mood/mild mental retardation with schizophrenia:Patient on Klonopin 0.5 mg daily prior to admission and Paxil 20 mg daily, Risperdal1 mg twice a day. Resume as needed -antipsychotic agents: N/A  -sleep chart  -ritalin trial   -limit neurosedating meds as possible  -resumed Paxil on 4/3  5. Neuropsych: This patienthe has notcapable of making decisions on hisown behalf.   6. Skin/Wound Care:Routine skin checks 7. Fluids/Electrolytes/Nutrition:  -encourage po  BMP within acceptable range on 4/3, except for glucose 8. Seizure prophylaxis. Keppra 500 mg twice a day 9. Dysphagia. Advanced to D2 thins, continue to advance as tolerated , intake only 200ml yesterday recheck BMET 10.Tobacco abuse. Counseling HTN - normotensive Vitals:   08/02/18 1935 08/03/18 0603  BP: (!) 117/54 134/76  Pulse: (!) 52 (!) 47  Resp: 17 18  Temp: 97.8 F (36.6 C) 98.2 F (36.8 C)  SpO2: 99% 98%      14.  Acute blood loss anemia  Hemoglobin 10.9 on 4/6, stable   15.  Urinary frequency- able to inform staff of BR needs  PVRs unremarkable  UA neg , cx showing colonization no tx, afebrile   LOS: 15 days A FACE TO FACE EVALUATION WAS PERFORMED  Michael Mercado E Kirsteins 08/03/2018, 10:15 AM

## 2018-08-03 NOTE — Patient Care Conference (Signed)
Inpatient RehabilitationTeam Conference and Plan of Care Update Date: 08/03/2018   Time: 1:36 PM    Patient Name: Michael Mercado      Medical Record Number: [id number]  Date of Birth: [birth year] Sex: Male         Room/Bed: 4W21C/4W21C-01 Payor Info: Payor: MEDICARE / Plan: MEDICARE PART A AND B / Product Type: *No Product type* /    Admitting Diagnosis: L SDH after a fall  crani  re do  Admit Date/Time:  07/19/2018  2:47 PM Admission Comments: No comment available   Primary Diagnosis:  <principal problem not specified> Principal Problem: <principal problem not specified>  Patient Active Problem List   Diagnosis Date Noted  . Urinary frequency   . Acute blood loss anemia   . Leukocytosis   . Labile blood pressure   . Hyperglycemia   . Dysphagia   . Traumatic subdural hematoma ([place]) 07/19/2018  . Subdural hematoma ([place]) 07/07/2018  . Endotracheally intubated 07/07/2018  . Encounter for postanesthesia care 07/07/2018  . CKD (chronic kidney disease), stage II 03/18/2016  . HTN (hypertension), benign 03/18/2015  . COPD (chronic obstructive pulmonary disease) ([place]) 03/18/2015  . Intellectual disability 03/18/2015  . Schizophrenia ([place]) 03/18/2015  . Hyperlipidemia LDL goal <130 03/18/2015    Expected Discharge Date: Expected Discharge Date: (D/C when [place] has written approval for extra care needs)  Team Members Present: Physician leading conference: Dr. Alysia Penna Social Worker Present: Alfonse Alpers, LCSW Nurse Present: Other (comment)(Jamie Zenia Resides, RN) PT Present: Barrie Folk, PT;Rosita Dechalus, PTA OT Present: Darleen Crocker, OT SLP Present: Stormy Fabian, SLP PPS Coordinator present : Gunnar Fusi     Current Status/Progress Goal Weekly Team Focus  Medical   baseline cognition, improving swallow fxn  improve functional mobility reduce fall risk  BP controlled, B and B continent   Bowel/Bladder   continent of b/b; LBM: 04/14  remain continent of b/b   assist with tolieting needs prn    Swallow/Nutrition/ Hydration             ADL's   supervision overall - goal met         Mobility   CGA to supervision goal met         Communication             Safety/Cognition/ Behavioral Observations            Pain   no c/o pain   remain pain free  assess pain q shift and prn    Skin   skin tear to right arm; ecchymosis   remain free of new infection/breakdown; promote proper wound healing   assess q shift and prn     Rehab Goals Patient on target to meet rehab goals: Yes Rehab Goals Revised: Pt has met goals.   *See Care Plan and progress notes for long and short-term goals.     Barriers to Discharge  Current Status/Progress Possible Resolutions Date Resolved   Physician                    Nursing                  PT                    OT                  SLP  SW                Discharge Planning/Teaching Needs:  Group home has stated that they will take pt back once they have written approval for the extra 1-on-1 care for pt.  Video/phone education was completed.   Team Discussion:  Pt is medically stable and ready for return to group home per Dr. Letta Pate.  Pt has no nursing needs.  Pt has been done with therapy program since last week, so no new therapy progress to report.  Await group home to come and get pt once they receive the written approval that pt will be afforded 1-on-1 supervision level care at group home.  Revisions to Treatment Plan:  none    Continued Need for Acute Rehabilitation Level of Care: The patient requires daily medical management by a physician with specialized training in physical medicine and rehabilitation for the following conditions: Daily direction of a multidisciplinary physical rehabilitation program to ensure safe treatment while eliciting the highest outcome that is of practical value to the patient.: Yes Daily medical management of patient stability for increased  activity during participation in an intensive rehabilitation regime.: Yes Daily analysis of laboratory values and/or radiology reports with any subsequent need for medication adjustment of medical intervention for : Neurological problems;Blood pressure problems   I attest that I was present, lead the team conference, and concur with the assessment and plan of the team. Team conference was held via web/ teleconference due to [place] - 19.   Anicka Stuckert, Silvestre Mesi 08/03/2018, 1:36 PM

## 2018-08-04 NOTE — Progress Notes (Signed)
[facility] PHYSICAL MEDICINE & REHABILITATION PROGRESS NOTE   Subjective/Complaints:  Awaiting bed at group home  ROS: Limited due to cognition   Objective:   No results found. No results for input(s): WBC, HGB, HCT, PLT in the last 72 hours. Recent Labs    08/02/18 1054  NA 139  K 4.3  CL 105  CO2 26  GLUCOSE 94  BUN 16  CREATININE 1.16  CALCIUM 9.1    Intake/Output Summary (Last 24 hours) at 08/04/2018 1034 Last data filed at 08/04/2018 0700 Gross per 24 hour  Intake 590 ml  Output 1016 ml  Net -426 ml     Physical Exam: Vital Signs Blood pressure (!) 144/74, pulse (!) 48, temperature 97.9 F (36.6 C), resp. rate 18, height 5\' 6"  (1.676 m), weight 78.2 kg, SpO2 94 %. Constitutional: NAD.  Vital signs reviewed. HENT: Crani site C/C/I. Eyes: No discharge. Cardiovascular: No JVD    Respiratory: Normal effort    GI: Nondistended. Musculoskeletal: No edema or tenderness in extremities Neurological:  Alert Dysarthria Motor: Limited due to ability follow commands, however moving all extremities, unchanged  He does have antigravity in bilateral upper and lower limbs he does have mild weakness on the right side versus the left Skin: Skin iswarm.  See above Psychiatric:Altered mentation  Assessment/Plan: 1. Functional deficits secondary to TBI which require 3+ hours per day of interdisciplinary therapy in a comprehensive inpatient rehab setting.  Physiatrist is providing close team supervision and 24 hour management of active medical problems listed below.  Physiatrist and rehab team continue to assess barriers to discharge/monitor patient progress toward functional and medical goals  Care Tool:  Bathing  Bathing activity did not occur: Safety/medical concerns Body parts bathed by patient: Right arm, Left arm, Chest, Abdomen, Front perineal area, Buttocks, Right upper leg, Left upper leg, Right lower leg, Left lower leg, Face   Body parts bathed by helper:  Right lower leg, Left lower leg     Bathing assist Assist Level: Supervision/Verbal cueing     Upper Body Dressing/Undressing Upper body dressing   What is the patient wearing?: Pull over shirt    Upper body assist Assist Level: Supervision/Verbal cueing    Lower Body Dressing/Undressing Lower body dressing      What is the patient wearing?: Incontinence brief, Pants     Lower body assist Assist for lower body dressing: Supervision/Verbal cueing     Toileting Toileting    Toileting assist Assist for toileting: Supervision/Verbal cueing     Transfers Chair/bed transfer  Transfers assist     Chair/bed transfer assist level: Supervision/Verbal cueing     Locomotion Ambulation   Ambulation assist      Assist level: Supervision/Verbal cueing Assistive device: Walker-rolling Max distance: 34ft   Walk 10 feet activity   Assist     Assist level: Supervision/Verbal cueing Assistive device: Rollator   Walk 50 feet activity   Assist    Assist level: Supervision/Verbal cueing Assistive device: Walker-rolling    Walk 150 feet activity   Assist Walk 150 feet activity did not occur: Refused  Assist level: Supervision/Verbal cueing Assistive device: Walker-rolling    Walk 10 feet on uneven surface  activity   Assist Walk 10 feet on uneven surfaces activity did not occur: Refused   Assist level: Supervision/Verbal cueing Assistive device: Photographer Will patient use wheelchair at discharge?: No Type of Wheelchair: Manual Wheelchair activity did not occur: N/A  Wheelchair assist  level: Minimal Assistance - Patient > 75% Max wheelchair distance: 75    Wheelchair 50 feet with 2 turns activity    Assist    Wheelchair 50 feet with 2 turns activity did not occur: N/A   Assist Level: Minimal Assistance - Patient > 75%   Wheelchair 150 feet activity     Assist Wheelchair 150 feet activity did not  occur: N/A       Medical Problem List and Plan: 1.Decreased functional mobility with right side weaknesssecondary to traumatic left subdural hematoma. Status post craniotomy evacuation 07/07/2018 followed by reaccumulation of SDH with redo craniotomy 07/13/2018  Has been d/ced from therapy  Medically stable for d/c to group home   2. Antithrombotics: -DVT/anticoagulation:SCDs -antiplatelet therapy: N/A 3. Pain Management:Tylenol as needed 4. Mood/mild mental retardation with schizophrenia:Patient on Klonopin 0.5 mg daily prior to admission holding for sedation , no evidence of anxiety  and Paxil 20 mg daily, Resume as needed -antipsychotic agents: N/A, home medRisperdal1 mg twice a day.  -sleep chart   -limit neurosedating meds as possible  -resumed Paxil 20mg  on 4/3  5. Neuropsych: This patienthe has notcapable of making decisions on hisown behalf.   6. Skin/Wound Care:Routine skin checks 7. Fluids/Electrolytes/Nutrition:  -encourage po  BMP within acceptable range on 4/3, except for glucose 8. Seizure prophylaxis. Keppra 500 mg twice a day 9. Dysphagia. Advanced to D2 thins, continue to advance as tolerated , intake only 200ml yesterday recheck BMET 10.Tobacco abuse. Counseling HTN - normotensive Vitals:   08/03/18 2022 08/04/18 0544  BP: 128/71 (!) 144/74  Pulse: (!) 54 (!) 48  Resp: 18 18  Temp: 98.4 F (36.9 C) 97.9 F (36.6 C)  SpO2: 100% 94%      14.  Acute blood loss anemia  Hemoglobin 10.9 on 4/6, stable   15.  Urinary frequency- able to inform staff of BR needs  PVRs unremarkable  UA neg , cx showing colonization no tx, afebrile   LOS: 16 days A FACE TO FACE EVALUATION WAS PERFORMED  Michael Mercado E Kirsteins 08/04/2018, 10:34 AM

## 2018-08-04 NOTE — Plan of Care (Signed)
  Problem: Consults Goal: RH BRAIN INJURY PATIENT EDUCATION Description Description: See Patient Education module for eduction specifics Outcome: Progressing   Problem: RH SAFETY Goal: RH STG ADHERE TO SAFETY PRECAUTIONS W/ASSISTANCE/DEVICE Description STG Adhere to Safety Precautions With min Assistance/Device.  Outcome: Progressing   Problem: RH KNOWLEDGE DEFICIT BRAIN INJURY Goal: RH STG INCREASE KNOWLEDGE OF SELF CARE AFTER BRAIN INJURY Description Patient will be able to manage self care after D/C. Cues & Supervision for meds  Outcome: Progressing Goal: RH STG INCREASE KNOWLEDGE OF DYSPHAGIA/FLUID INTAKE Description Patient will demonstrate dietary limits due to dysphagia.   Outcome: Progressing   

## 2018-08-05 ENCOUNTER — Ambulatory Visit: Payer: Medicare Other | Admitting: Family Medicine

## 2018-08-05 NOTE — Progress Notes (Signed)
Social Work Patient ID: Michael Mercado, male   DOB: 08-07-1940, 78 y.o.   MRN: [id number]   CSW received word late in the afternoon today that group home has received written approval for pt to have extra services, so they will be able to come get him tomorrow.  Charge nurse was made aware and talked with group home QP about d/c medications and instructions.  CSW talked with QP, [name] Caple, as well to assure her that [facility] therapies have been arranged (since before 07-27-18) and DME ordered (tub bench and rollator).  CSW will alert [facility] of pt's d/c and nursing staff is prepared for pt's d/c tomorrow.  CSW remains available to assist as needed.  [phone]

## 2018-08-05 NOTE — Progress Notes (Signed)
[name] from group home called and stated that she can arrange for pt to be transported to group home "Saturday or Sunday". She asked what equipments pt is going to need. Boneta Lucks, Child psychotherapist made aware.   Michael Mercado

## 2018-08-05 NOTE — Plan of Care (Signed)
  Problem: Consults Goal: RH BRAIN INJURY PATIENT EDUCATION Description Description: See Patient Education module for eduction specifics Outcome: Progressing   Problem: RH SAFETY Goal: RH STG ADHERE TO SAFETY PRECAUTIONS W/ASSISTANCE/DEVICE Description STG Adhere to Safety Precautions With min Assistance/Device.  Outcome: Progressing   Problem: RH KNOWLEDGE DEFICIT BRAIN INJURY Goal: RH STG INCREASE KNOWLEDGE OF SELF CARE AFTER BRAIN INJURY Description Patient will be able to manage self care after D/C. Cues & Supervision for meds  Outcome: Progressing Goal: RH STG INCREASE KNOWLEDGE OF DYSPHAGIA/FLUID INTAKE Description Patient will demonstrate dietary limits due to dysphagia.   Outcome: Progressing

## 2018-08-06 NOTE — Progress Notes (Signed)
Discharge information given to patient caretaker at the group home. Patient was very excited to be going back to the group home. Patient was wheeled down in wheelchair with his personal belongings.  Lorri Frederick, LPN

## 2018-08-08 DIAGNOSIS — F329 Major depressive disorder, single episode, unspecified: Secondary | ICD-10-CM | POA: Diagnosis not present

## 2018-08-08 DIAGNOSIS — F419 Anxiety disorder, unspecified: Secondary | ICD-10-CM | POA: Diagnosis not present

## 2018-08-08 DIAGNOSIS — S065X9D Traumatic subdural hemorrhage with loss of consciousness of unspecified duration, subsequent encounter: Secondary | ICD-10-CM | POA: Diagnosis not present

## 2018-08-08 DIAGNOSIS — F70 Mild intellectual disabilities: Secondary | ICD-10-CM | POA: Diagnosis not present

## 2018-08-08 DIAGNOSIS — F17210 Nicotine dependence, cigarettes, uncomplicated: Secondary | ICD-10-CM | POA: Diagnosis not present

## 2018-08-08 DIAGNOSIS — I10 Essential (primary) hypertension: Secondary | ICD-10-CM | POA: Diagnosis not present

## 2018-08-08 DIAGNOSIS — Z9181 History of falling: Secondary | ICD-10-CM | POA: Diagnosis not present

## 2018-08-08 DIAGNOSIS — R350 Frequency of micturition: Secondary | ICD-10-CM | POA: Diagnosis not present

## 2018-08-08 DIAGNOSIS — R1310 Dysphagia, unspecified: Secondary | ICD-10-CM | POA: Diagnosis not present

## 2018-08-08 DIAGNOSIS — F209 Schizophrenia, unspecified: Secondary | ICD-10-CM | POA: Diagnosis not present

## 2018-08-08 DIAGNOSIS — E785 Hyperlipidemia, unspecified: Secondary | ICD-10-CM | POA: Diagnosis not present

## 2018-08-08 DIAGNOSIS — R471 Dysarthria and anarthria: Secondary | ICD-10-CM | POA: Diagnosis not present

## 2018-08-08 DIAGNOSIS — G8191 Hemiplegia, unspecified affecting right dominant side: Secondary | ICD-10-CM | POA: Diagnosis not present

## 2018-08-09 ENCOUNTER — Other Ambulatory Visit: Payer: Self-pay

## 2018-08-09 ENCOUNTER — Emergency Department (HOSPITAL_COMMUNITY)
Admission: EM | Admit: 2018-08-09 | Discharge: 2018-08-09 | Disposition: A | Discharge status: Home / Self Care | Payer: Medicare Other | Attending: Emergency Medicine | Admitting: Emergency Medicine

## 2018-08-09 ENCOUNTER — Encounter (HOSPITAL_COMMUNITY): Payer: Self-pay

## 2018-08-09 ENCOUNTER — Emergency Department (HOSPITAL_COMMUNITY): Payer: Medicare Other

## 2018-08-09 DIAGNOSIS — I6200 Nontraumatic subdural hemorrhage, unspecified: Secondary | ICD-10-CM | POA: Diagnosis not present

## 2018-08-09 DIAGNOSIS — R410 Disorientation, unspecified: Secondary | ICD-10-CM | POA: Diagnosis not present

## 2018-08-09 DIAGNOSIS — S065X9D Traumatic subdural hemorrhage with loss of consciousness of unspecified duration, subsequent encounter: Secondary | ICD-10-CM | POA: Diagnosis not present

## 2018-08-09 DIAGNOSIS — R0689 Other abnormalities of breathing: Secondary | ICD-10-CM | POA: Diagnosis not present

## 2018-08-09 DIAGNOSIS — R471 Dysarthria and anarthria: Secondary | ICD-10-CM | POA: Diagnosis not present

## 2018-08-09 DIAGNOSIS — E1165 Type 2 diabetes mellitus with hyperglycemia: Secondary | ICD-10-CM | POA: Diagnosis not present

## 2018-08-09 DIAGNOSIS — R1310 Dysphagia, unspecified: Secondary | ICD-10-CM | POA: Diagnosis not present

## 2018-08-09 DIAGNOSIS — G8191 Hemiplegia, unspecified affecting right dominant side: Secondary | ICD-10-CM | POA: Diagnosis not present

## 2018-08-09 DIAGNOSIS — I10 Essential (primary) hypertension: Secondary | ICD-10-CM | POA: Insufficient documentation

## 2018-08-09 DIAGNOSIS — F70 Mild intellectual disabilities: Secondary | ICD-10-CM | POA: Diagnosis not present

## 2018-08-09 DIAGNOSIS — F17210 Nicotine dependence, cigarettes, uncomplicated: Secondary | ICD-10-CM | POA: Insufficient documentation

## 2018-08-09 DIAGNOSIS — R0902 Hypoxemia: Secondary | ICD-10-CM | POA: Diagnosis not present

## 2018-08-09 DIAGNOSIS — F209 Schizophrenia, unspecified: Secondary | ICD-10-CM | POA: Diagnosis not present

## 2018-08-09 DIAGNOSIS — G459 Transient cerebral ischemic attack, unspecified: Secondary | ICD-10-CM | POA: Diagnosis not present

## 2018-08-09 DIAGNOSIS — R4182 Altered mental status, unspecified: Secondary | ICD-10-CM | POA: Diagnosis not present

## 2018-08-09 LAB — COMPREHENSIVE METABOLIC PANEL
ALT: 18 U/L (ref 0–44)
AST: 17 U/L (ref 15–41)
Albumin: 3.3 g/dL — ABNORMAL LOW (ref 3.5–5.0)
Alkaline Phosphatase: 118 U/L (ref 38–126)
Anion gap: 6 (ref 5–15)
BUN: 11 mg/dL (ref 8–23)
CO2: 25 mmol/L (ref 22–32)
Calcium: 8.5 mg/dL — ABNORMAL LOW (ref 8.9–10.3)
Chloride: 106 mmol/L (ref 98–111)
Creatinine, Ser: 1.1 mg/dL (ref 0.61–1.24)
GFR calc Af Amer: >60 mL/min (ref >60)
GFR calc non Af Amer: >60 mL/min (ref >60)
Glucose, Bld: 108 mg/dL — ABNORMAL HIGH (ref 70–99)
Potassium: 3.9 mmol/L (ref 3.5–5.1)
Sodium: 137 mmol/L (ref 135–145)
Total Bilirubin: 0.2 mg/dL — ABNORMAL LOW (ref 0.3–1.2)
Total Protein: 6.1 g/dL — ABNORMAL LOW (ref 6.5–8.1)

## 2018-08-09 LAB — CBC WITH DIFFERENTIAL/PLATELET
Abs Immature Granulocytes: 0.02 10*3/uL (ref 0.00–0.07)
Basophils Absolute: 0 10*3/uL (ref 0.0–0.1)
Basophils Relative: 1 %
Eosinophils Absolute: 0.4 10*3/uL (ref 0.0–0.5)
Eosinophils Relative: 7 %
HCT: 36.9 % — ABNORMAL LOW (ref 39.0–52.0)
Hemoglobin: 11.6 g/dL — ABNORMAL LOW (ref 13.0–17.0)
Immature Granulocytes: 0 %
Lymphocytes Relative: 18 %
Lymphs Abs: 0.9 10*3/uL (ref 0.7–4.0)
MCH: 28.7 pg (ref 26.0–34.0)
MCHC: 31.4 g/dL (ref 30.0–36.0)
MCV: 91.3 fL (ref 80.0–100.0)
Monocytes Absolute: 0.7 10*3/uL (ref 0.1–1.0)
Monocytes Relative: 13 %
Neutro Abs: 3.2 10*3/uL (ref 1.7–7.7)
Neutrophils Relative %: 61 %
Platelets: 213 10*3/uL (ref 150–400)
RBC: 4.04 MIL/uL — ABNORMAL LOW (ref 4.22–5.81)
RDW: 14.7 % (ref 11.5–15.5)
WBC: 5.2 10*3/uL (ref 4.0–10.5)
nRBC: 0 % (ref 0.0–0.2)

## 2018-08-09 NOTE — ED Notes (Signed)
Spoke with pts niece, niece stated she would call group home to arrange transportation back to group home.

## 2018-08-09 NOTE — Discharge Instructions (Signed)
As discussed, your evaluation today has been largely reassuring.  But, it is important that you monitor your condition carefully, and do not hesitate to return to the ED if you develop new, or concerning changes in your condition. ? ?Otherwise, please follow-up with your physician for appropriate ongoing care. ? ?

## 2018-08-09 NOTE — ED Triage Notes (Signed)
Ems reports pt is a resident of [facility] and had recent brain bleed.  Reports staff told ems he "wasn't acting right since being home from hospital 2 days ago.  Also reports generalized weakness.  CBG 284 per ems.

## 2018-08-09 NOTE — ED Provider Notes (Signed)
[facility] EMERGENCY DEPARTMENT Provider Note   CSN: [id number] Arrival date & time: 08/09/18  1554    History   Chief Complaint Chief Complaint  Patient presents with  . Altered Mental Status    HPI Michael Mercado is a 78 y.o. male.     HPI Patient presents from his nursing facility with staff concern of altered mental status. Patient has intellectual disability, but seems to answer questions with either nodding or brief verbal responses appropriately. However, the patient cannot specify any details beyond his current situation, level 5 caveat secondary to cognitive condition. Reportedly the patient has a history of intracranial hemorrhage, and was recently discharged from the hospital back to his group facility. Reportedly the patient has not been interacting in his typical manner since returning to that facility. No reported new fever, fall, vomiting, diarrhea. No reported new medication, diet. Past Medical History:  Diagnosis Date  . Hypercholesteremia   . Hypertension   . Moderate intellectual disability   . Mood disorder (HCC)   . MR (mental retardation)   . Obesity     Patient Active Problem List   Diagnosis Date Noted  . Urinary frequency   . Acute blood loss anemia   . Leukocytosis   . Labile blood pressure   . Hyperglycemia   . Dysphagia   . Traumatic subdural hematoma (H[facility]) 07/19/2018  . Subdural hematoma (H[facility]) 07/07/2018  . Endotracheally intubated 07/07/2018  . Encounter for postanesthesia care 07/07/2018  . CKD (chronic kidney disease), stage II 03/18/2016  . HTN (hypertension), benign 03/18/2015  . COPD (chronic obstructive pulmonary disease) (HCC) 03/18/2015  . Intellectual disability 03/18/2015  . Schizophrenia (HCC) 03/18/2015  . Hyperlipidemia LDL goal <130 03/18/2015    Past Surgical History:  Procedure Laterality Date  . CRANIOTOMY Left 07/07/2018   Procedure: CRANIOTOMY HEMATOMA EVACUATION SUBDURAL;  Surgeon: Tia Alert, MD;   [facility];  Service: Neurosurgery;  Laterality: Left;  . CRANIOTOMY Left 07/13/2018   Procedure: Redo Left CRANIOTOMY FOR SUBDURAL HEMATOMA;  Surgeon: Tia Alert, MD;  Location: [facility] OR;  Service: Neurosurgery;  Laterality: Left;        Home Medications    Prior to Admission medications   Medication Sig Start Date End Date Taking? Authorizing Provider  BREO ELLIPTA 100-25 MCG/INH AEPB INHALE 1 PUFF ONCE DAILY. Patient taking differently: Inhale 1 puff into the lungs daily.  06/23/18  Yes Dettinger, Elige Radon, MD  levETIRAcetam (KEPPRA) 500 MG tablet Take 1 tablet (500 mg total) by mouth 2 (two) times daily. 07/28/18  Yes Angiulli, Mcarthur Rossetti, PA-C  pantoprazole (PROTONIX) 40 MG tablet Take 1 tablet (40 mg total) by mouth daily. 07/28/18  Yes Angiulli, Mcarthur Rossetti, PA-C  PARoxetine (PAXIL) 20 MG tablet Take 1 tablet (20 mg total) by mouth at bedtime. 07/28/18  Yes Angiulli, Mcarthur Rossetti, PA-C    Family History Family History  Problem Relation Age of Onset  . Stroke Sister     Social History Social History   Tobacco Use  . Smoking status: Current Every Day Smoker    Packs/day: 0.33    Types: Cigarettes  . Smokeless tobacco: Never Used  Substance Use Topics  . Alcohol use: No    Alcohol/week: 0.0 standard drinks  . Drug use: No     Allergies   Patient has no known allergies.   Review of Systems Review of Systems  Unable to perform ROS: Other  mental retardation  Physical Exam Updated Vital Signs BP [name])  159/83   Pulse 66   Temp (!) 97.5 F (36.4 C) (Oral)   Resp 19   Wt 78.2 kg   SpO2 96%   BMI 27.83 kg/m   Physical Exam Vitals signs and nursing note reviewed.  Constitutional:      Appearance: He is well-developed.     Comments: Chronically ill appearing  HENT:     Head: Normocephalic and atraumatic.  Eyes:     Conjunctiva/sclera: Conjunctivae normal.  Cardiovascular:     Rate and Rhythm: Normal rate and regular rhythm.  Pulmonary:     Effort: Pulmonary  effort is normal. No respiratory distress.     Breath sounds: No stridor.  Abdominal:     General: There is no distension.  Skin:    General: Skin is warm and dry.  Neurological:     Mental Status: He is alert.     Motor: Atrophy present. No tremor or abnormal muscle tone.     Comments: Moves all extremities spontaneously, follows commands appropriately, offers very brief, but appropriate responses  Psychiatric:        Behavior: Behavior is withdrawn.        Cognition and Memory: Cognition is impaired.      ED Treatments / Results  Labs (all labs ordered are listed, but only abnormal results are displayed) Labs Reviewed  COMPREHENSIVE METABOLIC PANEL - Abnormal; Notable for the following components:      Result Value   Glucose, Bld 108 (*)    Calcium 8.5 (*)    Total Protein 6.1 (*)    Albumin 3.3 (*)    Total Bilirubin 0.2 (*)    All other components within normal limits  CBC WITH DIFFERENTIAL/PLATELET - Abnormal; Notable for the following components:   RBC 4.04 (*)    Hemoglobin 11.6 (*)    HCT 36.9 (*)    All other components within normal limits    EKG EKG Interpretation  Date/Time:  Tuesday August 09 2018 16:09:41 EDT Ventricular Rate:  69 PR Interval:    QRS Duration: 105 QT Interval:  374 QTC Calculation: 401 R Axis:   86 Text Interpretation:  Sinus rhythm Borderline right axis deviation Anteroseptal infarct, old Borderline repolarization abnormality T wave abnormality Abnormal ekg Confirmed by Gerhard Munch [phone]) on 08/09/2018 4:16:28 PM   Radiology Dg Chest 2 View  Result Date: 08/09/2018 CLINICAL DATA:  Altered mental status EXAM: CHEST - 2 VIEW COMPARISON:  07/14/2018 FINDINGS: There is mild bilateral interstitial thickening. There is no focal consolidation. There is no pleural effusion or pneumothorax. The heart and mediastinal contours are unremarkable. The osseous structures are unremarkable. IMPRESSION: No active cardiopulmonary disease.  Electronically Signed   By: Elige Ko   On: 08/09/2018 18:40   Ct Head Wo Contrast  Result Date: 08/09/2018 CLINICAL DATA:  Altered level of consciousness.  Follow-up subdural EXAM: CT HEAD WITHOUT CONTRAST TECHNIQUE: Contiguous axial images were obtained from the base of the skull through the vertex without intravenous contrast. COMPARISON:  CT of 07/14/2018 FINDINGS: Brain: Left frontal craniotomy. Subdural drain is been removed. Residual subdural fluid and gas has improved in the interval. Decreased high-density hemorrhage in the left subdural space. Low-density fluid in the left frontal subdural space measures up to 9 mm in thickness. No significant midline shift. Mild atrophy and chronic microvascular ischemic change in the white matter. No acute infarct. Negative for hydrocephalus. Vascular: Negative for hyperdense vessel Skull: Left frontal craniotomy. Sinuses/Orbits: Mucosal edema paranasal sinuses.  Negative orbit Other:  None IMPRESSION: Interval improvement in subdural hematoma on the left which is now lower density and smaller compared to the prior study. Left subdural drain has been removed since the prior study. No significant residual midline shift. Electronically Signed   By: Marlan Palauharles  Clark M.D.   On: 08/09/2018 18:52    Procedures Procedures (including critical care time)  Medications Ordered in ED Medications - No data to display   Initial Impression / Assessment and Plan / ED Course  I have reviewed the triage vital signs and the nursing notes.  Pertinent labs & imaging results that were available during my care of the patient were reviewed by me and considered in my medical decision making (see chart for details).    7:13 PM Patient in no distress. This elderly male with mental retardation, recent subdural hemorrhage presents from his nursing facility 2 days after returning there following long hospitalization for after mentioned brain injury. Here the patient is awake  and alert, denies complaints. He is hemodynamically unremarkable, has a soft abdomen, there is low suspicion for acute new processes given his reassuring labs, absence of fever, complaints. Some suspicion for prolonged recovery from his brain bleed. Patient is a nursing home resident, has monitoring, was discharged to that facility with return precautions, follow-up instructions.  Final Clinical Impressions(s) / ED Diagnoses   Final diagnoses:  Confusion     Gerhard MunchLockwood, Shelena Castelluccio, MD 08/09/18 [phone]

## 2018-08-10 ENCOUNTER — Encounter: Payer: Self-pay | Admitting: Registered Nurse

## 2018-08-10 ENCOUNTER — Encounter: Payer: Medicare Other | Attending: Registered Nurse | Admitting: Registered Nurse

## 2018-08-10 DIAGNOSIS — S065X9A Traumatic subdural hemorrhage with loss of consciousness of unspecified duration, initial encounter: Secondary | ICD-10-CM

## 2018-08-10 DIAGNOSIS — S065XAA Traumatic subdural hemorrhage with loss of consciousness status unknown, initial encounter: Secondary | ICD-10-CM

## 2018-08-10 DIAGNOSIS — Z298 Encounter for other specified prophylactic measures: Secondary | ICD-10-CM | POA: Diagnosis not present

## 2018-08-10 NOTE — Progress Notes (Signed)
Transitional Care call Transitional Questions answered by [name] Caple QP  Patient name: Michael Mercado DOB: 14-Nov-1940 1. Are you/is patient experiencing any problems since coming home? Yes, he [facility] to [facility] ED [facility] 08/09/2018 for Altered Mental Status. Note was reviewed. He was discharged to group home.  a. Are there any questions regarding any aspect of care? No 2. Are there any questions regarding medications administration/dosing? No a. Are meds being taken as prescribed? Ms. Caple reports [name] is receiving his medications as prescribed at the Group Home.  b. "Patient should review meds with caller to confirm"  3. Have there been any falls? No 4. Has Home Health been to the house and/or have they contacted you? Yes, [facility].  a. If not, have you tried to contact them? NA b. Can we help you contact them? NA 5. Are bowels and bladder emptying properly? Yes a. Are there any unexpected incontinence issues? NA b. If applicable, is patient following bowel/bladder programs? NA 6. Any fevers, problems with breathing, unexpected pain? No 7. Are there any skin problems or new areas of breakdown? No 8. Has the patient/family member arranged specialty MD follow up (ie cardiology/neurology/renal/surgical/etc.)?  Ms. Doristine Church was instructed to call Dr. Yetta Barre office to schedule HFU appointment, she verbalizes understanding.  a. Can we help arrange? NA 9. Does the patient need any other services or support that we can help arrange? No 10. Are caregivers following through as expected in assisting the patient? He lives at Group Home  11. Has the patient quit smoking, drinking alcohol, or using drugs as recommended? Ms. Caple reports [name] had one cigarette since his discharged, he doesn't drink alcohol or use illicit drugs.   Appointment date/time 09/01/2018  arrival time 10:30 for 10:45 appointment. With Dr. Wynn Banker. At [address] [employer] [facility] [address]

## 2018-08-11 ENCOUNTER — Encounter: Payer: Self-pay | Admitting: Family Medicine

## 2018-08-11 ENCOUNTER — Other Ambulatory Visit: Payer: Self-pay

## 2018-08-11 ENCOUNTER — Encounter: Payer: Medicare Other | Admitting: Registered Nurse

## 2018-08-11 ENCOUNTER — Ambulatory Visit (INDEPENDENT_AMBULATORY_CARE_PROVIDER_SITE_OTHER): Payer: Medicare Other | Admitting: Family Medicine

## 2018-08-11 DIAGNOSIS — R630 Anorexia: Secondary | ICD-10-CM

## 2018-08-11 DIAGNOSIS — S065X9D Traumatic subdural hemorrhage with loss of consciousness of unspecified duration, subsequent encounter: Secondary | ICD-10-CM | POA: Diagnosis not present

## 2018-08-11 DIAGNOSIS — R1310 Dysphagia, unspecified: Secondary | ICD-10-CM | POA: Diagnosis not present

## 2018-08-11 DIAGNOSIS — S065XAA Traumatic subdural hemorrhage with loss of consciousness status unknown, initial encounter: Secondary | ICD-10-CM

## 2018-08-11 DIAGNOSIS — Z298 Encounter for other specified prophylactic measures: Secondary | ICD-10-CM | POA: Diagnosis not present

## 2018-08-11 DIAGNOSIS — S065X9A Traumatic subdural hemorrhage with loss of consciousness of unspecified duration, initial encounter: Secondary | ICD-10-CM | POA: Diagnosis not present

## 2018-08-11 DIAGNOSIS — R471 Dysarthria and anarthria: Secondary | ICD-10-CM | POA: Diagnosis not present

## 2018-08-11 DIAGNOSIS — F209 Schizophrenia, unspecified: Secondary | ICD-10-CM | POA: Diagnosis not present

## 2018-08-11 DIAGNOSIS — F70 Mild intellectual disabilities: Secondary | ICD-10-CM | POA: Diagnosis not present

## 2018-08-11 DIAGNOSIS — G8191 Hemiplegia, unspecified affecting right dominant side: Secondary | ICD-10-CM | POA: Diagnosis not present

## 2018-08-11 MED ORDER — BOOST HIGH PROTEIN PO LIQD: 1.0000 | Freq: Three times a day (TID) | ORAL | 1 refills | Status: DC

## 2018-08-11 NOTE — Progress Notes (Signed)
Virtual Visit via telephone Note  I connected with Amador Cunasharles E Clippinger on 08/11/18 at 1332 by telephone and verified that I am speaking with the correct person using two identifiers. Amador CunasCharles E Delarocha is currently located at car and guardian are currently with her during visit. The provider, Elige RadonJoshua A Dettinger, MD is located in their office at time of visit.  Call ended at 1355  I discussed the limitations, risks, security and privacy concerns of performing an evaluation and management service by telephone and the availability of in person appointments. I also discussed with the patient that there may be a patient responsible charge related to this service. The patient expressed understanding and agreed to proceed.   History and Present Illness: Patient fell but did not hit his head on 07/06/2018. He was admitted for subdural hematoma on 07/07/2018 and discharged on keppra for seizure prophylaxis.  He was having tremors that were new and he went home on the 18th and then fell at home off of bed and rolled of bed. He was being confused and having trouble moving the right side of his bed.  He was transported to [facility] and he was not responding appropriately and that is when they actually found the hematoma on the 19th..  Patient was taken back 2 days ago because of confusion and anorexia and hypersomnolence. He had a craniotomy. He now is improving, but appetite is still down.  He is still having some right sided weakness and face droop but improving daily. He has follow up with neurology.  Her [place] his caregiver at [facility] she says that he has been a lot more sleepy but otherwise his confusion has improved and his right-sided weakness is improving.  He is working with therapy multiple times a week and has been doing well on it.  No diagnosis found.  Outpatient Encounter Medications as of 08/11/2018  Medication Sig  . BREO ELLIPTA 100-25 MCG/INH AEPB INHALE 1 PUFF ONCE DAILY.  (Patient taking differently: Inhale 1 puff into the lungs daily. )  . levETIRAcetam (KEPPRA) 500 MG tablet Take 1 tablet (500 mg total) by mouth 2 (two) times daily.  . pantoprazole (PROTONIX) 40 MG tablet Take 1 tablet (40 mg total) by mouth daily.  [name]. PARoxetine (PAXIL) 20 MG tablet Take 1 tablet (20 mg total) by mouth at bedtime.   No facility-administered encounter medications on file as of 08/11/2018.     Review of Systems  Constitutional: Positive for fatigue. Negative for chills and fever.  HENT: Negative for congestion, ear discharge, ear pain, postnasal drip, rhinorrhea, sinus pressure, sneezing, sore throat and voice change.   Eyes: Negative for pain, discharge, redness and visual disturbance.  Respiratory: Negative for cough, shortness of breath and wheezing.   Cardiovascular: Negative for chest pain and leg swelling.  Musculoskeletal: Positive for gait problem.  Skin: Negative for rash.  Neurological: Positive for weakness. Negative for dizziness, light-headedness, numbness and headaches.  Psychiatric/Behavioral: Negative for confusion, decreased concentration, self-injury, sleep disturbance and suicidal ideas. The patient is not nervous/anxious.   All other systems reviewed and are negative.   Observations/Objective: [place] his guardian from [facility] is on phone  Assessment and Plan: Problem List Items Addressed This Visit      Nervous and Auditory   Subdural hematoma (HCC) - Primary    Other Visit Diagnoses    Seizure prophylaxis           Follow Up Instructions:  Try some boost or  ensure because he is not eating as well  Has speech therapy and OT and PT  Follow up as needed  Is isolated and has bed monitor to watch for falls   I discussed the assessment and treatment plan with the patient. The patient was provided an opportunity to ask questions and all were answered. The patient agreed with the plan and demonstrated an understanding of the  instructions.   The patient was advised to call back or seek an in-person evaluation if the symptoms worsen or if the condition fails to improve as anticipated.  The above assessment and management plan was discussed with the patient. The patient verbalized understanding of and has agreed to the management plan. Patient is aware to call the clinic if symptoms persist or worsen. Patient is aware when to return to the clinic for a follow-up visit. Patient educated on when it is appropriate to go to the emergency department.    I provided 23 minutes of non-face-to-face time during this encounter.    [name], MD

## 2018-08-12 ENCOUNTER — Ambulatory Visit: Payer: Medicare Other | Admitting: Family Medicine

## 2018-08-12 DIAGNOSIS — R1310 Dysphagia, unspecified: Secondary | ICD-10-CM | POA: Diagnosis not present

## 2018-08-12 DIAGNOSIS — F209 Schizophrenia, unspecified: Secondary | ICD-10-CM | POA: Diagnosis not present

## 2018-08-12 DIAGNOSIS — F70 Mild intellectual disabilities: Secondary | ICD-10-CM | POA: Diagnosis not present

## 2018-08-12 DIAGNOSIS — R471 Dysarthria and anarthria: Secondary | ICD-10-CM | POA: Diagnosis not present

## 2018-08-12 DIAGNOSIS — S065X9D Traumatic subdural hemorrhage with loss of consciousness of unspecified duration, subsequent encounter: Secondary | ICD-10-CM | POA: Diagnosis not present

## 2018-08-12 DIAGNOSIS — G8191 Hemiplegia, unspecified affecting right dominant side: Secondary | ICD-10-CM | POA: Diagnosis not present

## 2018-08-15 DIAGNOSIS — G8191 Hemiplegia, unspecified affecting right dominant side: Secondary | ICD-10-CM | POA: Diagnosis not present

## 2018-08-15 DIAGNOSIS — S065X9D Traumatic subdural hemorrhage with loss of consciousness of unspecified duration, subsequent encounter: Secondary | ICD-10-CM | POA: Diagnosis not present

## 2018-08-15 DIAGNOSIS — R471 Dysarthria and anarthria: Secondary | ICD-10-CM | POA: Diagnosis not present

## 2018-08-15 DIAGNOSIS — R1310 Dysphagia, unspecified: Secondary | ICD-10-CM | POA: Diagnosis not present

## 2018-08-15 DIAGNOSIS — F209 Schizophrenia, unspecified: Secondary | ICD-10-CM | POA: Diagnosis not present

## 2018-08-15 DIAGNOSIS — F70 Mild intellectual disabilities: Secondary | ICD-10-CM | POA: Diagnosis not present

## 2018-08-16 DIAGNOSIS — G8191 Hemiplegia, unspecified affecting right dominant side: Secondary | ICD-10-CM | POA: Diagnosis not present

## 2018-08-16 DIAGNOSIS — R471 Dysarthria and anarthria: Secondary | ICD-10-CM | POA: Diagnosis not present

## 2018-08-16 DIAGNOSIS — F209 Schizophrenia, unspecified: Secondary | ICD-10-CM | POA: Diagnosis not present

## 2018-08-16 DIAGNOSIS — F70 Mild intellectual disabilities: Secondary | ICD-10-CM | POA: Diagnosis not present

## 2018-08-16 DIAGNOSIS — S065X9D Traumatic subdural hemorrhage with loss of consciousness of unspecified duration, subsequent encounter: Secondary | ICD-10-CM | POA: Diagnosis not present

## 2018-08-16 DIAGNOSIS — R1310 Dysphagia, unspecified: Secondary | ICD-10-CM | POA: Diagnosis not present

## 2018-08-17 ENCOUNTER — Ambulatory Visit (INDEPENDENT_AMBULATORY_CARE_PROVIDER_SITE_OTHER): Payer: Medicare Other

## 2018-08-17 ENCOUNTER — Other Ambulatory Visit: Payer: Self-pay

## 2018-08-17 DIAGNOSIS — R350 Frequency of micturition: Secondary | ICD-10-CM | POA: Diagnosis not present

## 2018-08-17 DIAGNOSIS — F419 Anxiety disorder, unspecified: Secondary | ICD-10-CM

## 2018-08-17 DIAGNOSIS — S065X9D Traumatic subdural hemorrhage with loss of consciousness of unspecified duration, subsequent encounter: Secondary | ICD-10-CM | POA: Diagnosis not present

## 2018-08-17 DIAGNOSIS — R471 Dysarthria and anarthria: Secondary | ICD-10-CM

## 2018-08-17 DIAGNOSIS — F70 Mild intellectual disabilities: Secondary | ICD-10-CM | POA: Diagnosis not present

## 2018-08-17 DIAGNOSIS — F209 Schizophrenia, unspecified: Secondary | ICD-10-CM

## 2018-08-17 DIAGNOSIS — E785 Hyperlipidemia, unspecified: Secondary | ICD-10-CM

## 2018-08-17 DIAGNOSIS — R1310 Dysphagia, unspecified: Secondary | ICD-10-CM | POA: Diagnosis not present

## 2018-08-17 DIAGNOSIS — F329 Major depressive disorder, single episode, unspecified: Secondary | ICD-10-CM

## 2018-08-17 DIAGNOSIS — I10 Essential (primary) hypertension: Secondary | ICD-10-CM | POA: Diagnosis not present

## 2018-08-17 DIAGNOSIS — F17210 Nicotine dependence, cigarettes, uncomplicated: Secondary | ICD-10-CM | POA: Diagnosis not present

## 2018-08-17 DIAGNOSIS — G8191 Hemiplegia, unspecified affecting right dominant side: Secondary | ICD-10-CM | POA: Diagnosis not present

## 2018-08-17 DIAGNOSIS — Z9181 History of falling: Secondary | ICD-10-CM

## 2018-08-18 ENCOUNTER — Other Ambulatory Visit: Payer: Self-pay | Admitting: Family Medicine

## 2018-08-19 ENCOUNTER — Other Ambulatory Visit: Payer: Self-pay | Admitting: Physical Medicine & Rehabilitation

## 2018-08-19 DIAGNOSIS — F70 Mild intellectual disabilities: Secondary | ICD-10-CM | POA: Diagnosis not present

## 2018-08-19 DIAGNOSIS — F209 Schizophrenia, unspecified: Secondary | ICD-10-CM | POA: Diagnosis not present

## 2018-08-19 DIAGNOSIS — G8191 Hemiplegia, unspecified affecting right dominant side: Secondary | ICD-10-CM | POA: Diagnosis not present

## 2018-08-19 DIAGNOSIS — S065X9D Traumatic subdural hemorrhage with loss of consciousness of unspecified duration, subsequent encounter: Secondary | ICD-10-CM | POA: Diagnosis not present

## 2018-08-19 DIAGNOSIS — R1310 Dysphagia, unspecified: Secondary | ICD-10-CM | POA: Diagnosis not present

## 2018-08-19 DIAGNOSIS — R471 Dysarthria and anarthria: Secondary | ICD-10-CM | POA: Diagnosis not present

## 2018-08-22 DIAGNOSIS — S065X9D Traumatic subdural hemorrhage with loss of consciousness of unspecified duration, subsequent encounter: Secondary | ICD-10-CM | POA: Diagnosis not present

## 2018-08-22 DIAGNOSIS — G8191 Hemiplegia, unspecified affecting right dominant side: Secondary | ICD-10-CM | POA: Diagnosis not present

## 2018-08-22 DIAGNOSIS — R471 Dysarthria and anarthria: Secondary | ICD-10-CM | POA: Diagnosis not present

## 2018-08-22 DIAGNOSIS — R1310 Dysphagia, unspecified: Secondary | ICD-10-CM | POA: Diagnosis not present

## 2018-08-22 DIAGNOSIS — F70 Mild intellectual disabilities: Secondary | ICD-10-CM | POA: Diagnosis not present

## 2018-08-22 DIAGNOSIS — F209 Schizophrenia, unspecified: Secondary | ICD-10-CM | POA: Diagnosis not present

## 2018-08-24 DIAGNOSIS — F70 Mild intellectual disabilities: Secondary | ICD-10-CM | POA: Diagnosis not present

## 2018-08-24 DIAGNOSIS — G8191 Hemiplegia, unspecified affecting right dominant side: Secondary | ICD-10-CM | POA: Diagnosis not present

## 2018-08-24 DIAGNOSIS — R1310 Dysphagia, unspecified: Secondary | ICD-10-CM | POA: Diagnosis not present

## 2018-08-24 DIAGNOSIS — F209 Schizophrenia, unspecified: Secondary | ICD-10-CM | POA: Diagnosis not present

## 2018-08-24 DIAGNOSIS — R471 Dysarthria and anarthria: Secondary | ICD-10-CM | POA: Diagnosis not present

## 2018-08-24 DIAGNOSIS — S065X9D Traumatic subdural hemorrhage with loss of consciousness of unspecified duration, subsequent encounter: Secondary | ICD-10-CM | POA: Diagnosis not present

## 2018-08-25 ENCOUNTER — Emergency Department (HOSPITAL_COMMUNITY): Payer: Medicare Other

## 2018-08-25 ENCOUNTER — Other Ambulatory Visit: Payer: Self-pay

## 2018-08-25 ENCOUNTER — Ambulatory Visit: Payer: Medicare Other | Admitting: Family Medicine

## 2018-08-25 ENCOUNTER — Emergency Department (HOSPITAL_COMMUNITY)
Admission: EM | Admit: 2018-08-25 | Discharge: 2018-08-25 | Disposition: A | Discharge status: Home / Self Care | Payer: Medicare Other | Attending: Emergency Medicine | Admitting: Emergency Medicine

## 2018-08-25 ENCOUNTER — Encounter (HOSPITAL_COMMUNITY): Payer: Self-pay

## 2018-08-25 DIAGNOSIS — I129 Hypertensive chronic kidney disease with stage 1 through stage 4 chronic kidney disease, or unspecified chronic kidney disease: Secondary | ICD-10-CM | POA: Insufficient documentation

## 2018-08-25 DIAGNOSIS — Z79899 Other long term (current) drug therapy: Secondary | ICD-10-CM | POA: Insufficient documentation

## 2018-08-25 DIAGNOSIS — F17210 Nicotine dependence, cigarettes, uncomplicated: Secondary | ICD-10-CM | POA: Diagnosis not present

## 2018-08-25 DIAGNOSIS — S065X0A Traumatic subdural hemorrhage without loss of consciousness, initial encounter: Secondary | ICD-10-CM | POA: Diagnosis not present

## 2018-08-25 DIAGNOSIS — F70 Mild intellectual disabilities: Secondary | ICD-10-CM | POA: Diagnosis not present

## 2018-08-25 DIAGNOSIS — R410 Disorientation, unspecified: Secondary | ICD-10-CM | POA: Diagnosis not present

## 2018-08-25 DIAGNOSIS — I10 Essential (primary) hypertension: Secondary | ICD-10-CM | POA: Diagnosis not present

## 2018-08-25 DIAGNOSIS — J449 Chronic obstructive pulmonary disease, unspecified: Secondary | ICD-10-CM | POA: Insufficient documentation

## 2018-08-25 DIAGNOSIS — R001 Bradycardia, unspecified: Secondary | ICD-10-CM | POA: Diagnosis not present

## 2018-08-25 DIAGNOSIS — F209 Schizophrenia, unspecified: Secondary | ICD-10-CM | POA: Diagnosis not present

## 2018-08-25 DIAGNOSIS — G8191 Hemiplegia, unspecified affecting right dominant side: Secondary | ICD-10-CM | POA: Diagnosis not present

## 2018-08-25 DIAGNOSIS — N182 Chronic kidney disease, stage 2 (mild): Secondary | ICD-10-CM | POA: Insufficient documentation

## 2018-08-25 DIAGNOSIS — S065X9D Traumatic subdural hemorrhage with loss of consciousness of unspecified duration, subsequent encounter: Secondary | ICD-10-CM | POA: Diagnosis not present

## 2018-08-25 DIAGNOSIS — R471 Dysarthria and anarthria: Secondary | ICD-10-CM | POA: Diagnosis not present

## 2018-08-25 DIAGNOSIS — R030 Elevated blood-pressure reading, without diagnosis of hypertension: Secondary | ICD-10-CM | POA: Diagnosis present

## 2018-08-25 DIAGNOSIS — R1310 Dysphagia, unspecified: Secondary | ICD-10-CM | POA: Diagnosis not present

## 2018-08-25 LAB — BASIC METABOLIC PANEL
Anion gap: 10 (ref 5–15)
BUN: 15 mg/dL (ref 8–23)
CO2: 22 mmol/L (ref 22–32)
Calcium: 8.9 mg/dL (ref 8.9–10.3)
Chloride: 106 mmol/L (ref 98–111)
Creatinine, Ser: 1.3 mg/dL — ABNORMAL HIGH (ref 0.61–1.24)
GFR calc Af Amer: >60 mL/min (ref >60)
GFR calc non Af Amer: 53 mL/min — ABNORMAL LOW (ref >60)
Glucose, Bld: 100 mg/dL — ABNORMAL HIGH (ref 70–99)
Potassium: 3.9 mmol/L (ref 3.5–5.1)
Sodium: 138 mmol/L (ref 135–145)

## 2018-08-25 LAB — CBC
HCT: 37.5 % — ABNORMAL LOW (ref 39.0–52.0)
Hemoglobin: 11.8 g/dL — ABNORMAL LOW (ref 13.0–17.0)
MCH: 28 pg (ref 26.0–34.0)
MCHC: 31.5 g/dL (ref 30.0–36.0)
MCV: 88.9 fL (ref 80.0–100.0)
Platelets: 258 10*3/uL (ref 150–400)
RBC: 4.22 MIL/uL (ref 4.22–5.81)
RDW: 14.4 % (ref 11.5–15.5)
WBC: 6.9 10*3/uL (ref 4.0–10.5)
nRBC: 0 % (ref 0.0–0.2)

## 2018-08-25 MED ORDER — AMLODIPINE BESYLATE 5 MG PO TABS
5.0000 mg | ORAL_TABLET | Freq: Once | ORAL | Status: AC
  Administered 2018-08-25: 5 mg via ORAL
  Filled 2018-08-25: qty 1

## 2018-08-25 MED ORDER — AMLODIPINE BESYLATE 5 MG PO TABS: 5.0000 mg | ORAL_TABLET | Freq: Every day | ORAL | 0 refills | Status: DC

## 2018-08-25 MED ORDER — LORAZEPAM 2 MG/ML IJ SOLN
1.0000 mg | Freq: Once | INTRAMUSCULAR | Status: AC
  Administered 2018-08-25: 1 mg via INTRAVENOUS
  Filled 2018-08-25: qty 1

## 2018-08-25 NOTE — ED Notes (Signed)
[name] Wilson [phone], [facility] Nursing Administrator. Requested to be notified when pt is discharged so that she can arrange transportation.

## 2018-08-25 NOTE — Discharge Instructions (Addendum)
You were seen in the emergency department for elevated blood pressures.  You had lab work and a CAT scan of your head.  The CAT scan showed that the subdural was slightly improved from the last time.  Your blood pressure was still elevated here and you were given a dose of amlodipine which is a blood pressure medicine you are on in the past.  Please contact your primary care doctor to confirm that they want you to continue to take the amlodipine.  We have started you on a lower dose than you are on prior.

## 2018-08-25 NOTE — ED Notes (Signed)
Will go over dc papers with [facility] when they arrive. Stated will be about 30 min

## 2018-08-25 NOTE — ED Provider Notes (Addendum)
[facility] EMERGENCY DEPARTMENT Provider Note   CSN: [id number] Arrival date & time: 08/25/18  1342    History   Chief Complaint Chief Complaint  Patient presents with  . Hypertension    HPI Michael Mercado is a 78 y.o. male.     Patient presents with elevated blood pressure.  Patient has a history of elevated blood pressure but currently not on any medications since his traumatic subdural hematoma.  Patient has been followed outpatient for this and on seizure prophylaxis.  No witnessed seizures.  Patient has no symptoms at this time.  Attempted to call his group home and no answer at this time.     Past Medical History:  Diagnosis Date  . Hypercholesteremia   . Hypertension   . Moderate intellectual disability   . Mood disorder (HCC)   . MR (mental retardation)   . Obesity     Patient Active Problem List   Diagnosis Date Noted  . Urinary frequency   . Acute blood loss anemia   . Leukocytosis   . Labile blood pressure   . Hyperglycemia   . Dysphagia   . Traumatic subdural hematoma (HCC) 07/19/2018  . Subdural hematoma (HCC) 07/07/2018  . Endotracheally intubated 07/07/2018  . Encounter for postanesthesia care 07/07/2018  . CKD (chronic kidney disease), stage II 03/18/2016  . HTN (hypertension), benign 03/18/2015  . COPD (chronic obstructive pulmonary disease) (HCC) 03/18/2015  . Intellectual disability 03/18/2015  . Schizophrenia (HCC) 03/18/2015  . Hyperlipidemia LDL goal <130 03/18/2015    Past Surgical History:  Procedure Laterality Date  . CRANIOTOMY Left 07/07/2018   Procedure: CRANIOTOMY HEMATOMA EVACUATION SUBDURAL;  Surgeon: Tia AlertJones, David S, MD;  [facility];  Service: Neurosurgery;  Laterality: Left;  . CRANIOTOMY Left 07/13/2018   Procedure: Redo Left CRANIOTOMY FOR SUBDURAL HEMATOMA;  Surgeon: Tia AlertJones, David S, MD;  [facility];  Service: Neurosurgery;  Laterality: Left;        Home Medications    Prior to Admission medications    Medication Sig Start Date End Date Taking? Authorizing Provider  BREO ELLIPTA 100-25 MCG/INH AEPB INHALE 1 PUFF ONCE DAILY. Patient taking differently: Inhale 1 puff into the lungs daily.  06/23/18   Dettinger, Elige RadonJoshua A, MD  feeding supplement (BOOST HIGH PROTEIN) LIQD Take 237 mLs by mouth 3 (three) times daily with meals. 08/11/18   Dettinger, Elige RadonJoshua A, MD  levETIRAcetam (KEPPRA) 500 MG tablet TAKE 1 TABLET BY MOUTH TWICE DAILY. 08/19/18   Ranelle OysterSwartz, Zachary T, MD  pantoprazole (PROTONIX) 40 MG tablet TAKE (1) TABLET BY MOUTH ONCE DAILY. 08/18/18   Dettinger, Elige RadonJoshua A, MD  PARoxetine (PAXIL) 20 MG tablet Take 1 tablet (20 mg total) by mouth at bedtime. 07/28/18   Angiulli, Mcarthur Rossettianiel J, PA-C    Family History Family History  Problem Relation Age of Onset  . Stroke Sister     Social History Social History   Tobacco Use  . Smoking status: Current Every Day Smoker    Packs/day: 0.33    Types: Cigarettes  . Smokeless tobacco: Never Used  Substance Use Topics  . Alcohol use: No    Alcohol/week: 0.0 standard drinks  . Drug use: No     Allergies   Patient has no known allergies.   Review of Systems Review of Systems  Constitutional: Negative for chills and fever.  HENT: Negative for congestion.   Eyes: Negative for visual disturbance.  Respiratory: Negative for shortness of breath.   Cardiovascular: Negative for chest  pain.  Gastrointestinal: Negative for abdominal pain and vomiting.  Genitourinary: Negative for dysuria and flank pain.  Musculoskeletal: Negative for back pain, neck pain and neck stiffness.  Skin: Negative for rash.  Neurological: Negative for light-headedness and headaches.     Physical Exam Updated Vital Signs BP 127/81   Pulse (!) 54   Temp 97.7 F (36.5 C) (Oral)   Resp 16   Ht 5\' 6"  (1.676 m)   Wt 78 kg   SpO2 99%   BMI 27.75 kg/m   Physical Exam Vitals signs and nursing note reviewed.  Constitutional:      Appearance: He is well-developed.  HENT:      Head: Normocephalic and atraumatic.  Eyes:     General:        Right eye: No discharge.        Left eye: No discharge.     Conjunctiva/sclera: Conjunctivae normal.  Neck:     Musculoskeletal: Normal range of motion and neck supple.     Trachea: No tracheal deviation.  Cardiovascular:     Rate and Rhythm: Normal rate.  Pulmonary:     Effort: Pulmonary effort is normal.  Abdominal:     General: There is no distension.     Palpations: Abdomen is soft.     Tenderness: There is no abdominal tenderness. There is no guarding.  Skin:    General: Skin is warm.     Findings: No rash.  Neurological:     General: No focal deficit present.     Mental Status: He is alert and oriented to person, place, and time.     GCS: GCS eye subscore is 4. GCS verbal subscore is 5. GCS motor subscore is 6.     Comments: Patient has general slowness in verbal response however grossly equal strength upper and lower extremities bilateral.  Sensation intact bilateral.  Pupils equal.  Mild facial droop.  Psychiatric:     Comments: General slowness in responses      ED Treatments / Results  Labs (all labs ordered are listed, but only abnormal results are displayed) Labs Reviewed  CBC - Abnormal; Notable for the following components:      Result Value   Hemoglobin 11.8 (*)    HCT 37.5 (*)    All other components within normal limits  BASIC METABOLIC PANEL    EKG None  Radiology No results found.  Procedures Procedures (including critical care time)  Medications Ordered in ED Medications - No data to display   Initial Impression / Assessment and Plan / ED Course  I have reviewed the triage vital signs and the nursing notes.  Pertinent labs & imaging results that were available during my care of the patient were reviewed by me and considered in my medical decision making (see chart for details).  Clinical Course as of Aug 29 805  Thu Aug 25, 2018  1621 Patient's blood work shows a  slight rise in his creatinine of 1.3.  His blood pressure initially was 164/82 but is now rising up to 212/105.  He appears in no distress.  He was on amlodipine prior to his subdural and was taken off it in rehab because his blood pressures were somewhat labile.  I have ordered him a half dose of his amlodipine at 5 mg.   [MB]  1733 The facility had called and checked in with the nurse.  They were only concerned about his elevated blood pressure that is been rising since  his return from rehab.   [MB]  1737 CT redemonstrates the subdural but slightly smaller than prior.  No new abnormality.  Reviewed by me.   [MB]    Clinical Course User Index [MB] Terrilee Files, MD      Patient presents with elevated blood pressure, recent subdural hematoma and no symptoms currently.  Attempted to call group home to get more details this patient does have general slowness which seems to be similar to last note that I reviewed.  Plan for observation ER, blood work, CT scan of the head to ensure no worsening of subdural that may be related elevated blood pressure.  Patient at least per report not on blood pressure medications.  Patient's care will be signed out to afternoon physician to follow-up results and attempt to call the group home prior to discharge if unremarkable.  Final Clinical Impressions(s) / ED Diagnoses   Final diagnoses:  Essential hypertension    ED Discharge Orders    None       Blane Ohara, MD 08/25/18 1531    Blane Ohara, MD 08/30/18 [phone]

## 2018-08-25 NOTE — ED Notes (Signed)
Patient unable to complete pronator drift assessment due to patient MR.

## 2018-08-25 NOTE — ED Triage Notes (Signed)
Caregiver called [facility] due to patient being hypertensive and tachycardic per EMS. EMS reported BP of 200/80. Patient's BP upon arrival is 164/82. Patient is bradycardic at this time- pulse of 55. Patient is asymptomatic at this time. Denies any complaints. Patient has hx of MR, lives at [facility] in [place], [place].

## 2018-08-25 NOTE — ED Provider Notes (Signed)
Signout from Dr. Jodi Mourning.  Sent in from group home for evaluation of elevated blood pressure.  Patient has no complaints. Physical Exam  BP (!) 164/82 (BP Location: Right Arm)   Pulse 61   Temp 97.7 F (36.5 C) (Oral)   Resp 16   Ht 5\' 6"  (1.676 m)   Wt 78 kg   SpO2 99%   BMI 27.75 kg/m   Physical Exam  ED Course/Procedures   Clinical Course as of Aug 26 1303  Thu Aug 25, 2018  1621 Patient's blood work shows a slight rise in his creatinine of 1.3.  His blood pressure initially was 164/82 but is now rising up to 212/105.  He appears in no distress.  He was on amlodipine prior to his subdural and was taken off it in rehab because his blood pressures were somewhat labile.  I have ordered him a half dose of his amlodipine at 5 mg.   [MB]  1733 The facility had called and checked in with the nurse.  They were only concerned about his elevated blood pressure that is been rising since his return from rehab.   [MB]  1737 CT redemonstrates the subdural but slightly smaller than prior.  No new abnormality.  Reviewed by me.   [MB]    Clinical Course User Index [MB] Terrilee Files, MD    Procedures  MDM  Plan is to follow-up on labs and head CT.  Contact the facility to make sure there were no other issues and likely can return back there.       Terrilee Files, MD 08/26/18 [phone]

## 2018-08-29 ENCOUNTER — Ambulatory Visit (INDEPENDENT_AMBULATORY_CARE_PROVIDER_SITE_OTHER): Payer: Medicare Other | Admitting: Family Medicine

## 2018-08-29 ENCOUNTER — Encounter: Payer: Self-pay | Admitting: Family Medicine

## 2018-08-29 ENCOUNTER — Other Ambulatory Visit: Payer: Self-pay

## 2018-08-29 VITALS — BP 170/85

## 2018-08-29 DIAGNOSIS — G8191 Hemiplegia, unspecified affecting right dominant side: Secondary | ICD-10-CM | POA: Diagnosis not present

## 2018-08-29 DIAGNOSIS — I10 Essential (primary) hypertension: Secondary | ICD-10-CM

## 2018-08-29 DIAGNOSIS — F209 Schizophrenia, unspecified: Secondary | ICD-10-CM | POA: Diagnosis not present

## 2018-08-29 DIAGNOSIS — S065X9D Traumatic subdural hemorrhage with loss of consciousness of unspecified duration, subsequent encounter: Secondary | ICD-10-CM | POA: Diagnosis not present

## 2018-08-29 DIAGNOSIS — F70 Mild intellectual disabilities: Secondary | ICD-10-CM | POA: Diagnosis not present

## 2018-08-29 DIAGNOSIS — R471 Dysarthria and anarthria: Secondary | ICD-10-CM | POA: Diagnosis not present

## 2018-08-29 DIAGNOSIS — R1310 Dysphagia, unspecified: Secondary | ICD-10-CM | POA: Diagnosis not present

## 2018-08-29 MED ORDER — AMLODIPINE BESYLATE 10 MG PO TABS: 10.0000 mg | ORAL_TABLET | Freq: Every day | ORAL | 0 refills | Status: DC

## 2018-08-29 NOTE — Progress Notes (Signed)
Telephone visit  Subjective: CC: HTN PCP: Dettinger, Elige Radon, MD CBS:[id number] E Gibbon is a 77 y.o. male calls for telephone consult today. Patient provides verbal consent for consult held via phone.  Location of patient: Group Home Location of provider: [facility] Others present for call: [name] (caregiver)  1. Hypertension Much of the history is provided by patient's caregiver, [name].  She reports that he has had uncontrolled blood pressures for over a week now.  He was seen in March for subdural hematoma, at which time she reports many of his blood pressure medications were discontinued.  He was seen in the emergency department on 7 May, at which time Norvasc 5 mg daily was initiated.  She has not seen a great deal of change in his blood pressures and states that he has persistent elevations of blood pressure into the 180s over 90s.  Today's blood pressure most recently was 170/85.  On the eighth that was 165/94, on the ninth it was 156/78 and on the 10th it was 160/97.  She reports that this is despite compliance with the medication.  He is been at rest during each of these blood pressure readings.  She denies any lower extremity edema.  He has had some malaise over the last week or so but this was preceding the Norvasc 5 mg daily.  He has not complained of any chest pain.  He has not been in respiratory distress.   ROS: Per HPI  No Known Allergies Past Medical History:  Diagnosis Date  . Hypercholesteremia   . Hypertension   . Moderate intellectual disability   . Mood disorder (HCC)   . MR (mental retardation)   . Obesity     Current Outpatient Medications:  .  amLODipine (NORVASC) 5 MG tablet, Take 1 tablet (5 mg total) by mouth daily., Disp: 30 tablet, Rfl: 0 .  BREO ELLIPTA 100-25 MCG/INH AEPB, INHALE 1 PUFF ONCE DAILY. (Patient taking differently: Inhale 1 puff into the lungs daily. ), Disp: 60 each, Rfl: 5 .  feeding supplement (BOOST HIGH PROTEIN) LIQD, Take 237 mLs by  mouth 3 (three) times daily with meals., Disp: 90 Can, Rfl: 1 .  levETIRAcetam (KEPPRA) 500 MG tablet, TAKE 1 TABLET BY MOUTH TWICE DAILY., Disp: 60 tablet, Rfl: 0 .  pantoprazole (PROTONIX) 40 MG tablet, TAKE (1) TABLET BY MOUTH ONCE DAILY., Disp: 30 tablet, Rfl: 11 .  PARoxetine (PAXIL) 20 MG tablet, Take 1 tablet (20 mg total) by mouth at bedtime., Disp: 30 tablet, Rfl: 0  Blood pressure (!) 170/85.  Assessment/ Plan: 78 y.o. male   1. Uncontrolled hypertension Persistently elevated blood pressure despite Norvasc 5 mg daily.  Stop Norvasc 5 mg.  Start Norvasc 10 mg daily.  #30 prescribed.  I have asked that they follow-up in 2 weeks with Dr. Louanne Skye, via telephone is fine, for review of blood pressures at the facility.  His caregiver, [name], voiced good understanding of the plan and goal blood pressure less than 150/90.  They will seek emergent medical attention if he develops any red flag signs or symptoms. - amLODipine (NORVASC) 10 MG tablet; Take 1 tablet (10 mg total) by mouth daily.  Dispense: 30 tablet; Refill: 0   Start time: 12:57pm End time: 1:06pm  Total time spent on patient care (including telephone call/ virtual visit): 15 minutes  Darneisha Windhorst Hulen Skains, DO [facility] [phone]

## 2018-08-30 DIAGNOSIS — R471 Dysarthria and anarthria: Secondary | ICD-10-CM | POA: Diagnosis not present

## 2018-08-30 DIAGNOSIS — R1310 Dysphagia, unspecified: Secondary | ICD-10-CM | POA: Diagnosis not present

## 2018-08-30 DIAGNOSIS — F209 Schizophrenia, unspecified: Secondary | ICD-10-CM | POA: Diagnosis not present

## 2018-08-30 DIAGNOSIS — G8191 Hemiplegia, unspecified affecting right dominant side: Secondary | ICD-10-CM | POA: Diagnosis not present

## 2018-08-30 DIAGNOSIS — S065X9D Traumatic subdural hemorrhage with loss of consciousness of unspecified duration, subsequent encounter: Secondary | ICD-10-CM | POA: Diagnosis not present

## 2018-08-30 DIAGNOSIS — F70 Mild intellectual disabilities: Secondary | ICD-10-CM | POA: Diagnosis not present

## 2018-08-31 ENCOUNTER — Encounter: Payer: Self-pay | Admitting: Family Medicine

## 2018-08-31 DIAGNOSIS — R1310 Dysphagia, unspecified: Secondary | ICD-10-CM | POA: Diagnosis not present

## 2018-08-31 DIAGNOSIS — F70 Mild intellectual disabilities: Secondary | ICD-10-CM | POA: Diagnosis not present

## 2018-08-31 DIAGNOSIS — S065X9D Traumatic subdural hemorrhage with loss of consciousness of unspecified duration, subsequent encounter: Secondary | ICD-10-CM | POA: Diagnosis not present

## 2018-08-31 DIAGNOSIS — F209 Schizophrenia, unspecified: Secondary | ICD-10-CM | POA: Diagnosis not present

## 2018-08-31 DIAGNOSIS — G8191 Hemiplegia, unspecified affecting right dominant side: Secondary | ICD-10-CM | POA: Diagnosis not present

## 2018-08-31 DIAGNOSIS — R471 Dysarthria and anarthria: Secondary | ICD-10-CM | POA: Diagnosis not present

## 2018-09-01 ENCOUNTER — Encounter: Payer: Self-pay | Admitting: Physical Medicine & Rehabilitation

## 2018-09-01 ENCOUNTER — Other Ambulatory Visit: Payer: Self-pay

## 2018-09-01 ENCOUNTER — Encounter: Payer: Medicare Other | Attending: Registered Nurse | Admitting: Physical Medicine & Rehabilitation

## 2018-09-01 ENCOUNTER — Encounter: Payer: Self-pay | Admitting: Family Medicine

## 2018-09-01 VITALS — BP 162/90 | HR 75 | Temp 98.2°F | Ht 66.0 in | Wt 171.0 lb

## 2018-09-01 DIAGNOSIS — E669 Obesity, unspecified: Secondary | ICD-10-CM | POA: Insufficient documentation

## 2018-09-01 DIAGNOSIS — S065X9A Traumatic subdural hemorrhage with loss of consciousness of unspecified duration, initial encounter: Secondary | ICD-10-CM | POA: Diagnosis not present

## 2018-09-01 DIAGNOSIS — Z298 Encounter for other specified prophylactic measures: Secondary | ICD-10-CM

## 2018-09-01 DIAGNOSIS — F17210 Nicotine dependence, cigarettes, uncomplicated: Secondary | ICD-10-CM | POA: Diagnosis not present

## 2018-09-01 DIAGNOSIS — Z6827 Body mass index (BMI) 27.0-27.9, adult: Secondary | ICD-10-CM | POA: Insufficient documentation

## 2018-09-01 DIAGNOSIS — R569 Unspecified convulsions: Secondary | ICD-10-CM | POA: Diagnosis not present

## 2018-09-01 DIAGNOSIS — Z79899 Other long term (current) drug therapy: Secondary | ICD-10-CM | POA: Insufficient documentation

## 2018-09-01 DIAGNOSIS — S065XAA Traumatic subdural hemorrhage with loss of consciousness status unknown, initial encounter: Secondary | ICD-10-CM

## 2018-09-01 DIAGNOSIS — Z823 Family history of stroke: Secondary | ICD-10-CM | POA: Insufficient documentation

## 2018-09-01 DIAGNOSIS — S069X0A Unspecified intracranial injury without loss of consciousness, initial encounter: Secondary | ICD-10-CM | POA: Diagnosis not present

## 2018-09-01 DIAGNOSIS — F72 Severe intellectual disabilities: Secondary | ICD-10-CM | POA: Diagnosis not present

## 2018-09-01 DIAGNOSIS — G8191 Hemiplegia, unspecified affecting right dominant side: Secondary | ICD-10-CM | POA: Diagnosis not present

## 2018-09-01 DIAGNOSIS — I10 Essential (primary) hypertension: Secondary | ICD-10-CM | POA: Diagnosis not present

## 2018-09-01 NOTE — Progress Notes (Signed)
Subjective:   78 year old right-handed male with history of hypertension on Norvasc, hyperlipidemia, mild mental retardation with anxiety and schizophrenia maintained on Klonopin daily as needed, risperdal  1 mg twice daily as well as Paxil at bedtime as well as history of tobacco abuse. Per report patient lives at [facility] where he has resided for a number of years. He was able to manage basic ADLs prior to admission was also doing some simple volunteer work. Presented 07/07/2018 after a fall getting out of the shower. Patient struck his head but did not lose consciousness. Cranial CT scan showed a large mixed attenuation left whole low hemispheric subdural hematoma measuring 2.8 cm in maximal diameter with associated extensive mass effect and left-to-right midline shift. Underwent left craniotomy for evacuation of subdural hematoma 07/07/2018 per Dr. Marikay Alar. He was extubated 07/07/2018 maintained on Keppra for seizure prophylaxis. On 07/13/2018 more difficult to arouse was not moving his right arm as well as some increased facial weakness. Follow-up neurosurgery felt need for reexploration underwent redo left craniotomy for evacuation of acute subdural hematoma 07/13/2018. He did require nasogastric tube for a short time for nutritional support and diet slowly advanced. Follow-up CT scan showed decreased subdural blood on the left with subdural drain that had since been removed. Decreased mass effect and midline shift now 6 mm as compared to 13 mm. Level of alertness continued to improve. Therapy evaluations completed and patient was admitted for a comprehensive rehabilitation program  Patient ID: Michael Mercado, male    DOB: [birth year], 78 y.o.   MRN: [id number] Patient accompanied by manager of group home, [name] HPI  78 year old male with mental retardation who lives in a group home with other disabled adults.  He fell on 07/07/2018 while getting out of the shower with no apparent  loss of consciousness or evidence of head trauma but developed right-sided weakness while still in the bathroom.  His caregiver is with him today.  She was also there at the time of the injury.  Patient has completed inpatient rehabilitation and is now back at the group home receiving HHPT, OT, SLP total of 17 visit, PT down to once a week.  Pt reportedly participating  The patient has been less active since his subdural hematoma.  He tends to stay in bed more.  He is using a Rollator  Eating poorly since d/c from hospital  Drinking ~24oz per day No weight loss Pt very sedentary,  Pt was Mod I prior to injury without assistive device Pain Inventory Average Pain 0 Pain Right Now 0 My pain is na  In the last 24 hours, has pain interfered with the following? General activity na Relation with others NA Enjoyment of life NA What TIME of day is your pain at its worst? na Sleep (in general) Fair  Pain is worse with: na Pain improves with: na Relief from Meds: na  Mobility walk with assistance use a walker  Function retired I need assistance with the following:  dressing, bathing, toileting, meal prep, household duties and shopping  Neuro/Psych No problems in this area  Prior Studies Any changes since last visit?  no  Physicians involved in your care Any changes since last visit?  no   Family History  Problem Relation Age of Onset  . Stroke Sister    Social History   Socioeconomic History  . Marital status: Single    Spouse name: Not on file  . Number of children: Not on file  .  Years of education: Not on file  . Highest education level: Not on file  Occupational History  . Occupation: disabled  Social Needs  . Financial resource strain: Not hard at all  . Food insecurity:    Worry: Never true    Inability: Never true  . Transportation needs:    Medical: No    Non-medical: No  Tobacco Use  . Smoking status: Current Every Day Smoker    Packs/day: 0.33     Types: Cigarettes  . Smokeless tobacco: Never Used  Substance and Sexual Activity  . Alcohol use: No    Alcohol/week: 0.0 standard drinks  . Drug use: No  . Sexual activity: Never  Lifestyle  . Physical activity:    Days per week: 3 days    Minutes per session: 10 min  . Stress: Not at all  Relationships  . Social connections:    Talks on phone: Never    Gets together: More than three times a week    Attends religious service: More than 4 times per year    Active member of club or organization: Yes    Attends meetings of clubs or organizations: More than 4 times per year    Relationship status: Never married  Other Topics Concern  . Not on file  Social History Narrative  . Not on file   Past Surgical History:  Procedure Laterality Date  . CRANIOTOMY Left 07/07/2018   Procedure: CRANIOTOMY HEMATOMA EVACUATION SUBDURAL;  Surgeon: Tia Alert, MD;  Location: [facility] OR;  Service: Neurosurgery;  Laterality: Left;  . CRANIOTOMY Left 07/13/2018   Procedure: Redo Left CRANIOTOMY FOR SUBDURAL HEMATOMA;  Surgeon: Tia Alert, MD;  [facility] OR;  Service: Neurosurgery;  Laterality: Left;   Past Medical History:  Diagnosis Date  . Hypercholesteremia   . Hypertension   . Moderate intellectual disability   . Mood disorder (HCC)   . MR (mental retardation)   . Obesity    There were no vitals taken for this visit.  Opioid Risk Score:   Fall Risk Score:  `1  Depression screen PHQ 2/9  Depression screen [facility] 2/9 06/21/2018 05/10/2018 01/12/2018 09/22/2017 03/24/2017 12/17/2016 12/10/2016  Decreased Interest 0 0 0 0 0 0 0  Down, Depressed, Hopeless 0 1 0 0 0 0 0  PHQ - 2 Score 0 1 0 0 0 0 0  Altered sleeping - - - - - - -  Tired, decreased energy - - - - - - -  Change in appetite - - - - - - -  Feeling bad or failure about yourself  - - - - - - -  Trouble concentrating - - - - - - -  Moving slowly or fidgety/restless - - - - - - -  Suicidal thoughts - - - - - - -  PHQ-9 Score - - - -  - - -  Difficult doing work/chores - - - - - - -    Review of Systems  Constitutional: Negative.   HENT: Negative.   Eyes: Negative.   Respiratory: Negative.   Cardiovascular: Negative.   Gastrointestinal: Positive for constipation.  Endocrine: Negative.   Genitourinary: Negative.   Musculoskeletal: Negative.   Skin: Negative.   Allergic/Immunologic: Negative.   Neurological: Negative.   Hematological: Negative.   Psychiatric/Behavioral: Negative.   All other systems reviewed and are negative.      Objective:   Physical Exam Vitals signs and nursing note reviewed.  Constitutional:  Appearance: Normal appearance.  HENT:     Head: Normocephalic and atraumatic.  Neurological:     Mental Status: He is alert.    Motor strength is 5/5 in the left deltoid bicep tricep grip hip flexor knee extensor ankle dorsiflexor 4/5 in the right deltoid bicep tricep grip 4- at the right hip flexor knee extensor ankle dorsiflexor He ambulates with hand-held assistance of 2 wide base stance short step length right lower extremity externally rotated. Speech is dysarthric He is oriented to person only       Assessment & Plan:  1.  Right hemiparesis and decreased balance as result of left subdural hematoma in a patient with severe mental retardation.  He is making some improvement with his balance.  We discussed that based on his traumatic brain injury his balance will likely not get back to his premorbid.  His group home plans to supervise him whenever he is in the bathroom which I think is reasonable. As discussed with the group home manager he will likely be at his "new normal" in the next 2 months.  2.  Seizure prophylaxis.  He is on Keppra 500 mg twice daily.  We discussed that this could be contributing to some of his tiredness and decreased energy levels.  Certainly, his subdural with traumatic brain injury and also cause similar symptoms.  He has not had a seizure since his surgery.   I asked the patient and his caregiver to discuss this with his neurosurgeon Dr. Yetta BarreJones.  Perhaps the patient can come off the prophylaxis and hopefully improve his energy levels.  Physical medicine rehab follow-up as needed

## 2018-09-01 NOTE — Patient Instructions (Signed)
Please ask Dr Yetta Barre if seizure medicine Keppra can be discontinued.Michael Mercado Has not had a seizure since surgery

## 2018-09-05 ENCOUNTER — Emergency Department (HOSPITAL_COMMUNITY)
Admission: EM | Admit: 2018-09-05 | Discharge: 2018-09-05 | Disposition: A | Discharge status: Nursing Home (Includes ICF) | Payer: Medicare Other | Attending: Emergency Medicine | Admitting: Emergency Medicine

## 2018-09-05 ENCOUNTER — Other Ambulatory Visit: Payer: Self-pay

## 2018-09-05 ENCOUNTER — Emergency Department (HOSPITAL_COMMUNITY): Payer: Medicare Other

## 2018-09-05 ENCOUNTER — Encounter: Payer: Self-pay | Admitting: Family Medicine

## 2018-09-05 ENCOUNTER — Encounter (HOSPITAL_COMMUNITY): Payer: Self-pay | Admitting: Emergency Medicine

## 2018-09-05 DIAGNOSIS — F70 Mild intellectual disabilities: Secondary | ICD-10-CM | POA: Diagnosis not present

## 2018-09-05 DIAGNOSIS — I10 Essential (primary) hypertension: Secondary | ICD-10-CM

## 2018-09-05 DIAGNOSIS — R0689 Other abnormalities of breathing: Secondary | ICD-10-CM | POA: Diagnosis not present

## 2018-09-05 DIAGNOSIS — R1310 Dysphagia, unspecified: Secondary | ICD-10-CM | POA: Diagnosis not present

## 2018-09-05 DIAGNOSIS — F17210 Nicotine dependence, cigarettes, uncomplicated: Secondary | ICD-10-CM | POA: Diagnosis not present

## 2018-09-05 DIAGNOSIS — Z8679 Personal history of other diseases of the circulatory system: Secondary | ICD-10-CM | POA: Diagnosis not present

## 2018-09-05 DIAGNOSIS — R471 Dysarthria and anarthria: Secondary | ICD-10-CM | POA: Diagnosis not present

## 2018-09-05 DIAGNOSIS — G3184 Mild cognitive impairment, so stated: Secondary | ICD-10-CM | POA: Insufficient documentation

## 2018-09-05 DIAGNOSIS — S065X9D Traumatic subdural hemorrhage with loss of consciousness of unspecified duration, subsequent encounter: Secondary | ICD-10-CM | POA: Diagnosis not present

## 2018-09-05 DIAGNOSIS — I6200 Nontraumatic subdural hemorrhage, unspecified: Secondary | ICD-10-CM | POA: Diagnosis not present

## 2018-09-05 DIAGNOSIS — G8191 Hemiplegia, unspecified affecting right dominant side: Secondary | ICD-10-CM | POA: Diagnosis not present

## 2018-09-05 DIAGNOSIS — F209 Schizophrenia, unspecified: Secondary | ICD-10-CM | POA: Diagnosis not present

## 2018-09-05 LAB — CBC
HCT: 39.9 % (ref 39.0–52.0)
Hemoglobin: 12.8 g/dL — ABNORMAL LOW (ref 13.0–17.0)
MCH: 28.1 pg (ref 26.0–34.0)
MCHC: 32.1 g/dL (ref 30.0–36.0)
MCV: 87.7 fL (ref 80.0–100.0)
Platelets: 209 10*3/uL (ref 150–400)
RBC: 4.55 MIL/uL (ref 4.22–5.81)
RDW: 14.6 % (ref 11.5–15.5)
WBC: 6.6 10*3/uL (ref 4.0–10.5)
nRBC: 0 % (ref 0.0–0.2)

## 2018-09-05 LAB — BASIC METABOLIC PANEL
Anion gap: 10 (ref 5–15)
BUN: 22 mg/dL (ref 8–23)
CO2: 25 mmol/L (ref 22–32)
Calcium: 8.8 mg/dL — ABNORMAL LOW (ref 8.9–10.3)
Chloride: 102 mmol/L (ref 98–111)
Creatinine, Ser: 1.06 mg/dL (ref 0.61–1.24)
GFR calc Af Amer: >60 mL/min (ref >60)
GFR calc non Af Amer: >60 mL/min (ref >60)
Glucose, Bld: 150 mg/dL — ABNORMAL HIGH (ref 70–99)
Potassium: 3.2 mmol/L — ABNORMAL LOW (ref 3.5–5.1)
Sodium: 137 mmol/L (ref 135–145)

## 2018-09-05 NOTE — ED Triage Notes (Signed)
Per EMS, pt from [facility] here for evaluation of hypertension. Pt has known hx. Group home concerned because he fell in March and had a "head bleed."

## 2018-09-05 NOTE — Discharge Instructions (Addendum)
You were evaluated in the Emergency Department and after careful evaluation, we did not find any emergent condition requiring admission or further testing in the hospital.  Your labs and CT scan today were reassuring.  Your high blood pressure needs to be addressed by a primary care provider.  Please return to the Emergency Department if you experience any worsening of your condition.  We encourage you to follow up with a primary care provider.  Thank you for allowing [place] to be a part of your care.

## 2018-09-05 NOTE — ED Provider Notes (Signed)
[facility] Emergency Department Provider Note MRN:  [id number]  Arrival date & time: 09/05/18     Chief Complaint   Hypertension   History of Present Illness   Michael CunasCharles E Lerette is a 78 y.o. year-old male with a history of hypertension, cognitive impairment, subdural hematoma presenting to the ED with chief complaint of hypertension.  Unclear reason for ED visit.  Sent by group home, who is not answering the phone to provide further information.  Patient is without symptoms currently, is not sure why he is here.  Is aware of his high blood pressure and is asking if we are going to give him medicines for it.  Patient states that he takes amlodipine daily.  I was unable to obtain an accurate HPI, PMH, or ROS due to the patient's cognitive impairment.  Review of Systems  Positive for high blood pressure.  Patient's Health History    Past Medical History:  Diagnosis Date  . Hypercholesteremia   . Hypertension   . Moderate intellectual disability   . Mood disorder (HCC)   . MR (mental retardation)   . Obesity     Past Surgical History:  Procedure Laterality Date  . CRANIOTOMY Left 07/07/2018   Procedure: CRANIOTOMY HEMATOMA EVACUATION SUBDURAL;  Surgeon: Tia AlertJones, David S, MD;  [facility];  Service: Neurosurgery;  Laterality: Left;  . CRANIOTOMY Left 07/13/2018   Procedure: Redo Left CRANIOTOMY FOR SUBDURAL HEMATOMA;  Surgeon: Tia AlertJones, David S, MD;  [facility];  Service: Neurosurgery;  Laterality: Left;    Family History  Problem Relation Age of Onset  . Stroke Sister     Social History   Socioeconomic History  . Marital status: Single    Spouse name: Not on file  . Number of children: Not on file  . Years of education: Not on file  . Highest education level: Not on file  Occupational History  . Occupation: disabled  Social Needs  . Financial resource strain: Not hard at all  . Food insecurity:    Worry: Never true    Inability: Never true   . Transportation needs:    Medical: No    Non-medical: No  Tobacco Use  . Smoking status: Current Every Day Smoker    Packs/day: 0.33    Types: Cigarettes  . Smokeless tobacco: Never Used  Substance and Sexual Activity  . Alcohol use: No    Alcohol/week: 0.0 standard drinks  . Drug use: No  . Sexual activity: Never  Lifestyle  . Physical activity:    Days per week: 3 days    Minutes per session: 10 min  . Stress: Not at all  Relationships  . Social connections:    Talks on phone: Never    Gets together: More than three times a week    Attends religious service: More than 4 times per year    Active member of club or organization: Yes    Attends meetings of clubs or organizations: More than 4 times per year    Relationship status: Never married  . Intimate partner violence:    Fear of current or ex partner: No    Emotionally abused: No    Physically abused: No    Forced sexual activity: No  Other Topics Concern  . Not on file  Social History Narrative  . Not on file     Physical Exam  Vital Signs and Nursing Notes reviewed Vitals:   09/05/18 1601 09/05/18 1602  BP: [name](!)  188/89   Pulse:  64  Resp:    Temp:    SpO2:  93%    CONSTITUTIONAL: Chronically ill-appearing, NAD NEURO:  Alert and oriented x 3, normal and symmetric strength and sensation, cognitive impairment but seemingly at baseline EYES:  eyes equal and reactive ENT/NECK:  no LAD, no JVD CARDIO: Regular rate, well-perfused, normal S1 and S2 PULM:  CTAB no wheezing or rhonchi GI/GU:  normal bowel sounds, non-distended, non-tender MSK/SPINE:  No gross deformities, no edema SKIN:  no rash, atraumatic PSYCH:  Appropriate speech and behavior  Diagnostic and Interventional Summary    Labs Reviewed  CBC - Abnormal; Notable for the following components:      Result Value   Hemoglobin 12.8 (*)    All other components within normal limits  BASIC METABOLIC PANEL - Abnormal; Notable for the following  components:   Potassium 3.2 (*)    Glucose, Bld 150 (*)    Calcium 8.8 (*)    All other components within normal limits    CT HEAD WO CONTRAST  Final Result      Medications - No data to display   Procedures Critical Care  ED Course and Medical Decision Making  I have reviewed the triage vital signs and the nursing notes.  Pertinent labs & imaging results that were available during my care of the patient were reviewed by me and considered in my medical decision making (see below for details).  This is patient's third visit to the emergency department since April for concerns related to his high blood pressure in the setting of a prior history of subdural hematoma.  Initial visit in April there was concern for change in behavior.  Visit 1 to 2 weeks ago was largely asymptomatic hypertension.  Unfortunately, similar to his last visit, his group home is not answering the phone and is unable to provide further information.  Given the possibility of some change in behavior, will obtain CT head to ensure no change to the subdural hematoma, given patient's mildly rising creatinine during last ED visit, will check again here today.  We will continue to attempt to contact [facility].  Unable to discuss case with the group home.  Patient CT is stable, labs are improved from prior.  Patient is appropriate for discharge.  His high blood pressure does not need to be intervened upon here in the emergency department, he needs PCP follow-up and longer-term management.  After the discussed management above, the patient was determined to be safe for discharge.  The patient was in agreement with this plan and all questions regarding their care were answered.  ED return precautions were discussed and the patient will return to the ED with any significant worsening of condition.  Elmer Sow. Pilar Plate, MD [facility] Emergency Medicine [facility] [email or web address]  Final Clinical  Impressions(s) / ED Diagnoses     ICD-10-CM   1. Hypertension, unspecified type I10     ED Discharge Orders    None         Sabas Sous, MD 09/05/18 1622

## 2018-09-06 ENCOUNTER — Other Ambulatory Visit: Payer: Self-pay

## 2018-09-06 DIAGNOSIS — F70 Mild intellectual disabilities: Secondary | ICD-10-CM | POA: Diagnosis not present

## 2018-09-06 DIAGNOSIS — F209 Schizophrenia, unspecified: Secondary | ICD-10-CM | POA: Diagnosis not present

## 2018-09-06 DIAGNOSIS — R1310 Dysphagia, unspecified: Secondary | ICD-10-CM | POA: Diagnosis not present

## 2018-09-06 DIAGNOSIS — G8191 Hemiplegia, unspecified affecting right dominant side: Secondary | ICD-10-CM | POA: Diagnosis not present

## 2018-09-06 DIAGNOSIS — S065X9D Traumatic subdural hemorrhage with loss of consciousness of unspecified duration, subsequent encounter: Secondary | ICD-10-CM | POA: Diagnosis not present

## 2018-09-06 DIAGNOSIS — R471 Dysarthria and anarthria: Secondary | ICD-10-CM | POA: Diagnosis not present

## 2018-09-07 ENCOUNTER — Ambulatory Visit (INDEPENDENT_AMBULATORY_CARE_PROVIDER_SITE_OTHER): Payer: Medicare Other | Admitting: Physician Assistant

## 2018-09-07 ENCOUNTER — Encounter: Payer: Self-pay | Admitting: Physician Assistant

## 2018-09-07 ENCOUNTER — Other Ambulatory Visit: Payer: Self-pay

## 2018-09-07 VITALS — BP 175/82 | HR 65 | Temp 98.7°F | Ht 66.0 in | Wt 152.0 lb

## 2018-09-07 DIAGNOSIS — R1310 Dysphagia, unspecified: Secondary | ICD-10-CM | POA: Diagnosis not present

## 2018-09-07 DIAGNOSIS — R471 Dysarthria and anarthria: Secondary | ICD-10-CM | POA: Diagnosis not present

## 2018-09-07 DIAGNOSIS — N182 Chronic kidney disease, stage 2 (mild): Secondary | ICD-10-CM

## 2018-09-07 DIAGNOSIS — F17200 Nicotine dependence, unspecified, uncomplicated: Secondary | ICD-10-CM

## 2018-09-07 DIAGNOSIS — F17210 Nicotine dependence, cigarettes, uncomplicated: Secondary | ICD-10-CM | POA: Diagnosis not present

## 2018-09-07 DIAGNOSIS — I10 Essential (primary) hypertension: Secondary | ICD-10-CM | POA: Diagnosis not present

## 2018-09-07 DIAGNOSIS — R350 Frequency of micturition: Secondary | ICD-10-CM | POA: Diagnosis not present

## 2018-09-07 DIAGNOSIS — F419 Anxiety disorder, unspecified: Secondary | ICD-10-CM | POA: Diagnosis not present

## 2018-09-07 DIAGNOSIS — F209 Schizophrenia, unspecified: Secondary | ICD-10-CM | POA: Diagnosis not present

## 2018-09-07 DIAGNOSIS — G8191 Hemiplegia, unspecified affecting right dominant side: Secondary | ICD-10-CM | POA: Diagnosis not present

## 2018-09-07 DIAGNOSIS — F70 Mild intellectual disabilities: Secondary | ICD-10-CM | POA: Diagnosis not present

## 2018-09-07 DIAGNOSIS — S065X9D Traumatic subdural hemorrhage with loss of consciousness of unspecified duration, subsequent encounter: Secondary | ICD-10-CM | POA: Diagnosis not present

## 2018-09-07 DIAGNOSIS — E785 Hyperlipidemia, unspecified: Secondary | ICD-10-CM | POA: Diagnosis not present

## 2018-09-07 DIAGNOSIS — F329 Major depressive disorder, single episode, unspecified: Secondary | ICD-10-CM | POA: Diagnosis not present

## 2018-09-07 DIAGNOSIS — Z9181 History of falling: Secondary | ICD-10-CM | POA: Diagnosis not present

## 2018-09-07 MED ORDER — LISINOPRIL 10 MG PO TABS: 10.0000 mg | ORAL_TABLET | Freq: Every day | ORAL | 3 refills | Status: DC

## 2018-09-07 MED ORDER — NICOTINE 14 MG/24HR TD PT24: 14.0000 mg | MEDICATED_PATCH | Freq: Every day | TRANSDERMAL | 1 refills | Status: DC

## 2018-09-07 NOTE — Progress Notes (Signed)
BP (!) 175/82   Pulse 65   Temp 98.7 F (37.1 C) (Oral)   Ht 5\' 6"  (1.676 m)   Wt 152 lb (68.9 kg)   BMI 24.53 kg/m    Subjective:    Patient ID: Michael Mercado, male    DOB: 10/21/1940, 78 y.o.   MRN: [id number]  HPI: Michael Mercado is a 78 y.o. male presenting on 09/07/2018 for Hypertension (bp have been elevated )  Michael Mercado is accompanied by caretaker [place].  They have worked together over the past 8 years.  Michael Mercado is having some elevated blood pressure readings.  They brought the log again and there have been some significant elevations.  She stated last week there was even one 200/110.  He denies any chest pain or shortness of breath.  He denies any headache.  There is no bleeding or neurologic symptoms showing.  He had been started on Norvasc 5 mg and has also been increased to 10 mg as of May 11.  He is still continued to have blood pressure elevation.  In reviewing his chart he has had the diagnosis of chronic kidney disease stage II.  On most recent labs his renal functions were normal.  We are going to start lisinopril 10 mg 1 daily and have him come back in 2 weeks for recheck with Michael Mercado and labs at that time.  We will also put a referral in for do Michael Mercado, nephrology.  We have talked to him about quitting smoking.  And he is willing to try patches.   Past Medical History:  Diagnosis Date  . Hypercholesteremia   . Hypertension   . Moderate intellectual disability   . Mood disorder (HCC)   . MR (mental retardation)   . Obesity    Relevant past medical, surgical, family and social history reviewed and updated as indicated. Interim medical history since our last visit reviewed. Allergies and medications reviewed and updated. DATA REVIEWED: CHART IN EPIC  Family History reviewed for pertinent findings.  Review of Systems  Constitutional: Negative.   Respiratory: Negative.   Cardiovascular: Negative.   Gastrointestinal:  Negative.   Neurological: Negative.     Allergies as of 09/07/2018   No Known Allergies     Medication List       Accurate as of Sep 07, 2018  1:22 PM. If you have any questions, ask your nurse or doctor.        amLODipine 10 MG tablet Commonly known as:  NORVASC Take 1 tablet (10 mg total) by mouth daily.   Breo Ellipta 100-25 MCG/INH Aepb Generic drug:  fluticasone furoate-vilanterol INHALE 1 PUFF ONCE DAILY. What changed:  See the new instructions.   feeding supplement Liqd Take 237 mLs by mouth 3 (three) times daily with meals.   levETIRAcetam 500 MG tablet Commonly known as:  KEPPRA TAKE 1 TABLET BY MOUTH TWICE DAILY.   lisinopril 10 MG tablet Commonly known as:  ZESTRIL Take 1 tablet (10 mg total) by mouth daily. Started by:  Remus LofflerAngel S Finlee Concepcion, PA-C   nicotine 14 mg/24hr patch Commonly known as:  NICODERM CQ - dosed in mg/24 hours Place 1 patch (14 mg total) onto the skin daily. Started by:  Remus LofflerAngel S Azrael Maddix, PA-C   pantoprazole 40 MG tablet Commonly known as:  PROTONIX TAKE (1) TABLET BY MOUTH ONCE DAILY. What changed:  See the new instructions.   PARoxetine 20 MG tablet Commonly known as:  PAXIL  Take 1 tablet (20 mg total) by mouth at bedtime.          Objective:    BP (!) 175/82   Pulse 65   Temp 98.7 F (37.1 C) (Oral)   Ht 5\' 6"  (1.676 m)   Wt 152 lb (68.9 kg)   BMI 24.53 kg/m   No Known Allergies  Wt Readings from Last 3 Encounters:  09/07/18 152 lb (68.9 kg)  09/05/18 169 lb 12.1 oz (77 kg)  09/01/18 171 lb (77.6 kg)    Physical Exam Vitals signs and nursing note reviewed.  Constitutional:      General: He is not in acute distress.    Appearance: He is well-developed.  Cardiovascular:     Rate and Rhythm: Normal rate and regular rhythm.     Heart sounds: Normal heart sounds.  Pulmonary:     Effort: Pulmonary effort is normal. No respiratory distress.     Breath sounds: Normal breath sounds.  Skin:    General: Skin is warm and  dry.  Neurological:     Mental Status: He is alert.  Psychiatric:        Behavior: Behavior normal.     Results for orders placed or performed during the hospital encounter of 09/05/18  CBC  Result Value Ref Range   WBC 6.6 4.0 - 10.5 K/uL   RBC 4.55 4.22 - 5.81 MIL/uL   Hemoglobin 12.8 (L) 13.0 - 17.0 g/dL   HCT 60.6 00.4 - 59.9 %   MCV 87.7 80.0 - 100.0 fL   MCH 28.1 26.0 - 34.0 pg   MCHC 32.1 30.0 - 36.0 g/dL   RDW 77.4 14.2 - 39.5 %   Platelets 209 150 - 400 K/uL   nRBC 0.0 0.0 - 0.2 %  Basic metabolic panel  Result Value Ref Range   Sodium 137 135 - 145 mmol/L   Potassium 3.2 (L) 3.5 - 5.1 mmol/L   Chloride 102 98 - 111 mmol/L   CO2 25 22 - 32 mmol/L   Glucose, Bld 150 (H) 70 - 99 mg/dL   BUN 22 8 - 23 mg/dL   Creatinine, Ser 3.20 0.61 - 1.24 mg/dL   Calcium 8.8 (L) 8.9 - 10.3 mg/dL   GFR calc non Af Amer >60 >60 mL/min   GFR calc Af Amer >60 >60 mL/min   Anion gap 10 5 - 15      Assessment & Plan:   1. HTN (hypertension), benign - lisinopril (ZESTRIL) 10 MG tablet; Take 1 tablet (10 mg total) by mouth daily.  Dispense: 90 tablet; Refill: 3 - Ambulatory referral to Nephrology  2. Smoking - nicotine (NICODERM CQ - DOSED IN MG/24 HOURS) 14 mg/24hr patch; Place 1 patch (14 mg total) onto the skin daily.  Dispense: 28 patch; Refill: 1  3. CKD (chronic kidney disease), stage II - Ambulatory referral to Nephrology   Continue all other maintenance medications as listed above.  Follow up plan: Return in about 2 weeks (around 09/21/2018) for recheck [place] and labs.  Educational handout given for DASH siet  Remus Loffler PA-C [facility] [address]  [place], [place] [address] [phone]   09/07/2018, 1:22 PM

## 2018-09-07 NOTE — Patient Instructions (Signed)
DASH Eating Plan  DASH stands for "Dietary Approaches to Stop Hypertension." The DASH eating plan is a healthy eating plan that has been shown to reduce high blood pressure (hypertension). It may also reduce your risk for type 2 diabetes, heart disease, and stroke. The DASH eating plan may also help with weight loss.  What are tips for following this plan?    General guidelines   Avoid eating more than 2,300 mg (milligrams) of salt (sodium) a day. If you have hypertension, you may need to reduce your sodium intake to 1,500 mg a day.   Limit alcohol intake to no more than 1 drink a day for nonpregnant women and 2 drinks a day for men. One drink equals 12 oz of beer, 5 oz of wine, or 1 oz of hard liquor.   Work with your health care provider to maintain a healthy body weight or to lose weight. Ask what an ideal weight is for you.   Get at least 30 minutes of exercise that causes your heart to beat faster (aerobic exercise) most days of the week. Activities may include walking, swimming, or biking.   Work with your health care provider or diet and nutrition specialist (dietitian) to adjust your eating plan to your individual calorie needs.  Reading food labels     Check food labels for the amount of sodium per serving. Choose foods with less than 5 percent of the Daily Value of sodium. Generally, foods with less than 300 mg of sodium per serving fit into this eating plan.   To find whole grains, look for the word "whole" as the first word in the ingredient list.  Shopping   Buy products labeled as "low-sodium" or "no salt added."   Buy fresh foods. Avoid canned foods and premade or frozen meals.  Cooking   Avoid adding salt when cooking. Use salt-free seasonings or herbs instead of table salt or sea salt. Check with your health care provider or pharmacist before using salt substitutes.   Do not fry foods. Cook foods using healthy methods such as baking, boiling, grilling, and broiling instead.   Cook with  heart-healthy oils, such as olive, canola, soybean, or sunflower oil.  Meal planning   Eat a balanced diet that includes:  ? 5 or more servings of fruits and vegetables each day. At each meal, try to fill half of your plate with fruits and vegetables.  ? Up to 6-8 servings of whole grains each day.  ? Less than 6 oz of lean meat, poultry, or fish each day. A 3-oz serving of meat is about the same size as a deck of cards. One egg equals 1 oz.  ? 2 servings of low-fat dairy each day.  ? A serving of nuts, seeds, or beans 5 times each week.  ? Heart-healthy fats. Healthy fats called Omega-3 fatty acids are found in foods such as flaxseeds and coldwater fish, like sardines, salmon, and mackerel.   Limit how much you eat of the following:  ? Canned or prepackaged foods.  ? Food that is high in trans fat, such as fried foods.  ? Food that is high in saturated fat, such as fatty meat.  ? Sweets, desserts, sugary drinks, and other foods with added sugar.  ? Full-fat dairy products.   Do not salt foods before eating.   Try to eat at least 2 vegetarian meals each week.   Eat more home-cooked food and less restaurant, buffet, and fast food.     When eating at a restaurant, ask that your food be prepared with less salt or no salt, if possible.  What foods are recommended?  The items listed may not be a complete list. Talk with your dietitian about what dietary choices are best for you.  Grains  Whole-grain or whole-wheat bread. Whole-grain or whole-wheat pasta. Brown rice. Oatmeal. Quinoa. Bulgur. Whole-grain and low-sodium cereals. Pita bread. Low-fat, low-sodium crackers. Whole-wheat flour tortillas.  Vegetables  Fresh or frozen vegetables (raw, steamed, roasted, or grilled). Low-sodium or reduced-sodium tomato and vegetable juice. Low-sodium or reduced-sodium tomato sauce and tomato paste. Low-sodium or reduced-sodium canned vegetables.  Fruits  All fresh, dried, or frozen fruit. Canned fruit in natural juice (without  added sugar).  Meat and other protein foods  Skinless chicken or turkey. Ground chicken or turkey. Pork with fat trimmed off. Fish and seafood. Egg whites. Dried beans, peas, or lentils. Unsalted nuts, nut butters, and seeds. Unsalted canned beans. Lean cuts of beef with fat trimmed off. Low-sodium, lean deli meat.  Dairy  Low-fat (1%) or fat-free (skim) milk. Fat-free, low-fat, or reduced-fat cheeses. Nonfat, low-sodium ricotta or cottage cheese. Low-fat or nonfat yogurt. Low-fat, low-sodium cheese.  Fats and oils  Soft margarine without trans fats. Vegetable oil. Low-fat, reduced-fat, or light mayonnaise and salad dressings (reduced-sodium). Canola, safflower, olive, soybean, and sunflower oils. Avocado.  Seasoning and other foods  Herbs. Spices. Seasoning mixes without salt. Unsalted popcorn and pretzels. Fat-free sweets.  What foods are not recommended?  The items listed may not be a complete list. Talk with your dietitian about what dietary choices are best for you.  Grains  Baked goods made with fat, such as croissants, muffins, or some breads. Dry pasta or rice meal packs.  Vegetables  Creamed or fried vegetables. Vegetables in a cheese sauce. Regular canned vegetables (not low-sodium or reduced-sodium). Regular canned tomato sauce and paste (not low-sodium or reduced-sodium). Regular tomato and vegetable juice (not low-sodium or reduced-sodium). Pickles. Olives.  Fruits  Canned fruit in a light or heavy syrup. Fried fruit. Fruit in cream or butter sauce.  Meat and other protein foods  Fatty cuts of meat. Ribs. Fried meat. Bacon. Sausage. Bologna and other processed lunch meats. Salami. Fatback. Hotdogs. Bratwurst. Salted nuts and seeds. Canned beans with added salt. Canned or smoked fish. Whole eggs or egg yolks. Chicken or turkey with skin.  Dairy  Whole or 2% milk, cream, and half-and-half. Whole or full-fat cream cheese. Whole-fat or sweetened yogurt. Full-fat cheese. Nondairy creamers. Whipped toppings.  Processed cheese and cheese spreads.  Fats and oils  Butter. Stick margarine. Lard. Shortening. Ghee. Bacon fat. Tropical oils, such as coconut, palm kernel, or palm oil.  Seasoning and other foods  Salted popcorn and pretzels. Onion salt, garlic salt, seasoned salt, table salt, and sea salt. Worcestershire sauce. Tartar sauce. Barbecue sauce. Teriyaki sauce. Soy sauce, including reduced-sodium. Steak sauce. Canned and packaged gravies. Fish sauce. Oyster sauce. Cocktail sauce. Horseradish that you find on the shelf. Ketchup. Mustard. Meat flavorings and tenderizers. Bouillon cubes. Hot sauce and Tabasco sauce. Premade or packaged marinades. Premade or packaged taco seasonings. Relishes. Regular salad dressings.  Where to find more information:   National Heart, Lung, and Blood Institute: www.nhlbi.nih.gov   American Heart Association: www.heart.org  Summary   The DASH eating plan is a healthy eating plan that has been shown to reduce high blood pressure (hypertension). It may also reduce your risk for type 2 diabetes, heart disease, and stroke.   With the   DASH eating plan, you should limit salt (sodium) intake to 2,300 mg a day. If you have hypertension, you may need to reduce your sodium intake to 1,500 mg a day.   When on the DASH eating plan, aim to eat more fresh fruits and vegetables, whole grains, lean proteins, low-fat dairy, and heart-healthy fats.   Work with your health care provider or diet and nutrition specialist (dietitian) to adjust your eating plan to your individual calorie needs.  This information is not intended to replace advice given to you by your health care provider. Make sure you discuss any questions you have with your health care provider.  Document Released: 03/26/2011 Document Revised: 03/30/2016 Document Reviewed: 03/30/2016  Elsevier Interactive Patient Education  2019 Elsevier Inc.

## 2018-09-08 DIAGNOSIS — S065X9D Traumatic subdural hemorrhage with loss of consciousness of unspecified duration, subsequent encounter: Secondary | ICD-10-CM | POA: Diagnosis not present

## 2018-09-08 DIAGNOSIS — G8191 Hemiplegia, unspecified affecting right dominant side: Secondary | ICD-10-CM | POA: Diagnosis not present

## 2018-09-08 DIAGNOSIS — R1310 Dysphagia, unspecified: Secondary | ICD-10-CM | POA: Diagnosis not present

## 2018-09-08 DIAGNOSIS — R471 Dysarthria and anarthria: Secondary | ICD-10-CM | POA: Diagnosis not present

## 2018-09-08 DIAGNOSIS — F209 Schizophrenia, unspecified: Secondary | ICD-10-CM | POA: Diagnosis not present

## 2018-09-08 DIAGNOSIS — F70 Mild intellectual disabilities: Secondary | ICD-10-CM | POA: Diagnosis not present

## 2018-09-13 ENCOUNTER — Encounter: Payer: Self-pay | Admitting: Family Medicine

## 2018-09-13 DIAGNOSIS — G8191 Hemiplegia, unspecified affecting right dominant side: Secondary | ICD-10-CM | POA: Diagnosis not present

## 2018-09-13 DIAGNOSIS — R471 Dysarthria and anarthria: Secondary | ICD-10-CM | POA: Diagnosis not present

## 2018-09-13 DIAGNOSIS — R1310 Dysphagia, unspecified: Secondary | ICD-10-CM | POA: Diagnosis not present

## 2018-09-13 DIAGNOSIS — F209 Schizophrenia, unspecified: Secondary | ICD-10-CM | POA: Diagnosis not present

## 2018-09-13 DIAGNOSIS — S065X9D Traumatic subdural hemorrhage with loss of consciousness of unspecified duration, subsequent encounter: Secondary | ICD-10-CM | POA: Diagnosis not present

## 2018-09-13 DIAGNOSIS — F70 Mild intellectual disabilities: Secondary | ICD-10-CM | POA: Diagnosis not present

## 2018-09-14 ENCOUNTER — Other Ambulatory Visit: Payer: Self-pay | Admitting: Family Medicine

## 2018-09-14 ENCOUNTER — Other Ambulatory Visit: Payer: Self-pay | Admitting: Physical Medicine & Rehabilitation

## 2018-09-14 DIAGNOSIS — G8191 Hemiplegia, unspecified affecting right dominant side: Secondary | ICD-10-CM | POA: Diagnosis not present

## 2018-09-14 DIAGNOSIS — S065X9D Traumatic subdural hemorrhage with loss of consciousness of unspecified duration, subsequent encounter: Secondary | ICD-10-CM | POA: Diagnosis not present

## 2018-09-14 DIAGNOSIS — R1310 Dysphagia, unspecified: Secondary | ICD-10-CM | POA: Diagnosis not present

## 2018-09-14 DIAGNOSIS — F70 Mild intellectual disabilities: Secondary | ICD-10-CM | POA: Diagnosis not present

## 2018-09-14 DIAGNOSIS — R471 Dysarthria and anarthria: Secondary | ICD-10-CM | POA: Diagnosis not present

## 2018-09-14 DIAGNOSIS — I10 Essential (primary) hypertension: Secondary | ICD-10-CM

## 2018-09-14 DIAGNOSIS — F209 Schizophrenia, unspecified: Secondary | ICD-10-CM | POA: Diagnosis not present

## 2018-09-15 DIAGNOSIS — G8191 Hemiplegia, unspecified affecting right dominant side: Secondary | ICD-10-CM | POA: Diagnosis not present

## 2018-09-15 DIAGNOSIS — F209 Schizophrenia, unspecified: Secondary | ICD-10-CM | POA: Diagnosis not present

## 2018-09-15 DIAGNOSIS — F70 Mild intellectual disabilities: Secondary | ICD-10-CM | POA: Diagnosis not present

## 2018-09-15 DIAGNOSIS — R471 Dysarthria and anarthria: Secondary | ICD-10-CM | POA: Diagnosis not present

## 2018-09-15 DIAGNOSIS — R1310 Dysphagia, unspecified: Secondary | ICD-10-CM | POA: Diagnosis not present

## 2018-09-15 DIAGNOSIS — S065X9D Traumatic subdural hemorrhage with loss of consciousness of unspecified duration, subsequent encounter: Secondary | ICD-10-CM | POA: Diagnosis not present

## 2018-09-20 DIAGNOSIS — F319 Bipolar disorder, unspecified: Secondary | ICD-10-CM | POA: Diagnosis not present

## 2018-09-21 ENCOUNTER — Ambulatory Visit (INDEPENDENT_AMBULATORY_CARE_PROVIDER_SITE_OTHER): Payer: Medicare Other | Admitting: Family

## 2018-09-21 ENCOUNTER — Encounter: Payer: Self-pay | Admitting: Family

## 2018-09-21 ENCOUNTER — Encounter: Payer: Self-pay | Admitting: Family Medicine

## 2018-09-21 ENCOUNTER — Other Ambulatory Visit: Payer: Self-pay

## 2018-09-21 VITALS — BP 189/114 | HR 75 | Temp 96.0°F | Ht 66.0 in | Wt 151.0 lb

## 2018-09-21 DIAGNOSIS — F70 Mild intellectual disabilities: Secondary | ICD-10-CM | POA: Diagnosis not present

## 2018-09-21 DIAGNOSIS — B029 Zoster without complications: Secondary | ICD-10-CM | POA: Diagnosis not present

## 2018-09-21 DIAGNOSIS — R471 Dysarthria and anarthria: Secondary | ICD-10-CM | POA: Diagnosis not present

## 2018-09-21 DIAGNOSIS — F209 Schizophrenia, unspecified: Secondary | ICD-10-CM | POA: Diagnosis not present

## 2018-09-21 DIAGNOSIS — G8191 Hemiplegia, unspecified affecting right dominant side: Secondary | ICD-10-CM | POA: Diagnosis not present

## 2018-09-21 DIAGNOSIS — R1310 Dysphagia, unspecified: Secondary | ICD-10-CM | POA: Diagnosis not present

## 2018-09-21 DIAGNOSIS — S065X9D Traumatic subdural hemorrhage with loss of consciousness of unspecified duration, subsequent encounter: Secondary | ICD-10-CM | POA: Diagnosis not present

## 2018-09-21 MED ORDER — VALACYCLOVIR HCL 1 G PO TABS: 1000.0000 mg | ORAL_TABLET | Freq: Three times a day (TID) | ORAL | 0 refills | Status: DC

## 2018-09-21 NOTE — Progress Notes (Signed)
   Subjective:    Patient ID: Michael Mercado, male    DOB: 1941-03-13, 79 y.o.   MRN: [id number]  Chief Complaint  Patient presents with  . rash with itching   PT presents to the office today with rash. He is a resident at [facility].  Rash  This is a new problem. The current episode started yesterday. The problem is unchanged. The affected locations include the chest. The rash is characterized by itchiness and redness. He was exposed to nothing. Pertinent negatives include no congestion, diarrhea, fatigue, fever, shortness of breath or sore throat. Past treatments include nothing. The treatment provided no relief.      Review of Systems  Constitutional: Negative for fatigue and fever.  HENT: Negative for congestion and sore throat.   Respiratory: Negative for shortness of breath.   Gastrointestinal: Negative for diarrhea.  Skin: Positive for rash.  All other systems reviewed and are negative.      Objective:   Physical Exam Vitals signs reviewed.  Constitutional:      General: He is not in acute distress.    Appearance: He is well-developed.  Neck:     Musculoskeletal: Normal range of motion and neck supple.     Thyroid: No thyromegaly.  Cardiovascular:     Rate and Rhythm: Normal rate and regular rhythm.     Heart sounds: Normal heart sounds. No murmur.  Pulmonary:     Effort: Pulmonary effort is normal. No respiratory distress.     Breath sounds: Normal breath sounds. No wheezing.  Abdominal:     General: Bowel sounds are normal. There is no distension.     Palpations: Abdomen is soft.     Tenderness: There is no abdominal tenderness.  Skin:    General: Skin is warm and dry.     Findings: Rash present. No erythema.          Comments: Erythemas scabbed rash on left chest  Neurological:     Mental Status: He is alert and oriented to person, place, and time.     Deep Tendon Reflexes: Reflexes are normal and symmetric.      BP (!) 189/114   Pulse 75    Temp (!) 96 F (35.6 C) (Oral)   Ht 5\' 6"  (1.676 m)   Wt 151 lb (68.5 kg)   BMI 24.37 kg/m      Assessment & Plan:  JESSI VERGE comes in today with chief complaint of rash with itching   Diagnosis and orders addressed:  1. Herpes zoster without complication Do not scratch Avoid immune compromised RTO if symptoms worsen or do not improve  - valACYclovir (VALTREX) 1000 MG tablet; Take 1 tablet (1,000 mg total) by mouth 3 (three) times daily.  Dispense: 21 tablet; Refill: 0  Jannifer Rodney, FNP

## 2018-09-21 NOTE — Patient Instructions (Signed)
Shingles    Shingles, which is also known as herpes zoster, is an infection that causes a painful skin rash and fluid-filled blisters. It is caused by a virus.  Shingles only develops in people who:   Have had chickenpox.   Have been given a medicine to protect against chickenpox (have been vaccinated). Shingles is rare in this group.  What are the causes?  Shingles is caused by varicella-zoster virus (VZV). This is the same virus that causes chickenpox. After a person is exposed to VZV, the virus stays in the body in an inactive (dormant) state. Shingles develops if the virus is reactivated. This can happen many years after the first (initial) exposure to VZV. It is not known what causes this virus to be reactivated.  What increases the risk?  People who have had chickenpox or received the chickenpox vaccine are at risk for shingles. Shingles infection is more common in people who:   Are older than age 60.   Have a weakened disease-fighting system (immune system), such as people with:  ? HIV.  ? AIDS.  ? Cancer.   Are taking medicines that weaken the immune system, such as transplant medicines.   Are experiencing a lot of stress.  What are the signs or symptoms?  Early symptoms of this condition include itching, tingling, and pain in an area on your skin. Pain may be described as burning, stabbing, or throbbing.  A few days or weeks after early symptoms start, a painful red rash appears. The rash is usually on one side of the body and has a band-like or belt-like pattern. The rash eventually turns into fluid-filled blisters that break open, change into scabs, and dry up in about 2-3 weeks.  At any time during the infection, you may also develop:   A fever.   Chills.   A headache.   An upset stomach.  How is this diagnosed?  This condition is diagnosed with a skin exam. Skin or fluid samples may be taken from the blisters before a diagnosis is made. These samples are examined under a microscope or sent to  a lab for testing.  How is this treated?  The rash may last for several weeks. There is not a specific cure for this condition. Your health care provider will probably prescribe medicines to help you manage pain, recover more quickly, and avoid long-term problems. Medicines may include:   Antiviral drugs.   Anti-inflammatory drugs.   Pain medicines.   Anti-itching medicines (antihistamines).  If the area involved is on your face, you may be referred to a specialist, such as an eye doctor (ophthalmologist) or an ear, nose, and throat (ENT) doctor (otolaryngologist) to help you avoid eye problems, chronic pain, or disability.  Follow these instructions at home:  Medicines   Take over-the-counter and prescription medicines only as told by your health care provider.   Apply an anti-itch cream or numbing cream to the affected area as told by your health care provider.  Relieving itching and discomfort     Apply cold, wet cloths (cold compresses) to the area of the rash or blisters as told by your health care provider.   Cool baths can be soothing. Try adding baking soda or dry oatmeal to the water to reduce itching. Do not bathe in hot water.  Blister and rash care   Keep your rash covered with a loose bandage (dressing). Wear loose-fitting clothing to help ease the pain of material rubbing against the rash.     Keep your rash and blisters clean by washing the area with mild soap and cool water as told by your health care provider.   Check your rash every day for signs of infection. Check for:  ? More redness, swelling, or pain.  ? Fluid or blood.  ? Warmth.  ? Pus or a bad smell.   Do not scratch your rash or pick at your blisters. To help avoid scratching:  ? Keep your fingernails clean and cut short.  ? Wear gloves or mittens while you sleep, if scratching is a problem.  General instructions   Rest as told by your health care provider.   Keep all follow-up visits as told by your health care provider. This  is important.   Wash your hands often with soap and water. If soap and water are not available, use hand sanitizer. Doing this lowers your chance of getting a bacterial skin infection.   Before your blisters change into scabs, your shingles infection can cause chickenpox in people who have never had it or have never been vaccinated against it. To prevent this from happening, avoid contact with other people, especially:  ? Babies.  ? Pregnant women.  ? Children who have eczema.  ? Elderly people who have transplants.  ? People who have chronic illnesses, such as cancer or AIDS.  Contact a health care provider if:   Your pain is not relieved with prescribed medicines.   Your pain does not get better after the rash heals.   You have signs of infection in the rash area, such as:  ? More redness, swelling, or pain around the rash.  ? Fluid or blood coming from the rash.  ? The rash area feeling warm to the touch.  ? Pus or a bad smell coming from the rash.  Get help right away if:   The rash is on your face or nose.   You have facial pain, pain around your eye area, or loss of feeling on one side of your face.   You have difficulty seeing.   You have ear pain or have ringing in your ear.   You have a loss of taste.   Your condition gets worse.  Summary   Shingles, which is also known as herpes zoster, is an infection that causes a painful skin rash and fluid-filled blisters.   This condition is diagnosed with a skin exam. Skin or fluid samples may be taken from the blisters and examined before the diagnosis is made.   Keep your rash covered with a loose bandage (dressing). Wear loose-fitting clothing to help ease the pain of material rubbing against the rash.   Before your blisters change into scabs, your shingles infection can cause chickenpox in people who have never had it or have never been vaccinated against it.  This information is not intended to replace advice given to you by your health care  provider. Make sure you discuss any questions you have with your health care provider.  Document Released: 04/06/2005 Document Revised: 12/09/2016 Document Reviewed: 12/09/2016  Elsevier Interactive Patient Education  2019 Elsevier Inc.

## 2018-09-22 DIAGNOSIS — R1310 Dysphagia, unspecified: Secondary | ICD-10-CM | POA: Diagnosis not present

## 2018-09-22 DIAGNOSIS — F209 Schizophrenia, unspecified: Secondary | ICD-10-CM | POA: Diagnosis not present

## 2018-09-22 DIAGNOSIS — G8191 Hemiplegia, unspecified affecting right dominant side: Secondary | ICD-10-CM | POA: Diagnosis not present

## 2018-09-22 DIAGNOSIS — S065X9D Traumatic subdural hemorrhage with loss of consciousness of unspecified duration, subsequent encounter: Secondary | ICD-10-CM | POA: Diagnosis not present

## 2018-09-22 DIAGNOSIS — F70 Mild intellectual disabilities: Secondary | ICD-10-CM | POA: Diagnosis not present

## 2018-09-22 DIAGNOSIS — R471 Dysarthria and anarthria: Secondary | ICD-10-CM | POA: Diagnosis not present

## 2018-09-27 ENCOUNTER — Other Ambulatory Visit: Payer: Self-pay

## 2018-09-28 ENCOUNTER — Encounter: Payer: Self-pay | Admitting: Family Medicine

## 2018-09-28 ENCOUNTER — Ambulatory Visit (INDEPENDENT_AMBULATORY_CARE_PROVIDER_SITE_OTHER): Payer: Medicare Other | Admitting: Family Medicine

## 2018-09-28 VITALS — BP 138/82 | HR 81 | Temp 96.8°F | Ht 66.0 in | Wt 148.8 lb

## 2018-09-28 DIAGNOSIS — B029 Zoster without complications: Secondary | ICD-10-CM

## 2018-09-28 NOTE — Progress Notes (Signed)
BP 138/82   Pulse 81   Temp (!) 96.8 F (36 C) (Oral)   Ht 5\' 6"  (1.676 m)   Wt 148 lb 12.8 oz (67.5 kg)   BMI 24.02 kg/m    Subjective:   Patient ID: Michael Mercado, male    DOB: 11/15/1940, 78 y.o.   MRN: [id number]  HPI: Michael CunasCharles E Elting is a 78 y.o. male presenting on 09/28/2018 for Herpes Zoster (2 week re check)   HPI Patient is coming in for recheck of acne zoster.  He is on the last day of Valtrex today and is looking much improved and the only has a couple of small spots left and denies any pain today.  He is [place] caregiver [place] says she feels like it is much improved and almost gone and he has not complained about it any further.  Patient is gaining weight and eating fine and she feels like he does not need the Carnation or the boost or the spoon feeds and wants him order to discontinue those.  Relevant past medical, surgical, family and social history reviewed and updated as indicated. Interim medical history since our last visit reviewed. Allergies and medications reviewed and updated.  Review of Systems  Constitutional: Negative for chills and fever.  Respiratory: Negative for wheezing.   Cardiovascular: Negative for chest pain and leg swelling.  Musculoskeletal: Negative for back pain and gait problem.  Skin: Positive for rash.  All other systems reviewed and are negative.   Per HPI unless specifically indicated above   Allergies as of 09/28/2018   No Known Allergies     Medication List       Accurate as of September 28, 2018 10:09 AM. If you have any questions, ask your nurse or doctor.        amLODipine 10 MG tablet Commonly known as:  NORVASC TAKE (1) TABLET BY MOUTH ONCE DAILY.   Breo Ellipta 100-25 MCG/INH Aepb Generic drug:  fluticasone furoate-vilanterol INHALE 1 PUFF ONCE DAILY. What changed:  See the new instructions.   feeding supplement Liqd Take 237 mLs by mouth 3 (three) times daily with meals.   levETIRAcetam 500  MG tablet Commonly known as:  KEPPRA TAKE 1 TABLET BY MOUTH TWICE DAILY.   lisinopril 10 MG tablet Commonly known as:  ZESTRIL Take 1 tablet (10 mg total) by mouth daily.   nicotine 14 mg/24hr patch Commonly known as:  NICODERM CQ - dosed in mg/24 hours Place 1 patch (14 mg total) onto the skin daily.   pantoprazole 40 MG tablet Commonly known as:  PROTONIX TAKE (1) TABLET BY MOUTH ONCE DAILY. What changed:  See the new instructions.   PARoxetine 20 MG tablet Commonly known as:  PAXIL TAKE 1 TABLET BY MOUTH AT BEDTIME.   valACYclovir 1000 MG tablet Commonly known as:  Valtrex Take 1 tablet (1,000 mg total) by mouth 3 (three) times daily.        Objective:   BP 138/82   Pulse 81   Temp (!) 96.8 F (36 C) (Oral)   Ht 5\' 6"  (1.676 m)   Wt 148 lb 12.8 oz (67.5 kg)   BMI 24.02 kg/m   Wt Readings from Last 3 Encounters:  09/28/18 148 lb 12.8 oz (67.5 kg)  09/21/18 151 lb (68.5 kg)  09/07/18 152 lb (68.9 kg)    Physical Exam Vitals signs and nursing note reviewed.  Constitutional:      General: He is not in acute distress.  Appearance: He is well-developed. He is not diaphoretic.  Eyes:     General: No scleral icterus.    Conjunctiva/sclera: Conjunctivae normal.  Neck:     Musculoskeletal: Neck supple.     Thyroid: No thyromegaly.  Cardiovascular:     Rate and Rhythm: Normal rate and regular rhythm.     Heart sounds: Normal heart sounds. No murmur.  Pulmonary:     Effort: Pulmonary effort is normal. No respiratory distress.     Breath sounds: Normal breath sounds. No wheezing.  Musculoskeletal: Normal range of motion.  Lymphadenopathy:     Cervical: No cervical adenopathy.  Skin:    General: Skin is warm and dry.     Findings: Rash (Few small open papules on right chest but otherwise clear skin and none on back.) present.  Neurological:     Mental Status: He is alert and oriented to person, place, and time.     Coordination: Coordination normal.   Psychiatric:        Behavior: Behavior normal.     Results for orders placed or performed during the hospital encounter of 09/05/18  CBC  Result Value Ref Range   WBC 6.6 4.0 - 10.5 K/uL   RBC 4.55 4.22 - 5.81 MIL/uL   Hemoglobin 12.8 (L) 13.0 - 17.0 g/dL   HCT 39.9 39.0 - 52.0 %   MCV 87.7 80.0 - 100.0 fL   MCH 28.1 26.0 - 34.0 pg   MCHC 32.1 30.0 - 36.0 g/dL   RDW 14.6 11.5 - 15.5 %   Platelets 209 150 - 400 K/uL   nRBC 0.0 0.0 - 0.2 %  Basic metabolic panel  Result Value Ref Range   Sodium 137 135 - 145 mmol/L   Potassium 3.2 (L) 3.5 - 5.1 mmol/L   Chloride 102 98 - 111 mmol/L   CO2 25 22 - 32 mmol/L   Glucose, Bld 150 (H) 70 - 99 mg/dL   BUN 22 8 - 23 mg/dL   Creatinine, Ser 1.06 0.61 - 1.24 mg/dL   Calcium 8.8 (L) 8.9 - 10.3 mg/dL   GFR calc non Af Amer >60 >60 mL/min   GFR calc Af Amer >60 >60 mL/min   Anion gap 10 5 - 15    Assessment & Plan:   Problem List Items Addressed This Visit    None    Visit Diagnoses    Herpes zoster without complication    -  Primary       Follow up plan: Return if symptoms worsen or fail to improve.  Counseling provided for all of the vaccine components No orders of the defined types were placed in this encounter.   Caryl Pina, MD [facility] 09/28/2018, 10:09 AM

## 2018-10-07 ENCOUNTER — Other Ambulatory Visit: Payer: Self-pay | Admitting: Physician Assistant

## 2018-10-07 DIAGNOSIS — F17200 Nicotine dependence, unspecified, uncomplicated: Secondary | ICD-10-CM

## 2018-10-11 ENCOUNTER — Encounter: Payer: Self-pay | Admitting: Nurse Practitioner

## 2018-10-11 ENCOUNTER — Ambulatory Visit (INDEPENDENT_AMBULATORY_CARE_PROVIDER_SITE_OTHER): Payer: Medicare Other | Admitting: Nurse Practitioner

## 2018-10-11 ENCOUNTER — Other Ambulatory Visit: Payer: Self-pay

## 2018-10-11 ENCOUNTER — Telehealth: Payer: Self-pay | Admitting: Family Medicine

## 2018-10-11 DIAGNOSIS — B86 Scabies: Secondary | ICD-10-CM | POA: Diagnosis not present

## 2018-10-11 MED ORDER — PERMETHRIN 5 % EX CREA: TOPICAL_CREAM | CUTANEOUS | 0 refills | Status: DC

## 2018-10-11 MED ORDER — TRIAMCINOLONE ACETONIDE 0.1 % EX CREA: 1.0000 "application " | TOPICAL_CREAM | Freq: Two times a day (BID) | CUTANEOUS | 0 refills | Status: DC

## 2018-10-11 NOTE — Progress Notes (Signed)
   Virtual Visit via telephone Note  I connected with Michael Mercado on 10/11/18 at 2:25 PM by video and verified that I am speaking with the correct person using two identifiers. Michael Mercado is currently located at home and his wife is currently with her during visit. The provider, Mary-Margaret Hassell Done, FNP is located in their office at time of visit.  I discussed the limitations, risks, security and privacy concerns of performing an evaluation and management service by telephone and the availability of in person appointments. I also discussed with the patient that there may be a patient responsible charge related to this service. The patient expressed understanding and agreed to proceed.   History and Present Illness:   Chief Complaint: Herpes Zoster   HPI Patient calls in today c/o shingles. He was seen 09/28/18 and was given valtrex . The rash is spreading and is very itchy. It is in varying spots on anterior trunk and on midline of lower spine   ROS   Observations/Objective: Alert and oriented excoriated rash all over ant truck in linear patterns, on riht hand and on lower midline of back.  Assessment and Plan: Michael Mercado in today with chief complaint of Herpes Zoster   1. Scabies Avoid scratching - triamcinolone cream (KENALOG) 0.1 %; Apply 1 application topically 2 (two) times daily.  Dispense: 30 g; Refill: 0 - permethrin (ELIMITE) 5 % cream; Apply to entire body form neck down. Leave on 8 hours then rinse  Dispense: 60 g; Refill: 0   Follow Up Instructions: Care giver to call Thursday if no better    I discussed the assessment and treatment plan with the patient. The patient was provided an opportunity to ask questions and all were answered. The patient agreed with the plan and demonstrated an understanding of the instructions.   The patient was advised to call back or seek an in-person evaluation if the symptoms worsen or if the condition fails  to improve as anticipated.  The above assessment and management plan was discussed with the patient. The patient verbalized understanding of and has agreed to the management plan. Patient is aware to call the clinic if symptoms persist or worsen. Patient is aware when to return to the clinic for a follow-up visit. Patient educated on when it is appropriate to go to the emergency department.   Time video ended:  2:40  I provided 15 minutes of non-face-to-face time during this encounter.    Mary-Margaret Hassell Done, FNP

## 2018-10-13 ENCOUNTER — Other Ambulatory Visit: Payer: Self-pay | Admitting: Family Medicine

## 2018-10-13 DIAGNOSIS — I10 Essential (primary) hypertension: Secondary | ICD-10-CM

## 2018-10-18 DIAGNOSIS — F319 Bipolar disorder, unspecified: Secondary | ICD-10-CM | POA: Diagnosis not present

## 2018-10-26 ENCOUNTER — Other Ambulatory Visit: Payer: Self-pay | Admitting: Family Medicine

## 2018-10-28 ENCOUNTER — Other Ambulatory Visit: Payer: Self-pay | Admitting: Nephrology

## 2018-10-28 DIAGNOSIS — N183 Chronic kidney disease, stage 3 unspecified: Secondary | ICD-10-CM

## 2018-11-11 ENCOUNTER — Other Ambulatory Visit: Payer: Self-pay | Admitting: Physician Assistant

## 2018-11-16 ENCOUNTER — Ambulatory Visit (HOSPITAL_COMMUNITY)
Admission: RE | Admit: 2018-11-16 | Discharge: 2018-11-16 | Disposition: A | Discharge status: Home / Self Care | Payer: Medicare Other | Source: Ambulatory Visit | Attending: Nephrology | Admitting: Nephrology

## 2018-11-16 ENCOUNTER — Other Ambulatory Visit: Payer: Self-pay

## 2018-11-16 DIAGNOSIS — N183 Chronic kidney disease, stage 3 unspecified: Secondary | ICD-10-CM

## 2018-11-16 DIAGNOSIS — N281 Cyst of kidney, acquired: Secondary | ICD-10-CM | POA: Diagnosis not present

## 2018-11-22 ENCOUNTER — Other Ambulatory Visit: Payer: Medicare Other

## 2018-11-22 DIAGNOSIS — Z1159 Encounter for screening for other viral diseases: Secondary | ICD-10-CM | POA: Diagnosis not present

## 2018-11-22 DIAGNOSIS — I10 Essential (primary) hypertension: Secondary | ICD-10-CM | POA: Diagnosis not present

## 2018-11-22 DIAGNOSIS — Z79899 Other long term (current) drug therapy: Secondary | ICD-10-CM | POA: Diagnosis not present

## 2018-11-22 DIAGNOSIS — N183 Chronic kidney disease, stage 3 (moderate): Secondary | ICD-10-CM | POA: Diagnosis not present

## 2018-11-22 DIAGNOSIS — E876 Hypokalemia: Secondary | ICD-10-CM | POA: Diagnosis not present

## 2018-11-23 ENCOUNTER — Encounter: Payer: Self-pay | Admitting: Family Medicine

## 2018-12-15 ENCOUNTER — Other Ambulatory Visit: Payer: Self-pay | Admitting: Family Medicine

## 2018-12-15 ENCOUNTER — Other Ambulatory Visit: Payer: Self-pay | Admitting: Physician Assistant

## 2018-12-15 DIAGNOSIS — J439 Emphysema, unspecified: Secondary | ICD-10-CM

## 2018-12-15 DIAGNOSIS — I10 Essential (primary) hypertension: Secondary | ICD-10-CM

## 2018-12-19 DIAGNOSIS — F319 Bipolar disorder, unspecified: Secondary | ICD-10-CM | POA: Diagnosis not present

## 2018-12-21 ENCOUNTER — Other Ambulatory Visit: Payer: Self-pay

## 2018-12-22 ENCOUNTER — Ambulatory Visit (INDEPENDENT_AMBULATORY_CARE_PROVIDER_SITE_OTHER): Payer: Medicare Other | Admitting: Family Medicine

## 2018-12-22 ENCOUNTER — Encounter: Payer: Self-pay | Admitting: Family Medicine

## 2018-12-22 VITALS — BP 136/78 | HR 62 | Temp 98.9°F | Ht 66.0 in | Wt 147.2 lb

## 2018-12-22 DIAGNOSIS — L209 Atopic dermatitis, unspecified: Secondary | ICD-10-CM | POA: Diagnosis not present

## 2018-12-22 DIAGNOSIS — I10 Essential (primary) hypertension: Secondary | ICD-10-CM

## 2018-12-22 DIAGNOSIS — Z9181 History of falling: Secondary | ICD-10-CM

## 2018-12-22 DIAGNOSIS — E785 Hyperlipidemia, unspecified: Secondary | ICD-10-CM

## 2018-12-22 DIAGNOSIS — B86 Scabies: Secondary | ICD-10-CM | POA: Diagnosis not present

## 2018-12-22 DIAGNOSIS — J449 Chronic obstructive pulmonary disease, unspecified: Secondary | ICD-10-CM

## 2018-12-22 DIAGNOSIS — N182 Chronic kidney disease, stage 2 (mild): Secondary | ICD-10-CM | POA: Diagnosis not present

## 2018-12-22 DIAGNOSIS — R2689 Other abnormalities of gait and mobility: Secondary | ICD-10-CM

## 2018-12-22 LAB — CMP14+EGFR
ALT: 12 IU/L (ref 0–44)
AST: 21 IU/L (ref 0–40)
Albumin/Globulin Ratio: 2.2 (ref 1.2–2.2)
Albumin: 4.6 g/dL (ref 3.7–4.7)
Alkaline Phosphatase: 115 IU/L (ref 39–117)
BUN/Creatinine Ratio: 14 (ref 10–24)
BUN: 17 mg/dL (ref 8–27)
Bilirubin Total: 0.5 mg/dL (ref 0.0–1.2)
CO2: 23 mmol/L (ref 20–29)
Calcium: 9.5 mg/dL (ref 8.6–10.2)
Chloride: 104 mmol/L (ref 96–106)
Creatinine, Ser: 1.23 mg/dL (ref 0.76–1.27)
GFR calc Af Amer: 66 mL/min/{1.73_m2} (ref >59)
GFR calc non Af Amer: 57 mL/min/{1.73_m2} — ABNORMAL LOW (ref >59)
Globulin, Total: 2.1 g/dL (ref 1.5–4.5)
Glucose: 85 mg/dL (ref 65–99)
Potassium: 4.4 mmol/L (ref 3.5–5.2)
Sodium: 143 mmol/L (ref 134–144)
Total Protein: 6.7 g/dL (ref 6.0–8.5)

## 2018-12-22 LAB — LIPID PANEL
Chol/HDL Ratio: 3.7 ratio (ref 0.0–5.0)
Cholesterol, Total: 146 mg/dL (ref 100–199)
HDL: 40 mg/dL (ref >39)
LDL Chol Calc (NIH): 90 mg/dL (ref 0–99)
Triglycerides: 80 mg/dL (ref 0–149)
VLDL Cholesterol Cal: 16 mg/dL (ref 5–40)

## 2018-12-22 MED ORDER — PERMETHRIN 5 % EX CREA: TOPICAL_CREAM | CUTANEOUS | 0 refills | Status: DC

## 2018-12-22 MED ORDER — DESITIN EX OINT: 1.0000 "application " | TOPICAL_OINTMENT | Freq: Four times a day (QID) | CUTANEOUS | 3 refills | Status: DC | PRN

## 2018-12-22 MED ORDER — TRIAMCINOLONE ACETONIDE 0.1 % EX CREA: 1.0000 "application " | TOPICAL_CREAM | Freq: Two times a day (BID) | CUTANEOUS | 0 refills | Status: DC

## 2018-12-22 NOTE — Progress Notes (Signed)
BP 136/78   Pulse 62   Temp 98.9 F (37.2 C) (Temporal)   Ht _0  (1.676 m)   Wt 147 lb 3.2 oz (66.8 kg)   BMI 23.76 kg/m    Subjective:   Patient ID: Michael Mercado, male    DOB: Jun 07, 1940, 78 y.o.   MRN: [id number]  HPI: Michael Mercado is a 78 y.o. male presenting on 12/22/2018 for Hypertension (6 month follow up. Patient has been drooling x 1 month ), Hyperlipidemia, and Rash (back and chest -ongoing )   HPI Hypertension Patient is currently on lisinopril and amlodipine, and their blood pressure today is 136/78. Patient denies any lightheadedness or dizziness. Patient denies headaches, blurred vision, chest pains, shortness of breath, or weakness. Denies any side effects from medication and is content with current medication.   Hyperlipidemia Patient is coming in for recheck of his hyperlipidemia. The patient is currently taking no medication currently. They deny any issues with myalgias or history of liver damage from it. They deny any focal numbness or weakness or chest pain.   COPD Patient is coming in for COPD recheck today.  He is currently on no medication.  He has a mild chronic cough but denies any major coughing spells or wheezing spells.  He has 0nighttime symptoms per week and 0daytime symptoms per week currently.  Patient has quit smoking and is doing well with that.  Patient is still having a lot of strength and balance issues and is using a walker but does not want to use a walker and so wants to try physical therapy to see if we get the strength up.  Patient still has a rash and it is not improved and was treated once for scabies and improved but is not gone and then he picks at the spots  Relevant past medical, surgical, family and social history reviewed and updated as indicated. Interim medical history since our last visit reviewed. Allergies and medications reviewed and updated.  Review of Systems  Constitutional: Negative for chills and fever.   Respiratory: Negative for shortness of breath and wheezing.   Cardiovascular: Negative for chest pain and leg swelling.  Musculoskeletal: Positive for gait problem. Negative for back pain.  Skin: Positive for rash.  All other systems reviewed and are negative.   Per HPI unless specifically indicated above   Allergies as of 12/22/2018   No Known Allergies     Medication List       Accurate as of December 22, 2018 10:20 AM. If you have any questions, ask your nurse or doctor.        STOP taking these medications   feeding supplement Liqd Stopped by: Fransisca Kaufmann , MD   valACYclovir 1000 MG tablet Commonly known as: Valtrex Stopped by: Fransisca Kaufmann , MD     TAKE these medications   Acetaminophen Extra Strength 500 MG tablet Generic drug: acetaminophen TAKE 2 TABS BY MOUTH EVERY 4 HRS AS NEEDED FOR HEADACHE/MILD TO MODERATE PAIN OR TEMP OF 100.3 & ABOVE IF TEMP UNRESOLVED AFTER 24 HRS MUST   amLODipine 10 MG tablet Commonly known as: NORVASC TAKE (1) TABLET BY MOUTH ONCE DAILY.   Banophen 25 mg capsule Generic drug: diphenhydrAMINE TAKE 1 CAPSULE BY MOUTH DAILY AS NEEDED FOR ALLERGY SYMPTOMS. CONTACTNURSE IF SYMPTOMS WORSEN.   Breo Ellipta 100-25 MCG/INH Aepb Generic drug: fluticasone furoate-vilanterol INHALE 1 PUFF ONCE DAILY.   Desitin Oint Apply 1 application topically 4 (four) times daily as needed. Started  by: Worthy Rancher, MD   levETIRAcetam 500 MG tablet Commonly known as: KEPPRA TAKE 1 TABLET BY MOUTH TWICE DAILY.   lisinopril 20 MG tablet Commonly known as: ZESTRIL TAKE (1) TABLET BY MOUTH ONCE DAILY. What changed: Another medication with the same name was removed. Continue taking this medication, and follow the directions you see here. Changed by: Fransisca Kaufmann , MD   nicotine 7 mg/24hr patch Commonly known as: NICODERM CQ - dosed in mg/24 hr PLACE 1 PATCH ONTO SKIN ONCE DAILY. START 7MG ONCE 14MG IS USED. What changed: Another  medication with the same name was removed. Continue taking this medication, and follow the directions you see here. Changed by: Fransisca Kaufmann , MD   pantoprazole 40 MG tablet Commonly known as: PROTONIX TAKE (1) TABLET BY MOUTH ONCE DAILY. What changed: See the new instructions.   PARoxetine 20 MG tablet Commonly known as: PAXIL TAKE 1 TABLET BY MOUTH AT BEDTIME.   permethrin 5 % cream Commonly known as: ELIMITE Apply to entire body form neck down. Leave on 8 hours then rinse   triamcinolone cream 0.1 % Commonly known as: KENALOG Apply 1 application topically 2 (two) times daily.        Objective:   BP 136/78   Pulse 62   Temp 98.9 F (37.2 C) (Temporal)   Ht _0  (1.676 m)   Wt 147 lb 3.2 oz (66.8 kg)   BMI 23.76 kg/m   Wt Readings from Last 3 Encounters:  12/22/18 147 lb 3.2 oz (66.8 kg)  09/28/18 148 lb 12.8 oz (67.5 kg)  09/21/18 151 lb (68.5 kg)    Physical Exam Vitals signs and nursing note reviewed.  Constitutional:      General: He is not in acute distress.    Appearance: He is well-developed. He is not diaphoretic.  Eyes:     General: No scleral icterus.    Conjunctiva/sclera: Conjunctivae normal.  Neck:     Musculoskeletal: Neck supple.     Thyroid: No thyromegaly.  Cardiovascular:     Rate and Rhythm: Normal rate and regular rhythm.     Heart sounds: Normal heart sounds. No murmur.  Pulmonary:     Effort: Pulmonary effort is normal. No respiratory distress.     Breath sounds: Normal breath sounds. No wheezing.  Musculoskeletal: Normal range of motion.  Lymphadenopathy:     Cervical: No cervical adenopathy.  Skin:    General: Skin is warm and dry.     Findings: Rash (Scaly patch on lower back in a diamond pattern with excoriations and a few other small areas of papules on upper back and the back of his arms.  No erythema or warmth or drainage) present.  Neurological:     Mental Status: He is alert and oriented to person, place, and time.      Coordination: Coordination normal.  Psychiatric:        Behavior: Behavior normal.       Assessment & Plan:   Problem List Items Addressed This Visit      Cardiovascular and Mediastinum   HTN (hypertension), benign - Primary   Relevant Orders   CMP14+EGFR (Completed)     Respiratory   COPD (chronic obstructive pulmonary disease) (HCC)     Genitourinary   CKD (chronic kidney disease), stage II   Relevant Orders   CMP14+EGFR (Completed)     Other   Hyperlipidemia LDL goal <130   Relevant Orders   Lipid panel (Completed)  Other Visit Diagnoses    Balance disorder       Relevant Orders   Ambulatory referral to Physical Therapy   At high risk for injury related to fall       Relevant Orders   Ambulatory referral to Physical Therapy   Scabies       Relevant Medications   Diaper Rash Products (DESITIN) OINT   permethrin (ELIMITE) 5 % cream   triamcinolone cream (KENALOG) 0.1 %   Atopic dermatitis, unspecified type       Relevant Medications   Diaper Rash Products (DESITIN) OINT   permethrin (ELIMITE) 5 % cream   triamcinolone cream (KENALOG) 0.1 %    Will give creams including Desitin and another round of permethrin and triamcinolone to help try and stop him from itching and scratching and digging at the spots that he pulls up.  We will do a referral to physical therapy because of increasing weakness  Follow up plan: Return in about 6 months (around 06/21/2019), or if symptoms worsen or fail to improve, for Hypertension cholesterol.  Counseling provided for all of the vaccine components Orders Placed This Encounter  Procedures  . CMP14+EGFR  . Lipid panel  . Ambulatory referral to Physical Therapy    Caryl Pina, MD [place] 12/22/2018, 10:20 AM

## 2018-12-29 ENCOUNTER — Ambulatory Visit: Payer: Medicare Other | Admitting: Physical Therapy

## 2018-12-29 ENCOUNTER — Ambulatory Visit (INDEPENDENT_AMBULATORY_CARE_PROVIDER_SITE_OTHER): Payer: Medicare Other | Admitting: Family Medicine

## 2018-12-29 ENCOUNTER — Encounter: Payer: Self-pay | Admitting: Family Medicine

## 2018-12-29 DIAGNOSIS — B86 Scabies: Secondary | ICD-10-CM | POA: Diagnosis not present

## 2018-12-29 DIAGNOSIS — R21 Rash and other nonspecific skin eruption: Secondary | ICD-10-CM | POA: Diagnosis not present

## 2018-12-29 NOTE — Progress Notes (Signed)
Virtual Visit via telephone Note  I connected with Michael Mercado on 12/29/18 at 1325 by telephone and verified that I am speaking with the correct person using two identifiers. Michael CunasCharles E Buhman is currently located at home and [place], caregiver are currently with her during visit. The provider, Elige RadonJoshua A Dettinger, MD is located in their office at time of visit.  Call ended at 1333  I discussed the limitations, risks, security and privacy concerns of performing an evaluation and management service by telephone and the availability of in person appointments. I also discussed with the patient that there may be a patient responsible charge related to this service. The patient expressed understanding and agreed to proceed.   History and Present Illness: Patient is calling in for continued issues with a rash that has come back on his right inner thigh that has raised and clustered bumps. It has some scabbed over areas and just came up since yesterday. She has permethrin that she has and gave it yesterday. Gorilla glue would not typically cause a systemic reaction but only in the spot that it was put on his body.  No diagnosis found.  Outpatient Encounter Medications as of 12/29/2018  Medication Sig  . ACETAMINOPHEN EXTRA STRENGTH 500 MG tablet TAKE 2 TABS BY MOUTH EVERY 4 HRS AS NEEDED FOR HEADACHE/MILD TO MODERATE PAIN OR TEMP OF 100.3 & ABOVE IF TEMP UNRESOLVED AFTER 24 HRS MUST  . amLODipine (NORVASC) 10 MG tablet TAKE (1) TABLET BY MOUTH ONCE DAILY.  [name]. BANOPHEN 25 MG capsule TAKE 1 CAPSULE BY MOUTH DAILY AS NEEDED FOR ALLERGY SYMPTOMS. CONTACTNURSE IF SYMPTOMS WORSEN.  [name]. BREO ELLIPTA 100-25 MCG/INH AEPB INHALE 1 PUFF ONCE DAILY.  [name]. Diaper Rash Products (DESITIN) OINT Apply 1 application topically 4 (four) times daily as needed.  . levETIRAcetam (KEPPRA) 500 MG tablet TAKE 1 TABLET BY MOUTH TWICE DAILY.  [name]. lisinopril (ZESTRIL) 20 MG tablet TAKE (1) TABLET BY MOUTH ONCE DAILY.  .  nicotine (NICODERM CQ - DOSED IN MG/24 HR) 7 mg/24hr patch PLACE 1 PATCH ONTO SKIN ONCE DAILY. START 7MG  ONCE 14MG  IS USED.  [name]. pantoprazole (PROTONIX) 40 MG tablet TAKE (1) TABLET BY MOUTH ONCE DAILY. (Patient taking differently: Take 40 mg by mouth daily. )  . PARoxetine (PAXIL) 20 MG tablet TAKE 1 TABLET BY MOUTH AT BEDTIME.  [name]. permethrin (ELIMITE) 5 % cream Apply to entire body form neck down. Leave on 8 hours then rinse  . triamcinolone cream (KENALOG) 0.1 % Apply 1 application topically 2 (two) times daily.   No facility-administered encounter medications on file as of 12/29/2018.     Review of Systems  Constitutional: Negative for chills and fever.  Eyes: Negative for visual disturbance.  Respiratory: Negative for shortness of breath and wheezing.   Cardiovascular: Negative for chest pain and leg swelling.  Musculoskeletal: Negative for back pain and gait problem.  Skin: Positive for rash. Negative for color change and wound.  All other systems reviewed and are negative.   Observations/Objective: Patient sounds comfortable and in no acute distress  Assessment and Plan: Problem List Items Addressed This Visit    None    Visit Diagnoses    Scabies    -  Primary   Papular rash       Relevant Orders   Ambulatory referral to Dermatology       Follow Up Instructions: Follow up as needed Continue Desitin and permethrin on the new sites will do future pathology because he  does not seem to be healing.   I discussed the assessment and treatment plan with the patient. The patient was provided an opportunity to ask questions and all were answered. The patient agreed with the plan and demonstrated an understanding of the instructions.   The patient was advised to call back or seek an in-person evaluation if the symptoms worsen or if the condition fails to improve as anticipated.  The above assessment and management plan was discussed with the patient. The patient verbalized  understanding of and has agreed to the management plan. Patient is aware to call the clinic if symptoms persist or worsen. Patient is aware when to return to the clinic for a follow-up visit. Patient educated on when it is appropriate to go to the emergency department.    I provided 8 minutes of non-face-to-face time during this encounter.    Worthy Rancher, MD

## 2019-01-05 ENCOUNTER — Encounter: Payer: Self-pay | Admitting: Family Medicine

## 2019-01-06 ENCOUNTER — Other Ambulatory Visit: Payer: Self-pay | Admitting: Physician Assistant

## 2019-01-24 ENCOUNTER — Encounter: Payer: Self-pay | Admitting: Family Medicine

## 2019-02-15 ENCOUNTER — Encounter: Payer: Self-pay | Admitting: Family Medicine

## 2019-02-15 DIAGNOSIS — R21 Rash and other nonspecific skin eruption: Secondary | ICD-10-CM

## 2019-02-27 ENCOUNTER — Other Ambulatory Visit: Payer: Self-pay

## 2019-02-28 ENCOUNTER — Telehealth: Payer: Self-pay | Admitting: Family Medicine

## 2019-02-28 ENCOUNTER — Ambulatory Visit (INDEPENDENT_AMBULATORY_CARE_PROVIDER_SITE_OTHER): Payer: Medicare Other

## 2019-02-28 DIAGNOSIS — Z23 Encounter for immunization: Secondary | ICD-10-CM | POA: Diagnosis not present

## 2019-02-28 NOTE — Telephone Encounter (Signed)
Placed referral again

## 2019-02-28 NOTE — Chronic Care Management (AMB) (Signed)
Chronic Care Management   Note  02/28/2019 Name: BRIER REID MRN: [id number] DOB: 12/20/1940  MUAZ SHOREY is a 78 y.o. year old male who is a primary care patient of Dettinger, Fransisca Kaufmann, MD. I reached out to Azell Der by phone today in response to a referral sent by Mr. Dezmen Alcock Culmer's health plan.     Mr. Scheiber was given information about Chronic Care Management services today including:  1. CCM service includes personalized support from designated clinical staff supervised by his physician, including individualized plan of care and coordination with other care providers 2. 24/7 contact phone numbers for assistance for urgent and routine care needs. 3. Service will only be billed when office clinical staff spend 20 minutes or more in a month to coordinate care. 4. Only one practitioner may furnish and bill the service in a calendar month. 5. The patient may stop CCM services at any time (effective at the end of the month) by phone call to the office staff. 6. The patient will be responsible for cost sharing (co-pay) of up to 20% of the service fee (after annual deductible is met).  Patients group home manager did not agree to enrollment in care management services and does not wish to consider at this time.  Follow up plan: The patient has been provided with contact information for the chronic care management team and has been advised to call with any health related questions or concerns.   [place]  [email or web address]   ??[phone]

## 2019-03-07 ENCOUNTER — Ambulatory Visit: Payer: Medicare Other | Attending: Family Medicine | Admitting: Physical Therapy

## 2019-03-07 ENCOUNTER — Encounter: Payer: Self-pay | Admitting: Physical Therapy

## 2019-03-07 ENCOUNTER — Other Ambulatory Visit: Payer: Self-pay

## 2019-03-07 DIAGNOSIS — R2689 Other abnormalities of gait and mobility: Secondary | ICD-10-CM | POA: Diagnosis not present

## 2019-03-07 DIAGNOSIS — R2681 Unsteadiness on feet: Secondary | ICD-10-CM | POA: Diagnosis not present

## 2019-03-07 DIAGNOSIS — R296 Repeated falls: Secondary | ICD-10-CM | POA: Diagnosis not present

## 2019-03-07 NOTE — Therapy (Signed)
[facility] [place], [place], [address] Phone: [phone]   Fax:  [phone]  Physical Therapy Evaluation  Patient Details  Name: Michael Mercado MRN: [id number] Date of Birth: [birth year] Referring Provider (PT): Caryl Pina, MD   Encounter Date: 03/07/2019  PT End of Session - 03/07/19 1437    Visit Number  1    Number of Visits  12    Date for PT Re-Evaluation  04/25/19    Authorization Type  Progress note every 10th visit    PT Start Time  1340    PT Stop Time  1409    PT Time Calculation (min)  29 min    Activity Tolerance  Patient tolerated treatment well    Behavior During Therapy  [facility] for tasks assessed/performed       Past Medical History:  Diagnosis Date  . Hypercholesteremia   . Hypertension   . Moderate intellectual disability   . Mood disorder ([place])   . MR (mental retardation)   . Obesity     Past Surgical History:  Procedure Laterality Date  . CRANIOTOMY Left 07/07/2018   Procedure: CRANIOTOMY HEMATOMA EVACUATION SUBDURAL;  Surgeon: Eustace Moore, MD;  Location: [place];  Service: Neurosurgery;  Laterality: Left;  . CRANIOTOMY Left 07/13/2018   Procedure: Redo Left CRANIOTOMY FOR SUBDURAL HEMATOMA;  Surgeon: Eustace Moore, MD;  Location: [place];  Service: Neurosurgery;  Laterality: Left;    There were no vitals filed for this visit.   Subjective Assessment - 03/07/19 1433    Subjective  COVID-19 screening performed upon arrival. Patient arrives to physical therapy with caregiver with reports of a history of a fall and balance deficits. Patient's caregiver reports a fall in the bathroom about March 2020 which required a craniotomy and approximately a month long stay in the hospital. Patient's caregiver reports having skilled home health physical therapy in the group home he lives in. Patient reports no falls recently and reports he uses his rollator to ambulate around the group home. Patient  denies pain. Patient's care giver reports supervision is provided ADLs, and showering activities. Patient's caregiver reports 95% of the time his sit to stand transitions are safe. Patient's and care giver's goals are to improve movement.    Patient is accompained by:  Family member   Manuela Schwartz, caregiver.   Pertinent History  Mental retardation, moderate intellectual disability, history of craniotomy 07/07/2018, history of falls    Limitations  Walking;House hold activities    Patient Stated Goals  move better    Currently in Pain?  No/denies         [facility] PT Assessment - 03/07/19 0001      Assessment   Medical Diagnosis  Balance disorder; at high risk for injury related to fall    Referring Provider (PT)  Caryl Pina, MD    Onset Date/Surgical Date  --   07/06/2018 most recent fall   Next MD Visit  January 2021    Prior Therapy  home health per caregiver      Precautions   Precautions  Fall      Restrictions   Weight Bearing Restrictions  No      Balance Screen   Has the patient fallen in the past 6 months  Yes    How many times?  1    Has the patient had a decrease in activity level because of a fear of falling?   No    Is the  patient reluctant to leave their home because of a fear of falling?   No      Home Environment   Living Environment  Group home    Additional Comments  supervision and assistance provided frequently      Prior Function   Level of Independence  Needs assistance with ADLs;Independent with household mobility with device;Independent with transfers    Comments  supervision and assistance for showers and bathing activities      ROM / Strength   AROM / PROM / Strength  Strength      Strength   Overall Strength  Due to impaired cognition    Overall Strength Comments  bilat knee MMT grossly assessed due to mental distability 3/5 in all planes       Special Tests   Other special tests  (+) Romberg for balance      Transfers   Five time sit to stand  comments   modified with coaching and raised plinth; 31 seconds      Ambulation/Gait   Assistive device  Rollator    Gait Pattern  Step-through pattern;Decreased stride length;Poor foot clearance - left;Poor foot clearance - right;Trunk flexed                Objective measurements completed on examination: See above findings.      [facility] Adult PT Treatment/Exercise - 03/07/19 0001      Exercises   Exercises  Knee/Hip      Knee/Hip Exercises: Aerobic   Nustep  Level 3 x5 minutes, monitored             PT Education - 03/07/19 1436    Education Details  SEATED: LAQ, heel raises, hip abduction, pillow squeeze    Person(s) Educated  Patient;Caregiver(s)    Methods  Explanation;Demonstration;Handout    Comprehension  Verbalized understanding;Returned demonstration          PT Long Term Goals - 03/07/19 1447      PT LONG TERM GOAL #1   Title  Patient will report ability to perform ADLs with min A and cuing from caregivers.    Time  6    Period  Weeks    Status  New      PT LONG TERM GOAL #2   Title  Patient will perform modified 5x sit to stand test in 25 seconds with coaching to improve functional LE strength and decrease risk of falls.    Time  6    Period  Weeks    Status  New      PT LONG TERM GOAL #3   Title  Patient will perform nustep for 15 minutes and step rate at 55+ minutes to improve muscular endurance.    Time  6    Period  Weeks    Status  New             Plan - 03/07/19 1443    Clinical Impression Statement  Patient is a 78 year old male who presents to physical therapy with his care giver with decreased bilateral LE MMT, decreased balance, and gait abnormalities. Patient's fall history provided by ER note on 07/06/2018 as caregiver was unsure of mechanism fall (fall getting out of the shower and struck his head). Patient requires constant verbal and visual cuing due to mental disability. Patient's modified 5x sit to stand test of 31  seconds categorizes him as a fall risk. Patient ambulates with a rollator and an increased cadence, decreased bilateral step  lengths and with trunk flexed. Patient and caregiver were educated on HEP as well as goals for PT. All parties in agreement.  Patient would benefit from skilled physical therapy to address deficits and goals.    Personal Factors and Comorbidities  Education;Age;Comorbidity 1    Comorbidities  mental retardation, mild intellectual disability, history of falls and craniotomy 07/06/2018    Examination-Activity Limitations  Locomotion Level;Transfers;Hygiene/Grooming;Bathing    Stability/Clinical Decision Making  Stable/Uncomplicated    Clinical Decision Making  Low    Rehab Potential  Fair    PT Frequency  2x / week    PT Duration  6 weeks    PT Treatment/Interventions  ADLs/Self Care Home Management;Therapeutic activities;Functional mobility training;Therapeutic exercise;Balance training;Stair training;Gait training;Neuromuscular re-education;Patient/family education;Passive range of motion    PT Next Visit Plan  nustep, LE strengthening, core strengthening, gait training, balance activities in sitting and standing pending tolerance.    PT Home Exercise Plan  see patient education section    Consulted and Agree with Plan of Care  Patient;Family member/caregiver    Family Member Consulted  Darl Pikes, Caregiver       Patient will benefit from skilled therapeutic intervention in order to improve the following deficits and impairments:  Decreased activity tolerance, Decreased balance, Decreased strength, Abnormal gait, Decreased safety awareness  Visit Diagnosis: Unsteadiness on feet - Plan: PT plan of care cert/re-cert  Other abnormalities of gait and mobility - Plan: PT plan of care cert/re-cert  Repeated falls - Plan: PT plan of care cert/re-cert     Problem List Patient Active Problem List   Diagnosis Date Noted  . Urinary frequency   . Acute blood loss anemia   .  Leukocytosis   . Labile blood pressure   . Hyperglycemia   . Dysphagia   . Traumatic subdural hematoma (HCC) 07/19/2018  . Subdural hematoma (HCC) 07/07/2018  . Endotracheally intubated 07/07/2018  . Encounter for postanesthesia care 07/07/2018  . CKD (chronic kidney disease), stage II 03/18/2016  . HTN (hypertension), benign 03/18/2015  . COPD (chronic obstructive pulmonary disease) (HCC) 03/18/2015  . Intellectual disability 03/18/2015  . Schizophrenia (HCC) 03/18/2015  . Hyperlipidemia LDL goal <130 03/18/2015    Guss Bunde, PT, DPT 03/07/2019, 3:08 PM  [facility] [address] [place], [place], [address] Phone: [phone]   Fax:  [phone]  Name: Michael Mercado MRN: [id number] Date of Birth: 1941-04-05

## 2019-03-14 ENCOUNTER — Encounter: Payer: Self-pay | Admitting: Physical Therapy

## 2019-03-14 ENCOUNTER — Other Ambulatory Visit: Payer: Self-pay

## 2019-03-14 ENCOUNTER — Ambulatory Visit: Payer: Medicare Other | Admitting: Physical Therapy

## 2019-03-14 DIAGNOSIS — R296 Repeated falls: Secondary | ICD-10-CM

## 2019-03-14 DIAGNOSIS — R2681 Unsteadiness on feet: Secondary | ICD-10-CM

## 2019-03-14 DIAGNOSIS — R2689 Other abnormalities of gait and mobility: Secondary | ICD-10-CM

## 2019-03-14 NOTE — Therapy (Signed)
[facility] [place], [place], [address] Phone: [phone]   Fax:  [phone]  Physical Therapy Treatment  Patient Details  Name: Michael Mercado MRN: [id number] Date of Birth: 07-May-1940 Referring Provider (PT): Caryl Pina, MD   Encounter Date: 03/14/2019  PT End of Session - 03/14/19 0948    Visit Number  2    Number of Visits  12    Date for PT Re-Evaluation  04/25/19    Authorization Type  Progress note every 10th visit    PT Start Time  0946    PT Stop Time  1026    PT Time Calculation (min)  40 min    Equipment Utilized During Treatment  Other (comment)   Rollator   Activity Tolerance  Patient tolerated treatment well    Behavior During Therapy  [facility] for tasks assessed/performed       Past Medical History:  Diagnosis Date  . Hypercholesteremia   . Hypertension   . Moderate intellectual disability   . Mood disorder ([place])   . MR (mental retardation)   . Obesity     Past Surgical History:  Procedure Laterality Date  . CRANIOTOMY Left 07/07/2018   Procedure: CRANIOTOMY HEMATOMA EVACUATION SUBDURAL;  Surgeon: Eustace Moore, MD;  Location: [place];  Service: Neurosurgery;  Laterality: Left;  . CRANIOTOMY Left 07/13/2018   Procedure: Redo Left CRANIOTOMY FOR SUBDURAL HEMATOMA;  Surgeon: Eustace Moore, MD;  Location: [place];  Service: Neurosurgery;  Laterality: Left;    There were no vitals filed for this visit.  Subjective Assessment - 03/14/19 0947    Subjective  COVID 19 screening performed on patient upon arrival. No complaints upon arrival.    Pertinent History  Mental retardation, moderate intellectual disability, history of craniotomy 07/07/2018, history of falls    Limitations  Walking;House hold activities    Patient Stated Goals  move better    Currently in Pain?  No/denies         [facility] PT Assessment - 03/14/19 0001      Assessment   Medical Diagnosis  Balance disorder; at high risk  for injury related to fall    Referring Provider (PT)  Caryl Pina, MD    Onset Date/Surgical Date  07/06/18    Next MD Visit  January 2021    Prior Therapy  home health per caregiver      Precautions   Precautions  Fall      Restrictions   Weight Bearing Restrictions  No                   [facility] Adult PT Treatment/Exercise - 03/14/19 0001      Knee/Hip Exercises: Aerobic   Nustep  L3 x10 min      Knee/Hip Exercises: Seated   Long Arc Quad  Strengthening;Both;15 reps;Weights    Long Arc Quad Weight  3 lbs.    Clamshell with TheraBand  Red   x15 reps   Marching  AROM;Both;15 reps    Hamstring Curl  Strengthening;Both;15 reps;Limitations    Hamstring Limitations  red theraband    Sit to Sand  15 reps;with UE support          Balance Exercises - 03/14/19 1037      Balance Exercises: Standing   Standing Eyes Opened  Narrow base of support (BOS);Foam/compliant surface;Time   x1 min   Step Ups  Forward;6 inch;UE support 2   x20 reps each   Sidestepping  Foam/compliant support;Upper extremity support;4 reps    Numbers 1-15  Static;Foam/compliant surface;1 rep    Heel Raises Limitations  2x10 reps    Toe Raise Limitations  2x10 reps    Other Standing Exercises  Airex standing with head turns             PT Long Term Goals - 03/07/19 1447      PT LONG TERM GOAL #1   Title  Patient will report ability to perform ADLs with min A and cuing from caregivers.    Time  6    Period  Weeks    Status  New      PT LONG TERM GOAL #2   Title  Patient will perform modified 5x sit to stand test in 25 seconds with coaching to improve functional LE strength and decrease risk of falls.    Time  6    Period  Weeks    Status  New      PT LONG TERM GOAL #3   Title  Patient will perform nustep for 15 minutes and step rate at 55+ minutes to improve muscular endurance.    Time  6    Period  Weeks    Status  New            Plan - 03/14/19 1040     Clinical Impression Statement  Patient able to tolerate treatment fairly well athough constant VCs and tactile cues along with demo required to instruct proper technique and sequencing. Patient did report fatigue intermittantly during therex but no other complaints. Patient and caregiver reported compliance with HEP. Good stability noted on floor and on uneven surface. Caregiver reports that most falls occur when he is moving too quickly.PTA instructed caregiver to also correct him at home with sit > stands hand placement and to use stable sit instead of rollator.    Personal Factors and Comorbidities  Education;Age;Comorbidity 1    Comorbidities  mental retardation, mild intellectual disability, history of falls and craniotomy 07/06/2018    Examination-Activity Limitations  Locomotion Level;Transfers;Hygiene/Grooming;Bathing    Stability/Clinical Decision Making  Stable/Uncomplicated    Rehab Potential  Fair    PT Frequency  2x / week    PT Duration  6 weeks    PT Treatment/Interventions  ADLs/Self Care Home Management;Therapeutic activities;Functional mobility training;Therapeutic exercise;Balance training;Stair training;Gait training;Neuromuscular re-education;Patient/family education;Passive range of motion    PT Next Visit Plan  nustep, LE strengthening, core strengthening, gait training, balance activities in sitting and standing pending tolerance.    PT Home Exercise Plan  see patient education section    Consulted and Agree with Plan of Care  Patient;Family member/caregiver    Family Member Consulted  Caregiver       Patient will benefit from skilled therapeutic intervention in order to improve the following deficits and impairments:  Decreased activity tolerance, Decreased balance, Decreased strength, Abnormal gait, Decreased safety awareness  Visit Diagnosis: Unsteadiness on feet  Other abnormalities of gait and mobility  Repeated falls     Problem List Patient Active Problem  List   Diagnosis Date Noted  . Urinary frequency   . Acute blood loss anemia   . Leukocytosis   . Labile blood pressure   . Hyperglycemia   . Dysphagia   . Traumatic subdural hematoma (HCC) 07/19/2018  . Subdural hematoma (HCC) 07/07/2018  . Endotracheally intubated 07/07/2018  . Encounter for postanesthesia care 07/07/2018  . CKD (chronic kidney disease), stage II 03/18/2016  . HTN (hypertension),  benign 03/18/2015  . COPD (chronic obstructive pulmonary disease) (HCC) 03/18/2015  . Intellectual disability 03/18/2015  . Schizophrenia (HCC) 03/18/2015  . Hyperlipidemia LDL goal <130 03/18/2015    Marvell Fuller, PTA 03/14/2019, 11:23 AM  [facility] [address] [place], [place], [address] Phone: [phone]   Fax:  [phone]  Name: Michael Mercado MRN: [id number] Date of Birth: November 24, 1940

## 2019-03-21 ENCOUNTER — Ambulatory Visit: Payer: Medicare Other | Attending: Family Medicine | Admitting: Physical Therapy

## 2019-03-21 ENCOUNTER — Encounter: Payer: Self-pay | Admitting: Physical Therapy

## 2019-03-21 ENCOUNTER — Other Ambulatory Visit: Payer: Self-pay

## 2019-03-21 DIAGNOSIS — R296 Repeated falls: Secondary | ICD-10-CM | POA: Insufficient documentation

## 2019-03-21 DIAGNOSIS — R2689 Other abnormalities of gait and mobility: Secondary | ICD-10-CM | POA: Insufficient documentation

## 2019-03-21 DIAGNOSIS — R2681 Unsteadiness on feet: Secondary | ICD-10-CM | POA: Insufficient documentation

## 2019-03-21 NOTE — Therapy (Signed)
[facility] [address] [place], [place], [address] Phone: [phone]   Fax:  [phone]  Physical Therapy Treatment  Patient Details  Name: Michael Mercado MRN: [id number] Date of Birth: [birth year] Referring Provider (PT): Arville Care, MD   Encounter Date: 03/21/2019  PT End of Session - 03/21/19 0958    Visit Number  3    Number of Visits  12    Date for PT Re-Evaluation  04/25/19    Authorization Type  Progress note every 10th visit    PT Start Time  0947    PT Stop Time  1028    PT Time Calculation (min)  41 min    Equipment Utilized During Treatment  Other (comment)   Rollator   Activity Tolerance  Patient tolerated treatment well    Behavior During Therapy  [facility] for tasks assessed/performed       Past Medical History:  Diagnosis Date  . Hypercholesteremia   . Hypertension   . Moderate intellectual disability   . Mood disorder (HCC)   . MR (mental retardation)   . Obesity     Past Surgical History:  Procedure Laterality Date  . CRANIOTOMY Left 07/07/2018   Procedure: CRANIOTOMY HEMATOMA EVACUATION SUBDURAL;  Surgeon: Tia Alert, MD;  Location: [facility] OR;  Service: Neurosurgery;  Laterality: Left;  . CRANIOTOMY Left 07/13/2018   Procedure: Redo Left CRANIOTOMY FOR SUBDURAL HEMATOMA;  Surgeon: Tia Alert, MD;  Location: [facility] OR;  Service: Neurosurgery;  Laterality: Left;    There were no vitals filed for this visit.  Subjective Assessment - 03/21/19 0957    Subjective  COVID 19 screening performed on patient upon arrival. Reports fall on Saturday with abrasions on his wrists and forearms. Also has abrasion on top of his head but caregiver reports that is from him picking at his incision.    Patient is accompained by:  Family member   Cargiver from group home   Pertinent History  Mental retardation, moderate intellectual disability, history of craniotomy 07/07/2018, history of falls    Limitations   Walking;House hold activities    Patient Stated Goals  move better    Currently in Pain?  No/denies         [facility] PT Assessment - 03/21/19 0001      Assessment   Medical Diagnosis  Balance disorder; at high risk for injury related to fall    Referring Provider (PT)  Arville Care, MD    Onset Date/Surgical Date  07/06/18    Next MD Visit  January 2021    Prior Therapy  home health per caregiver      Precautions   Precautions  Fall      Restrictions   Weight Bearing Restrictions  No                   [facility] Adult PT Treatment/Exercise - 03/21/19 0001      Knee/Hip Exercises: Aerobic   Nustep  L5 x12 min      Knee/Hip Exercises: Seated   Long Arc Quad  Strengthening;Both;20 reps;Weights    Long Arc Quad Weight  3 lbs.    Ball Squeeze  x10 reps    Marching  AROM;Both;15 reps    Hamstring Curl  Strengthening;Both;15 reps;Limitations    Hamstring Limitations  red theraband    Sit to Sand  15 reps;without UE support             PT Education - 03/21/19 1034  Education Details  HEP- LAQ, hip abduction, marching    Person(s) Educated  Patient    Methods  Handout    Comprehension  Verbalized understanding          PT Long Term Goals - 03/07/19 1447      PT LONG TERM GOAL #1   Title  Patient will report ability to perform ADLs with min A and cuing from caregivers.    Time  6    Period  Weeks    Status  New      PT LONG TERM GOAL #2   Title  Patient will perform modified 5x sit to stand test in 25 seconds with coaching to improve functional LE strength and decrease risk of falls.    Time  6    Period  Weeks    Status  New      PT LONG TERM GOAL #3   Title  Patient will perform nustep for 15 minutes and step rate at 55+ minutes to improve muscular endurance.    Time  6    Period  Weeks    Status  New            Plan - 03/21/19 1115    Clinical Impression Statement  Patient presented in clinic with report of a fall over the weekend  with abrasions on L forearm. Patient required more guidance along with demo and VCs to allow for proper technique. Patient very distracted by clinic enviroment. Patient requested new printout of HEP.    Personal Factors and Comorbidities  Education;Age;Comorbidity 1    Comorbidities  mental retardation, moderate intellectual disability, history of falls and craniotomy 07/06/2018    Examination-Activity Limitations  Locomotion Level;Transfers;Hygiene/Grooming;Bathing    Stability/Clinical Decision Making  Stable/Uncomplicated    Rehab Potential  Fair    PT Frequency  2x / week    PT Duration  6 weeks    PT Treatment/Interventions  ADLs/Self Care Home Management;Therapeutic activities;Functional mobility training;Therapeutic exercise;Balance training;Stair training;Gait training;Neuromuscular re-education;Patient/family education;Passive range of motion    PT Next Visit Plan  nustep, LE strengthening, core strengthening, gait training, balance activities in sitting and standing pending tolerance.    PT Home Exercise Plan  see patient education section    Consulted and Agree with Plan of Care  Patient       Patient will benefit from skilled therapeutic intervention in order to improve the following deficits and impairments:  Decreased activity tolerance, Decreased balance, Decreased strength, Abnormal gait, Decreased safety awareness  Visit Diagnosis: Unsteadiness on feet  Other abnormalities of gait and mobility  Repeated falls     Problem List Patient Active Problem List   Diagnosis Date Noted  . Urinary frequency   . Acute blood loss anemia   . Leukocytosis   . Labile blood pressure   . Hyperglycemia   . Dysphagia   . Traumatic subdural hematoma ([place]) 07/19/2018  . Subdural hematoma ([place]) 07/07/2018  . Endotracheally intubated 07/07/2018  . Encounter for postanesthesia care 07/07/2018  . CKD (chronic kidney disease), stage II 03/18/2016  . HTN (hypertension), benign 03/18/2015   . COPD (chronic obstructive pulmonary disease) ([place]) 03/18/2015  . Intellectual disability 03/18/2015  . Schizophrenia ([place]) 03/18/2015  . Hyperlipidemia LDL goal <130 03/18/2015    Standley Brooking, PTA 03/21/2019, 11:29 AM  [facility] [address] [place], [place], [address] Phone: [phone]   Fax:  [phone]  Name: Michael Mercado MRN: [id number] Date of Birth: 12/26/1940

## 2019-03-23 ENCOUNTER — Other Ambulatory Visit: Payer: Self-pay

## 2019-03-23 ENCOUNTER — Ambulatory Visit: Payer: Medicare Other | Admitting: Physical Therapy

## 2019-03-23 ENCOUNTER — Encounter: Payer: Self-pay | Admitting: Physical Therapy

## 2019-03-23 DIAGNOSIS — R2681 Unsteadiness on feet: Secondary | ICD-10-CM | POA: Diagnosis not present

## 2019-03-23 DIAGNOSIS — R296 Repeated falls: Secondary | ICD-10-CM

## 2019-03-23 DIAGNOSIS — R2689 Other abnormalities of gait and mobility: Secondary | ICD-10-CM

## 2019-03-23 NOTE — Therapy (Signed)
[facility] [address] [place], [place], [address] Phone: [phone]   Fax:  [phone]  Physical Therapy Treatment  Patient Details  Name: Michael Mercado MRN: [id number] Date of Birth: 03/29/1941 Referring Provider (PT): Arville CareJoshua Dettinger, MD   Encounter Date: 03/23/2019  PT End of Session - 03/23/19 0959    Visit Number  4    Number of Visits  12    Date for PT Re-Evaluation  04/25/19    Authorization Type  Progress note every 10th visit    PT Start Time  0948    PT Stop Time  1029    PT Time Calculation (min)  41 min    Equipment Utilized During Treatment  Other (comment)   Rollator   Activity Tolerance  Patient tolerated treatment well    Behavior During Therapy  [facility] for tasks assessed/performed       Past Medical History:  Diagnosis Date  . Hypercholesteremia   . Hypertension   . Moderate intellectual disability   . Mood disorder (HCC)   . MR (mental retardation)   . Obesity     Past Surgical History:  Procedure Laterality Date  . CRANIOTOMY Left 07/07/2018   Procedure: CRANIOTOMY HEMATOMA EVACUATION SUBDURAL;  Surgeon: Tia AlertJones, David S, MD;  [facility];  Service: Neurosurgery;  Laterality: Left;  . CRANIOTOMY Left 07/13/2018   Procedure: Redo Left CRANIOTOMY FOR SUBDURAL HEMATOMA;  Surgeon: Tia AlertJones, David S, MD;  [facility];  Service: Neurosurgery;  Laterality: Left;    There were no vitals filed for this visit.  Subjective Assessment - 03/23/19 0950    Subjective  COVID 19 screening performed on patient upon arrival. Cargiver denies any new falls since this past weekend.    Patient is accompained by:  Family member   Caretaker   Pertinent History  Mental retardation, moderate intellectual disability, history of craniotomy 07/07/2018, history of falls    Limitations  Walking;House hold activities    Patient Stated Goals  move better    Currently in Pain?  No/denies         [facility] PT Assessment -  03/23/19 0001      Assessment   Medical Diagnosis  Balance disorder; at high risk for injury related to fall    Referring Provider (PT)  Arville CareJoshua Dettinger, MD    Onset Date/Surgical Date  07/06/18    Next MD Visit  January 2021    Prior Therapy  home health per caregiver      Precautions   Precautions  Fall      Restrictions   Weight Bearing Restrictions  No                   [facility] Adult PT Treatment/Exercise - 03/23/19 0001      Knee/Hip Exercises: Aerobic   Nustep  L5 x15 min      Knee/Hip Exercises: Standing   Hip Flexion  AROM;Both;2 sets;10 reps;Knee bent    Hip Abduction  AROM;Both;20 reps;Knee straight      Knee/Hip Exercises: Seated   Long Arc Quad  Strengthening;Both;20 reps;Weights    Long Arc Quad Weight  3 lbs.    Clamshell with TheraBand  Red   x20 reps   Marching  AROM;Both;20 reps    Hamstring Curl  Strengthening;Both;Limitations;15 reps    Hamstring Limitations  red theraband    Sit to Sand  10 reps;without UE support          Balance Exercises -  03/23/19 1042      Balance Exercises: Standing   Standing Eyes Opened  Narrow base of support (BOS);Foam/compliant surface;Cognitive challenge   head turns   Tandem Stance  Eyes open;Foam/compliant surface;Time             PT Long Term Goals - 03/07/19 1447      PT LONG TERM GOAL #1   Title  Patient will report ability to perform ADLs with min A and cuing from caregivers.    Time  6    Period  Weeks    Status  New      PT LONG TERM GOAL #2   Title  Patient will perform modified 5x sit to stand test in 25 seconds with coaching to improve functional LE strength and decrease risk of falls.    Time  6    Period  Weeks    Status  New      PT LONG TERM GOAL #3   Title  Patient will perform nustep for 15 minutes and step rate at 55+ minutes to improve muscular endurance.    Time  6    Period  Weeks    Status  New            Plan - 03/23/19 1043    Clinical Impression  Statement  Patient presented in clinic with no falls since this past weekend. Increased VCs, tactile cues, and demo required to ensure proper technique and to maintain patient's focus. Patient continues to be very distracted by clinic enviroment. Patient not challenged by even surfaces such as the floor for balance activities. No excessive ankle strategy noted during balance activities on uneven surface.    Personal Factors and Comorbidities  Education;Age;Comorbidity 1    Comorbidities  mental retardation, moderate intellectual disability, history of falls and craniotomy 07/06/2018    Examination-Activity Limitations  Locomotion Level;Transfers;Hygiene/Grooming;Bathing    Stability/Clinical Decision Making  Stable/Uncomplicated    Rehab Potential  Fair    PT Frequency  2x / week    PT Duration  6 weeks    PT Treatment/Interventions  ADLs/Self Care Home Management;Therapeutic activities;Functional mobility training;Therapeutic exercise;Balance training;Stair training;Gait training;Neuromuscular re-education;Patient/family education;Passive range of motion    PT Next Visit Plan  Attempt more gait/enviromental balance activities.    PT Home Exercise Plan  see patient education section    Consulted and Agree with Plan of Care  Patient    Family Member Consulted  Caregiver       Patient will benefit from skilled therapeutic intervention in order to improve the following deficits and impairments:  Decreased activity tolerance, Decreased balance, Decreased strength, Abnormal gait, Decreased safety awareness  Visit Diagnosis: Unsteadiness on feet  Other abnormalities of gait and mobility  Repeated falls     Problem List Patient Active Problem List   Diagnosis Date Noted  . Urinary frequency   . Acute blood loss anemia   . Leukocytosis   . Labile blood pressure   . Hyperglycemia   . Dysphagia   . Traumatic subdural hematoma (HCC) 07/19/2018  . Subdural hematoma (HCC) 07/07/2018  .  Endotracheally intubated 07/07/2018  . Encounter for postanesthesia care 07/07/2018  . CKD (chronic kidney disease), stage II 03/18/2016  . HTN (hypertension), benign 03/18/2015  . COPD (chronic obstructive pulmonary disease) (HCC) 03/18/2015  . Intellectual disability 03/18/2015  . Schizophrenia (HCC) 03/18/2015  . Hyperlipidemia LDL goal <130 03/18/2015    Marvell Fuller, PTA 03/23/2019, 10:46 AM  [facility]  [place], [place], [address] Phone: [phone]   Fax:  [phone]  Name: Michael Mercado MRN: [id number] Date of Birth: [birth year]

## 2019-03-24 ENCOUNTER — Ambulatory Visit (INDEPENDENT_AMBULATORY_CARE_PROVIDER_SITE_OTHER): Payer: Medicare Other | Admitting: Family Medicine

## 2019-03-24 ENCOUNTER — Other Ambulatory Visit: Payer: Self-pay

## 2019-03-24 ENCOUNTER — Encounter: Payer: Self-pay | Admitting: Family Medicine

## 2019-03-24 ENCOUNTER — Ambulatory Visit (HOSPITAL_COMMUNITY)
Admission: RE | Admit: 2019-03-24 | Discharge: 2019-03-24 | Disposition: A | Discharge status: Home / Self Care | Payer: Medicare Other | Source: Ambulatory Visit | Attending: Family Medicine | Admitting: Family Medicine

## 2019-03-24 VITALS — BP 146/76 | HR 69 | Temp 98.5°F | Ht 66.0 in | Wt 143.0 lb

## 2019-03-24 DIAGNOSIS — R600 Localized edema: Secondary | ICD-10-CM

## 2019-03-24 DIAGNOSIS — L03115 Cellulitis of right lower limb: Secondary | ICD-10-CM | POA: Diagnosis not present

## 2019-03-24 MED ORDER — AMOXICILLIN 875 MG PO TABS: 875.0000 mg | ORAL_TABLET | Freq: Two times a day (BID) | ORAL | 0 refills | Status: AC

## 2019-03-24 NOTE — Progress Notes (Signed)
Subjective:  Patient ID: Michael Mercado, male    DOB: Apr 04, 1941  Age: 78 y.o. MRN: [id number]  CC: Edema (right leg started last week)   HPI Michael Mercado presents for painless swelling in the right leg. Onset 1 week ago. Pt. Denies injury. Michael Mercado says it doesn't hurt. Left leg not swollen. Chart shows history of a subdural hematoma due to trauma in March of this year.  Caregiver on phone during visit, leaves midway through the exam to continue her call,  And patient is mentally challenged, therefore history is very sketchy.  Depression screen [facility] 2/9 12/22/2018 09/21/2018 09/07/2018  Decreased Interest 0 0 0  Down, Depressed, Hopeless 0 0 0  PHQ - 2 Score 0 0 0  Altered sleeping - - -  Tired, decreased energy - - -  Change in appetite - - -  Feeling bad or failure about yourself  - - -  Trouble concentrating - - -  Moving slowly or fidgety/restless - - -  Suicidal thoughts - - -  PHQ-9 Score - - -  Difficult doing work/chores - - -    History Michael Mercado has a past medical history of Hypercholesteremia, Hypertension, Moderate intellectual disability, Mood disorder (HCC), MR (mental retardation), and Obesity.   Michael Mercado has a past surgical history that includes Craniotomy (Left, 07/07/2018) and Craniotomy (Left, 07/13/2018).   His family history includes Stroke in his sister.Michael Mercado reports that Michael Mercado quit smoking about 6 months ago. His smoking use included cigarettes. Michael Mercado smoked 0.33 packs per day. Michael Mercado has never used smokeless tobacco. Michael Mercado reports that Michael Mercado does not drink alcohol or use drugs.    ROS Review of Systems  Objective:  BP (!) 146/76   Pulse 69   Temp 98.5 F (36.9 C) (Temporal)   Ht 5\' 6"  (1.676 m)   Wt 143 lb (64.9 kg)   SpO2 99%   BMI 23.08 kg/m   BP Readings from Last 3 Encounters:  03/24/19 (!) 146/76  12/22/18 136/78  09/28/18 138/82    Wt Readings from Last 3 Encounters:  03/24/19 143 lb (64.9 kg)  12/22/18 147 lb 3.2 oz (66.8 kg)  09/28/18 148 lb 12.8 oz (67.5  kg)     Physical Exam Vitals signs reviewed.  Constitutional:      General: Michael Mercado is not in acute distress.    Appearance: Normal appearance. Michael Mercado is normal weight. Michael Mercado is not ill-appearing.  HENT:     Head: Normocephalic.  Cardiovascular:     Pulses: Normal pulses.     Heart sounds: No murmur.  Pulmonary:     Effort: Pulmonary effort is normal.     Breath sounds: Normal breath sounds.  Abdominal:     General: Abdomen is flat.  Musculoskeletal:     Right lower leg: Edema (2-3+) present.     Left lower leg: No edema.  Skin:    General: Skin is warm and dry.     Findings: Erythema (7 cm at right lower extremity just above ankle) present.  Neurological:     Mental Status: Michael Mercado is alert.     Motor: No weakness.     Coordination: Coordination normal.     Gait: Gait normal.  Psychiatric:        Mood and Affect: Mood normal.        Behavior: Behavior normal.       Assessment & Plan:   Michael Mercado was seen today for edema.  Diagnoses and all orders for this visit:  Localized edema -     Ultrasound doppler venous legs bilat; Future -     US Venous Img Lower Unilateral Right; Future  Cellulitis of right lower extremity  Other orders -     amoxicillin (AMOXIL) 875 MG tablet; Take 1 tablet (875 mg total) by mouth 2 (two) times daily for 10 days.       I am having Michael Mercado start on amoxicillin. I am also having him maintain his pantoprazole, levETIRAcetam, Banophen, Acetaminophen Extra Strength, nicotine, Breo Ellipta, PARoxetine, amLODipine, Desitin, permethrin, triamcinolone cream, and lisinopril.  Allergies as of 03/24/2019   No Known Allergies     Medication List       Accurate as of March 24, 2019  7:18 PM. If you have any questions, ask your nurse or doctor.        Acetaminophen Extra Strength 500 MG tablet Generic drug: acetaminophen TAKE 2 TABS BY MOUTH EVERY 4 HRS AS NEEDED FOR HEADACHE/MILD TO MODERATE PAIN OR TEMP OF 100.3 & ABOVE IF TEMP  UNRESOLVED AFTER 24 HRS MUST   amLODipine 10 MG tablet Commonly known as: NORVASC TAKE (1) TABLET BY MOUTH ONCE DAILY.   amoxicillin 875 MG tablet Commonly known as: AMOXIL Take 1 tablet (875 mg total) by mouth 2 (two) times daily for 10 days. Started by: Claretta Fraise, MD   Banophen 25 mg capsule Generic drug: diphenhydrAMINE TAKE 1 CAPSULE BY MOUTH DAILY AS NEEDED FOR ALLERGY SYMPTOMS. CONTACTNURSE IF SYMPTOMS WORSEN.   Breo Ellipta 100-25 MCG/INH Aepb Generic drug: fluticasone furoate-vilanterol INHALE 1 PUFF ONCE DAILY.   Desitin Oint Apply 1 application topically 4 (four) times daily as needed.   levETIRAcetam 500 MG tablet Commonly known as: KEPPRA TAKE 1 TABLET BY MOUTH TWICE DAILY.   lisinopril 20 MG tablet Commonly known as: ZESTRIL TAKE (1) TABLET BY MOUTH ONCE DAILY.   nicotine 7 mg/24hr patch Commonly known as: NICODERM CQ - dosed in mg/24 hr PLACE 1 PATCH ONTO SKIN ONCE DAILY. START 7MG  ONCE 14MG  IS USED.   pantoprazole 40 MG tablet Commonly known as: PROTONIX TAKE (1) TABLET BY MOUTH ONCE DAILY. What changed: See the new instructions.   PARoxetine 20 MG tablet Commonly known as: PAXIL TAKE 1 TABLET BY MOUTH AT BEDTIME.   permethrin 5 % cream Commonly known as: ELIMITE Apply to entire body form neck down. Leave on 8 hours then rinse   triamcinolone cream 0.1 % Commonly known as: KENALOG Apply 1 application topically 2 (two) times daily.        Follow-up: No follow-ups on file.  Claretta Fraise, M.D.

## 2019-03-28 ENCOUNTER — Encounter: Payer: Self-pay | Admitting: Physical Therapy

## 2019-03-28 ENCOUNTER — Ambulatory Visit: Payer: Medicare Other | Admitting: Physical Therapy

## 2019-03-28 ENCOUNTER — Telehealth: Payer: Self-pay

## 2019-03-28 ENCOUNTER — Other Ambulatory Visit: Payer: Self-pay

## 2019-03-28 DIAGNOSIS — R296 Repeated falls: Secondary | ICD-10-CM

## 2019-03-28 DIAGNOSIS — R2689 Other abnormalities of gait and mobility: Secondary | ICD-10-CM

## 2019-03-28 DIAGNOSIS — R2681 Unsteadiness on feet: Secondary | ICD-10-CM

## 2019-03-28 NOTE — Telephone Encounter (Signed)
Please explain message.

## 2019-03-28 NOTE — Therapy (Signed)
[facility] [address] [place], [place], [address] Phone: [phone]   Fax:  [phone]  Physical Therapy Treatment  Patient Details  Name: Michael Mercado MRN: [id number] Date of Birth: [birth year] Referring Provider (PT): Arville Care, MD   Encounter Date: 03/28/2019  PT End of Session - 03/28/19 0955    Visit Number  5    Number of Visits  12    Date for PT Re-Evaluation  04/25/19    Authorization Type  Progress note every 10th visit    PT Start Time  0947    PT Stop Time  1027    PT Time Calculation (min)  40 min    Equipment Utilized During Treatment  Other (comment)   Rollator   Activity Tolerance  Patient tolerated treatment well    Behavior During Therapy  [facility] for tasks assessed/performed       Past Medical History:  Diagnosis Date  . Hypercholesteremia   . Hypertension   . Moderate intellectual disability   . Mood disorder (HCC)   . MR (mental retardation)   . Obesity     Past Surgical History:  Procedure Laterality Date  . CRANIOTOMY Left 07/07/2018   Procedure: CRANIOTOMY HEMATOMA EVACUATION SUBDURAL;  Surgeon: Tia Alert, MD;  Location: [facility] OR;  Service: Neurosurgery;  Laterality: Left;  . CRANIOTOMY Left 07/13/2018   Procedure: Redo Left CRANIOTOMY FOR SUBDURAL HEMATOMA;  Surgeon: Tia Alert, MD;  Location: [facility] OR;  Service: Neurosurgery;  Laterality: Left;    There were no vitals filed for this visit.  Subjective Assessment - 03/28/19 0954    Subjective  COVID 19 screening performed on patient upon arrival. No new complaints.    Patient is accompained by:  Family member   Caregiver from group home   Pertinent History  Mental retardation, moderate intellectual disability, history of craniotomy 07/07/2018, history of falls    Limitations  Walking;House hold activities    Patient Stated Goals  move better    Currently in Pain?  No/denies         [facility] PT Assessment - 03/28/19 0001      Assessment   Medical Diagnosis  Balance disorder; at high risk for injury related to fall    Referring Provider (PT)  Arville Care, MD    Onset Date/Surgical Date  07/06/18    Next MD Visit  January 2021    Prior Therapy  home health per caregiver      Precautions   Precautions  Fall      Restrictions   Weight Bearing Restrictions  No                   [facility] Adult PT Treatment/Exercise - 03/28/19 0001      Knee/Hip Exercises: Aerobic   Nustep  L5 x17 min      Knee/Hip Exercises: Seated   Long Arc Quad  Strengthening;Both;20 reps;Weights    Long Arc Quad Weight  4 lbs.    Clamshell with Carman Ching   x20 reps   Hamstring Curl  Strengthening;Both;Limitations;15 reps    Hamstring Limitations  green theraband    Sit to Sand  5 reps;without UE support   in 23 sec         Balance Exercises - 03/28/19 1043      Balance Exercises: Standing   Sidestepping  Foam/compliant support;Upper extremity support;5 reps    Step Over Hurdles / Cones  step over cones forward and  laterally x10 reps each    Other Standing Exercises  Functional obstacle assessment within hallway with rollator x1 RT, without rollator x1 RT             PT Long Term Goals - 03/28/19 1005      PT LONG TERM GOAL #1   Title  Patient will report ability to perform ADLs with min A and cuing from caregivers.    Time  6    Period  Weeks    Status  Achieved      PT LONG TERM GOAL #2   Title  Patient will perform modified 5x sit to stand test in 25 seconds with coaching to improve functional LE strength and decrease risk of falls.    Time  6    Period  Weeks    Status  Achieved   in 23 seconds     PT LONG TERM GOAL #3   Title  Patient will perform nustep for 15 minutes and step rate at 55+ minutes to improve muscular endurance.    Time  6    Period  Weeks    Status  On-going            Plan - 03/28/19 1044    Clinical Impression Statement  Patient presented in clinic with  no complaints or reports of new falls. Patient's caregiver reports that he is independent with dressing and showering as well as his normal household chores. He is monitored outside if he takes the trash out. Caregiver states that he does require intermittant VCs to use his rollator but if he is observed without AD then he is safe. Able to achieve two LTGs within clinic today. Patient required mod- max multimodal cueing to instruct in proper technique of therex. Patient able to negotitate step over cones forward and laterally well. Patient able to negotiate obstactle course within the hallway with minimal VCs to be aware of his enviroment with and without rollator.    Personal Factors and Comorbidities  Education;Age;Comorbidity 1    Comorbidities  mental retardation, moderate intellectual disability, history of falls and craniotomy 07/06/2018    Examination-Activity Limitations  Locomotion Level;Transfers;Hygiene/Grooming;Bathing    Stability/Clinical Decision Making  Stable/Uncomplicated    Rehab Potential  Fair    PT Frequency  2x / week    PT Duration  6 weeks    PT Treatment/Interventions  ADLs/Self Care Home Management;Therapeutic activities;Functional mobility training;Therapeutic exercise;Balance training;Stair training;Gait training;Neuromuscular re-education;Patient/family education;Passive range of motion    PT Next Visit Plan  Monitor steps per minute on Nustep with D/C.    PT Home Exercise Plan  see patient education section    Consulted and Agree with Plan of Care  Patient    Family Member Consulted  Caregiver       Patient will benefit from skilled therapeutic intervention in order to improve the following deficits and impairments:  Decreased activity tolerance, Decreased balance, Decreased strength, Abnormal gait, Decreased safety awareness  Visit Diagnosis: Unsteadiness on feet  Other abnormalities of gait and mobility  Repeated falls     Problem List Patient Active  Problem List   Diagnosis Date Noted  . Urinary frequency   . Acute blood loss anemia   . Leukocytosis   . Labile blood pressure   . Hyperglycemia   . Dysphagia   . Traumatic subdural hematoma ([place]) 07/19/2018  . Subdural hematoma ([place]) 07/07/2018  . Endotracheally intubated 07/07/2018  . Encounter for postanesthesia care 07/07/2018  . CKD (chronic  kidney disease), stage II 03/18/2016  . HTN (hypertension), benign 03/18/2015  . COPD (chronic obstructive pulmonary disease) (HCC) 03/18/2015  . Intellectual disability 03/18/2015  . Schizophrenia (HCC) 03/18/2015  . Hyperlipidemia LDL goal <130 03/18/2015    Marvell FullerKelsey P Gloriajean Okun, PTA 03/28/2019, 11:35 AM  [facility] [address] [place], [place], [address] Phone: [phone]   Fax:  [phone]  Name: Michael Mercado MRN: [id number] Date of Birth: 11/27/1940

## 2019-03-28 NOTE — Telephone Encounter (Signed)
-----   Message from Worthy Rancher, MD sent at 03/28/2019 12:49 PM EST ----- Can we go ahead and do an order Michael Mercado it bee verbal or written stating that they can discontinue his assistive device

## 2019-03-28 NOTE — Telephone Encounter (Signed)
Sorry that meant to say whether it be verbal or written, they just want in order to discontinue the assistive device

## 2019-03-28 NOTE — Telephone Encounter (Signed)
Attempted to contact- mailboxfull  

## 2019-03-29 NOTE — Telephone Encounter (Signed)
I have script- when they call back please get fax number

## 2019-03-30 ENCOUNTER — Ambulatory Visit: Payer: Medicare Other | Admitting: Physical Therapy

## 2019-03-30 ENCOUNTER — Encounter: Payer: Self-pay | Admitting: Physical Therapy

## 2019-03-30 ENCOUNTER — Other Ambulatory Visit: Payer: Self-pay

## 2019-03-30 DIAGNOSIS — R2681 Unsteadiness on feet: Secondary | ICD-10-CM

## 2019-03-30 DIAGNOSIS — R296 Repeated falls: Secondary | ICD-10-CM

## 2019-03-30 DIAGNOSIS — R2689 Other abnormalities of gait and mobility: Secondary | ICD-10-CM

## 2019-03-30 NOTE — Therapy (Signed)
[facility] [place], [place], [address] Phone: [phone]   Fax:  [phone]  Physical Therapy Treatment PHYSICAL THERAPY DISCHARGE SUMMARY  Visits from Start of Care: 6  Current functional level related to goals / functional outcomes: See below   Remaining deficits: See goals   Education / Equipment: HEP Plan: Patient agrees to discharge.  Patient goals were partially met. Patient is being discharged due to meeting the stated rehab goals.  ?????       Patient Details  Name: Michael Mercado MRN: [id number] Date of Birth: 09-26-1940 Referring Provider (PT): Caryl Pina, MD   Encounter Date: 03/30/2019  PT End of Session - 03/30/19 0952    Visit Number  6    Number of Visits  12    Date for PT Re-Evaluation  04/25/19    Authorization Type  Progress note every 10th visit    PT Start Time  0947    PT Stop Time  1028    PT Time Calculation (min)  41 min    Equipment Utilized During Treatment  Other (comment)   Rollator   Activity Tolerance  Patient tolerated treatment well    Behavior During Therapy  [facility] for tasks assessed/performed       Past Medical History:  Diagnosis Date  . Hypercholesteremia   . Hypertension   . Moderate intellectual disability   . Mood disorder ([place])   . MR (mental retardation)   . Obesity     Past Surgical History:  Procedure Laterality Date  . CRANIOTOMY Left 07/07/2018   Procedure: CRANIOTOMY HEMATOMA EVACUATION SUBDURAL;  Surgeon: Eustace Moore, MD;  Location: [facility];  Service: Neurosurgery;  Laterality: Left;  . CRANIOTOMY Left 07/13/2018   Procedure: Redo Left CRANIOTOMY FOR SUBDURAL HEMATOMA;  Surgeon: Eustace Moore, MD;  Location: [facility];  Service: Neurosurgery;  Laterality: Left;    There were no vitals filed for this visit.  Subjective Assessment - 03/30/19 0952    Subjective  COVID 19 screening performed on patient upon arrival. No new complaints.    Pertinent History  Mental retardation, moderate intellectual disability, history of craniotomy 07/07/2018, history of falls    Limitations  Walking;House hold activities    Patient Stated Goals  move better    Currently in Pain?  No/denies         [facility] PT Assessment - 03/30/19 0001      Assessment   Medical Diagnosis  Balance disorder; at high risk for injury related to fall    Referring Provider (PT)  Caryl Pina, MD    Onset Date/Surgical Date  07/06/18    Next MD Visit  January 2021    Prior Therapy  home health per caregiver      Precautions   Precautions  Fall      Restrictions   Weight Bearing Restrictions  No                   [facility] Adult PT Treatment/Exercise - 03/30/19 0001      Knee/Hip Exercises: Aerobic   Nustep  L5 x20 min      Knee/Hip Exercises: Standing   Heel Raises  Both;20 reps    Hip Abduction  AROM;Both;15 reps;Knee straight    Functional Squat  2 sets;10 reps    Other Standing Knee Exercises  sidestepping at counter x3 RT      Knee/Hip Exercises: Seated   Long Arc Quad  Strengthening;Weights;Both;3 sets;10 reps  Long Arc Quad Weight  4 lbs.    Clamshell with Marga Hoots   x20 reps   Marching  Strengthening;Both;2 sets;10 reps;Weights    Marching Limitations  4    Hamstring Curl  Strengthening;Both;Limitations;15 reps    Hamstring Limitations  green theraband                  PT Long Term Goals - 03/30/19 1005      PT LONG TERM GOAL #1   Title  Patient will report ability to perform ADLs with min A and cuing from caregivers.    Time  6    Period  Weeks    Status  Achieved      PT LONG TERM GOAL #2   Title  Patient will perform modified 5x sit to stand test in 25 seconds with coaching to improve functional LE strength and decrease risk of falls.    Time  6    Period  Weeks    Status  Achieved   in 23 seconds     PT LONG TERM GOAL #3   Title  Patient will perform nustep for 15 minutes and step rate at  55+ minutes to improve muscular endurance.    Time  6    Period  Weeks    Status  Partially Met            Plan - 03/30/19 1050    Clinical Impression Statement  Patient presented in clinic with no complaints upon arrival. Patient able to complete 55 steps/minute on Nustep for most of session although intermittant VCs required. Patient requires mod-max multimodal cueing throughout treatment to redirect attention as well as ensure proper technique. Patient's caregiver notified of goal status and of communication with PCP.    Personal Factors and Comorbidities  Education;Age;Comorbidity 1    Comorbidities  mental retardation, moderate intellectual disability, history of falls and craniotomy 07/06/2018    Examination-Activity Limitations  Locomotion Level;Transfers;Hygiene/Grooming;Bathing    Stability/Clinical Decision Making  Stable/Uncomplicated    Rehab Potential  Fair    PT Frequency  2x / week    PT Duration  6 weeks    PT Treatment/Interventions  ADLs/Self Care Home Management;Therapeutic activities;Functional mobility training;Therapeutic exercise;Balance training;Stair training;Gait training;Neuromuscular re-education;Patient/family education;Passive range of motion    PT Next Visit Plan  D/C summary required.    PT Home Exercise Plan  see patient education section    Consulted and Agree with Plan of Care  Patient    Family Member Consulted  Caregiver       Patient will benefit from skilled therapeutic intervention in order to improve the following deficits and impairments:  Decreased activity tolerance, Decreased balance, Decreased strength, Abnormal gait, Decreased safety awareness  Visit Diagnosis: Unsteadiness on feet  Other abnormalities of gait and mobility  Repeated falls     Problem List Patient Active Problem List   Diagnosis Date Noted  . Urinary frequency   . Acute blood loss anemia   . Leukocytosis   . Labile blood pressure   . Hyperglycemia   .  Dysphagia   . Traumatic subdural hematoma ([place]) 07/19/2018  . Subdural hematoma ([place]) 07/07/2018  . Endotracheally intubated 07/07/2018  . Encounter for postanesthesia care 07/07/2018  . CKD (chronic kidney disease), stage II 03/18/2016  . HTN (hypertension), benign 03/18/2015  . COPD (chronic obstructive pulmonary disease) ([place]) 03/18/2015  . Intellectual disability 03/18/2015  . Schizophrenia ([place]) 03/18/2015  . Hyperlipidemia LDL goal <130 03/18/2015  Standley Brooking, PTA 03/30/19 10:59 AM   [facility] [address] [place], [place], [address] Phone: [phone]   Fax:  [phone]  Name: Michael Mercado MRN: [id number] Date of Birth: 05-12-1940

## 2019-04-03 NOTE — Telephone Encounter (Signed)
Aware. Script faxed to [phone].

## 2019-04-04 ENCOUNTER — Encounter: Payer: Medicare Other | Admitting: Physical Therapy

## 2019-04-06 ENCOUNTER — Encounter: Payer: Medicare Other | Admitting: Physical Therapy

## 2019-04-10 ENCOUNTER — Telehealth: Payer: Self-pay | Admitting: Family Medicine

## 2019-04-10 NOTE — Telephone Encounter (Signed)
Yes okay to go ahead and send him a note that says he no longer needs to go to the dermatologist.

## 2019-04-10 NOTE — Telephone Encounter (Signed)
Letter sent to mychart- [name] aware.

## 2019-05-02 ENCOUNTER — Other Ambulatory Visit: Payer: Self-pay

## 2019-05-02 ENCOUNTER — Ambulatory Visit (INDEPENDENT_AMBULATORY_CARE_PROVIDER_SITE_OTHER): Payer: Medicare Other | Admitting: Family

## 2019-05-02 ENCOUNTER — Encounter: Payer: Self-pay | Admitting: Family Medicine

## 2019-05-02 ENCOUNTER — Encounter: Payer: Self-pay | Admitting: Family

## 2019-05-02 VITALS — BP 125/69 | HR 70 | Temp 98.9°F | Ht 66.0 in | Wt 138.2 lb

## 2019-05-02 DIAGNOSIS — F319 Bipolar disorder, unspecified: Secondary | ICD-10-CM | POA: Diagnosis not present

## 2019-05-02 DIAGNOSIS — R2689 Other abnormalities of gait and mobility: Secondary | ICD-10-CM

## 2019-05-02 DIAGNOSIS — F79 Unspecified intellectual disabilities: Secondary | ICD-10-CM

## 2019-05-02 DIAGNOSIS — M7989 Other specified soft tissue disorders: Secondary | ICD-10-CM | POA: Diagnosis not present

## 2019-05-02 NOTE — Progress Notes (Signed)
Subjective:    Patient ID: Michael Mercado, male    DOB: 05-Jan-1941, 79 y.o.   MRN: [id number]  Chief Complaint  Patient presents with  . Leg Swelling    right lower leg and left foot.  Noticed last night    HPI Pt presents to the office today with recurrent  right lower leg swelling. He had similar symptoms on 03/24/19. He had negative doppler. He was started on Augmentin and his caregiver reports the swelling improved.   PT states he has mild pain in his leg.   Pt is currently living in a group home because he has intellectual disability, schizophrenia. He is a poor historian.   Review of Systems  Unable to perform ROS: Mental status change  Cardiovascular: Positive for leg swelling.  All other systems reviewed and are negative.      Objective:   Physical Exam Vitals reviewed.  Constitutional:      General: He is not in acute distress.    Appearance: He is well-developed.  HENT:     Head: Normocephalic.  Eyes:     General:        Right eye: No discharge.        Left eye: No discharge.     Pupils: Pupils are equal, round, and reactive to light.  Neck:     Thyroid: No thyromegaly.  Cardiovascular:     Rate and Rhythm: Normal rate and regular rhythm.     Heart sounds: Normal heart sounds. No murmur.  Pulmonary:     Effort: Pulmonary effort is normal. No respiratory distress.     Breath sounds: Normal breath sounds. No wheezing.  Abdominal:     General: Bowel sounds are normal. There is no distension.     Palpations: Abdomen is soft.     Tenderness: There is no abdominal tenderness.  Musculoskeletal:        General: No tenderness. Normal range of motion.     Cervical back: Normal range of motion and neck supple.  Skin:    General: Skin is warm and dry.     Findings: No erythema or rash.     Comments: Small scabbed area on medial lower right leg.No redness, 2+ edema, slight warmth present  Neurological:     Mental Status: He is alert and oriented to person,  place, and time.     Cranial Nerves: No cranial nerve deficit.     Deep Tendon Reflexes: Reflexes are normal and symmetric.  Psychiatric:        Behavior: Behavior normal.        Thought Content: Thought content normal.        Cognition and Memory: Cognition is impaired.        Judgment: Judgment is inappropriate.      BP 125/69   Pulse 70   Temp 98.9 F (37.2 C) (Temporal)   Ht 5\' 6"  (1.676 m)   Wt 138 lb 3.2 oz (62.7 kg)   SpO2 99%   BMI 22.31 kg/m       Assessment & Plan:  Michael Mercado comes in today with chief complaint of Leg Swelling (right lower leg and left foot.  Noticed last night)   Diagnosis and orders addressed:  1. Leg swelling - Compression stockings  2. Intellectual disability - Compression stockings  Start wearing compression hose daily No redness or infection present at this time, if redness, increase swelling or warmth develops call office and I will send in  antibiotic.  Keep chronic follow up with PCP  Evelina Dun, FNP

## 2019-05-02 NOTE — Patient Instructions (Signed)
Edema  Edema is when you have too much fluid in your body or under your skin. Edema may make your legs, feet, and ankles swell up. Swelling is also common in looser tissues, like around your eyes. This is a common condition. It gets more common as you get older. There are many possible causes of edema. Eating too much salt (sodium) and being on your feet or sitting for a long time can cause edema in your legs, feet, and ankles. Hot weather may make edema worse. Edema is usually painless. Your skin may look swollen or shiny. Follow these instructions at home:  Keep the swollen body part raised (elevated) above the level of your heart when you are sitting or lying down.  Do not sit still or stand for a long time.  Do not wear tight clothes. Do not wear garters on your upper legs.  Exercise your legs. This can help the swelling go down.  Wear elastic bandages or support stockings as told by your doctor.  Eat a low-salt (low-sodium) diet to reduce fluid as told by your doctor.  Depending on the cause of your swelling, you may need to limit how much fluid you drink (fluid restriction).  Take over-the-counter and prescription medicines only as told by your doctor. Contact a doctor if:  Treatment is not working.  You have heart, liver, or kidney disease and have symptoms of edema.  You have sudden and unexplained weight gain. Get help right away if:  You have shortness of breath or chest pain.  You cannot breathe when you lie down.  You have pain, redness, or warmth in the swollen areas.  You have heart, liver, or kidney disease and get edema all of a sudden.  You have a fever and your symptoms get worse all of a sudden. Summary  Edema is when you have too much fluid in your body or under your skin.  Edema may make your legs, feet, and ankles swell up. Swelling is also common in looser tissues, like around your eyes.  Raise (elevate) the swollen body part above the level of your  heart when you are sitting or lying down.  Follow your doctor's instructions about diet and how much fluid you can drink (fluid restriction). This information is not intended to replace advice given to you by your health care provider. Make sure you discuss any questions you have with your health care provider. Document Revised: 04/09/2017 Document Reviewed: 04/24/2016 Elsevier Patient Education  2020 [employer]  

## 2019-05-03 ENCOUNTER — Telehealth: Payer: Self-pay | Admitting: Family Medicine

## 2019-05-03 NOTE — Telephone Encounter (Signed)
Discontinued Rx refaxed

## 2019-05-17 ENCOUNTER — Other Ambulatory Visit: Payer: Self-pay

## 2019-05-17 ENCOUNTER — Encounter: Payer: Self-pay | Admitting: Family Medicine

## 2019-05-17 ENCOUNTER — Ambulatory Visit (INDEPENDENT_AMBULATORY_CARE_PROVIDER_SITE_OTHER): Payer: Medicare Other | Admitting: Family Medicine

## 2019-05-17 VITALS — BP 137/65 | HR 72 | Temp 98.0°F | Ht 66.0 in | Wt 133.6 lb

## 2019-05-17 DIAGNOSIS — L03113 Cellulitis of right upper limb: Secondary | ICD-10-CM | POA: Diagnosis not present

## 2019-05-17 DIAGNOSIS — L209 Atopic dermatitis, unspecified: Secondary | ICD-10-CM

## 2019-05-17 DIAGNOSIS — F424 Excoriation (skin-picking) disorder: Secondary | ICD-10-CM

## 2019-05-17 MED ORDER — SULFAMETHOXAZOLE-TRIMETHOPRIM 800-160 MG PO TABS: 1.0000 | ORAL_TABLET | Freq: Two times a day (BID) | ORAL | 0 refills | Status: AC

## 2019-05-17 MED ORDER — TRIAMCINOLONE ACETONIDE 0.1 % EX CREA: 1.0000 "application " | TOPICAL_CREAM | Freq: Two times a day (BID) | CUTANEOUS | 1 refills | Status: DC

## 2019-05-17 MED ORDER — CETIRIZINE HCL 10 MG PO TABS: 10.0000 mg | ORAL_TABLET | Freq: Every day | ORAL | 3 refills | Status: DC

## 2019-05-17 NOTE — Progress Notes (Signed)
Assessment & Plan:  1. Cellulitis of right upper extremity - sulfamethoxazole-trimethoprim (BACTRIM DS) 800-160 MG tablet; Take 1 tablet by mouth 2 (two) times daily for 7 days.  Dispense: 14 tablet; Refill: 0  2. Excoriation (skin-picking) disorder - cetirizine (ZYRTEC) 10 MG tablet; Take 1 tablet (10 mg total) by mouth daily.  Dispense: 30 tablet; Refill: 3 - Ambulatory referral to Dermatology  3. Atopic dermatitis, unspecified type - Encouraged use of triamcinolone cream and good thick lotions twice daily. - cetirizine (ZYRTEC) 10 MG tablet; Take 1 tablet (10 mg total) by mouth daily.  Dispense: 30 tablet; Refill: 3 - triamcinolone cream (KENALOG) 0.1 %; Apply 1 application topically 2 (two) times daily.  Dispense: 80 g; Refill: 1 - Ambulatory referral to Dermatology   Follow up plan: Return if symptoms worsen or fail to improve.  Hendricks Limes, MSN, APRN, FNP-C [facility]  Subjective:   Patient ID: Michael Mercado, male    DOB: July 13, 1940, 79 y.o.   MRN: [id number]  HPI: Michael Mercado is a 79 y.o. male presenting on 05/17/2019 for Leg Swelling (both legs) and Arm Swelling  Patient presents today with swelling of bilateral lower extremities and right upper extremity.  He has been seen multiple times for the swelling in his lower extremities.  He does not wear compression stockings although he does have them.  DVT has been ruled out.  Patient has a lot of itching and picks at his skin.  He has triamcinolone cream but has not been using it.  Lotion is applied daily.  Patient has never seen a dermatologist.   ROS: Negative unless specifically indicated above in HPI.   Relevant past medical history reviewed and updated as indicated.   Allergies and medications reviewed and updated.   Current Outpatient Medications:  .  ACETAMINOPHEN EXTRA STRENGTH 500 MG tablet, TAKE 2 TABS BY MOUTH EVERY 4 HRS AS NEEDED FOR HEADACHE/MILD TO MODERATE PAIN OR  TEMP OF 100.3 & ABOVE IF TEMP UNRESOLVED AFTER 24 HRS MUST, Disp: 30 tablet, Rfl: 11 .  amLODipine (NORVASC) 10 MG tablet, TAKE (1) TABLET BY MOUTH ONCE DAILY., Disp: 30 tablet, Rfl: 0 .  BANOPHEN 25 MG capsule, TAKE 1 CAPSULE BY MOUTH DAILY AS NEEDED FOR ALLERGY SYMPTOMS. CONTACTNURSE IF SYMPTOMS WORSEN., Disp: 30 capsule, Rfl: PRN .  BREO ELLIPTA 100-25 MCG/INH AEPB, INHALE 1 PUFF ONCE DAILY., Disp: 60 each, Rfl: 0 .  Diaper Rash Products (DESITIN) OINT, Apply 1 application topically 4 (four) times daily as needed., Disp: 99 g, Rfl: 3 .  levETIRAcetam (KEPPRA) 500 MG tablet, TAKE 1 TABLET BY MOUTH TWICE DAILY., Disp: 60 tablet, Rfl: 11 .  lisinopril (ZESTRIL) 20 MG tablet, TAKE (1) TABLET BY MOUTH ONCE DAILY., Disp: 30 tablet, Rfl: 11 .  pantoprazole (PROTONIX) 40 MG tablet, TAKE (1) TABLET BY MOUTH ONCE DAILY. (Patient taking differently: Take 40 mg by mouth daily. ), Disp: 30 tablet, Rfl: 11 .  PARoxetine (PAXIL) 20 MG tablet, TAKE 1 TABLET BY MOUTH AT BEDTIME., Disp: 30 tablet, Rfl: 0 .  triamcinolone cream (KENALOG) 0.1 %, Apply 1 application topically 2 (two) times daily., Disp: 80 g, Rfl: 1 .  ARIPiprazole (ABILIFY) 2 MG tablet, Take 2 mg by mouth daily., Disp: , Rfl:  .  cetirizine (ZYRTEC) 10 MG tablet, Take 1 tablet (10 mg total) by mouth daily., Disp: 30 tablet, Rfl: 3 .  nicotine (NICODERM CQ - DOSED IN MG/24 HR) 7 mg/24hr patch, PLACE 1 PATCH ONTO SKIN  ONCE DAILY. START 7MG  ONCE 14MG  IS USED. (Patient not taking: Reported on 05/17/2019), Disp: 28 patch, Rfl: 11 .  sulfamethoxazole-trimethoprim (BACTRIM DS) 800-160 MG tablet, Take 1 tablet by mouth 2 (two) times daily for 7 days., Disp: 14 tablet, Rfl: 0  No Known Allergies  Objective:   BP 137/65   Pulse 72   Temp 98 F (36.7 C) (Temporal)   Ht 5\' 6"  (1.676 m)   Wt 133 lb 9.6 oz (60.6 kg)   SpO2 99%   BMI 21.56 kg/m    Physical Exam Vitals reviewed.  Constitutional:      General: He is not in acute distress.     Appearance: Normal appearance. He is not ill-appearing, toxic-appearing or diaphoretic.  HENT:     Head: Normocephalic and atraumatic.  Eyes:     General: No scleral icterus.       Right eye: No discharge.        Left eye: No discharge.     Conjunctiva/sclera: Conjunctivae normal.  Cardiovascular:     Rate and Rhythm: Normal rate.  Pulmonary:     Effort: Pulmonary effort is normal. No respiratory distress.  Musculoskeletal:        General: Normal range of motion.     Cervical back: Normal range of motion.     Right lower leg: 2+ Edema present.     Left lower leg: 1+ Edema present.  Skin:    General: Skin is warm and dry.     Findings: Erythema present.     Comments: Excoriation to bilateral lower extremities and right upper extremity.  Patient has scabs all over including bilateral upper and lower extremities, head, and back.  His right arm is swollen with erythema and warmth.  Does also have dry skin especially on this right upper extremity.  Neurological:     Mental Status: He is alert and oriented to person, place, and time. Mental status is at baseline.  Psychiatric:        Mood and Affect: Mood normal.        Behavior: Behavior normal.        Thought Content: Thought content normal.        Judgment: Judgment normal.

## 2019-05-17 NOTE — Patient Instructions (Signed)

## 2019-05-24 DIAGNOSIS — R296 Repeated falls: Secondary | ICD-10-CM | POA: Diagnosis not present

## 2019-05-24 DIAGNOSIS — I952 Hypotension due to drugs: Secondary | ICD-10-CM | POA: Diagnosis not present

## 2019-05-24 DIAGNOSIS — F0391 Unspecified dementia with behavioral disturbance: Secondary | ICD-10-CM | POA: Diagnosis not present

## 2019-05-24 DIAGNOSIS — Z79899 Other long term (current) drug therapy: Secondary | ICD-10-CM | POA: Diagnosis not present

## 2019-05-25 ENCOUNTER — Other Ambulatory Visit: Payer: Self-pay

## 2019-05-25 ENCOUNTER — Other Ambulatory Visit: Payer: Medicare Other

## 2019-05-25 DIAGNOSIS — F319 Bipolar disorder, unspecified: Secondary | ICD-10-CM | POA: Diagnosis not present

## 2019-05-29 ENCOUNTER — Other Ambulatory Visit: Payer: Medicare Other

## 2019-05-29 DIAGNOSIS — E559 Vitamin D deficiency, unspecified: Secondary | ICD-10-CM | POA: Diagnosis not present

## 2019-05-29 DIAGNOSIS — Z79899 Other long term (current) drug therapy: Secondary | ICD-10-CM | POA: Diagnosis not present

## 2019-05-29 DIAGNOSIS — R809 Proteinuria, unspecified: Secondary | ICD-10-CM | POA: Diagnosis not present

## 2019-05-29 DIAGNOSIS — N1831 Chronic kidney disease, stage 3a: Secondary | ICD-10-CM | POA: Diagnosis not present

## 2019-05-29 DIAGNOSIS — D631 Anemia in chronic kidney disease: Secondary | ICD-10-CM | POA: Diagnosis not present

## 2019-05-31 DIAGNOSIS — D508 Other iron deficiency anemias: Secondary | ICD-10-CM | POA: Diagnosis not present

## 2019-05-31 DIAGNOSIS — D638 Anemia in other chronic diseases classified elsewhere: Secondary | ICD-10-CM | POA: Diagnosis not present

## 2019-05-31 DIAGNOSIS — E559 Vitamin D deficiency, unspecified: Secondary | ICD-10-CM | POA: Diagnosis not present

## 2019-05-31 DIAGNOSIS — E875 Hyperkalemia: Secondary | ICD-10-CM | POA: Diagnosis not present

## 2019-05-31 DIAGNOSIS — N1831 Chronic kidney disease, stage 3a: Secondary | ICD-10-CM | POA: Diagnosis not present

## 2019-06-05 ENCOUNTER — Telehealth: Payer: Self-pay | Admitting: Family Medicine

## 2019-06-05 ENCOUNTER — Other Ambulatory Visit: Payer: Self-pay | Admitting: Family Medicine

## 2019-06-05 DIAGNOSIS — J439 Emphysema, unspecified: Secondary | ICD-10-CM

## 2019-06-05 NOTE — Telephone Encounter (Signed)
Per [name], patient's Amlodipine was discontinued by neurologist.  Since then his blood pressure has been slowly rising.  Wants him to be seen.  Appointment scheduled for 06/09/19 at 11:10 am with Dr. Louanne Skye.

## 2019-06-09 ENCOUNTER — Encounter: Payer: Self-pay | Admitting: Family Medicine

## 2019-06-09 ENCOUNTER — Other Ambulatory Visit: Payer: Self-pay

## 2019-06-09 ENCOUNTER — Ambulatory Visit (INDEPENDENT_AMBULATORY_CARE_PROVIDER_SITE_OTHER): Payer: Medicare Other | Admitting: Family Medicine

## 2019-06-09 DIAGNOSIS — I10 Essential (primary) hypertension: Secondary | ICD-10-CM | POA: Diagnosis not present

## 2019-06-09 MED ORDER — AMLODIPINE BESYLATE 5 MG PO TABS: 5.0000 mg | ORAL_TABLET | Freq: Every day | ORAL | 3 refills | Status: DC

## 2019-06-09 NOTE — Progress Notes (Signed)
Virtual Visit via telephone Note  I connected with Michael Mercado on 06/09/19 at 1110 by telephone and verified that I am speaking with the correct person using two identifiers. Michael Mercado is currently located at home and caregiver [name] are currently with her during visit. The provider, Fransisca Kaufmann Rachit Grim, MD is located in their office at time of visit.  Call ended at 1118  I discussed the limitations, risks, security and privacy concerns of performing an evaluation and management service by telephone and the availability of in person appointments. I also discussed with the patient that there may be a patient responsible charge related to this service. The patient expressed understanding and agreed to proceed.   History and Present Illness: Hypertension Patient is currently on lisinopril, was on amlodipine but it was stopped a couple weeks ago because blood pressure was too far down at the neurology's office., and their blood pressure today is 168/95. Patient denies any lightheadedness or dizziness. Patient denies headaches, blurred vision, chest pains, shortness of breath, or weakness. Denies any side effects from medication and is content with current medication. He went to Dr [name] and his numbers are are going up on the BP.  140/92, 168/95 starting 2 weeks ago when the amlodipine was stopped.  No diagnosis found.  Outpatient Encounter Medications as of 06/09/2019  Medication Sig  . ACETAMINOPHEN EXTRA STRENGTH 500 MG tablet TAKE 2 TABS BY MOUTH EVERY 4 HRS AS NEEDED FOR HEADACHE/MILD TO MODERATE PAIN OR TEMP OF 100.3 & ABOVE IF TEMP UNRESOLVED AFTER 24 HRS MUST  . amLODipine (NORVASC) 10 MG tablet TAKE (1) TABLET BY MOUTH ONCE DAILY.  [name] ARIPiprazole (ABILIFY) 2 MG tablet Take 2 mg by mouth daily.  [name] BANOPHEN 25 MG capsule TAKE 1 CAPSULE BY MOUTH DAILY AS NEEDED FOR ALLERGY SYMPTOMS. CONTACTNURSE IF SYMPTOMS WORSEN.  [name] BREO ELLIPTA 100-25 MCG/INH AEPB INHALE 1 PUFF  ONCE DAILY.  . cetirizine (ZYRTEC) 10 MG tablet Take 1 tablet (10 mg total) by mouth daily.  . Diaper Rash Products (DESITIN) OINT Apply 1 application topically 4 (four) times daily as needed.  . levETIRAcetam (KEPPRA) 500 MG tablet TAKE 1 TABLET BY MOUTH TWICE DAILY.  [name] lisinopril (ZESTRIL) 20 MG tablet TAKE (1) TABLET BY MOUTH ONCE DAILY.  . nicotine (NICODERM CQ - DOSED IN MG/24 HR) 7 mg/24hr patch PLACE 1 PATCH ONTO SKIN ONCE DAILY. START 7MG  ONCE 14MG  IS USED. (Patient not taking: Reported on 05/17/2019)  . pantoprazole (PROTONIX) 40 MG tablet TAKE (1) TABLET BY MOUTH ONCE DAILY.  [name] PARoxetine (PAXIL) 20 MG tablet TAKE 1 TABLET BY MOUTH AT BEDTIME.  [name] triamcinolone cream (KENALOG) 0.1 % Apply 1 application topically 2 (two) times daily.   No facility-administered encounter medications on file as of 06/09/2019.    Review of Systems  Constitutional: Negative for chills and fever.  Respiratory: Negative for shortness of breath and wheezing.   Cardiovascular: Negative for chest pain and leg swelling.  Musculoskeletal: Negative for back pain and gait problem.  Skin: Negative for rash.  Neurological: Negative for dizziness and headaches.  All other systems reviewed and are negative.   Observations/Objective: Visit was with patient's caretaker [name]  Assessment and Plan: Problem List Items Addressed This Visit      Cardiovascular and Mediastinum   HTN (hypertension), benign - Primary   Relevant Medications   amLODipine (NORVASC) 5 MG tablet    Other Visit Diagnoses    Uncontrolled hypertension  Relevant Medications   amLODipine (NORVASC) 5 MG tablet      Will restart amlodipine at a lower dose at 5 mg instead of 10 to see if that helps not dropping too much but not let his blood pressure creep up.  They will call me in some blood pressure numbers over the next couple weeks Follow up plan: Return if symptoms worsen or fail to improve.     I discussed the assessment  and treatment plan with the patient. The patient was provided an opportunity to ask questions and all were answered. The patient agreed with the plan and demonstrated an understanding of the instructions.   The patient was advised to call back or seek an in-person evaluation if the symptoms worsen or if the condition fails to improve as anticipated.  The above assessment and management plan was discussed with the patient. The patient verbalized understanding of and has agreed to the management plan. Patient is aware to call the clinic if symptoms persist or worsen. Patient is aware when to return to the clinic for a follow-up visit. Patient educated on when it is appropriate to go to the emergency department.    I provided 8 minutes of non-face-to-face time during this encounter.    [name], MD

## 2019-06-13 ENCOUNTER — Ambulatory Visit (INDEPENDENT_AMBULATORY_CARE_PROVIDER_SITE_OTHER): Payer: Medicare Other | Admitting: *Deleted

## 2019-06-13 DIAGNOSIS — Z0000 Encounter for general adult medical examination without abnormal findings: Secondary | ICD-10-CM

## 2019-06-13 NOTE — Progress Notes (Addendum)
MEDICARE ANNUAL WELLNESS VISIT  06/13/2019  Telephone Visit Disclaimer This Medicare AWV was conducted by telephone due to national recommendations for restrictions regarding the COVID-19 Pandemic (e.g. social distancing).  I verified, using two identifiers, that I am speaking with Michael Mercado or their authorized healthcare agent. I discussed the limitations, risks, security, and privacy concerns of performing an evaluation and management service by telephone and the potential availability of an in-person appointment in the future. The patient expressed understanding and agreed to proceed.   Subjective:  Michael Mercado is a 79 y.o. male patient of Dettinger, Fransisca Kaufmann, MD who had a Medicare Annual Wellness Visit today via telephone. Michael Mercado is Disabled and lives in a group home. he has no children. he reports that he is socially active and does interact with friends/family regularly. he is not physically active and enjoys playing basketball.  Patient Care Team: Dettinger, Fransisca Kaufmann, MD as PCP - General (Family Medicine) Fran Lowes, MD (Inactive) as Consulting Physician (Nephrology) Alvy Bimler, MD as Referring Physician (Neurology)  Advanced Directives 06/13/2019 03/07/2019 09/05/2018 08/25/2018 07/20/2018 07/19/2018 07/06/2018  Does Patient Have a Medical Advance Directive? No No No No - No No  Would patient like information on creating a medical advance directive? No - Patient declined - No - Patient declined No - Patient declined No - Patient declined - -    Hospital Utilization Over the Past 12 Months: # of hospitalizations or ER visits: 1 # of surgeries: 1  Review of Systems    Patient reports that his overall health is unchanged compared to last year.  History obtained from the patient and patient chart.   Patient Reported Readings (BP, Pulse, CBG, Weight, etc) none  Pain Assessment Pain : No/denies pain     Current Medications & Allergies  (verified) Allergies as of 06/13/2019   No Known Allergies      Medication List        Accurate as of June 13, 2019 10:09 AM. If you have any questions, ask your nurse or doctor.          Acetaminophen Extra Strength 500 MG tablet Generic drug: acetaminophen TAKE 2 TABS BY MOUTH EVERY 4 HRS AS NEEDED FOR HEADACHE/MILD TO MODERATE PAIN OR TEMP OF 100.3 & ABOVE IF TEMP UNRESOLVED AFTER 24 HRS MUST   amLODipine 5 MG tablet Commonly known as: NORVASC Take 1 tablet (5 mg total) by mouth daily.   ARIPiprazole 2 MG tablet Commonly known as: ABILIFY Take 2 mg by mouth daily.   Banophen 25 mg capsule Generic drug: diphenhydrAMINE TAKE 1 CAPSULE BY MOUTH DAILY AS NEEDED FOR ALLERGY SYMPTOMS. CONTACTNURSE IF SYMPTOMS WORSEN.   Breo Ellipta 100-25 MCG/INH Aepb Generic drug: fluticasone furoate-vilanterol INHALE 1 PUFF ONCE DAILY.   cetirizine 10 MG tablet Commonly known as: ZYRTEC Take 1 tablet (10 mg total) by mouth daily.   Desitin Oint Apply 1 application topically 4 (four) times daily as needed.   levETIRAcetam 500 MG tablet Commonly known as: KEPPRA TAKE 1 TABLET BY MOUTH TWICE DAILY.   lisinopril 20 MG tablet Commonly known as: ZESTRIL TAKE (1) TABLET BY MOUTH ONCE DAILY.   nicotine 7 mg/24hr patch Commonly known as: NICODERM CQ - dosed in mg/24 hr PLACE 1 PATCH ONTO SKIN ONCE DAILY. START 7MG  ONCE 14MG  IS USED.   pantoprazole 40 MG tablet Commonly known as: PROTONIX TAKE (1) TABLET BY MOUTH ONCE DAILY.   PARoxetine 20 MG tablet Commonly known as: PAXIL TAKE  1 TABLET BY MOUTH AT BEDTIME.   triamcinolone cream 0.1 % Commonly known as: KENALOG Apply 1 application topically 2 (two) times daily.        History (reviewed): Past Medical History:  Diagnosis Date   Chronic kidney disease    Hypercholesteremia    Hypertension    Moderate intellectual disability    Mood disorder (HCC)    MR (mental retardation)    Obesity    Past Surgical History:   Procedure Laterality Date   CRANIOTOMY Left 07/07/2018   Procedure: CRANIOTOMY HEMATOMA EVACUATION SUBDURAL;  Surgeon: Tia Alert, MD;  Location: [facility] OR;  Service: Neurosurgery;  Laterality: Left;   CRANIOTOMY Left 07/13/2018   Procedure: Redo Left CRANIOTOMY FOR SUBDURAL HEMATOMA;  Surgeon: Tia Alert, MD;  Location: [facility] OR;  Service: Neurosurgery;  Laterality: Left;   Family History  Problem Relation Age of Onset   Stroke Sister    Social History   Socioeconomic History   Marital status: Single    Spouse name: Not on file   Number of children: Not on file   Years of education: Not on file   Highest education level: Not on file  Occupational History   Occupation: disabled  Tobacco Use   Smoking status: Former Smoker    Packs/day: 0.33    Types: Cigarettes    Quit date: 09/02/2018    Years since quitting: 0.7   Smokeless tobacco: Never Used  Substance and Sexual Activity   Alcohol use: No    Alcohol/week: 0.0 standard drinks   Drug use: No   Sexual activity: Never  Other Topics Concern   Not on file  Social History Narrative   Not on file   Social Determinants of Health   Financial Resource Strain:    Difficulty of Paying Living Expenses: Not on file  Food Insecurity:    Worried About Programme researcher, broadcasting/film/video in the Last Year: Not on file   [employer] of Food in the Last Year: Not on file  Transportation Needs:    Lack of Transportation (Medical): Not on file   Lack of Transportation (Non-Medical): Not on file  Physical Activity:    Days of Exercise per Week: Not on file   Minutes of Exercise per Session: Not on file  Stress:    Feeling of Stress : Not on file  Social Connections:    Frequency of Communication with Friends and Family: Not on file   Frequency of Social Gatherings with Friends and Family: Not on file   Attends Religious Services: Not on file   Active Member of Clubs or Organizations: Not on file   Attends Banker Meetings: Not on file    Marital Status: Not on file    Activities of Daily Living In your present state of health, do you have any difficulty performing the following activities: 06/13/2019 07/20/2018  Hearing? Y N  Comment little difficulty hearing -  Vision? Y N  Difficulty concentrating or making decisions? N Y  Walking or climbing stairs? N Y  Dressing or bathing? N Y  Doing errands, shopping? Michael Mercado  Comment has assistance with group home -  Preparing Food and eating ? Y -  Comment has food prepared for him -  Using the Toilet? N -  In the past six months, have you accidently leaked urine? N -  Do you have problems with loss of bowel control? N -  Managing your Medications? Y -  Comment group  home manages -  Managing your Finances? Y -  Comment group home manages -  Housekeeping or managing your Housekeeping? N -  Some recent data might be hidden    Patient Education/ Literacy How often do you need to have someone help you when you read instructions, pamphlets, or other written materials from your doctor or pharmacy?: 5 - Always(cant read) What is the last grade level you completed in school?: 1st  Exercise Current Exercise Habits: The patient does not participate in regular exercise at present, Exercise limited by: None identified  Diet Patient reports consuming 3 meals a day and 2 snack(s) a day Patient reports that his primary diet is: Regular Patient reports that she does have regular access to food.   Depression Screen PHQ 2/9 Scores 06/13/2019 05/17/2019 05/02/2019 12/22/2018 09/21/2018 09/07/2018 06/21/2018  PHQ - 2 Score 0 0 - 0 0 0 0  PHQ- 9 Score - - - - - - -  Exception Documentation - - Other- indicate reason in comment box - - - -  Not completed - - Patient is not able to answer they questions and group home states he has been acting different - - - -     Fall Risk Fall Risk  06/13/2019 05/17/2019 05/02/2019 12/22/2018 09/21/2018  Falls in the past year? 1 1 0 0 1  Number falls in past yr: 0 0  - - 0  Injury with Fall? 1 1 - - 1  Comment head trauma - - - Subdural hematoma.  Risk for fall due to : History of fall(s) - - - -  Follow up Falls prevention discussed - - - -     Objective:  Michael Mercado seemed alert and oriented and he participated appropriately during our telephone visit.  Blood Pressure Weight BMI  BP Readings from Last 3 Encounters:  05/17/19 137/65  05/02/19 125/69  03/24/19 (!) 146/76   Wt Readings from Last 3 Encounters:  05/17/19 133 lb 9.6 oz (60.6 kg)  05/02/19 138 lb 3.2 oz (62.7 kg)  03/24/19 143 lb (64.9 kg)   BMI Readings from Last 1 Encounters:  05/17/19 21.56 kg/m    *Unable to obtain current vital signs, weight, and BMI due to telephone visit type  Hearing/Vision  Michael Mercado did  seem to have difficulty with hearing/understanding during the telephone conversation Reports that he has had a formal eye exam by an eye care professional within the past year Reports that he has not had a formal hearing evaluation within the past year *Unable to fully assess hearing and vision during telephone visit type  Cognitive Function: 6CIT Screen 06/13/2019  What Year? 0 points  What month? 0 points   (Normal:0-7, Significant for Dysfunction: >8)  Normal Cognitive Function Screening: No:    Immunization & Health Maintenance Record Immunization History  Administered Date(s) Administered   Fluad Quad(high Dose 65+) 02/28/2019   Influenza, High Dose Seasonal PF 01/21/2017, 01/12/2018   Influenza,inj,Quad PF,6+ Mos 03/18/2016   Influenza-Unspecified 02/21/2014, 01/28/2015   Pneumococcal Conjugate-13 05/10/2018   Pneumococcal Polysaccharide-23 02/28/2019   Tdap 09/23/2016    Health Maintenance  Topic Date Due   TETANUS/TDAP  09/24/2026   INFLUENZA VACCINE  Completed   PNA vac Low Risk Adult  Completed       Assessment  This is a routine wellness examination for Michael Mercado.  Health Maintenance: Due or Overdue There are no  preventive care reminders to display for this patient.  Michael Mercado does not  need a referral for Community Assistance: Care Management:   no Social Work:    no Prescription Assistance:  no Nutrition/Diabetes Education:  no   Plan:  Personalized Goals Goals Addressed             This Visit's Progress    Patient Stated       06/13/2019 AWV Goal: Exercise for General Health  Patient will verbalize understanding of the benefits of increased physical activity: Exercising regularly is important. It will improve your overall fitness, flexibility, and endurance. Regular exercise also will improve your overall health. It can help you control your weight, reduce stress, and improve your bone density. Over the next year, patient will increase physical activity as tolerated with a goal of at least 150 minutes of moderate physical activity per week.  You can tell that you are exercising at a moderate intensity if your heart starts beating faster and you start breathing faster but can still hold a conversation. Moderate-intensity exercise ideas include: Walking 1 mile (1.6 km) in about 15 minutes Biking Hiking Golfing Dancing Water aerobics Patient will verbalize understanding of everyday activities that increase physical activity by providing examples like the following: Yard work, such as: Insurance underwriter Gardening Washing windows or floors Patient will be able to explain general safety guidelines for exercising:  Before you start a new exercise program, talk with your health care provider. Do not exercise so much that you hurt yourself, feel dizzy, or get very short of breath. Wear comfortable clothes and wear shoes with good support. Drink plenty of water while you exercise to prevent dehydration or heat stroke. Work out until your breathing and your heartbeat get faster.         Personalized Health Maintenance & Screening Recommendations    Lung Cancer Screening Recommended: not applicable (Low Dose CT Chest recommended if Age 65-80 years, 30 pack-year currently smoking OR have quit w/in past 15 years) Hepatitis C Screening recommended: no HIV Screening recommended: no  Advanced Directives: Written information was not prepared per patient's request.  Referrals & Orders No orders of the defined types were placed in this encounter.   Follow-up Plan Follow-up with Dettinger, Elige Radon, MD as planned   I have personally reviewed and noted the following in the patient's chart:   Medical and social history Use of alcohol, tobacco or illicit drugs  Current medications and supplements Functional ability and status Nutritional status Physical activity Advanced directives List of other physicians Hospitalizations, surgeries, and ER visits in previous 12 months Vitals Screenings to include cognitive, depression, and falls Referrals and appointments  In addition, I have reviewed and discussed with Michael Mercado certain preventive protocols, quality metrics, and best practice recommendations. A written personalized care plan for preventive services as well as general preventive health recommendations is available and can be mailed to the patient at his request.      Adella Hare, LPN  09/22/5407   I have reviewed and agree with the above AWV documentation.   Jannifer Rodney, FNP

## 2019-06-14 ENCOUNTER — Other Ambulatory Visit: Payer: Self-pay

## 2019-06-14 ENCOUNTER — Other Ambulatory Visit: Payer: Medicare Other

## 2019-06-14 DIAGNOSIS — E559 Vitamin D deficiency, unspecified: Secondary | ICD-10-CM | POA: Diagnosis not present

## 2019-06-14 DIAGNOSIS — E875 Hyperkalemia: Secondary | ICD-10-CM | POA: Diagnosis not present

## 2019-06-14 DIAGNOSIS — D638 Anemia in other chronic diseases classified elsewhere: Secondary | ICD-10-CM | POA: Diagnosis not present

## 2019-06-14 DIAGNOSIS — N1831 Chronic kidney disease, stage 3a: Secondary | ICD-10-CM | POA: Diagnosis not present

## 2019-06-14 DIAGNOSIS — D508 Other iron deficiency anemias: Secondary | ICD-10-CM | POA: Diagnosis not present

## 2019-06-21 ENCOUNTER — Telehealth: Payer: Self-pay | Admitting: Family Medicine

## 2019-06-21 ENCOUNTER — Encounter: Payer: Self-pay | Admitting: Family Medicine

## 2019-06-21 NOTE — Telephone Encounter (Signed)
Letter faxed.

## 2019-06-21 NOTE — Telephone Encounter (Signed)
Yes I am good with him going ahead and getting it, okay to do letter

## 2019-06-21 NOTE — Telephone Encounter (Signed)
Okay for letter

## 2019-06-26 DIAGNOSIS — Z23 Encounter for immunization: Secondary | ICD-10-CM | POA: Diagnosis not present

## 2019-07-19 DIAGNOSIS — I952 Hypotension due to drugs: Secondary | ICD-10-CM | POA: Diagnosis not present

## 2019-07-19 DIAGNOSIS — R296 Repeated falls: Secondary | ICD-10-CM | POA: Diagnosis not present

## 2019-07-19 DIAGNOSIS — F0391 Unspecified dementia with behavioral disturbance: Secondary | ICD-10-CM | POA: Diagnosis not present

## 2019-07-19 DIAGNOSIS — Z79899 Other long term (current) drug therapy: Secondary | ICD-10-CM | POA: Diagnosis not present

## 2019-07-25 DIAGNOSIS — Z23 Encounter for immunization: Secondary | ICD-10-CM | POA: Diagnosis not present

## 2019-07-25 DIAGNOSIS — F71 Moderate intellectual disabilities: Secondary | ICD-10-CM | POA: Diagnosis not present

## 2019-07-26 DIAGNOSIS — F71 Moderate intellectual disabilities: Secondary | ICD-10-CM | POA: Diagnosis not present

## 2019-07-29 DIAGNOSIS — F71 Moderate intellectual disabilities: Secondary | ICD-10-CM | POA: Diagnosis not present

## 2019-08-31 DIAGNOSIS — F319 Bipolar disorder, unspecified: Secondary | ICD-10-CM | POA: Diagnosis not present

## 2019-10-12 ENCOUNTER — Other Ambulatory Visit: Payer: Medicare Other

## 2019-10-12 ENCOUNTER — Other Ambulatory Visit: Payer: Self-pay

## 2019-11-05 IMAGING — CT CT HEAD WITHOUT CONTRAST
4 series · 16 of 47 positions shown, 18 images · non-contrast
Comparison: 07/08/2018

CLINICAL DATA: Followup subdural hematoma.

EXAM:
CT HEAD WITHOUT CONTRAST
TECHNIQUE: Contiguous axial images were obtained from the base of the skull
through the vertex without intravenous contrast.

[Series 3: head without · axial · non-contrast · 0.42mm/px · z∈[-102,+23]mm · 7 of 35 slices shown, 9 images]
[im 5/35  brain]
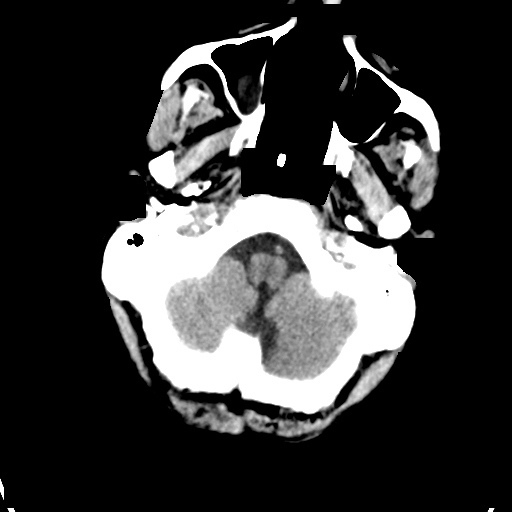
[im 5/35  bone]
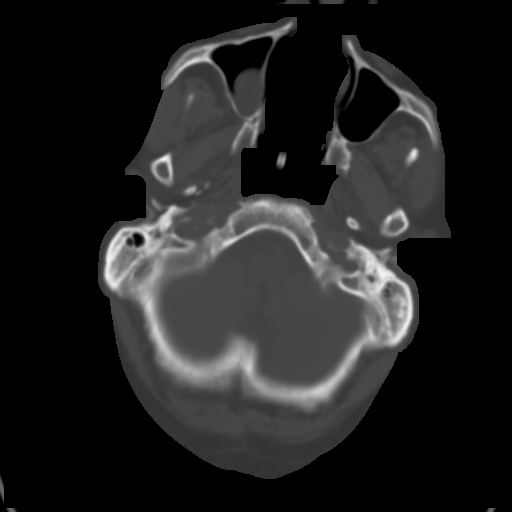
[im 9/35  brain]
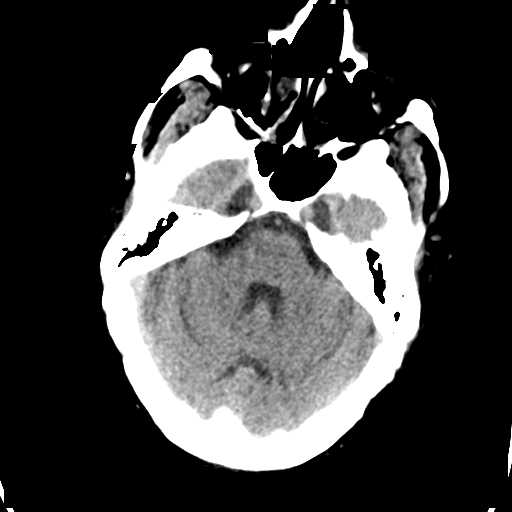
[im 13/35  brain]
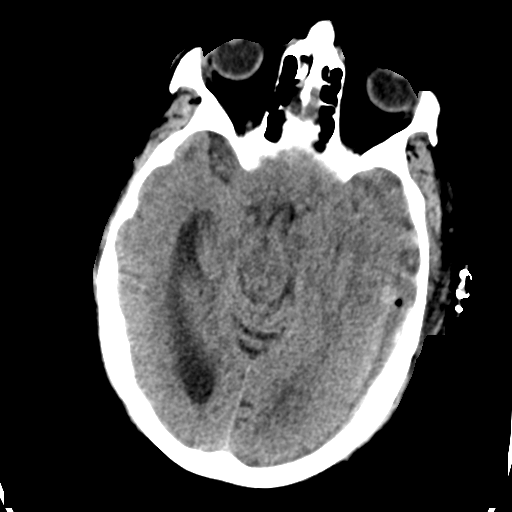
[im 18/35  brain]
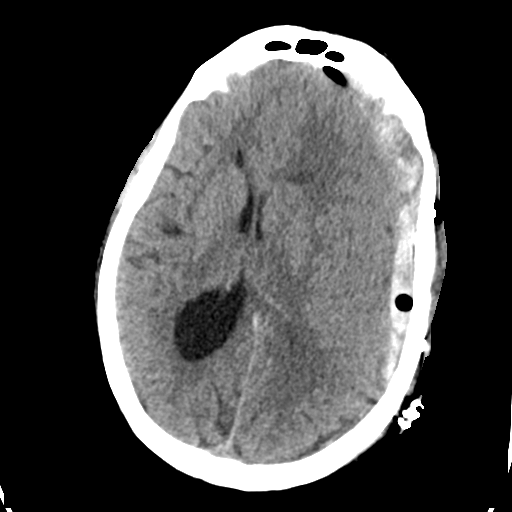
[im 22/35  brain]
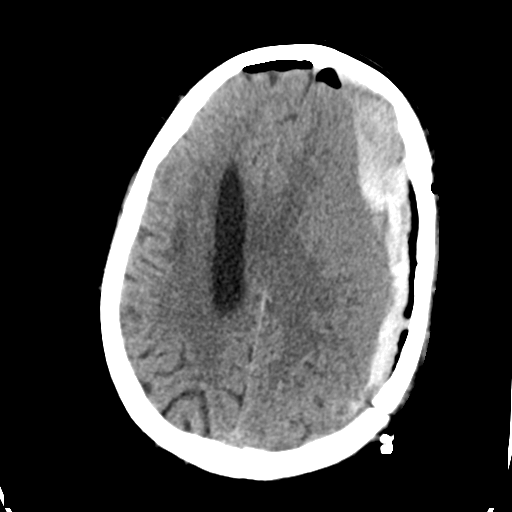
[im 22/35  bone]
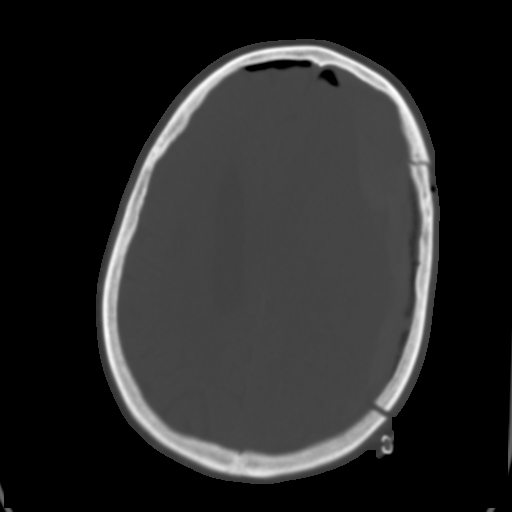
[im 26/35  brain]
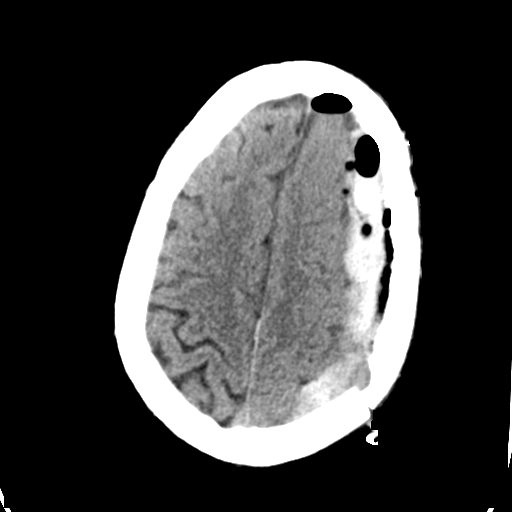
[im 30/35  brain]
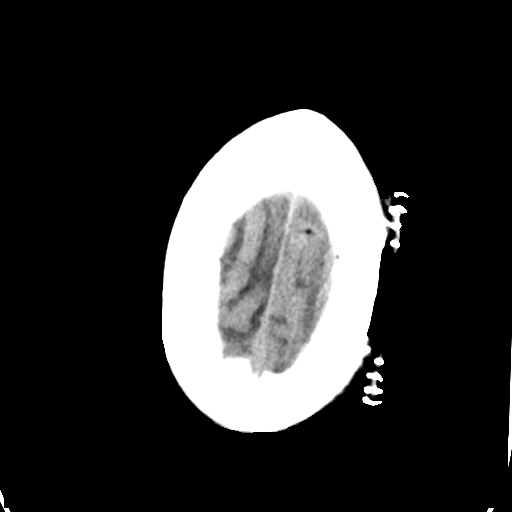

[Series 4: head bone · axial · 0.42mm/px · z∈[-106,-72]mm · 3 of 87 slices shown]
[im 9/87  bone]
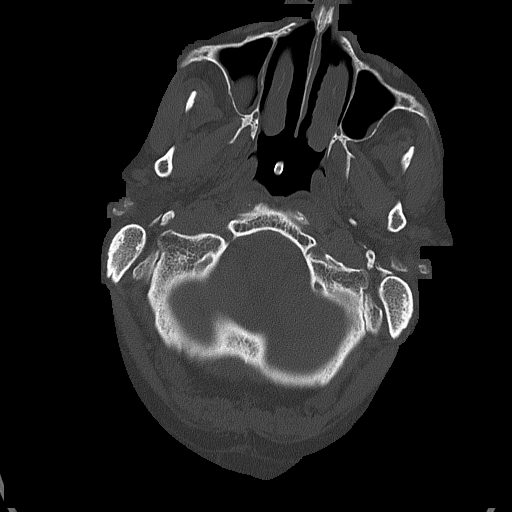
[im 18/87  bone]
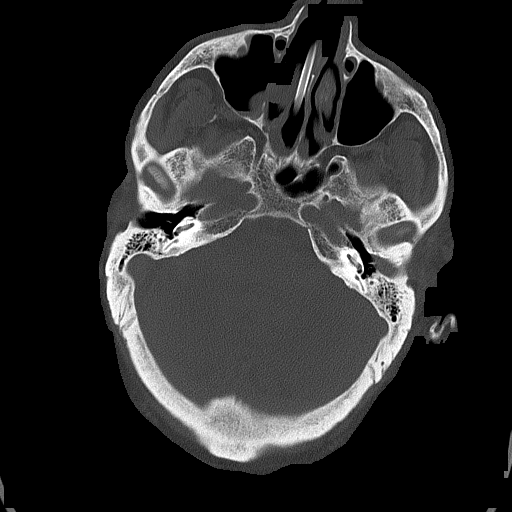
[im 26/87  bone]
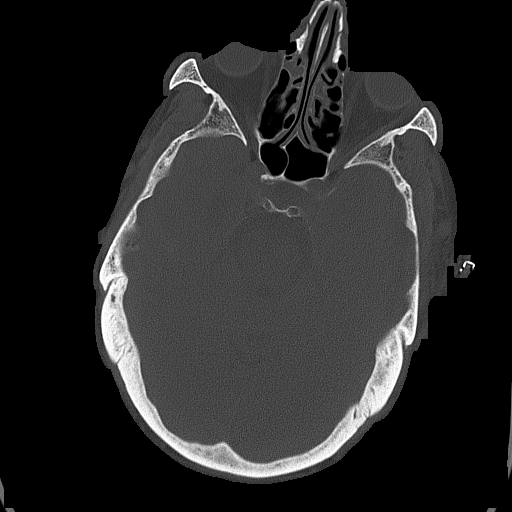

[Series 5: head without cor · coronal · non-contrast · 0.32mm/px · 3 of 71 slices shown]
[im 28/71  brain]
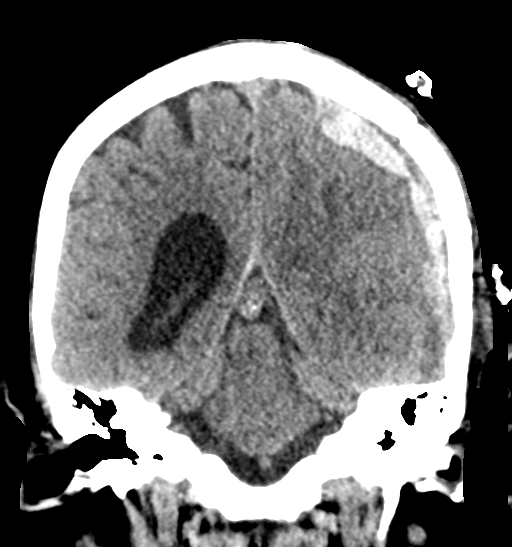
[im 33/71  brain]
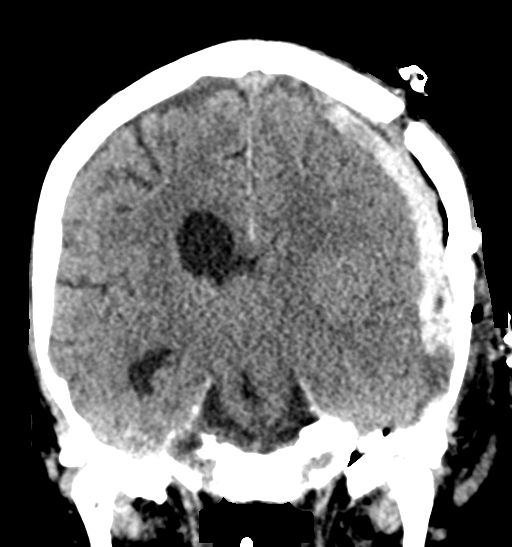
[im 38/71  brain]
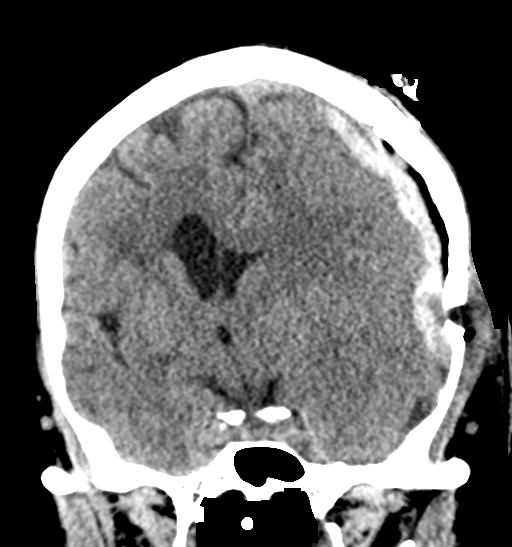

[Series 6: head without sag · sagittal · non-contrast · 0.34mm/px · 3 of 55 slices shown]
[im 19/55  brain]
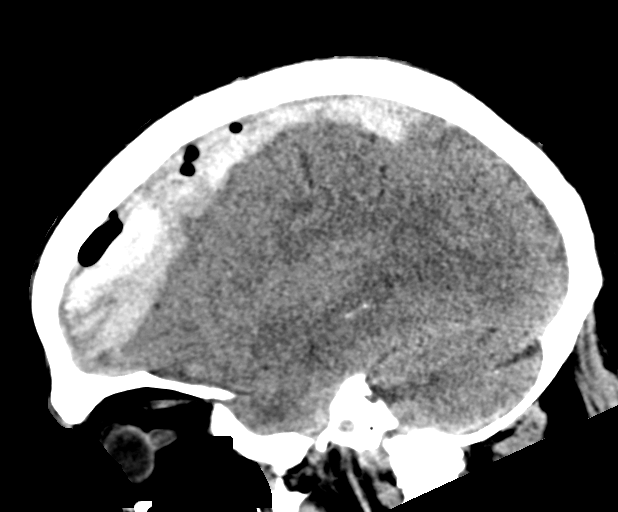
[im 28/55  brain]
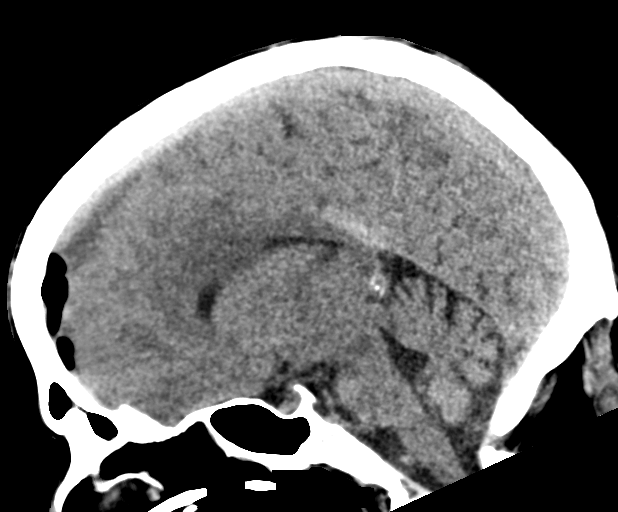
[im 37/55  brain]
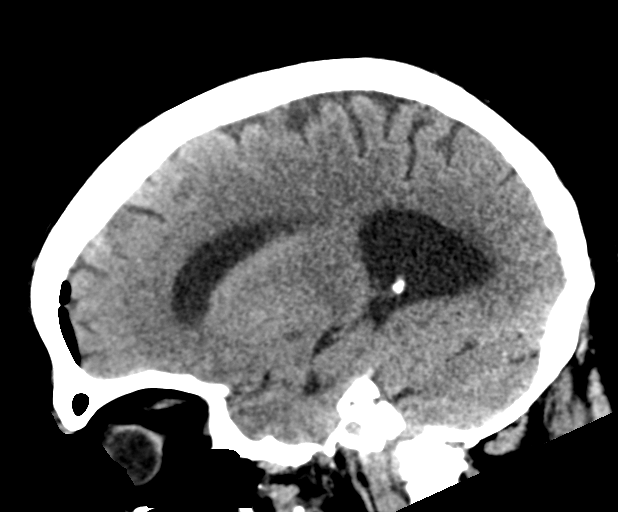

[16 of 47 positions shown; findings below may reference images not displayed]

FINDINGS: Brain: Left-sided subdural drain has been removed. The overall
volume blood and air in the left convexity subdural space is very
slightly diminished when measured in multiple locations. No evidence
of additional bleeding. Maximal thickness remains approximately
cm in the frontal region. Mass effect persists, with left-to-right
shift measuring up to 14 mm today, increased from maximal
measurement of 13 mm previously. There is flattening of the left
lateral ventricle. No definite trapping of the right lateral
ventricle. No evidence brain infarction. No new finding.

Vascular: There is atherosclerotic calcification of the major
vessels at the base of the brain.

Skull: Left frontoparietal craniotomy.

Sinuses/Orbits: Clear except for mild seasonal mucosal thickening
and a few retention cysts. Orbits negative.

Other: None
IMPRESSION: Left subdural drain has been removed. Persistent subdural blood and
air on the left, possibly very mildly diminished. No evidence of
worsening or new bleeding. Continued mass effect with left-to-right
shift of approximately 14 mm, perhaps a mm increased from the
previous study. Maximal thickness of the subdural collection in the
frontal region measures 2.4 cm.

## 2019-11-14 ENCOUNTER — Telehealth: Payer: Self-pay | Admitting: Family Medicine

## 2019-11-14 DIAGNOSIS — F0391 Unspecified dementia with behavioral disturbance: Secondary | ICD-10-CM | POA: Diagnosis not present

## 2019-11-14 DIAGNOSIS — I952 Hypotension due to drugs: Secondary | ICD-10-CM | POA: Diagnosis not present

## 2019-11-14 DIAGNOSIS — R2689 Other abnormalities of gait and mobility: Secondary | ICD-10-CM

## 2019-11-14 DIAGNOSIS — F79 Unspecified intellectual disabilities: Secondary | ICD-10-CM

## 2019-11-14 DIAGNOSIS — Z79899 Other long term (current) drug therapy: Secondary | ICD-10-CM | POA: Diagnosis not present

## 2019-11-14 DIAGNOSIS — R296 Repeated falls: Secondary | ICD-10-CM | POA: Diagnosis not present

## 2019-11-14 DIAGNOSIS — Z298 Encounter for other specified prophylactic measures: Secondary | ICD-10-CM

## 2019-11-16 DIAGNOSIS — F319 Bipolar disorder, unspecified: Secondary | ICD-10-CM | POA: Diagnosis not present

## 2019-11-17 ENCOUNTER — Other Ambulatory Visit: Payer: Self-pay | Admitting: Psychiatry

## 2019-11-17 ENCOUNTER — Other Ambulatory Visit: Payer: Self-pay

## 2019-11-17 ENCOUNTER — Other Ambulatory Visit (HOSPITAL_COMMUNITY): Payer: Self-pay | Admitting: Psychiatry

## 2019-11-17 ENCOUNTER — Other Ambulatory Visit: Payer: Medicare Other

## 2019-11-17 DIAGNOSIS — R413 Other amnesia: Secondary | ICD-10-CM

## 2019-11-17 DIAGNOSIS — Z79899 Other long term (current) drug therapy: Secondary | ICD-10-CM | POA: Diagnosis not present

## 2019-11-21 ENCOUNTER — Other Ambulatory Visit: Payer: Self-pay

## 2019-11-21 ENCOUNTER — Ambulatory Visit (HOSPITAL_COMMUNITY)
Admission: RE | Admit: 2019-11-21 | Discharge: 2019-11-21 | Disposition: A | Discharge status: Home / Self Care | Payer: Medicare Other | Source: Ambulatory Visit | Attending: Psychiatry | Admitting: Psychiatry

## 2019-11-21 DIAGNOSIS — F989 Unspecified behavioral and emotional disorders with onset usually occurring in childhood and adolescence: Secondary | ICD-10-CM | POA: Diagnosis not present

## 2019-11-21 DIAGNOSIS — R413 Other amnesia: Secondary | ICD-10-CM | POA: Insufficient documentation

## 2019-11-21 NOTE — Telephone Encounter (Signed)
Placed referral to new neurologist

## 2019-11-23 DIAGNOSIS — F319 Bipolar disorder, unspecified: Secondary | ICD-10-CM | POA: Diagnosis not present

## 2019-12-04 IMAGING — DX CHEST - 2 VIEW
2 series · 3 of 3 positions shown · non-contrast
Comparison: 07/14/2018

CLINICAL DATA: Altered mental status

EXAM:
CHEST - 2 VIEW

[Series 2: chest lat · 0.14mm/px · 2 of 2 slices shown]
[im 1/2]
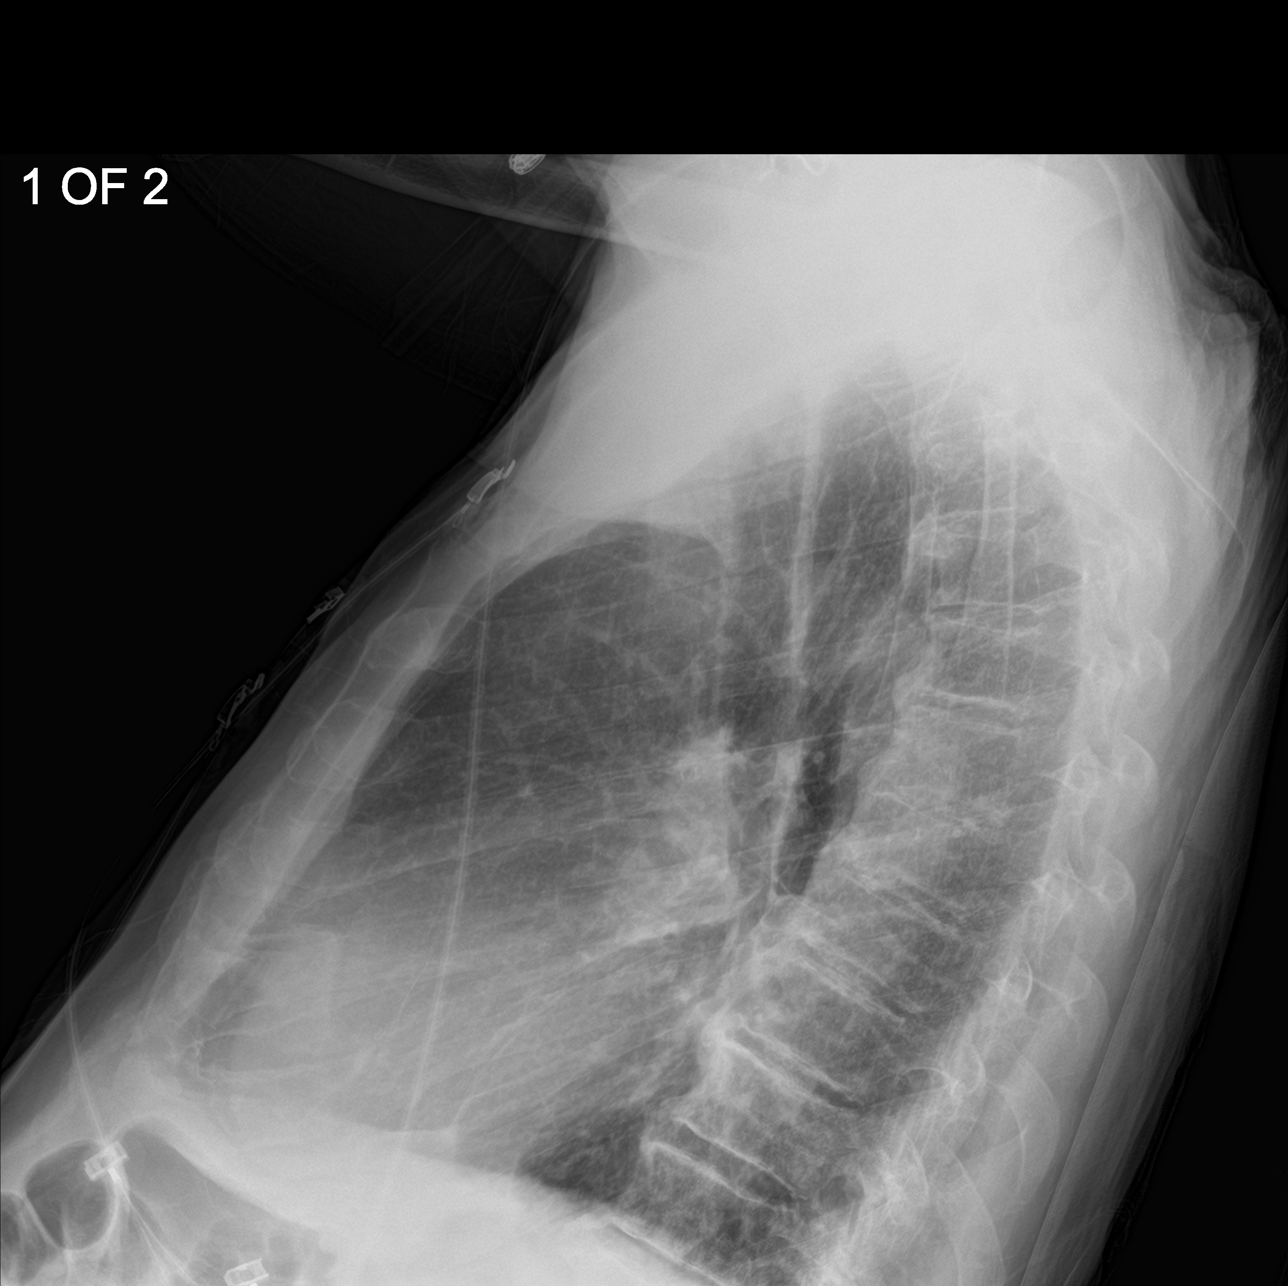
[im 2/2]
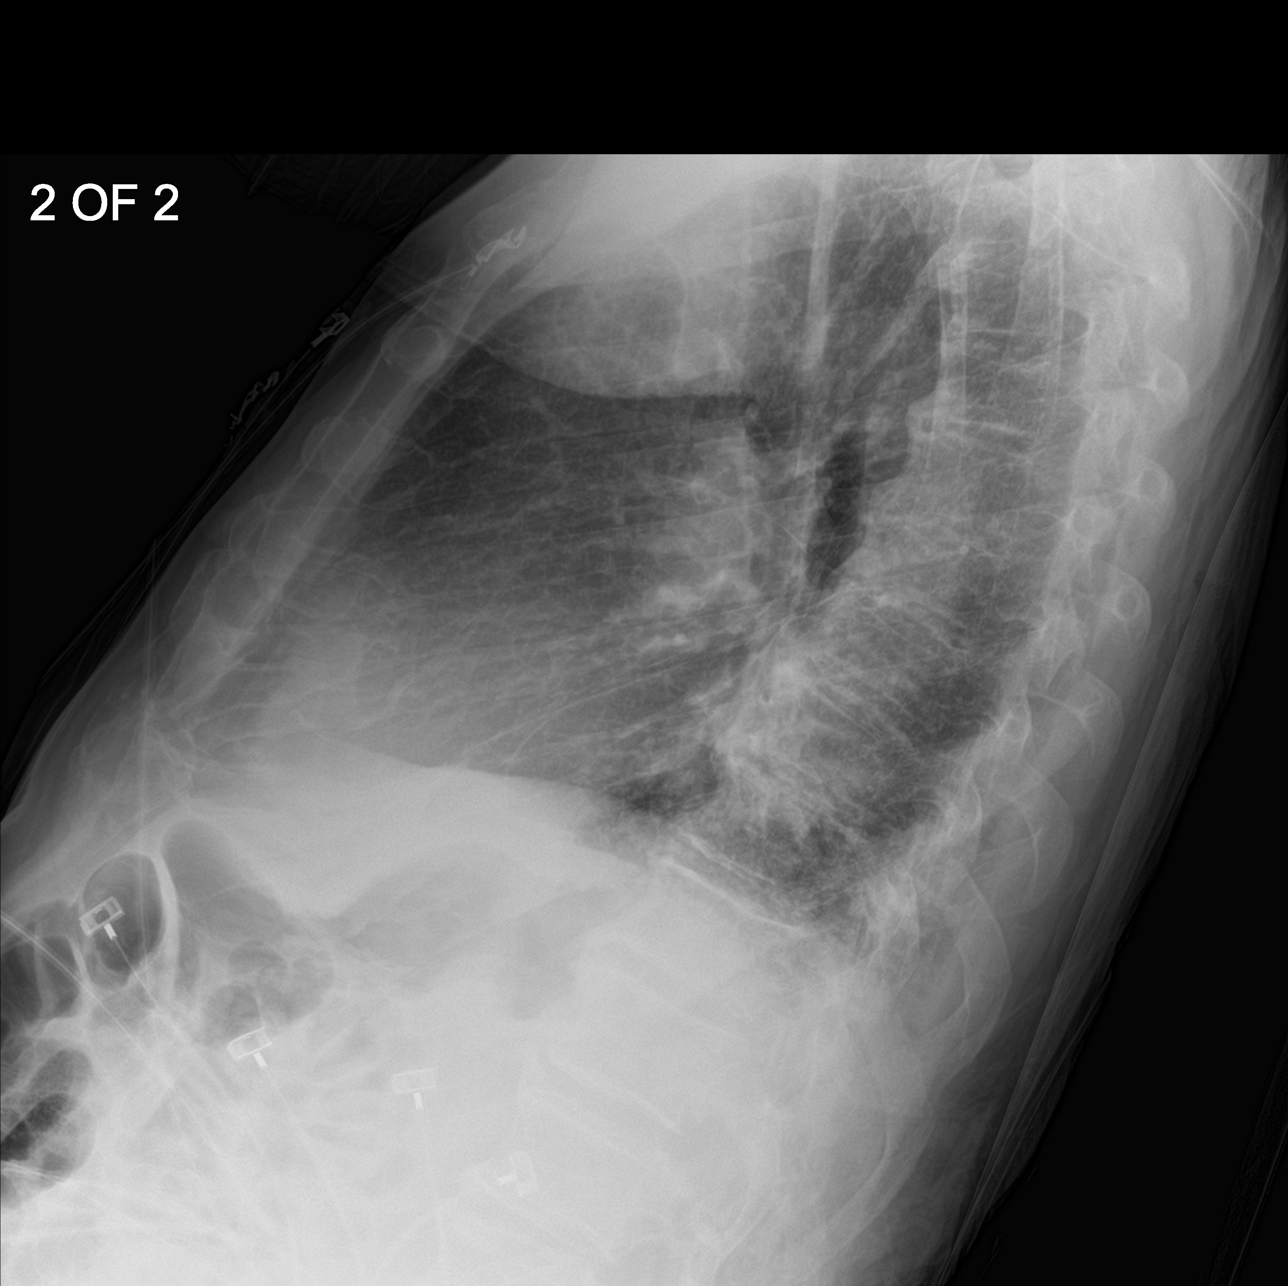

[chest ap]
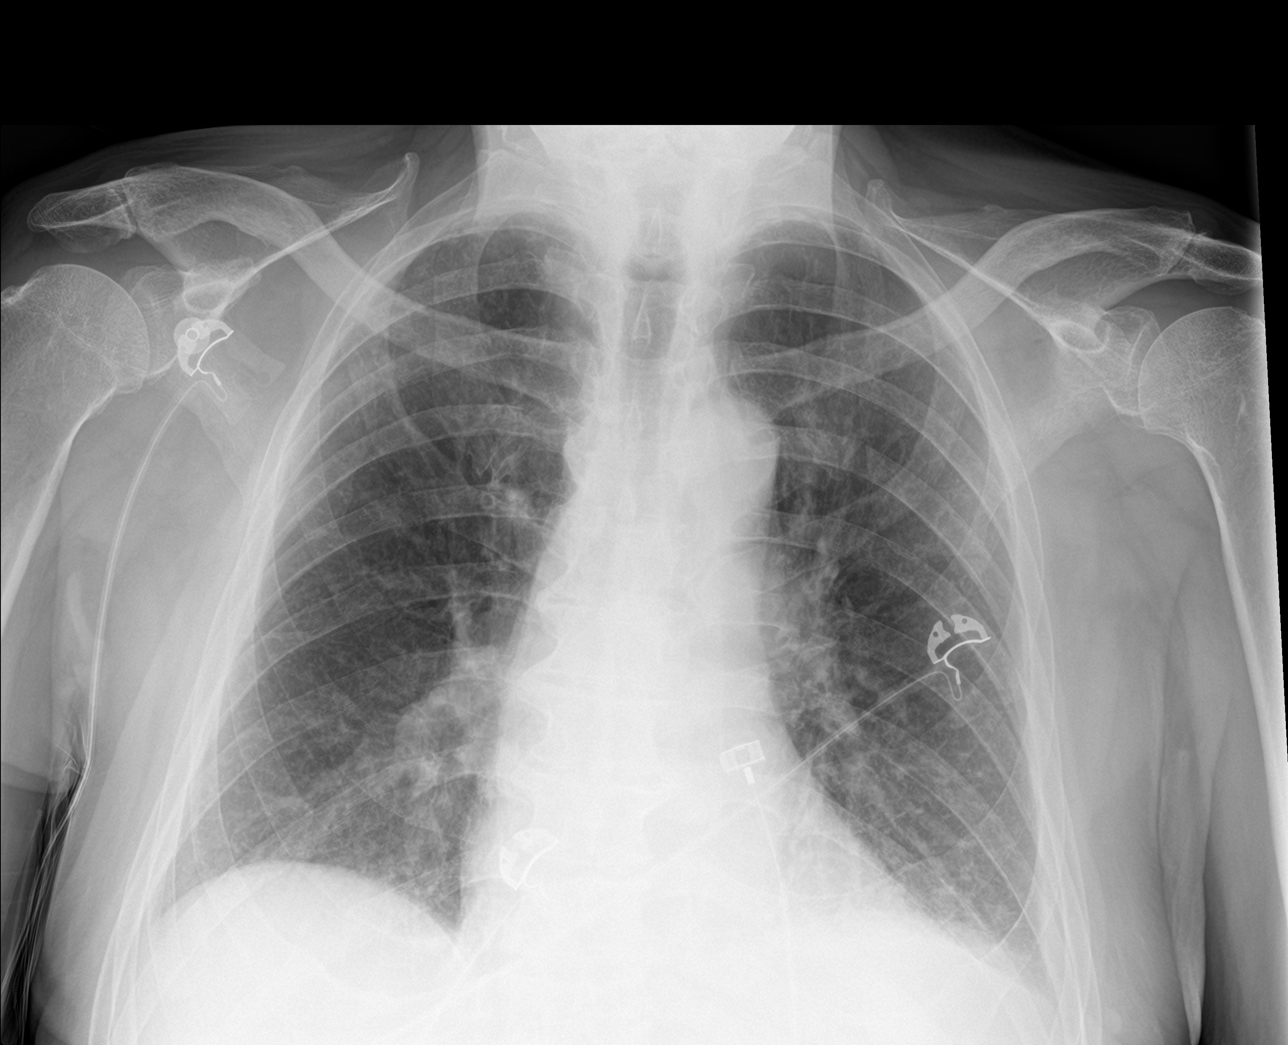

[3 of 3 positions shown; findings below may reference images not displayed]

FINDINGS: There is mild bilateral interstitial thickening. There is no focal
consolidation. There is no pleural effusion or pneumothorax. The
heart and mediastinal contours are unremarkable.

The osseous structures are unremarkable.
IMPRESSION: No active cardiopulmonary disease.

## 2019-12-13 ENCOUNTER — Encounter: Payer: Self-pay | Admitting: Family Medicine

## 2019-12-13 ENCOUNTER — Other Ambulatory Visit: Payer: Self-pay

## 2019-12-13 ENCOUNTER — Ambulatory Visit (INDEPENDENT_AMBULATORY_CARE_PROVIDER_SITE_OTHER): Payer: Medicare Other | Admitting: Family Medicine

## 2019-12-13 VITALS — BP 128/71 | HR 65 | Temp 97.1°F | Ht 66.0 in | Wt 140.0 lb

## 2019-12-13 DIAGNOSIS — I10 Essential (primary) hypertension: Secondary | ICD-10-CM | POA: Diagnosis not present

## 2019-12-13 DIAGNOSIS — N182 Chronic kidney disease, stage 2 (mild): Secondary | ICD-10-CM | POA: Diagnosis not present

## 2019-12-13 DIAGNOSIS — E785 Hyperlipidemia, unspecified: Secondary | ICD-10-CM

## 2019-12-13 DIAGNOSIS — J449 Chronic obstructive pulmonary disease, unspecified: Secondary | ICD-10-CM

## 2019-12-13 DIAGNOSIS — D649 Anemia, unspecified: Secondary | ICD-10-CM | POA: Diagnosis not present

## 2019-12-13 NOTE — Progress Notes (Signed)
BP 128/71    Pulse 65    Temp (!) 97.1 F (36.2 C)    Ht 5' 6"  (1.676 m)    Wt 140 lb (63.5 kg)    SpO2 98%    BMI 22.60 kg/m    Subjective:   Patient ID: Michael Mercado, male    DOB: 06-03-1940, 79 y.o.   MRN: [id number]  HPI: Michael Mercado is a 79 y.o. male presenting on 12/13/2019 for Medical Management of Chronic Issues, Hypertension, and Hyperlipidemia   HPI Hyperlipidemia Patient is coming in for recheck of his hyperlipidemia. The patient is currently taking no medication currently. They deny any issues with myalgias or history of liver damage from it. They deny any focal numbness or weakness or chest pain.   Hypertension Patient is currently on amlodipine and lisinopril, and their blood pressure today is 128/71. Patient denies any lightheadedness or dizziness. Patient denies headaches, blurred vision, chest pains, shortness of breath, or weakness. Denies any side effects from medication and is content with current medication.   COPD Patient is coming in for COPD recheck today.  He is currently on no medication currently.  He has a mild chronic cough but denies any major coughing spells or wheezing spells.  He has 0nighttime symptoms per week and 0daytime symptoms per week currently.   CKD stage III and anemia Patient is coming in for CKD stage III recheck an anemia.  He has been deemed anemic due to chronic disease and history of some blood loss.  He has been doing iron for quite some time and will recheck today.  Patient has been more agitated and anxious recently, he does see Dr. Tamera Punt for psychiatry.  He is currently on Paxil 20 and Abilify 2 mg.  Instructed to go discuss this with him because they have an appoint with him tomorrow.  Relevant past medical, surgical, family and social history reviewed and updated as indicated. Interim medical history since our last visit reviewed. Allergies and medications reviewed and updated.  Review of Systems    Constitutional: Negative for chills and fever.  Eyes: Negative for visual disturbance.  Respiratory: Negative for shortness of breath and wheezing.   Cardiovascular: Negative for chest pain and leg swelling.  Musculoskeletal: Negative for back pain and gait problem.  Skin: Negative for rash.  Neurological: Negative for dizziness, weakness and numbness.  All other systems reviewed and are negative.   Per HPI unless specifically indicated above   Allergies as of 12/13/2019   No Known Allergies     Medication List       Accurate as of December 13, 2019  2:25 PM. If you have any questions, ask your nurse or doctor.        Acetaminophen Extra Strength 500 MG tablet Generic drug: acetaminophen TAKE 2 TABS BY MOUTH EVERY 4 HRS AS NEEDED FOR HEADACHE/MILD TO MODERATE PAIN OR TEMP OF 100.3 & ABOVE IF TEMP UNRESOLVED AFTER 24 HRS MUST   amLODipine 5 MG tablet Commonly known as: NORVASC Take 1 tablet (5 mg total) by mouth daily.   ARIPiprazole 2 MG tablet Commonly known as: ABILIFY Take 2 mg by mouth daily.   Banophen 25 mg capsule Generic drug: diphenhydrAMINE TAKE 1 CAPSULE BY MOUTH DAILY AS NEEDED FOR ALLERGY SYMPTOMS. CONTACTNURSE IF SYMPTOMS WORSEN.   Breo Ellipta 100-25 MCG/INH Aepb Generic drug: fluticasone furoate-vilanterol INHALE 1 PUFF ONCE DAILY.   cetirizine 10 MG tablet Commonly known as: ZYRTEC Take 1 tablet (10 mg total)  by mouth daily.   Desitin Oint Apply 1 application topically 4 (four) times daily as needed.   levETIRAcetam 500 MG tablet Commonly known as: KEPPRA TAKE 1 TABLET BY MOUTH TWICE DAILY.   lisinopril 20 MG tablet Commonly known as: ZESTRIL TAKE (1) TABLET BY MOUTH ONCE DAILY.   nicotine 7 mg/24hr patch Commonly known as: NICODERM CQ - dosed in mg/24 hr PLACE 1 PATCH ONTO SKIN ONCE DAILY. START 7MG ONCE 14MG IS USED.   pantoprazole 40 MG tablet Commonly known as: PROTONIX TAKE (1) TABLET BY MOUTH ONCE DAILY.   PARoxetine 20 MG  tablet Commonly known as: PAXIL TAKE 1 TABLET BY MOUTH AT BEDTIME.   triamcinolone cream 0.1 % Commonly known as: KENALOG Apply 1 application topically 2 (two) times daily.        Objective:   BP 128/71    Pulse 65    Temp (!) 97.1 F (36.2 C)    Ht 5' 6"  (1.676 m)    Wt 140 lb (63.5 kg)    SpO2 98%    BMI 22.60 kg/m   Wt Readings from Last 3 Encounters:  12/13/19 140 lb (63.5 kg)  05/17/19 133 lb 9.6 oz (60.6 kg)  05/02/19 138 lb 3.2 oz (62.7 kg)    Physical Exam Vitals and nursing note reviewed.  Constitutional:      General: He is not in acute distress.    Appearance: He is well-developed. He is not diaphoretic.  Eyes:     General: No scleral icterus.    Conjunctiva/sclera: Conjunctivae normal.  Neck:     Thyroid: No thyromegaly.  Cardiovascular:     Rate and Rhythm: Normal rate and regular rhythm.     Heart sounds: Normal heart sounds. No murmur heard.   Pulmonary:     Effort: Pulmonary effort is normal. No respiratory distress.     Breath sounds: Normal breath sounds. No wheezing.  Musculoskeletal:        General: Normal range of motion.     Cervical back: Neck supple.  Lymphadenopathy:     Cervical: No cervical adenopathy.  Skin:    General: Skin is warm and dry.     Findings: No rash.  Neurological:     Mental Status: He is alert and oriented to person, place, and time.     Coordination: Coordination normal.  Psychiatric:        Behavior: Behavior normal.       Assessment & Plan:   Problem List Items Addressed This Visit      Cardiovascular and Mediastinum   HTN (hypertension), benign   Relevant Orders   CMP14+EGFR     Respiratory   COPD (chronic obstructive pulmonary disease) ([place])     Genitourinary   CKD (chronic kidney disease), stage II   Relevant Orders   CBC with Differential/Platelet   Iron, TIBC and Ferritin Panel     Other   Hyperlipidemia LDL goal <130 - Primary   Relevant Orders   Lipid panel      Continue current  medication, no changes. Follow up plan: Return if symptoms worsen or fail to improve, for Hypertension and COPD and CKD and hyperlipidemia.  Counseling provided for all of the vaccine components Orders Placed This Encounter  Procedures   CBC with Differential/Platelet   CMP14+EGFR   Lipid panel   Iron, TIBC and Ferritin Panel    Caryl Pina, MD [facility] 12/13/2019, 2:25 PM

## 2019-12-14 DIAGNOSIS — F319 Bipolar disorder, unspecified: Secondary | ICD-10-CM | POA: Diagnosis not present

## 2019-12-14 LAB — LIPID PANEL
Chol/HDL Ratio: 2.5 ratio (ref 0.0–5.0)
Cholesterol, Total: 125 mg/dL (ref 100–199)
HDL: 50 mg/dL (ref >39)
LDL Chol Calc (NIH): 63 mg/dL (ref 0–99)
Triglycerides: 55 mg/dL (ref 0–149)
VLDL Cholesterol Cal: 12 mg/dL (ref 5–40)

## 2019-12-14 LAB — CMP14+EGFR
ALT: 15 IU/L (ref 0–44)
AST: 20 IU/L (ref 0–40)
Albumin/Globulin Ratio: 2.1 (ref 1.2–2.2)
Albumin: 4.2 g/dL (ref 3.7–4.7)
Alkaline Phosphatase: 71 IU/L (ref 48–121)
BUN/Creatinine Ratio: 17 (ref 10–24)
BUN: 18 mg/dL (ref 8–27)
Bilirubin Total: <0.2 mg/dL (ref 0.0–1.2)
CO2: 27 mmol/L (ref 20–29)
Calcium: 9 mg/dL (ref 8.6–10.2)
Chloride: 101 mmol/L (ref 96–106)
Creatinine, Ser: 1.05 mg/dL (ref 0.76–1.27)
GFR calc Af Amer: 79 mL/min/{1.73_m2} (ref >59)
GFR calc non Af Amer: 68 mL/min/{1.73_m2} (ref >59)
Globulin, Total: 2 g/dL (ref 1.5–4.5)
Glucose: 98 mg/dL (ref 65–99)
Potassium: 4.8 mmol/L (ref 3.5–5.2)
Sodium: 139 mmol/L (ref 134–144)
Total Protein: 6.2 g/dL (ref 6.0–8.5)

## 2019-12-14 LAB — CBC WITH DIFFERENTIAL/PLATELET
Basophils Absolute: 0.1 10*3/uL (ref 0.0–0.2)
Basos: 1 %
EOS (ABSOLUTE): 0.3 10*3/uL (ref 0.0–0.4)
Eos: 4 %
Hematocrit: 37.5 % (ref 37.5–51.0)
Hemoglobin: 12.8 g/dL — ABNORMAL LOW (ref 13.0–17.7)
Immature Grans (Abs): 0 10*3/uL (ref 0.0–0.1)
Immature Granulocytes: 0 %
Lymphocytes Absolute: 1.9 10*3/uL (ref 0.7–3.1)
Lymphs: 27 %
MCH: 31.5 pg (ref 26.6–33.0)
MCHC: 34.1 g/dL (ref 31.5–35.7)
MCV: 92 fL (ref 79–97)
Monocytes Absolute: 0.5 10*3/uL (ref 0.1–0.9)
Monocytes: 7 %
Neutrophils Absolute: 4.2 10*3/uL (ref 1.4–7.0)
Neutrophils: 61 %
Platelets: 180 10*3/uL (ref 150–450)
RBC: 4.06 x10E6/uL — ABNORMAL LOW (ref 4.14–5.80)
RDW: 13.7 % (ref 11.6–15.4)
WBC: 6.9 10*3/uL (ref 3.4–10.8)

## 2019-12-14 LAB — IRON,TIBC AND FERRITIN PANEL
Ferritin: 89 ng/mL (ref 30–400)
Iron Saturation: 16 % (ref 15–55)
Iron: 45 ug/dL (ref 38–169)
Total Iron Binding Capacity: 274 ug/dL (ref 250–450)
UIBC: 229 ug/dL (ref 111–343)

## 2020-01-19 ENCOUNTER — Other Ambulatory Visit: Payer: Self-pay | Admitting: Family Medicine

## 2020-01-23 ENCOUNTER — Other Ambulatory Visit: Payer: Self-pay | Admitting: Family Medicine

## 2020-01-23 DIAGNOSIS — Z23 Encounter for immunization: Secondary | ICD-10-CM | POA: Diagnosis not present

## 2020-01-24 ENCOUNTER — Other Ambulatory Visit: Payer: Self-pay | Admitting: Family Medicine

## 2020-01-24 ENCOUNTER — Ambulatory Visit: Payer: Medicare Other

## 2020-01-24 DIAGNOSIS — L209 Atopic dermatitis, unspecified: Secondary | ICD-10-CM

## 2020-01-30 DIAGNOSIS — F319 Bipolar disorder, unspecified: Secondary | ICD-10-CM | POA: Diagnosis not present

## 2020-02-08 DIAGNOSIS — Z79899 Other long term (current) drug therapy: Secondary | ICD-10-CM | POA: Diagnosis not present

## 2020-02-13 DIAGNOSIS — F319 Bipolar disorder, unspecified: Secondary | ICD-10-CM | POA: Diagnosis not present

## 2020-02-19 ENCOUNTER — Other Ambulatory Visit: Payer: Self-pay | Admitting: Family Medicine

## 2020-02-19 DIAGNOSIS — F424 Excoriation (skin-picking) disorder: Secondary | ICD-10-CM

## 2020-02-19 DIAGNOSIS — L209 Atopic dermatitis, unspecified: Secondary | ICD-10-CM

## 2020-03-05 DIAGNOSIS — Z23 Encounter for immunization: Secondary | ICD-10-CM | POA: Diagnosis not present

## 2020-04-28 DIAGNOSIS — Z20822 Contact with and (suspected) exposure to covid-19: Secondary | ICD-10-CM | POA: Diagnosis not present

## 2020-04-28 DIAGNOSIS — U071 COVID-19: Secondary | ICD-10-CM | POA: Diagnosis not present

## 2020-05-15 DIAGNOSIS — F319 Bipolar disorder, unspecified: Secondary | ICD-10-CM | POA: Diagnosis not present

## 2020-05-16 ENCOUNTER — Other Ambulatory Visit: Payer: Self-pay | Admitting: Family Medicine

## 2020-05-16 DIAGNOSIS — I10 Essential (primary) hypertension: Secondary | ICD-10-CM

## 2020-05-23 DIAGNOSIS — N1831 Chronic kidney disease, stage 3a: Secondary | ICD-10-CM | POA: Diagnosis not present

## 2020-05-23 DIAGNOSIS — E875 Hyperkalemia: Secondary | ICD-10-CM | POA: Diagnosis not present

## 2020-05-23 DIAGNOSIS — E559 Vitamin D deficiency, unspecified: Secondary | ICD-10-CM | POA: Diagnosis not present

## 2020-05-23 DIAGNOSIS — D508 Other iron deficiency anemias: Secondary | ICD-10-CM | POA: Diagnosis not present

## 2020-06-06 DIAGNOSIS — D508 Other iron deficiency anemias: Secondary | ICD-10-CM | POA: Diagnosis not present

## 2020-06-06 DIAGNOSIS — E559 Vitamin D deficiency, unspecified: Secondary | ICD-10-CM | POA: Diagnosis not present

## 2020-06-06 DIAGNOSIS — N1831 Chronic kidney disease, stage 3a: Secondary | ICD-10-CM | POA: Diagnosis not present

## 2020-06-06 DIAGNOSIS — I129 Hypertensive chronic kidney disease with stage 1 through stage 4 chronic kidney disease, or unspecified chronic kidney disease: Secondary | ICD-10-CM | POA: Diagnosis not present

## 2020-06-06 DIAGNOSIS — D696 Thrombocytopenia, unspecified: Secondary | ICD-10-CM | POA: Diagnosis not present

## 2020-06-06 DIAGNOSIS — E875 Hyperkalemia: Secondary | ICD-10-CM | POA: Diagnosis not present

## 2020-06-10 ENCOUNTER — Ambulatory Visit: Payer: Medicare Other | Admitting: Family Medicine

## 2020-06-12 DIAGNOSIS — F319 Bipolar disorder, unspecified: Secondary | ICD-10-CM | POA: Diagnosis not present

## 2020-06-13 ENCOUNTER — Encounter: Payer: Self-pay | Admitting: Family Medicine

## 2020-06-13 ENCOUNTER — Other Ambulatory Visit: Payer: Self-pay

## 2020-06-13 ENCOUNTER — Ambulatory Visit (INDEPENDENT_AMBULATORY_CARE_PROVIDER_SITE_OTHER): Payer: Medicare Other | Admitting: Family Medicine

## 2020-06-13 VITALS — BP 149/75 | HR 53 | Ht 66.0 in | Wt 139.0 lb

## 2020-06-13 DIAGNOSIS — I10 Essential (primary) hypertension: Secondary | ICD-10-CM | POA: Diagnosis not present

## 2020-06-13 DIAGNOSIS — J449 Chronic obstructive pulmonary disease, unspecified: Secondary | ICD-10-CM

## 2020-06-13 DIAGNOSIS — N182 Chronic kidney disease, stage 2 (mild): Secondary | ICD-10-CM | POA: Diagnosis not present

## 2020-06-13 DIAGNOSIS — E785 Hyperlipidemia, unspecified: Secondary | ICD-10-CM

## 2020-06-13 DIAGNOSIS — J439 Emphysema, unspecified: Secondary | ICD-10-CM

## 2020-06-13 LAB — CBC WITH DIFFERENTIAL/PLATELET
Basophils Absolute: 0 10*3/uL (ref 0.0–0.2)
Basos: 1 %
EOS (ABSOLUTE): 0.2 10*3/uL (ref 0.0–0.4)
Eos: 3 %
Hematocrit: 41.7 % (ref 37.5–51.0)
Hemoglobin: 14 g/dL (ref 13.0–17.7)
Immature Grans (Abs): 0 10*3/uL (ref 0.0–0.1)
Immature Granulocytes: 0 %
Lymphocytes Absolute: 1.4 10*3/uL (ref 0.7–3.1)
Lymphs: 27 %
MCH: 31.3 pg (ref 26.6–33.0)
MCHC: 33.6 g/dL (ref 31.5–35.7)
MCV: 93 fL (ref 79–97)
Monocytes Absolute: 0.4 10*3/uL (ref 0.1–0.9)
Monocytes: 8 %
Neutrophils Absolute: 3.2 10*3/uL (ref 1.4–7.0)
Neutrophils: 61 %
Platelets: 146 10*3/uL — ABNORMAL LOW (ref 150–450)
RBC: 4.48 x10E6/uL (ref 4.14–5.80)
RDW: 13.2 % (ref 11.6–15.4)
WBC: 5.3 10*3/uL (ref 3.4–10.8)

## 2020-06-13 LAB — LIPID PANEL
Chol/HDL Ratio: 2.8 ratio (ref 0.0–5.0)
Cholesterol, Total: 139 mg/dL (ref 100–199)
HDL: 49 mg/dL
LDL Chol Calc (NIH): 77 mg/dL (ref 0–99)
Triglycerides: 66 mg/dL (ref 0–149)
VLDL Cholesterol Cal: 13 mg/dL (ref 5–40)

## 2020-06-13 LAB — CMP14+EGFR
ALT: 11 IU/L (ref 0–44)
AST: 18 IU/L (ref 0–40)
Albumin/Globulin Ratio: 2.1 (ref 1.2–2.2)
Albumin: 4.2 g/dL (ref 3.7–4.7)
Alkaline Phosphatase: 76 IU/L (ref 44–121)
BUN/Creatinine Ratio: 18 (ref 10–24)
BUN: 22 mg/dL (ref 8–27)
Bilirubin Total: 0.3 mg/dL (ref 0.0–1.2)
CO2: 26 mmol/L (ref 20–29)
Calcium: 9.1 mg/dL (ref 8.6–10.2)
Chloride: 106 mmol/L (ref 96–106)
Creatinine, Ser: 1.19 mg/dL (ref 0.76–1.27)
GFR calc Af Amer: 67 mL/min/1.73
GFR calc non Af Amer: 58 mL/min/1.73 — ABNORMAL LOW
Globulin, Total: 2 g/dL (ref 1.5–4.5)
Glucose: 74 mg/dL (ref 65–99)
Potassium: 4.4 mmol/L (ref 3.5–5.2)
Sodium: 145 mmol/L — ABNORMAL HIGH (ref 134–144)
Total Protein: 6.2 g/dL (ref 6.0–8.5)

## 2020-06-13 MED ORDER — BREO ELLIPTA 100-25 MCG/INH IN AEPB: 1.0000 | INHALATION_SPRAY | Freq: Every day | RESPIRATORY_TRACT | 3 refills | Status: DC

## 2020-06-13 MED ORDER — PAROXETINE HCL 20 MG PO TABS: 20.0000 mg | ORAL_TABLET | Freq: Every day | ORAL | 3 refills | Status: DC

## 2020-06-13 MED ORDER — AMLODIPINE BESYLATE 5 MG PO TABS: ORAL_TABLET | ORAL | 3 refills | Status: DC

## 2020-06-13 MED ORDER — PANTOPRAZOLE SODIUM 40 MG PO TBEC: 40.0000 mg | DELAYED_RELEASE_TABLET | Freq: Every day | ORAL | 3 refills | Status: DC

## 2020-06-13 MED ORDER — LISINOPRIL 20 MG PO TABS: ORAL_TABLET | ORAL | 3 refills | Status: DC

## 2020-06-13 MED ORDER — LEVETIRACETAM 500 MG PO TABS: 500.0000 mg | ORAL_TABLET | Freq: Two times a day (BID) | ORAL | 3 refills | Status: DC

## 2020-06-13 NOTE — Progress Notes (Signed)
BP (!) 149/75   Pulse (!) 53   Ht 5' 6"  (1.676 m)   Wt 139 lb (63 kg)   SpO2 100%   BMI 22.44 kg/m    Subjective:   Patient ID: Michael Mercado, male    DOB: Jan 13, 1941, 80 y.o.   MRN: [id number]  HPI: Michael Mercado is a 80 y.o. male presenting on 06/13/2020 for Medical Management of Chronic Issues, Hypertension, and Hyperlipidemia   HPI Hypertension Patient is currently on amlodipine and lisinopril, and their blood pressure today is 149/75. Patient denies any lightheadedness or dizziness. Patient denies headaches, blurred vision, chest pains, shortness of breath, or weakness. Denies any side effects from medication and is content with current medication.   Hyperlipidemia Patient is coming in for recheck of his hyperlipidemia. The patient is currently taking no medication currently has been diet controlled, will follow up with [place]. They deny any issues with myalgias or history of liver damage from it. They deny any focal numbness or weakness or chest pain.   COPD Patient is coming in for COPD recheck today.  He is currently on Breo.  He has a mild chronic cough but denies any major coughing spells or wheezing spells.  He has 0nighttime symptoms per week and 0daytime symptoms per week currently.   Recheck on CKD. Patient is urinating well denies any issues, will check blood work today again.  Relevant past medical, surgical, family and social history reviewed and updated as indicated. Interim medical history since our last visit reviewed. Allergies and medications reviewed and updated.  Review of Systems  Constitutional: Negative for chills and fever.  Eyes: Negative for discharge.  Respiratory: Positive for cough. Negative for shortness of breath and wheezing.   Cardiovascular: Negative for chest pain and leg swelling.  Musculoskeletal: Negative for back pain and gait problem.  Skin: Negative for rash.  Neurological: Negative for dizziness, weakness and numbness.   All other systems reviewed and are negative.   Per HPI unless specifically indicated above   Allergies as of 06/13/2020   No Known Allergies     Medication List       Accurate as of June 13, 2020 10:15 AM. If you have any questions, ask your nurse or doctor.        Acetaminophen Extra Strength 500 MG tablet Generic drug: acetaminophen TAKE 2 TABS BY MOUTH EVERY 4 HRS AS NEEDED FOR HEADACHE/MILD TO MODERATE PAIN OR TEMP OF 100.3 & ABOVE IF TEMP UNRESOLVED AFTER 24 HRS MUST CALL NURSE TO FOLLOW UP W/ MD.   amLODipine 5 MG tablet Commonly known as: NORVASC TAKE (1) TABLET BY MOUTH ONCE DAILY.   ARIPiprazole 2 MG tablet Commonly known as: ABILIFY Take 2 mg by mouth daily.   Banophen 25 mg capsule Generic drug: diphenhydrAMINE TAKE 1 CAPSULE BY MOUTH DAILY AS NEEDED FOR ALLERGY SYMPTOMS. CONTACT NURSE IF SYMPTOMS WORSEN.   Breo Ellipta 100-25 MCG/INH Aepb Generic drug: fluticasone furoate-vilanterol INHALE 1 PUFF ONCE DAILY.   cetirizine 10 MG tablet Commonly known as: ZYRTEC TAKE (1) TABLET BY MOUTH ONCE DAILY.   Desitin Oint Apply 1 application topically 4 (four) times daily as needed.   levETIRAcetam 500 MG tablet Commonly known as: KEPPRA TAKE 1 TABLET BY MOUTH TWICE DAILY.   lisinopril 20 MG tablet Commonly known as: ZESTRIL TAKE (1) TABLET BY MOUTH ONCE DAILY.   nicotine 7 mg/24hr patch Commonly known as: NICODERM CQ - dosed in mg/24 hr PLACE 1 PATCH ONTO SKIN ONCE DAILY.  START 7MG ONCE 14MG IS USED.   pantoprazole 40 MG tablet Commonly known as: PROTONIX TAKE (1) TABLET BY MOUTH ONCE DAILY.   PARoxetine 20 MG tablet Commonly known as: PAXIL TAKE 1 TABLET BY MOUTH AT BEDTIME.   triamcinolone 0.1 % Commonly known as: KENALOG APPLY TOPICALLY TO AFFECTED AREA(S) TWICE DAILY.        Objective:   BP (!) 149/75   Pulse (!) 53   Ht 5' 6"  (1.676 m)   Wt 139 lb (63 kg)   SpO2 100%   BMI 22.44 kg/m   Wt Readings from Last 3 Encounters:   06/13/20 139 lb (63 kg)  12/13/19 140 lb (63.5 kg)  05/17/19 133 lb 9.6 oz (60.6 kg)    Physical Exam Vitals and nursing note reviewed.  Constitutional:      General: He is not in acute distress.    Appearance: He is well-developed and well-nourished. He is not diaphoretic.  Eyes:     General: No scleral icterus.    Extraocular Movements: EOM normal.     Conjunctiva/sclera: Conjunctivae normal.  Neck:     Thyroid: No thyromegaly.  Cardiovascular:     Rate and Rhythm: Normal rate and regular rhythm.     Pulses: Intact distal pulses.     Heart sounds: Normal heart sounds. No murmur heard.   Pulmonary:     Effort: Pulmonary effort is normal. No respiratory distress.     Breath sounds: Normal breath sounds. No wheezing.  Musculoskeletal:        General: No edema. Normal range of motion.     Cervical back: Neck supple.  Lymphadenopathy:     Cervical: No cervical adenopathy.  Skin:    General: Skin is warm and dry.     Findings: No rash.  Neurological:     Mental Status: He is alert and oriented to person, place, and time.     Coordination: Coordination normal.  Psychiatric:        Mood and Affect: Mood and affect normal.        Behavior: Behavior normal.       Assessment & Plan:   Problem List Items Addressed This Visit      Cardiovascular and Mediastinum   HTN (hypertension), benign   Relevant Medications   amLODipine (NORVASC) 5 MG tablet   lisinopril (ZESTRIL) 20 MG tablet   Other Relevant Orders   CBC with Differential/Platelet   CMP14+EGFR     Respiratory   COPD (chronic obstructive pulmonary disease) (HCC)   Relevant Medications   fluticasone furoate-vilanterol (BREO ELLIPTA) 100-25 MCG/INH AEPB     Genitourinary   CKD (chronic kidney disease), stage II   Relevant Orders   CBC with Differential/Platelet   CMP14+EGFR     Other   Hyperlipidemia LDL goal <130 - Primary   Relevant Medications   amLODipine (NORVASC) 5 MG tablet   lisinopril  (ZESTRIL) 20 MG tablet   Other Relevant Orders   Lipid panel      No change in medication, continue current treatment Follow up plan: Return in about 6 months (around 12/11/2020), or if symptoms worsen or fail to improve, for Hypertension and cholesterol and physical.  Counseling provided for all of the vaccine components No orders of the defined types were placed in this encounter.   Caryl Pina, MD [facility] 06/13/2020, 10:15 AM

## 2020-06-18 ENCOUNTER — Telehealth: Payer: Self-pay

## 2020-06-18 NOTE — Telephone Encounter (Signed)
Know if Dr. Gerilyn Pilgrim discontinued and then go ahead and keep it off, I was not notified of that so go ahead and discontinue it.

## 2020-06-18 NOTE — Telephone Encounter (Signed)
Pharmacy aware and verbalized understanding.

## 2020-07-03 DIAGNOSIS — F0391 Unspecified dementia with behavioral disturbance: Secondary | ICD-10-CM | POA: Diagnosis not present

## 2020-07-03 DIAGNOSIS — Z79899 Other long term (current) drug therapy: Secondary | ICD-10-CM | POA: Diagnosis not present

## 2020-07-03 DIAGNOSIS — I952 Hypotension due to drugs: Secondary | ICD-10-CM | POA: Diagnosis not present

## 2020-07-03 DIAGNOSIS — R296 Repeated falls: Secondary | ICD-10-CM | POA: Diagnosis not present

## 2020-07-08 ENCOUNTER — Encounter: Payer: Self-pay | Admitting: *Deleted

## 2020-07-11 ENCOUNTER — Ambulatory Visit: Payer: Medicare Other | Admitting: Family Medicine

## 2020-07-18 ENCOUNTER — Other Ambulatory Visit: Payer: Self-pay

## 2020-07-19 ENCOUNTER — Ambulatory Visit (INDEPENDENT_AMBULATORY_CARE_PROVIDER_SITE_OTHER): Payer: Medicare Other

## 2020-07-19 VITALS — Ht 66.0 in | Wt 139.0 lb

## 2020-07-19 DIAGNOSIS — Z0000 Encounter for general adult medical examination without abnormal findings: Secondary | ICD-10-CM

## 2020-07-19 NOTE — Patient Instructions (Signed)
Michael Mercado , Thank you for taking time to come for your Medicare Wellness Visit. I appreciate your ongoing commitment to your health goals. Please review the following plan we discussed and let me know if I can assist you in the future.   Screening recommendations/referrals: Colonoscopy: No longer required Recommended yearly ophthalmology/optometry visit for glaucoma screening and checkup Recommended yearly dental visit for hygiene and checkup  Vaccinations: Influenza vaccine: Done per caretaker (need date) Pneumococcal vaccine: Done 05/10/2018 & 02/28/2019 Tdap vaccine: Done 09/23/2016 Shingles vaccine: Shingrix discussed. Please contact your pharmacy for coverage information.    Covid-19: Complete per caretaker: 06/26/19, 07/25/19 and we need date for booster  Advanced directives: Please bring a copy of your health care power of attorney and living will to the office to be added to your chart at your convenience.  Conditions/risks identified: Continue to work on fall prevention - consider having grab bars installed as well as elevated toilet seat and shower chair.  Next appointment: Follow up in one year for your annual wellness visit.   Preventive Care 2 Years and Older, Male  Preventive care refers to lifestyle choices and visits with your health care provider that can promote health and wellness. What does preventive care include?  A yearly physical exam. This is also called an annual well check.  Dental exams once or twice a year.  Routine eye exams. Ask your health care provider how often you should have your eyes checked.  Personal lifestyle choices, including:  Daily care of your teeth and gums.  Regular physical activity.  Eating a healthy diet.  Avoiding tobacco and drug use.  Limiting alcohol use.  Practicing safe sex.  Taking low doses of aspirin every day.  Taking vitamin and mineral supplements as recommended by your health care provider. What happens  during an annual well check? The services and screenings done by your health care provider during your annual well check will depend on your age, overall health, lifestyle risk factors, and family history of disease. Counseling  Your health care provider may ask you questions about your:  Alcohol use.  Tobacco use.  Drug use.  Emotional well-being.  Home and relationship well-being.  Sexual activity.  Eating habits.  History of falls.  Memory and ability to understand (cognition).  Work and work Astronomer. Screening  You may have the following tests or measurements:  Height, weight, and BMI.  Blood pressure.  Lipid and cholesterol levels. These may be checked every 5 years, or more frequently if you are over 77 years old.  Skin check.  Lung cancer screening. You may have this screening every year starting at age 39 if you have a 30-pack-year history of smoking and currently smoke or have quit within the past 15 years.  Fecal occult blood test (FOBT) of the stool. You may have this test every year starting at age 10.  Flexible sigmoidoscopy or colonoscopy. You may have a sigmoidoscopy every 5 years or a colonoscopy every 10 years starting at age 21.  Prostate cancer screening. Recommendations will vary depending on your family history and other risks.  Hepatitis C blood test.  Hepatitis B blood test.  Sexually transmitted disease (STD) testing.  Diabetes screening. This is done by checking your blood sugar (glucose) after you have not eaten for a while (fasting). You may have this done every 1-3 years.  Abdominal aortic aneurysm (AAA) screening. You may need this if you are a current or former smoker.  Osteoporosis. You may be  screened starting at age 83 if you are at high risk. Talk with your health care provider about your test results, treatment options, and if necessary, the need for more tests. Vaccines  Your health care provider may recommend certain  vaccines, such as:  Influenza vaccine. This is recommended every year.  Tetanus, diphtheria, and acellular pertussis (Tdap, Td) vaccine. You may need a Td booster every 10 years.  Zoster vaccine. You may need this after age 71.  Pneumococcal 13-valent conjugate (PCV13) vaccine. One dose is recommended after age 32.  Pneumococcal polysaccharide (PPSV23) vaccine. One dose is recommended after age 14. Talk to your health care provider about which screenings and vaccines you need and how often you need them. This information is not intended to replace advice given to you by your health care provider. Make sure you discuss any questions you have with your health care provider. Document Released: 05/03/2015 Document Revised: 12/25/2015 Document Reviewed: 02/05/2015 Elsevier Interactive Patient Education  2017 [employer].  Fall Prevention in the Home Falls can cause injuries. They can happen to people of all ages. There are many things you can do to make your home safe and to help prevent falls. What can I do on the outside of my home?  Regularly fix the edges of walkways and driveways and fix any cracks.  Remove anything that might make you trip as you walk through a door, such as a raised step or threshold.  Trim any bushes or trees on the path to your home.  Use bright outdoor lighting.  Clear any walking paths of anything that might make someone trip, such as rocks or tools.  Regularly check to see if handrails are loose or broken. Make sure that both sides of any steps have handrails.  Any raised decks and porches should have guardrails on the edges.  Have any leaves, snow, or ice cleared regularly.  Use sand or salt on walking paths during winter.  Clean up any spills in your garage right away. This includes oil or grease spills. What can I do in the bathroom?  Use night lights.  Install grab bars by the toilet and in the tub and shower. Do not use towel bars as grab  bars.  Use non-skid mats or decals in the tub or shower.  If you need to sit down in the shower, use a plastic, non-slip stool.  Keep the floor dry. Clean up any water that spills on the floor as soon as it happens.  Remove soap buildup in the tub or shower regularly.  Attach bath mats securely with double-sided non-slip rug tape.  Do not have throw rugs and other things on the floor that can make you trip. What can I do in the bedroom?  Use night lights.  Make sure that you have a light by your bed that is easy to reach.  Do not use any sheets or blankets that are too big for your bed. They should not hang down onto the floor.  Have a firm chair that has side arms. You can use this for support while you get dressed.  Do not have throw rugs and other things on the floor that can make you trip. What can I do in the kitchen?  Clean up any spills right away.  Avoid walking on wet floors.  Keep items that you use a lot in easy-to-reach places.  If you need to reach something above you, use a strong step stool that has a  grab bar.  Keep electrical cords out of the way.  Do not use floor polish or wax that makes floors slippery. If you must use wax, use non-skid floor wax.  Do not have throw rugs and other things on the floor that can make you trip. What can I do with my stairs?  Do not leave any items on the stairs.  Make sure that there are handrails on both sides of the stairs and use them. Fix handrails that are broken or loose. Make sure that handrails are as long as the stairways.  Check any carpeting to make sure that it is firmly attached to the stairs. Fix any carpet that is loose or worn.  Avoid having throw rugs at the top or bottom of the stairs. If you do have throw rugs, attach them to the floor with carpet tape.  Make sure that you have a light switch at the top of the stairs and the bottom of the stairs. If you do not have them, ask someone to add them for  you. What else can I do to help prevent falls?  Wear shoes that:  Do not have high heels.  Have rubber bottoms.  Are comfortable and fit you well.  Are closed at the toe. Do not wear sandals.  If you use a stepladder:  Make sure that it is fully opened. Do not climb a closed stepladder.  Make sure that both sides of the stepladder are locked into place.  Ask someone to hold it for you, if possible.  Clearly mark and make sure that you can see:  Any grab bars or handrails.  First and last steps.  Where the edge of each step is.  Use tools that help you move around (mobility aids) if they are needed. These include:  Canes.  Walkers.  Scooters.  Crutches.  Turn on the lights when you go into a dark area. Replace any light bulbs as soon as they burn out.  Set up your furniture so you have a clear path. Avoid moving your furniture around.  If any of your floors are uneven, fix them.  If there are any pets around you, be aware of where they are.  Review your medicines with your doctor. Some medicines can make you feel dizzy. This can increase your chance of falling. Ask your doctor what other things that you can do to help prevent falls. This information is not intended to replace advice given to you by your health care provider. Make sure you discuss any questions you have with your health care provider. Document Released: 01/31/2009 Document Revised: 09/12/2015 Document Reviewed: 05/11/2014 Elsevier Interactive Patient Education  2017 [employer].

## 2020-07-19 NOTE — Progress Notes (Signed)
Subjective:   Michael Mercado is a 80 y.o. male who presents for Medicare Annual/Subsequent preventive examination.  Virtual Visit via Telephone Note  I connected with  Michael Mercado on 07/19/20 at  8:15 AM EDT by telephone and verified that I am speaking with the correct person using two identifiers.  Location: Patient: Home [facility]) Provider: [facility] Persons participating in the virtual visit: patient's care taker/Nurse Health Advisor   I discussed the limitations, risks, security and privacy concerns of performing an evaluation and management service by telephone and the availability of in person appointments. The patient expressed understanding and agreed to proceed.  Interactive audio and video telecommunications were attempted between this nurse and patient, however failed, due to patient having technical difficulties OR patient did not have access to video capability.  We continued and completed visit with audio only.  Some vital signs may be absent or patient reported.   Tamaira Ciriello E Hildur Bayer, LPN   Review of Systems     Cardiac Risk Factors include: advanced age (>36men, >1 women);dyslipidemia;hypertension     Objective:    Today's Vitals   07/19/20 0810  Weight: 139 lb (63 kg)  Height: 5\' 6"  (1.676 m)   Body mass index is 22.44 kg/m.  Advanced Directives 07/19/2020 06/13/2019 03/07/2019 09/05/2018 08/25/2018 07/20/2018 07/19/2018  Does Patient Have a Medical Advance Directive? No No No No No - No  Would patient like information on creating a medical advance directive? No - Patient declined No - Patient declined - No - Patient declined No - Patient declined No - Patient declined -    Current Medications (verified) Outpatient Encounter Medications as of 07/19/2020  Medication Sig  . ACETAMINOPHEN EXTRA STRENGTH 500 MG tablet TAKE 2 TABS BY MOUTH EVERY 4 HRS AS NEEDED FOR HEADACHE/MILD TO MODERATE PAIN OR TEMP OF 100.3 & ABOVE IF TEMP UNRESOLVED AFTER 24 HRS  MUST CALL NURSE TO FOLLOW UP W/ MD.  . amLODipine (NORVASC) 5 MG tablet TAKE (1) TABLET BY MOUTH ONCE DAILY.  09/18/2020 ARIPiprazole (ABILIFY) 2 MG tablet Take 2 mg by mouth daily.  [name] BANOPHEN 25 MG capsule TAKE 1 CAPSULE BY MOUTH DAILY AS NEEDED FOR ALLERGY SYMPTOMS. CONTACT NURSE IF SYMPTOMS WORSEN.  . cetirizine (ZYRTEC) 10 MG tablet TAKE (1) TABLET BY MOUTH ONCE DAILY.  [name] Diaper Rash Products (DESITIN) OINT Apply 1 application topically 4 (four) times daily as needed.  . divalproex (DEPAKOTE) 250 MG DR tablet Take by mouth.  . FEROSUL 325 (65 Fe) MG tablet Take 325 mg by mouth 2 (two) times daily.  . fluticasone furoate-vilanterol (BREO ELLIPTA) 100-25 MCG/INH AEPB Inhale 1 puff into the lungs daily.  [name] lisinopril (ZESTRIL) 20 MG tablet TAKE (1) TABLET BY MOUTH ONCE DAILY.  . pantoprazole (PROTONIX) 40 MG tablet Take 1 tablet (40 mg total) by mouth daily.  [name] PARoxetine (PAXIL) 20 MG tablet Take 1 tablet (20 mg total) by mouth at bedtime.  . Skin Protectants, Misc. (PERIGUARD) OINT Apply topically.  . triamcinolone cream (KENALOG) 0.1 % APPLY TOPICALLY TO AFFECTED AREA(S) TWICE DAILY.  . chlorthalidone (HYGROTON) 25 MG tablet Take by mouth. (Patient not taking: Reported on 07/19/2020)  . levETIRAcetam (KEPPRA) 500 MG tablet Take 1 tablet (500 mg total) by mouth 2 (two) times daily. (Patient not taking: Reported on 07/19/2020)  . nicotine (NICODERM CQ - DOSED IN MG/24 HR) 7 mg/24hr patch PLACE 1 PATCH ONTO SKIN ONCE DAILY. START 7MG  ONCE 14MG  IS USED. (Patient not taking: Reported on  07/19/2020)   No facility-administered encounter medications on file as of 07/19/2020.    Allergies (verified) Patient has no known allergies.   History: Past Medical History:  Diagnosis Date  . Chronic kidney disease   . Hypercholesteremia   . Hypertension   . Moderate intellectual disability   . Mood disorder (HCC)   . MR (mental retardation)   . Obesity    Past Surgical History:  Procedure Laterality Date  .  CRANIOTOMY Left 07/07/2018   Procedure: CRANIOTOMY HEMATOMA EVACUATION SUBDURAL;  Surgeon: Tia Alert, MD;  Location: [facility] OR;  Service: Neurosurgery;  Laterality: Left;  . CRANIOTOMY Left 07/13/2018   Procedure: Redo Left CRANIOTOMY FOR SUBDURAL HEMATOMA;  Surgeon: Tia Alert, MD;  Location: [facility] OR;  Service: Neurosurgery;  Laterality: Left;   Family History  Problem Relation Age of Onset  . Stroke Sister    Social History   Socioeconomic History  . Marital status: Single    Spouse name: Not on file  . Number of children: Not on file  . Years of education: Not on file  . Highest education level: Not on file  Occupational History  . Occupation: disabled  Tobacco Use  . Smoking status: Former Smoker    Packs/day: 0.33    Types: Cigarettes    Quit date: 09/02/2018    Years since quitting: 1.8  . Smokeless tobacco: Never Used  Vaping Use  . Vaping Use: Never used  Substance and Sexual Activity  . Alcohol use: No    Alcohol/week: 0.0 standard drinks  . Drug use: No  . Sexual activity: Never  Other Topics Concern  . Not on file  Social History Narrative   Living in [facility]   Social Determinants of Health   Financial Resource Strain: Low Risk   . Difficulty of Paying Living Expenses: Not hard at all  Food Insecurity: No Food Insecurity  . Worried About Programme researcher, broadcasting/film/video in the Last Year: Never true  . Ran Out of Food in the Last Year: Never true  Transportation Needs: No Transportation Needs  . Lack of Transportation (Medical): No  . Lack of Transportation (Non-Medical): No  Physical Activity: Sufficiently Active  . Days of Exercise per Week: 7 days  . Minutes of Exercise per Session: 30 min  Stress: No Stress Concern Present  . Feeling of Stress : Only a little  Social Connections: Moderately Isolated  . Frequency of Communication with Friends and Family: Twice a week  . Frequency of Social Gatherings with Friends and Family: Once a week  . Attends  Religious Services: Never  . Active Member of Clubs or Organizations: Yes  . Attends Banker Meetings: More than 4 times per year  . Marital Status: Never married    Tobacco Counseling Counseling given: Not Answered   Clinical Intake:  Pre-visit preparation completed: Yes  Pain : No/denies pain     BMI - recorded: 22.44 Nutritional Status: BMI of 19-24  Normal Nutritional Risks: None  How often do you need to have someone help you when you read instructions, pamphlets, or other written materials from your doctor or pharmacy?: 5 - Always  Diabetic? No  Interpreter Needed?: Yes Interpreter Agency: Group Home - patient speaks English, but doesn't understand Patient Declined Interpreter : No Patient signed  waiver: No  Information entered by :: Azavion Bouillon, LPN   Activities of Daily Living In your present state of health, do you have any difficulty performing  the following activities: 07/19/2020  Hearing? N  Vision? N  Difficulty concentrating or making decisions? Y  Walking or climbing stairs? N  Dressing or bathing? Y  Comment He can mostly do on his own, but has assistance  Doing errands, shopping? Y  Preparing Food and eating ? Y  Using the Toilet? N  In the past six months, have you accidently leaked urine? N  Do you have problems with loss of bowel control? N  Managing your Medications? Y  Managing your Finances? Y  Housekeeping or managing your Housekeeping? Y  Some recent data might be hidden    Patient Care Team: Dettinger, Elige RadonJoshua A, MD as PCP - General (Family Medicine) Antonietta Breachhandler, Mark C, MD as Referring Physician (Neurology) Randa LynnBhutani, Manpreet S, MD as Consulting Physician (Nephrology)  Indicate any recent Medical Services you may have received from other than [facility] providers in the past year (date may be approximate).     Assessment:   This is a routine wellness examination for Michael Mercado.  Hearing/Vision screen  Hearing  Screening   125Hz  250Hz  500Hz  1000Hz  2000Hz  3000Hz  4000Hz  6000Hz  8000Hz   Right ear:           Left ear:           Comments: No complaints of hearing loss  Vision Screening Comments: Wears glasses - Annual visits with Dr Mayford Knifeurner - appointment 09/2020  Dietary issues and exercise activities discussed: Current Exercise Habits: Home exercise routine, Type of exercise: walking, Time (Minutes): 30, Frequency (Times/Week): 7, Weekly Exercise (Minutes/Week): 210, Intensity: Mild, Exercise limited by: None identified  Goals    . Exercise daily  (15 min per time)     Walk for 15 minutes daily.     . Patient Stated     06/13/2019 AWV Goal: Exercise for General Health   Patient will verbalize understanding of the benefits of increased physical activity:  Exercising regularly is important. It will improve your overall fitness, flexibility, and endurance.  Regular exercise also will improve your overall health. It can help you control your weight, reduce stress, and improve your bone density.  Over the next year, patient will increase physical activity as tolerated with a goal of at least 150 minutes of moderate physical activity per week.   You can tell that you are exercising at a moderate intensity if your heart starts beating faster and you start breathing faster but can still hold a conversation.  Moderate-intensity exercise ideas include:  Walking 1 mile (1.6 km) in about 15 minutes  Biking  Hiking  Golfing  Dancing  Water aerobics  Patient will verbalize understanding of everyday activities that increase physical activity by providing examples like the following: ? Yard work, such as: ? Pushing a Surveyor, mininglawn mower ? Raking and bagging leaves ? Washing your car ? Pushing a stroller ? Shoveling snow ? Gardening ? Washing windows or floors  Patient will be able to explain general safety guidelines for exercising:   Before you start a new exercise program, talk with your health  care provider.  Do not exercise so much that you hurt yourself, feel dizzy, or get very short of breath.  Wear comfortable clothes and wear shoes with good support.  Drink plenty of water while you exercise to prevent dehydration or heat stroke.  Work out until your breathing and your heartbeat get faster.       Depression Screen PHQ 2/9 Scores 07/19/2020 06/13/2020 12/13/2019 06/13/2019 05/17/2019 05/02/2019 12/22/2018  PHQ - 2 Score 0  0 0 0 0 - 0  PHQ- 9 Score - - - - - - -  Exception Documentation - - - - - Other- indicate reason in comment box -  Not completed - - - - - Patient is not able to answer they questions and group home states he has been acting different -    Fall Risk Fall Risk  07/19/2020 06/13/2020 12/13/2019 06/13/2019 05/17/2019  Falls in the past year? 0 0 0 1 1  Number falls in past yr: 0 - - 0 0  Injury with Fall? 0 - - 1 1  Comment - - - head trauma -  Risk for fall due to : Impaired balance/gait;Medication side effect - - History of fall(s) -  Follow up Falls prevention discussed - - Falls prevention discussed -    FALL RISK PREVENTION PERTAINING TO THE HOME:  Any stairs in or around the home? No  If so, are there any without handrails? No  Home free of loose throw rugs in walkways, pet beds, electrical cords, etc? Yes  Adequate lighting in your home to reduce risk of falls? Yes   ASSISTIVE DEVICES UTILIZED TO PREVENT FALLS:  Life alert? No  Use of a cane, walker or w/c? No  Grab bars in the bathroom? No  - He is high fall risk as he doesn't pay attention to where he is going and is clumsy - Caretaker is going to check into safety devices to place in home. Shower chair or bench in shower? No  Elevated toilet seat or a handicapped toilet? No   TIMED UP AND GO:  Was the test performed? No . Telephonic visit.  Cognitive Function: MMSE - Mini Mental State Exam 05/10/2018 09/23/2016  Not completed: Unable to complete Unable to complete     6CIT Screen 06/13/2019  06/13/2019 06/13/2019  What Year? 4 points 0 points 0 points  What month? 3 points 0 points 0 points  What time? 3 points 0 points -  Count back from 20 4 points 0 points -  Months in reverse 4 points 0 points -  Repeat phrase 10 points 0 points -  Total Score 28 0 -    Immunizations Immunization History  Administered Date(s) Administered  . Fluad Quad(high Dose 65+) 02/28/2019  . Influenza Inj Mdck Quad Pf 02/28/2019  . Influenza, High Dose Seasonal PF 01/21/2017, 01/12/2018  . Influenza,inj,Quad PF,6+ Mos 03/18/2016  . Influenza-Unspecified 02/21/2014, 01/28/2015  . Moderna Sars-Covid-2 Vaccination 06/26/2019, 07/25/2019  . Pneumococcal Conjugate-13 05/10/2018  . Pneumococcal Polysaccharide-23 02/28/2019  . Tdap 07/19/2014, 09/23/2016    TDAP status: Up to date  Flu Vaccine status: Up to date  Pneumococcal vaccine status: Up to date  Covid-19 vaccine status: Completed vaccines  Qualifies for Shingles Vaccine? Yes   Zostavax completed No   Shingrix Completed?: No.    Education has been provided regarding the importance of this vaccine. Patient has been advised to call insurance company to determine out of pocket expense if they have not yet received this vaccine. Advised may also receive vaccine at local pharmacy or Health Dept. Verbalized acceptance and understanding.  Screening Tests Health Maintenance  Topic Date Due  . COVID-19 Vaccine (3 - Booster for Moderna series) 12/11/2020 (Originally 01/24/2020)  . INFLUENZA VACCINE  11/18/2020  . TETANUS/TDAP  09/24/2026  . PNA vac Low Risk Adult  Completed  . HPV VACCINES  Aged Out  . Hepatitis C Screening  Discontinued    Health Maintenance  There are  no preventive care reminders to display for this patient.  Colorectal cancer screening: No longer required.   Lung Cancer Screening: (Low Dose CT Chest recommended if Age 53-80 years, 30 pack-year currently smoking OR have quit w/in 15years.) does not qualify.    Additional Screening:  Hepatitis C Screening: does not qualify.  Vision Screening: Recommended annual ophthalmology exams for early detection of glaucoma and other disorders of the eye. Is the patient up to date with their annual eye exam?  Yes  Who is the provider or what is the name of the office in which the patient attends annual eye exams? Dr Mayford Knife If pt is not established with a provider, would they like to be referred to a provider to establish care? No .   Dental Screening: Recommended annual dental exams for proper oral hygiene  Community Resource Referral / Chronic Care Management: CRR required this visit?  No   CCM required this visit?  No      Plan:     I have personally reviewed and noted the following in the patient's chart:   . Medical and social history . Use of alcohol, tobacco or illicit drugs  . Current medications and supplements . Functional ability and status . Nutritional status . Physical activity . Advanced directives . List of other physicians . Hospitalizations, surgeries, and ER visits in previous 12 months . Vitals . Screenings to include cognitive, depression, and falls . Referrals and appointments  In addition, I have reviewed and discussed with patient certain preventive protocols, quality metrics, and best practice recommendations. A written personalized care plan for preventive services as well as general preventive health recommendations were provided to patient.     [place] Constable, LPN   05/21/3084   Nurse Notes: Patient had covid booster before coming to [facility] recently, but can't find documentation - will call back with this information.

## 2020-08-07 DIAGNOSIS — F319 Bipolar disorder, unspecified: Secondary | ICD-10-CM | POA: Diagnosis not present

## 2020-09-30 DIAGNOSIS — E11319 Type 2 diabetes mellitus with unspecified diabetic retinopathy without macular edema: Secondary | ICD-10-CM | POA: Diagnosis not present

## 2020-09-30 DIAGNOSIS — H35033 Hypertensive retinopathy, bilateral: Secondary | ICD-10-CM | POA: Diagnosis not present

## 2020-09-30 DIAGNOSIS — H524 Presbyopia: Secondary | ICD-10-CM | POA: Diagnosis not present

## 2020-09-30 LAB — HM DIABETES EYE EXAM

## 2020-11-19 ENCOUNTER — Other Ambulatory Visit: Payer: Self-pay | Admitting: Family Medicine

## 2020-11-19 DIAGNOSIS — J439 Emphysema, unspecified: Secondary | ICD-10-CM

## 2020-11-27 DIAGNOSIS — D508 Other iron deficiency anemias: Secondary | ICD-10-CM | POA: Diagnosis not present

## 2020-11-27 DIAGNOSIS — E559 Vitamin D deficiency, unspecified: Secondary | ICD-10-CM | POA: Diagnosis not present

## 2020-11-27 DIAGNOSIS — N1831 Chronic kidney disease, stage 3a: Secondary | ICD-10-CM | POA: Diagnosis not present

## 2020-11-27 DIAGNOSIS — E875 Hyperkalemia: Secondary | ICD-10-CM | POA: Diagnosis not present

## 2020-11-27 DIAGNOSIS — I129 Hypertensive chronic kidney disease with stage 1 through stage 4 chronic kidney disease, or unspecified chronic kidney disease: Secondary | ICD-10-CM | POA: Diagnosis not present

## 2020-12-04 DIAGNOSIS — D508 Other iron deficiency anemias: Secondary | ICD-10-CM | POA: Diagnosis not present

## 2020-12-04 DIAGNOSIS — N1831 Chronic kidney disease, stage 3a: Secondary | ICD-10-CM | POA: Diagnosis not present

## 2020-12-04 DIAGNOSIS — I129 Hypertensive chronic kidney disease with stage 1 through stage 4 chronic kidney disease, or unspecified chronic kidney disease: Secondary | ICD-10-CM | POA: Diagnosis not present

## 2020-12-10 DIAGNOSIS — I952 Hypotension due to drugs: Secondary | ICD-10-CM | POA: Diagnosis not present

## 2020-12-10 DIAGNOSIS — R296 Repeated falls: Secondary | ICD-10-CM | POA: Diagnosis not present

## 2020-12-10 DIAGNOSIS — Z79899 Other long term (current) drug therapy: Secondary | ICD-10-CM | POA: Diagnosis not present

## 2020-12-10 DIAGNOSIS — F0391 Unspecified dementia with behavioral disturbance: Secondary | ICD-10-CM | POA: Diagnosis not present

## 2020-12-12 ENCOUNTER — Encounter: Payer: Self-pay | Admitting: Family Medicine

## 2020-12-12 ENCOUNTER — Ambulatory Visit (INDEPENDENT_AMBULATORY_CARE_PROVIDER_SITE_OTHER): Payer: Medicare Other | Admitting: Family Medicine

## 2020-12-12 VITALS — BP 138/72 | HR 76 | Temp 97.6°F | Resp 20 | Ht 66.0 in | Wt 136.0 lb

## 2020-12-12 DIAGNOSIS — N182 Chronic kidney disease, stage 2 (mild): Secondary | ICD-10-CM

## 2020-12-12 DIAGNOSIS — E785 Hyperlipidemia, unspecified: Secondary | ICD-10-CM | POA: Diagnosis not present

## 2020-12-12 DIAGNOSIS — J449 Chronic obstructive pulmonary disease, unspecified: Secondary | ICD-10-CM | POA: Diagnosis not present

## 2020-12-12 DIAGNOSIS — I10 Essential (primary) hypertension: Secondary | ICD-10-CM | POA: Diagnosis not present

## 2020-12-12 NOTE — Progress Notes (Signed)
 BP 138/72   Pulse 76   Temp 97.6 F (36.4 C) (Temporal)   Resp 20   Ht 5' 6" (1.676 m)   Wt 136 lb (61.7 kg)   SpO2 97%   BMI 21.95 kg/m    Subjective:   Patient ID: Michael Mercado, male    DOB: 09/10/1940, 80 y.o.   MRN: [id number]  HPI: Michael Mercado is a 80 y.o. male presenting on 12/12/2020 for Medical Management of Chronic Issues   HPI Hypertension Patient is currently on amlodipine and chlorthalidone and lisinopril, and their blood pressure today is 138/72. Patient denies any lightheadedness or dizziness. Patient denies headaches, blurred vision, chest pains, shortness of breath, or weakness. Denies any side effects from medication and is content with current medication.   Hyperlipidemia Patient is coming in for recheck of his hyperlipidemia. The patient is currently taking no medication currently we are monitoring. They deny any issues with myalgias or history of liver damage from it. They deny any focal numbness or weakness or chest pain.   COPD Patient is coming in for COPD recheck today.  He is currently on Breo Ellipta.  He has a mild chronic cough but denies any major coughing spells or wheezing spells.  He has 0nighttime symptoms per week and 0daytime symptoms per week currently.   Stage III CKD recheck Patient is coming in for recheck of stage III kidney disease denies any urinary issues and we will recheck his kidney levels today.  Patient lives in [place], does have some baseline confusion, nothing is changed recently  Relevant past medical, surgical, family and social history reviewed and updated as indicated. Interim medical history since our last visit reviewed. Allergies and medications reviewed and updated.  Review of Systems  Constitutional:  Negative for chills and fever.  Eyes:  Negative for visual disturbance.  Respiratory:  Negative for shortness of breath and wheezing.   Cardiovascular:  Negative for chest pain and leg swelling.   Musculoskeletal:  Negative for back pain and gait problem.  Skin:  Negative for rash.  Psychiatric/Behavioral:  Positive for confusion.   All other systems reviewed and are negative.  Per HPI unless specifically indicated above   Allergies as of 12/12/2020   No Known Allergies      Medication List        Accurate as of December 12, 2020 11:31 AM. If you have any questions, ask your nurse or doctor.          Acetaminophen Extra Strength 500 MG tablet Generic drug: acetaminophen TAKE 2 TABS BY MOUTH EVERY 4 HRS AS NEEDED FOR HEADACHE/MILD TO MODERATE PAIN OR TEMP OF 100.3 & ABOVE IF TEMP UNRESOLVED AFTER 24 HRS MUST CALL NURSE TO FOLLOW UP W/ MD.   amLODipine 5 MG tablet Commonly known as: NORVASC TAKE (1) TABLET BY MOUTH ONCE DAILY.   ARIPiprazole 2 MG tablet Commonly known as: ABILIFY Take 2 mg by mouth daily.   Banophen 25 mg capsule Generic drug: diphenhydrAMINE TAKE 1 CAPSULE BY MOUTH DAILY AS NEEDED FOR ALLERGY SYMPTOMS. CONTACT NURSE IF SYMPTOMS WORSEN.   Breo Ellipta 100-25 MCG/INH Aepb Generic drug: fluticasone furoate-vilanterol INHALE 1 PUFF ONCE DAILY.   cetirizine 10 MG tablet Commonly known as: ZYRTEC TAKE (1) TABLET BY MOUTH ONCE DAILY.   chlorthalidone 25 MG tablet Commonly known as: HYGROTON Take by mouth.   Desitin Oint Apply 1 application topically 4 (four) times daily as needed.   divalproex 250 MG DR tablet Commonly   known as: DEPAKOTE Take by mouth.   FeroSul 325 (65 FE) MG tablet Generic drug: ferrous sulfate Take 325 mg by mouth 2 (two) times daily.   levETIRAcetam 500 MG tablet Commonly known as: KEPPRA Take 1 tablet (500 mg total) by mouth 2 (two) times daily.   lisinopril 20 MG tablet Commonly known as: ZESTRIL TAKE (1) TABLET BY MOUTH ONCE DAILY.   nicotine 7 mg/24hr patch Commonly known as: NICODERM CQ - dosed in mg/24 hr PLACE 1 PATCH ONTO SKIN ONCE DAILY. START 7MG ONCE 14MG IS USED.   pantoprazole 40 MG  tablet Commonly known as: PROTONIX Take 1 tablet (40 mg total) by mouth daily.   PARoxetine 20 MG tablet Commonly known as: PAXIL Take 1 tablet (20 mg total) by mouth at bedtime.   PeriGuard Oint Apply topically.   triamcinolone cream 0.1 % Commonly known as: KENALOG APPLY TOPICALLY TO AFFECTED AREA(S) TWICE DAILY.         Objective:   BP 138/72   Pulse 76   Temp 97.6 F (36.4 C) (Temporal)   Resp 20   Ht 5' 6" (1.676 m)   Wt 136 lb (61.7 kg)   SpO2 97%   BMI 21.95 kg/m   Wt Readings from Last 3 Encounters:  12/12/20 136 lb (61.7 kg)  07/19/20 139 lb (63 kg)  06/13/20 139 lb (63 kg)    Physical Exam Vitals and nursing note reviewed.  Constitutional:      General: He is not in acute distress.    Appearance: He is well-developed. He is not diaphoretic.  Eyes:     General: No scleral icterus.    Conjunctiva/sclera: Conjunctivae normal.  Neck:     Thyroid: No thyromegaly.  Cardiovascular:     Rate and Rhythm: Normal rate and regular rhythm.     Heart sounds: Normal heart sounds. No murmur heard. Pulmonary:     Effort: Pulmonary effort is normal. No respiratory distress.     Breath sounds: Normal breath sounds. No wheezing.  Musculoskeletal:        General: Normal range of motion.     Cervical back: Neck supple.  Lymphadenopathy:     Cervical: No cervical adenopathy.  Skin:    General: Skin is warm and dry.     Findings: No rash.  Neurological:     Mental Status: He is alert and oriented to person, place, and time.     Coordination: Coordination normal.  Psychiatric:        Behavior: Behavior normal.    Results for orders placed or performed in visit on 10/10/20  HM DIABETES EYE EXAM  Result Value Ref Range   HM Diabetic Eye Exam No Retinopathy No Retinopathy    Assessment & Plan:   Problem List Items Addressed This Visit       Cardiovascular and Mediastinum   HTN (hypertension), benign - Primary   Relevant Orders   CBC with  Differential/Platelet   CMP14+EGFR     Respiratory   COPD (chronic obstructive pulmonary disease) (HCC)     Genitourinary   CKD (chronic kidney disease), stage II   Relevant Orders   CMP14+EGFR     Other   Hyperlipidemia LDL goal <130   Relevant Orders   Lipid panel    Will check blood work, continue current medicine no change. Follow up plan: Return in about 6 months (around 06/14/2021), or if symptoms worsen or fail to improve, for Physical exam and recheck cholesterol and hypertension.  Counseling provided for all of the vaccine components Orders Placed This Encounter  Procedures   CBC with Differential/Platelet   CMP14+EGFR   Lipid panel     , MD [facility] 12/12/2020, 11:31 AM     

## 2020-12-13 LAB — LIPID PANEL
Chol/HDL Ratio: 2.8 ratio (ref 0.0–5.0)
Cholesterol, Total: 154 mg/dL (ref 100–199)
HDL: 56 mg/dL (ref >39)
LDL Chol Calc (NIH): 78 mg/dL (ref 0–99)
Triglycerides: 113 mg/dL (ref 0–149)
VLDL Cholesterol Cal: 20 mg/dL (ref 5–40)

## 2020-12-13 LAB — CBC WITH DIFFERENTIAL/PLATELET
Basophils Absolute: 0 10*3/uL (ref 0.0–0.2)
Basos: 1 %
EOS (ABSOLUTE): 0.1 10*3/uL (ref 0.0–0.4)
Eos: 1 %
Hematocrit: 41.9 % (ref 37.5–51.0)
Hemoglobin: 14.4 g/dL (ref 13.0–17.7)
Immature Grans (Abs): 0 10*3/uL (ref 0.0–0.1)
Immature Granulocytes: 0 %
Lymphocytes Absolute: 1.1 10*3/uL (ref 0.7–3.1)
Lymphs: 17 %
MCH: 30.6 pg (ref 26.6–33.0)
MCHC: 34.4 g/dL (ref 31.5–35.7)
MCV: 89 fL (ref 79–97)
Monocytes Absolute: 0.3 10*3/uL (ref 0.1–0.9)
Monocytes: 5 %
Neutrophils Absolute: 4.9 10*3/uL (ref 1.4–7.0)
Neutrophils: 76 %
Platelets: 144 10*3/uL — ABNORMAL LOW (ref 150–450)
RBC: 4.7 x10E6/uL (ref 4.14–5.80)
RDW: 12.8 % (ref 11.6–15.4)
WBC: 6.4 10*3/uL (ref 3.4–10.8)

## 2020-12-13 LAB — CMP14+EGFR
ALT: 13 IU/L (ref 0–44)
AST: 32 IU/L (ref 0–40)
Albumin/Globulin Ratio: 2 (ref 1.2–2.2)
Albumin: 4.5 g/dL (ref 3.7–4.7)
Alkaline Phosphatase: 76 IU/L (ref 44–121)
BUN/Creatinine Ratio: 18 (ref 10–24)
BUN: 22 mg/dL (ref 8–27)
Bilirubin Total: 0.3 mg/dL (ref 0.0–1.2)
CO2: 23 mmol/L (ref 20–29)
Calcium: 9.9 mg/dL (ref 8.6–10.2)
Chloride: 100 mmol/L (ref 96–106)
Creatinine, Ser: 1.24 mg/dL (ref 0.76–1.27)
Globulin, Total: 2.3 g/dL (ref 1.5–4.5)
Glucose: 99 mg/dL (ref 65–99)
Potassium: 5.8 mmol/L — ABNORMAL HIGH (ref 3.5–5.2)
Sodium: 141 mmol/L (ref 134–144)
Total Protein: 6.8 g/dL (ref 6.0–8.5)
eGFR: 60 mL/min/{1.73_m2} (ref >59)

## 2020-12-18 DIAGNOSIS — F319 Bipolar disorder, unspecified: Secondary | ICD-10-CM | POA: Diagnosis not present

## 2020-12-23 DIAGNOSIS — Z20822 Contact with and (suspected) exposure to covid-19: Secondary | ICD-10-CM | POA: Diagnosis not present

## 2021-01-02 ENCOUNTER — Encounter: Payer: Self-pay | Admitting: *Deleted

## 2021-02-05 ENCOUNTER — Other Ambulatory Visit: Payer: Self-pay | Admitting: Family Medicine

## 2021-02-27 DIAGNOSIS — Z23 Encounter for immunization: Secondary | ICD-10-CM | POA: Diagnosis not present

## 2021-02-28 DIAGNOSIS — U071 COVID-19: Secondary | ICD-10-CM | POA: Diagnosis not present

## 2021-03-10 ENCOUNTER — Other Ambulatory Visit: Payer: Self-pay | Admitting: Family Medicine

## 2021-03-10 DIAGNOSIS — L209 Atopic dermatitis, unspecified: Secondary | ICD-10-CM

## 2021-03-11 NOTE — Telephone Encounter (Signed)
Last OV 11/2020. Last RF 02/2020. Next OV 06/16/2021

## 2021-03-17 ENCOUNTER — Ambulatory Visit (INDEPENDENT_AMBULATORY_CARE_PROVIDER_SITE_OTHER): Payer: Medicare Other | Admitting: Family Medicine

## 2021-03-17 ENCOUNTER — Encounter: Payer: Self-pay | Admitting: Family Medicine

## 2021-03-17 VITALS — BP 140/73 | HR 64 | Temp 97.1°F | Ht 66.0 in | Wt 140.0 lb

## 2021-03-17 DIAGNOSIS — L03119 Cellulitis of unspecified part of limb: Secondary | ICD-10-CM

## 2021-03-17 MED ORDER — CEPHALEXIN 500 MG PO CAPS: 500.0000 mg | ORAL_CAPSULE | Freq: Two times a day (BID) | ORAL | 0 refills | Status: AC

## 2021-03-17 NOTE — Progress Notes (Signed)
Acute Office Visit  Subjective:    Patient ID: Michael Mercado, male    DOB: 1940/10/12, 80 y.o.   MRN: [id number]  Chief Complaint  Patient presents with   Cellulitis    HPI Patient is in today for possible cellulitis on both arms. He is here with his caregiver. He is a resident of [facility]. She reports that Michael Mercado frequently scratches his arms. He normally uses kenalog cream to help with this but has been out. She noticed increased redness and clear/bloody drainage about 4 days ago. Denies fever.   Past Medical History:  Diagnosis Date   Chronic kidney disease    Hypercholesteremia    Hypertension    Moderate intellectual disability    Mood disorder ([place])    MR (mental retardation)    Obesity     Past Surgical History:  Procedure Laterality Date   CRANIOTOMY Left 07/07/2018   Procedure: CRANIOTOMY HEMATOMA EVACUATION SUBDURAL;  Surgeon: Eustace Moore, MD;  Location: [facility];  Service: Neurosurgery;  Laterality: Left;   CRANIOTOMY Left 07/13/2018   Procedure: Redo Left CRANIOTOMY FOR SUBDURAL HEMATOMA;  Surgeon: Eustace Moore, MD;  Location: [facility];  Service: Neurosurgery;  Laterality: Left;    Family History  Problem Relation Age of Onset   Stroke Sister     Social History   Socioeconomic History   Marital status: Single    Spouse name: Not on file   Number of children: Not on file   Years of education: Not on file   Highest education level: Not on file  Occupational History   Occupation: disabled  Tobacco Use   Smoking status: Former    Packs/day: 0.33    Types: Cigarettes    Quit date: 09/02/2018    Years since quitting: 2.5   Smokeless tobacco: Never  Vaping Use   Vaping Use: Never used  Substance and Sexual Activity   Alcohol use: No    Alcohol/week: 0.0 standard drinks   Drug use: No   Sexual activity: Never  Other Topics Concern   Not on file  Social History Narrative   Living in [place] Strain: Low Risk    Difficulty of Paying Living Expenses: Not hard at all  Food Insecurity: No Food Insecurity   Worried About Charity fundraiser in the Last Year: Never true   [place] in the Last Year: Never true  Transportation Needs: No Transportation Needs   Lack of Transportation (Medical): No   Lack of Transportation (Non-Medical): No  Physical Activity: Sufficiently Active   Days of Exercise per Week: 7 days   Minutes of Exercise per Session: 30 min  Stress: No Stress Concern Present   Feeling of Stress : Only a little  Social Connections: Moderately Isolated   Frequency of Communication with Friends and Family: Twice a week   Frequency of Social Gatherings with Friends and Family: Once a week   Attends Religious Services: Never   Marine scientist or Organizations: Yes   Attends Music therapist: More than 4 times per year   Marital Status: Never married  Human resources officer Violence: Not At Risk   Fear of Current or Ex-Partner: No   Emotionally Abused: No   Physically Abused: No   Sexually Abused: No    Outpatient Medications Prior to Visit  Medication Sig Dispense Refill   ACETAMINOPHEN EXTRA STRENGTH 500  MG tablet TAKE 2 TABS BY MOUTH EVERY 4 HRS AS NEEDED FOR HEADACHE/MILD TO MODERATE PAIN OR TEMP OF 100.3 & ABOVE IF TEMP UNRESOLVED AFTER 24 HRS MUSTCALL NURSE TO FOLLOW UP W/ MD. 30 tablet 11   amLODipine (NORVASC) 5 MG tablet TAKE (1) TABLET BY MOUTH ONCE DAILY. 90 tablet 3   ARIPiprazole (ABILIFY) 2 MG tablet Take 2 mg by mouth daily.     BANOPHEN 25 MG capsule TAKE 1 CAPSULE BY MOUTH DAILY AS NEEDED FOR ALLERGY SYMPTOMS. CONTACT NURSE IF SYMPTOMS WORSEN. 30 capsule 11   BREO ELLIPTA 100-25 MCG/INH AEPB INHALE 1 PUFF ONCE DAILY. 60 each 11   cetirizine (ZYRTEC) 10 MG tablet TAKE (1) TABLET BY MOUTH ONCE DAILY. 30 tablet 11   chlorthalidone (HYGROTON) 25 MG tablet Take by mouth.     Diaper Rash Products (DESITIN) OINT  Apply 1 application topically 4 (four) times daily as needed. 99 g 3   divalproex (DEPAKOTE) 250 MG DR tablet Take by mouth.     FEROSUL 325 (65 Fe) MG tablet Take 325 mg by mouth 2 (two) times daily.     levETIRAcetam (KEPPRA) 500 MG tablet Take 1 tablet (500 mg total) by mouth 2 (two) times daily. 180 tablet 3   lisinopril (ZESTRIL) 20 MG tablet TAKE (1) TABLET BY MOUTH ONCE DAILY. 90 tablet 3   pantoprazole (PROTONIX) 40 MG tablet Take 1 tablet (40 mg total) by mouth daily. 90 tablet 3   PARoxetine (PAXIL) 20 MG tablet Take 1 tablet (20 mg total) by mouth at bedtime. 90 tablet 3   Skin Protectants, Misc. (PERIGUARD) OINT Apply topically.     triamcinolone cream (KENALOG) 0.1 % APPLY TOPICALLY TO AFFECTED AREA(S) TWICE DAILY. 80 g 4   nicotine (NICODERM CQ - DOSED IN MG/24 HR) 7 mg/24hr patch PLACE 1 PATCH ONTO SKIN ONCE DAILY. START 7MG ONCE 14MG IS USED. (Patient not taking: Reported on 03/17/2021) 28 patch 11   No facility-administered medications prior to visit.    No Known Allergies  Review of Systems As per HPI.     Objective:    Physical Exam Vitals and nursing note reviewed.  Constitutional:      General: He is not in acute distress.    Appearance: He is not ill-appearing, toxic-appearing or diaphoretic.  Pulmonary:     Effort: Pulmonary effort is normal. No respiratory distress.  Skin:    Findings: Erythema (lacerations with bloody drainge present to forearms bilaterally. Surrounding erythema and warmth present. No swelling) present.  Neurological:     Mental Status: He is alert. Mental status is at baseline.  Psychiatric:        Behavior: Behavior normal.    BP 140/73   Pulse 64   Temp (!) 97.1 F (36.2 C) (Temporal)   Ht _0  (1.676 m)   Wt 140 lb (63.5 kg)   BMI 22.60 kg/m  Wt Readings from Last 3 Encounters:  03/17/21 140 lb (63.5 kg)  12/12/20 136 lb (61.7 kg)  07/19/20 139 lb (63 kg)    Health Maintenance Due  Topic Date Due   Zoster Vaccines-  Shingrix (1 of 2) Never done   COVID-19 Vaccine (3 - Moderna risk series) 08/22/2019    There are no preventive care reminders to display for this patient.   Lab Results  Component Value Date   TSH 1.920 10/01/2015   Lab Results  Component Value Date   WBC 6.4 12/12/2020   HGB 14.4 12/12/2020  HCT 41.9 12/12/2020   MCV 89 12/12/2020   PLT 144 (L) 12/12/2020   Lab Results  Component Value Date   NA 141 12/12/2020   K 5.8 (H) 12/12/2020   CO2 23 12/12/2020   GLUCOSE 99 12/12/2020   BUN 22 12/12/2020   CREATININE 1.24 12/12/2020   BILITOT 0.3 12/12/2020   ALKPHOS 76 12/12/2020   AST 32 12/12/2020   ALT 13 12/12/2020   PROT 6.8 12/12/2020   ALBUMIN 4.5 12/12/2020   CALCIUM 9.9 12/12/2020   ANIONGAP 10 09/05/2018   EGFR 60 12/12/2020   Lab Results  Component Value Date   CHOL 154 12/12/2020   Lab Results  Component Value Date   HDL 56 12/12/2020   Lab Results  Component Value Date   LDLCALC 78 12/12/2020   Lab Results  Component Value Date   TRIG 113 12/12/2020   Lab Results  Component Value Date   CHOLHDL 2.8 12/12/2020   Lab Results  Component Value Date   HGBA1C 5.0 09/22/2017       Assessment & Plan:   Lukus was seen today for cellulitis.  Diagnoses and all orders for this visit:  Cellulitis of upper extremity, unspecified laterality Keflex ordered as below. Basic wound care. Return to office for new or worsening symptoms, or if symptoms persist.  -     cephALEXin (KEFLEX) 500 MG capsule; Take 1 capsule (500 mg total) by mouth 2 (two) times daily for 10 days.    Return if symptoms worsen or fail to improve.  The patient indicates understanding of these issues and agrees with the plan.  Gwenlyn Perking, FNP

## 2021-03-17 NOTE — Patient Instructions (Signed)
Cellulitis, Adult Cellulitis is a skin infection. The infected area is usually warm, red, swollen, and tender. This condition occurs most often in the arms and lower legs. The infection can travel to the muscles, blood, and underlying tissue and become serious. It is very important to get treated for this condition. What are the causes? Cellulitis is caused by bacteria. The bacteria enter through a break in the skin, such as a cut, burn, insect bite, open sore, or crack. What increases the risk? This condition is more likely to occur in people who: Have a weak body defense system (immune system). Have open wounds on the skin, such as cuts, burns, bites, and scrapes. Bacteria can enter the body through these open wounds. Are older than 79 years of age. Have diabetes. Have a type of long-lasting (chronic) liver disease (cirrhosis) or kidney disease. Are obese. Have a skin condition such as: Itchy rash (eczema). Slow movement of blood in the veins (venous stasis). Fluid buildup below the skin (edema). Have had radiation therapy. Use IV drugs. What are the signs or symptoms? Symptoms of this condition include: Redness, streaking, or spotting on the skin. Swollen area of the skin. Tenderness or pain when an area of the skin is touched. Warm skin. A fever. Chills. Blisters. How is this diagnosed? This condition is diagnosed based on a medical history and physical exam. You may also have tests, including: Blood tests. Imaging tests. How is this treated? Treatment for this condition may include: Medicines, such as antibiotic medicines or medicines to treat allergies (antihistamines). Supportive care, such as rest and application of cold or warm cloths (compresses) to the skin. Hospital care, if the condition is severe. The infection usually starts to get better within 1-2 days of treatment. Follow these instructions at home: Medicines Take over-the-counter and prescription medicines  only as told by your health care provider. If you were prescribed an antibiotic medicine, take it as told by your health care provider. Do not stop taking the antibiotic even if you start to feel better. General instructions Drink enough fluid to keep your urine pale yellow. Do not touch or rub the infected area. Raise (elevate) the infected area above the level of your heart while you are sitting or lying down. Apply warm or cold compresses to the affected area as told by your health care provider. Keep all follow-up visits as told by your health care provider. This is important. These visits let your health care provider make sure a more serious infection is not developing. Contact a health care provider if: You have a fever. Your symptoms do not begin to improve within 1-2 days of starting treatment. Your bone or joint underneath the infected area becomes painful after the skin has healed. Your infection returns in the same area or another area. You notice a swollen bump in the infected area. You develop new symptoms. You have a general ill feeling (malaise) with muscle aches and pains. Get help right away if: Your symptoms get worse. You feel very sleepy. You develop vomiting or diarrhea that persists. You notice red streaks coming from the infected area. Your red area gets larger or turns dark in color. These symptoms may represent a serious problem that is an emergency. Do not wait to see if the symptoms will go away. Get medical help right away. Call your local emergency services (911 in the [place] Do not drive yourself to the hospital. Summary Cellulitis is a skin infection. This condition occurs most often in  the arms and lower legs. Treatment for this condition may include medicines, such as antibiotic medicines or antihistamines. Take over-the-counter and prescription medicines only as told by your health care provider. If you were prescribed an antibiotic medicine, do not stop  taking the antibiotic even if you start to feel better. Contact a health care provider if your symptoms do not begin to improve within 1-2 days of starting treatment or your symptoms get worse. Keep all follow-up visits as told by your health care provider. This is important. These visits let your health care provider make sure that a more serious infection is not developing. This information is not intended to replace advice given to you by your health care provider. Make sure you discuss any questions you have with your health care provider. Document Revised: 04/17/2019 Document Reviewed: 08/26/2017 Elsevier Patient Education  2022 [employer]  Wound Care, Adult Taking care of your wound properly can help to prevent pain, infection, and scarring. It can also help your wound heal more quickly. Follow instructions from your health care provider about how to care for your wound. Supplies needed: Soap and water. Wound cleanser, saline, or germ-free (sterile) water. Gauze. If needed, a clean bandage (dressing) or other type of wound dressing material to cover or place in the wound. Follow your health care provider's instructions about what dressing supplies to use. Cream or topical ointment to apply to the wound, if told by your health care provider. How to care for your wound Cleaning the wound Ask your health care provider how to clean the wound. This may include: Using mild soap and water, a wound cleanser, saline, or sterile water. Using a clean gauze to pat the wound dry after cleaning it. Do not rub or scrub the wound. Dressing care Wash your hands with soap and water for at least 20 seconds before and after you change the dressing. If soap and water are not available, use hand sanitizer. Change your dressing as told by your health care provider. This may include: Cleaning or rinsing out (irrigating) the wound. Application of cream or topical ointment, if told by your health care  provider. Placing a dressing over the wound or in the wound (packing). Covering the wound with an outer dressing. Leave stitches (sutures), staples, skin glue, or adhesive strips in place. These skin closures may need to stay in place for 2 weeks or longer. If adhesive strip edges start to loosen and curl up, you may trim the loose edges. Do not remove adhesive strips completely unless your health care provider tells you to do that. Ask your health care provider when you can leave the wound uncovered. Checking for infection Check your wound area every day for signs of infection. Check for: More redness, swelling, or pain. Fluid or blood. Warmth. Pus or a bad smell.  Follow these instructions at home Medicines If you were prescribed an antibiotic medicine, cream, or ointment, take or apply it as told by your health care provider. Do not stop using the antibiotic even if your condition improves. If you were prescribed pain medicine, take it 30 minutes before you do any wound care or as told by your health care provider. Take over-the-counter and prescription medicines only as told by your health care provider. Eating and drinking Eat a diet that includes protein, vitamin A, vitamin C, and other nutrient-rich foods to help the wound heal. Foods rich in protein include meat, fish, eggs, dairy, beans, and nuts. Foods rich in vitamin A  include carrots and dark green, leafy vegetables. Foods rich in vitamin C include citrus fruits, tomatoes, broccoli, and peppers. Drink enough fluid to keep your urine pale yellow. General instructions Do not take baths, swim, or use a hot tub until your health care provider approves. Ask your health care provider if you may take showers. You may only be allowed to take sponge baths. Do not scratch or pick at the wound. Keep it covered as told by your health care provider. Return to your normal activities as told by your health care provider. Ask your health care  provider what activities are safe for you. Protect your wound from the sun when you are outside for the first 6 months, or for as long as told by your health care provider. Cover up the scar area or apply sunscreen that has an SPF of at least 30. Do not use any products that contain nicotine or tobacco. These products include cigarettes, chewing tobacco, and vaping devices, such as e-cigarettes. If you need help quitting, ask your health care provider. Keep all follow-up visits. This is important. Contact a health care provider if: You received a tetanus shot and you have swelling, severe pain, redness, or bleeding at the injection site. Your pain is not controlled with medicine. You have any of these signs of infection: More redness, swelling, or pain around the wound. Fluid or blood coming from the wound. Warmth coming from the wound. A fever or chills. You are nauseous or you vomit. You are dizzy. You have a new rash or hardness around the wound. Get help right away if: You have a red streak of skin near the area around your wound. Pus or a bad smell coming from the wound. Your wound has been closed with staples, sutures, skin glue, or adhesive strips and it begins to open up and separate. Your wound is bleeding, and the bleeding does not stop with gentle pressure. These symptoms may represent a serious problem that is an emergency. Do not wait to see if the symptoms will go away. Get medical help right away. Call your local emergency services (911 in the [place] Do not drive yourself to the hospital. Summary Always wash your hands with soap and water for at least 20 seconds before and after changing your dressing. Change your dressing as told by your health care provider. To help with healing, eat foods that are rich in protein, vitamin A, vitamin C, and other nutrients. Check your wound every day for signs of infection. Contact your health care provider if you think that your wound is  infected. This information is not intended to replace advice given to you by your health care provider. Make sure you discuss any questions you have with your health care provider. Document Revised: 08/13/2020 Document Reviewed: 08/13/2020 Elsevier Patient Education  2022 [employer].

## 2021-03-18 ENCOUNTER — Telehealth: Payer: Self-pay | Admitting: Family Medicine

## 2021-03-18 NOTE — Telephone Encounter (Signed)
Med was located at the home

## 2021-03-18 NOTE — Telephone Encounter (Signed)
  Prescription Request  03/18/2021  Is this a "Controlled Substance" medicine? no  Have you seen your PCP in the last 2 weeks? no  If YES, route message to pool  -  If NO, patient needs to be scheduled for appointment.  What is the name of the medication or equipment? cephALEXin (KEFLEX) 500 MG capsule  Have you contacted your pharmacy to request a refill? yes   Which pharmacy would you like this sent to? [facility] - [place], [place] - [address]   Patient notified that their request is being sent to the clinical staff for review and that they should receive a response within 2 business days.

## 2021-03-20 DIAGNOSIS — F319 Bipolar disorder, unspecified: Secondary | ICD-10-CM | POA: Diagnosis not present

## 2021-04-08 ENCOUNTER — Other Ambulatory Visit: Payer: Self-pay | Admitting: Family Medicine

## 2021-04-08 DIAGNOSIS — F424 Excoriation (skin-picking) disorder: Secondary | ICD-10-CM

## 2021-04-08 DIAGNOSIS — L209 Atopic dermatitis, unspecified: Secondary | ICD-10-CM

## 2021-05-05 ENCOUNTER — Ambulatory Visit (INDEPENDENT_AMBULATORY_CARE_PROVIDER_SITE_OTHER): Payer: Medicare Other | Admitting: Family Medicine

## 2021-05-05 ENCOUNTER — Encounter: Payer: Self-pay | Admitting: Family Medicine

## 2021-05-05 VITALS — BP 157/83 | HR 56 | Ht 66.0 in | Wt 140.0 lb

## 2021-05-05 DIAGNOSIS — N182 Chronic kidney disease, stage 2 (mild): Secondary | ICD-10-CM | POA: Diagnosis not present

## 2021-05-05 DIAGNOSIS — I10 Essential (primary) hypertension: Secondary | ICD-10-CM | POA: Diagnosis not present

## 2021-05-05 DIAGNOSIS — F981 Encopresis not due to a substance or known physiological condition: Secondary | ICD-10-CM

## 2021-05-05 DIAGNOSIS — E785 Hyperlipidemia, unspecified: Secondary | ICD-10-CM

## 2021-05-05 DIAGNOSIS — Z23 Encounter for immunization: Secondary | ICD-10-CM

## 2021-05-05 DIAGNOSIS — J449 Chronic obstructive pulmonary disease, unspecified: Secondary | ICD-10-CM | POA: Diagnosis not present

## 2021-05-05 NOTE — Addendum Note (Signed)
Addended by: Alphonzo Dublin on: 05/05/2021 01:59 PM   Modules accepted: Orders

## 2021-05-05 NOTE — Progress Notes (Signed)
BP (!) 157/83    Pulse (!) 56    Ht 5\' 6"  (1.676 m)    Wt 140 lb (63.5 kg)    SpO2 100%    BMI 22.60 kg/m    Subjective:   Patient ID: Michael Mercado, male    DOB: [birth year], 81 y.o.   MRN: [id number]  HPI: Michael Mercado is a 81 y.o. male presenting on 05/05/2021 for Medical Management of Chronic Issues, Hyperlipidemia, and Hypertension   HPI Hypertension Patient is currently on amlodipine and lisinopril, and their blood pressure today is 157/83. Patient denies any lightheadedness or dizziness. Patient denies headaches, blurred vision, chest pains, shortness of breath, or weakness. Denies any side effects from medication and is content with current medication.   Hyperlipidemia Patient is coming in for recheck of his hyperlipidemia. The patient is currently taking no medication currently and is diet controlled.. They deny any issues with myalgias or history of liver damage from it. They deny any focal numbness or weakness or chest pain.   COPD Patient is coming in for COPD recheck today.  He is currently on Breo.  He has a mild chronic cough but denies any major coughing spells or wheezing spells.  He has 1 nighttime symptoms per week and 1 daytime symptoms per week currently.   Ckd 2 Patient is coming in for recheck for CKD.  Denies any urinary symptoms.  Patient is having stool incontinence occasionally once or twice a day, likely due to his intellectual disability and schizophrenia.  They just want incontinence pads.  Relevant past medical, surgical, family and social history reviewed and updated as indicated. Interim medical history since our last visit reviewed. Allergies and medications reviewed and updated.  Review of Systems  Constitutional:  Negative for chills and fever.  Eyes:  Negative for visual disturbance.  Respiratory:  Negative for shortness of breath and wheezing.   Cardiovascular:  Negative for chest pain and leg swelling.  Musculoskeletal:  Negative  for back pain and gait problem.  Skin:  Negative for rash.  Neurological:  Negative for dizziness, weakness and light-headedness.  All other systems reviewed and are negative.  Per HPI unless specifically indicated above   Allergies as of 05/05/2021   No Known Allergies      Medication List        Accurate as of May 05, 2021  1:49 PM. If you have any questions, ask your nurse or doctor.          STOP taking these medications    chlorthalidone 25 MG tablet Commonly known as: HYGROTON Stopped by: Elige Radon Cristle Jared, MD   levETIRAcetam 500 MG tablet Commonly known as: KEPPRA Stopped by: Elige Radon Delancey Moraes, MD       TAKE these medications    Acetaminophen Extra Strength 500 MG tablet Generic drug: acetaminophen TAKE 2 TABS BY MOUTH EVERY 4 HRS AS NEEDED FOR HEADACHE/MILD TO MODERATE PAIN OR TEMP OF 100.3 & ABOVE IF TEMP UNRESOLVED AFTER 24 HRS MUSTCALL NURSE TO FOLLOW UP W/ MD.   amLODipine 5 MG tablet Commonly known as: NORVASC TAKE (1) TABLET BY MOUTH ONCE DAILY.   ARIPiprazole 2 MG tablet Commonly known as: ABILIFY Take 2 mg by mouth daily.   Banophen 25 mg capsule Generic drug: diphenhydrAMINE TAKE 1 CAPSULE BY MOUTH DAILY AS NEEDED FOR ALLERGY SYMPTOMS. CONTACT NURSE IF SYMPTOMS WORSEN.   Breo Ellipta 100-25 MCG/ACT Aepb Generic drug: fluticasone furoate-vilanterol INHALE 1 PUFF ONCE DAILY.   cetirizine 10  MG tablet Commonly known as: ZYRTEC TAKE (1) TABLET BY MOUTH ONCE DAILY.   Desitin Oint Apply 1 application topically 4 (four) times daily as needed.   divalproex 250 MG DR tablet Commonly known as: DEPAKOTE Take by mouth.   FeroSul 325 (65 FE) MG tablet Generic drug: ferrous sulfate Take 325 mg by mouth 2 (two) times daily.   lisinopril 20 MG tablet Commonly known as: ZESTRIL TAKE (1) TABLET BY MOUTH ONCE DAILY.   nicotine 7 mg/24hr patch Commonly known as: NICODERM CQ - dosed in mg/24 hr PLACE 1 PATCH ONTO SKIN ONCE DAILY. START  7MG  ONCE 14MG  IS USED.   pantoprazole 40 MG tablet Commonly known as: PROTONIX Take 1 tablet (40 mg total) by mouth daily.   PARoxetine 20 MG tablet Commonly known as: PAXIL Take 1 tablet (20 mg total) by mouth at bedtime.   PeriGuard Oint Apply topically.   triamcinolone cream 0.1 % Commonly known as: KENALOG APPLY TOPICALLY TO AFFECTED AREA(S) TWICE DAILY.               Durable Medical Equipment  (From admission, onward)           Start     Ordered   05/05/21 0000  For home use only DME Other see comment       Comments: Depends- Large And gloves size large and wet wipes, Given now for at least 4/day for a 90-day supply with 3 refills  Question:  Length of Need  Answer:  Lifetime   05/05/21 1341             Objective:   BP (!) 157/83    Pulse (!) 56    Ht 5\' 6"  (1.676 m)    Wt 140 lb (63.5 kg)    SpO2 100%    BMI 22.60 kg/m   Wt Readings from Last 3 Encounters:  05/05/21 140 lb (63.5 kg)  03/17/21 140 lb (63.5 kg)  12/12/20 136 lb (61.7 kg)    Physical Exam Vitals and nursing note reviewed.  Constitutional:      General: He is not in acute distress.    Appearance: He is well-developed. He is not diaphoretic.  Eyes:     General: No scleral icterus.    Conjunctiva/sclera: Conjunctivae normal.  Neck:     Thyroid: No thyromegaly.  Cardiovascular:     Rate and Rhythm: Normal rate and regular rhythm.     Heart sounds: Normal heart sounds. No murmur heard. Pulmonary:     Effort: Pulmonary effort is normal. No respiratory distress.     Breath sounds: Normal breath sounds. No wheezing.  Musculoskeletal:        General: Normal range of motion.     Cervical back: Neck supple.  Lymphadenopathy:     Cervical: No cervical adenopathy.  Skin:    General: Skin is warm and dry.     Findings: No rash.  Neurological:     Mental Status: He is alert and oriented to person, place, and time.     Coordination: Coordination normal.  Psychiatric:         Behavior: Behavior normal.      Assessment & Plan:   Problem List Items Addressed This Visit       Cardiovascular and Mediastinum   HTN (hypertension), benign     Respiratory   COPD (chronic obstructive pulmonary disease) ([place])     Genitourinary   CKD (chronic kidney disease), stage II - Primary  Other   Hyperlipidemia LDL goal <130   Other Visit Diagnoses     Psychogenic fecal incontinence       Relevant Orders   For home use only DME Other see comment   Need for shingles vaccine       Relevant Orders   Varicella-zoster vaccine IM (Shingrix) (Completed)     Current medicine.  No changes for now.  It does seem like they stopped a couple of his medicines and is having some mild tremors, discussed with his psychiatry.  Follow up plan: Return in about 6 months (around 11/02/2021), or if symptoms worsen or fail to improve, for Hypertension and Copd.  Counseling provided for all of the vaccine components Orders Placed This Encounter  Procedures   For home use only DME Other see comment   Varicella-zoster vaccine IM (Shingrix)    Caryl Pina, MD [facility] 05/05/2021, 1:49 PM

## 2021-05-06 LAB — CMP14+EGFR
ALT: 13 IU/L (ref 0–44)
AST: 20 IU/L (ref 0–40)
Albumin/Globulin Ratio: 1.9 (ref 1.2–2.2)
Albumin: 3.9 g/dL (ref 3.7–4.7)
Alkaline Phosphatase: 65 IU/L (ref 44–121)
BUN/Creatinine Ratio: 17 (ref 10–24)
BUN: 19 mg/dL (ref 8–27)
Bilirubin Total: 0.4 mg/dL (ref 0.0–1.2)
CO2: 27 mmol/L (ref 20–29)
Calcium: 9.3 mg/dL (ref 8.6–10.2)
Chloride: 103 mmol/L (ref 96–106)
Creatinine, Ser: 1.15 mg/dL (ref 0.76–1.27)
Globulin, Total: 2.1 g/dL (ref 1.5–4.5)
Glucose: 73 mg/dL (ref 70–99)
Potassium: 5.2 mmol/L (ref 3.5–5.2)
Sodium: 142 mmol/L (ref 134–144)
Total Protein: 6 g/dL (ref 6.0–8.5)
eGFR: 65 mL/min/{1.73_m2} (ref >59)

## 2021-05-06 LAB — CBC WITH DIFFERENTIAL/PLATELET
Basophils Absolute: 0 10*3/uL (ref 0.0–0.2)
Basos: 1 %
EOS (ABSOLUTE): 0.1 10*3/uL (ref 0.0–0.4)
Eos: 3 %
Hematocrit: 41.1 % (ref 37.5–51.0)
Hemoglobin: 13.8 g/dL (ref 13.0–17.7)
Immature Grans (Abs): 0 10*3/uL (ref 0.0–0.1)
Immature Granulocytes: 1 %
Lymphocytes Absolute: 1.8 10*3/uL (ref 0.7–3.1)
Lymphs: 34 %
MCH: 31 pg (ref 26.6–33.0)
MCHC: 33.6 g/dL (ref 31.5–35.7)
MCV: 92 fL (ref 79–97)
Monocytes Absolute: 0.4 10*3/uL (ref 0.1–0.9)
Monocytes: 8 %
Neutrophils Absolute: 2.9 10*3/uL (ref 1.4–7.0)
Neutrophils: 53 %
Platelets: 133 10*3/uL — ABNORMAL LOW (ref 150–450)
RBC: 4.45 x10E6/uL (ref 4.14–5.80)
RDW: 13.6 % (ref 11.6–15.4)
WBC: 5.3 10*3/uL (ref 3.4–10.8)

## 2021-05-06 LAB — LIPID PANEL
Chol/HDL Ratio: 2.7 ratio (ref 0.0–5.0)
Cholesterol, Total: 140 mg/dL (ref 100–199)
HDL: 51 mg/dL (ref >39)
LDL Chol Calc (NIH): 77 mg/dL (ref 0–99)
Triglycerides: 59 mg/dL (ref 0–149)
VLDL Cholesterol Cal: 12 mg/dL (ref 5–40)

## 2021-05-13 ENCOUNTER — Other Ambulatory Visit: Payer: Self-pay | Admitting: Family Medicine

## 2021-05-13 DIAGNOSIS — I10 Essential (primary) hypertension: Secondary | ICD-10-CM

## 2021-06-02 DIAGNOSIS — I952 Hypotension due to drugs: Secondary | ICD-10-CM | POA: Diagnosis not present

## 2021-06-02 DIAGNOSIS — I129 Hypertensive chronic kidney disease with stage 1 through stage 4 chronic kidney disease, or unspecified chronic kidney disease: Secondary | ICD-10-CM | POA: Diagnosis not present

## 2021-06-02 DIAGNOSIS — Z79899 Other long term (current) drug therapy: Secondary | ICD-10-CM | POA: Diagnosis not present

## 2021-06-02 DIAGNOSIS — N1831 Chronic kidney disease, stage 3a: Secondary | ICD-10-CM | POA: Diagnosis not present

## 2021-06-02 DIAGNOSIS — R296 Repeated falls: Secondary | ICD-10-CM | POA: Diagnosis not present

## 2021-06-02 DIAGNOSIS — D508 Other iron deficiency anemias: Secondary | ICD-10-CM | POA: Diagnosis not present

## 2021-06-12 ENCOUNTER — Telehealth: Payer: Self-pay | Admitting: Family Medicine

## 2021-06-12 NOTE — Telephone Encounter (Signed)
Written script placed on providers desk 

## 2021-06-12 NOTE — Telephone Encounter (Signed)
°  Prescription Request  06/12/2021  Is this a "Controlled Substance" medicine? no  Have you seen your PCP in the last 2 weeks? 05/05/21  If YES, route message to pool  -  If NO, patient needs to be scheduled for appointment.  What is the name of the medication or equipment? Depends , wet wipes, and gloves size large  Have you contacted your pharmacy to request a refill? yes   Which pharmacy would you like this sent to? [facility]    Patient notified that their request is being sent to the clinical staff for review and that they should receive a response within 2 business days.

## 2021-06-16 ENCOUNTER — Encounter: Payer: Self-pay | Admitting: Family Medicine

## 2021-06-16 ENCOUNTER — Ambulatory Visit (INDEPENDENT_AMBULATORY_CARE_PROVIDER_SITE_OTHER): Payer: Medicare Other | Admitting: Family Medicine

## 2021-06-16 VITALS — BP 165/89 | HR 61 | Ht 66.0 in | Wt 145.0 lb

## 2021-06-16 DIAGNOSIS — F981 Encopresis not due to a substance or known physiological condition: Secondary | ICD-10-CM | POA: Diagnosis not present

## 2021-06-16 DIAGNOSIS — E785 Hyperlipidemia, unspecified: Secondary | ICD-10-CM | POA: Diagnosis not present

## 2021-06-16 DIAGNOSIS — J449 Chronic obstructive pulmonary disease, unspecified: Secondary | ICD-10-CM

## 2021-06-16 DIAGNOSIS — I10 Essential (primary) hypertension: Secondary | ICD-10-CM

## 2021-06-16 DIAGNOSIS — Z0000 Encounter for general adult medical examination without abnormal findings: Secondary | ICD-10-CM

## 2021-06-16 DIAGNOSIS — R159 Full incontinence of feces: Secondary | ICD-10-CM | POA: Insufficient documentation

## 2021-06-16 NOTE — Addendum Note (Signed)
Addended by: Arville Care on: 06/16/2021 03:00 PM   Modules accepted: Orders

## 2021-06-16 NOTE — Progress Notes (Addendum)
BP (!) 165/89    Pulse 61    Ht 5\' 6"  (1.676 m)    Wt 145 lb (65.8 kg)    SpO2 100%    BMI 23.40 kg/m    Subjective:   Patient ID: Michael Mercado, male    DOB: 09/23/1940, 81 y.o.   MRN: [id number]  HPI: Michael Mercado is a 81 y.o. male presenting on 06/16/2021 for Medical Management of Chronic Issues (CPE), Hyperlipidemia, and Hypertension   HPI Physical exam Patient denies any chest pain, shortness of breath, headaches or vision issues, abdominal complaints, diarrhea, nausea, vomiting, or joint issues.  Patient has a small skin tear on his left forearm that bleeds sometimes and was recently but does not appear to be infected or any erythema or drainage.  Hypertension Patient is currently on amlodipine and lisinopril, and their blood pressure today is 165/89. Patient denies any lightheadedness or dizziness. Patient denies headaches, blurred vision, chest pains, shortness of breath, or weakness. Denies any side effects from medication and is content with current medication.   Hyperlipidemia Patient is coming in for recheck of his hyperlipidemia. The patient is currently taking no medication currently diet control. They deny any issues with myalgias or history of liver damage from it. They deny any focal numbness or weakness or chest pain.   COPD Patient is coming in for COPD recheck today.  He is currently on Breo Ellipta.  He has a mild chronic cough but denies any major coughing spells or wheezing spells.  He has 0nighttime symptoms per week and 0daytime symptoms per week currently.   Relevant past medical, surgical, family and social history reviewed and updated as indicated. Interim medical history since our last visit reviewed. Allergies and medications reviewed and updated.  Review of Systems  Constitutional:  Negative for chills and fever.  Eyes:  Negative for visual disturbance.  Respiratory:  Negative for shortness of breath and wheezing.   Cardiovascular:  Negative  for chest pain and leg swelling.  Musculoskeletal:  Negative for back pain and gait problem.  Skin:  Negative for rash.  Neurological:  Negative for dizziness, weakness and light-headedness.  All other systems reviewed and are negative.  Per HPI unless specifically indicated above   Allergies as of 06/16/2021   No Known Allergies      Medication List        Accurate as of June 16, 2021  2:52 PM. If you have any questions, ask your nurse or doctor.          Acetaminophen Extra Strength 500 MG tablet Generic drug: acetaminophen TAKE 2 TABS BY MOUTH EVERY 4 HRS AS NEEDED FOR HEADACHE/MILD TO MODERATE PAIN OR TEMP OF 100.3 & ABOVE IF TEMP UNRESOLVED AFTER 24 HRS MUSTCALL NURSE TO FOLLOW UP W/ MD.   amLODipine 5 MG tablet Commonly known as: NORVASC TAKE (1) TABLET BY MOUTH ONCE DAILY.   ARIPiprazole 2 MG tablet Commonly known as: ABILIFY Take 2 mg by mouth daily.   Banophen 25 mg capsule Generic drug: diphenhydrAMINE TAKE 1 CAPSULE BY MOUTH DAILY AS NEEDED FOR ALLERGY SYMPTOMS. CONTACT NURSE IF SYMPTOMS WORSEN.   Breo Ellipta 100-25 MCG/ACT Aepb Generic drug: fluticasone furoate-vilanterol INHALE 1 PUFF ONCE DAILY.   cetirizine 10 MG tablet Commonly known as: ZYRTEC TAKE (1) TABLET BY MOUTH ONCE DAILY.   Desitin Oint Apply 1 application topically 4 (four) times daily as needed.   divalproex 250 MG DR tablet Commonly known as: DEPAKOTE Take by mouth.  FeroSul 325 (65 FE) MG tablet Generic drug: ferrous sulfate Take 325 mg by mouth 2 (two) times daily.   lisinopril 20 MG tablet Commonly known as: ZESTRIL TAKE (1) TABLET BY MOUTH ONCE DAILY.   nicotine 7 mg/24hr patch Commonly known as: NICODERM CQ - dosed in mg/24 hr PLACE 1 PATCH ONTO SKIN ONCE DAILY. START 7MG  ONCE 14MG  IS USED.   pantoprazole 40 MG tablet Commonly known as: PROTONIX TAKE (1) TABLET BY MOUTH ONCE DAILY.   PARoxetine 20 MG tablet Commonly known as: PAXIL TAKE 1 TABLET BY MOUTH AT  BEDTIME.   PeriGuard Oint Apply topically.   triamcinolone cream 0.1 % Commonly known as: KENALOG APPLY TOPICALLY TO AFFECTED AREA(S) TWICE DAILY.         Objective:   BP (!) 165/89    Pulse 61    Ht 5\' 6"  (1.676 m)    Wt 145 lb (65.8 kg)    SpO2 100%    BMI 23.40 kg/m   Wt Readings from Last 3 Encounters:  06/16/21 145 lb (65.8 kg)  05/05/21 140 lb (63.5 kg)  03/17/21 140 lb (63.5 kg)    Physical Exam Vitals and nursing note reviewed.  Constitutional:      General: He is not in acute distress.    Appearance: He is well-developed. He is not diaphoretic.  Eyes:     General: No scleral icterus.    Conjunctiva/sclera: Conjunctivae normal.  Neck:     Thyroid: No thyromegaly.  Cardiovascular:     Rate and Rhythm: Normal rate and regular rhythm.     Heart sounds: Normal heart sounds. No murmur heard. Pulmonary:     Effort: Pulmonary effort is normal. No respiratory distress.     Breath sounds: Normal breath sounds. No wheezing.  Musculoskeletal:        General: Normal range of motion.     Cervical back: Neck supple.  Lymphadenopathy:     Cervical: No cervical adenopathy.  Skin:    General: Skin is warm and dry.     Findings: Lesion (Skin tear, left forearm, no erythema.) present. No rash.  Neurological:     Mental Status: He is alert and oriented to person, place, and time.     Coordination: Coordination normal.  Psychiatric:        Behavior: Behavior normal.      Assessment & Plan:   Problem List Items Addressed This Visit       Cardiovascular and Mediastinum   HTN (hypertension), benign     Respiratory   COPD (chronic obstructive pulmonary disease) ([place])     Other   Hyperlipidemia LDL goal <130   Stool incontinence   Relevant Orders   For home use only DME Other see comment   Other Visit Diagnoses     Physical exam    -  Primary       Patient seems to be doing well, just had blood work 1 month ago.  We will keep a close eye on blood  pressures  Patient having stool incontinence and needs depends.  Likely psychogenic in nature.  Also needs gloves. Follow up plan: Return in about 6 months (around 12/14/2021), or if symptoms worsen or fail to improve, for Hypertension and cholesterol visit.  Counseling provided for all of the vaccine components No orders of the defined types were placed in this encounter.   Caryl Pina, MD [facility] 06/16/2021, 2:52 PM

## 2021-06-16 NOTE — Telephone Encounter (Signed)
Faxed to [facility] 

## 2021-06-18 ENCOUNTER — Telehealth: Payer: Self-pay | Admitting: Family Medicine

## 2021-06-18 DIAGNOSIS — L03114 Cellulitis of left upper limb: Secondary | ICD-10-CM

## 2021-06-18 MED ORDER — CEPHALEXIN 500 MG PO CAPS: 500.0000 mg | ORAL_CAPSULE | Freq: Four times a day (QID) | ORAL | 0 refills | Status: DC

## 2021-06-18 NOTE — Telephone Encounter (Signed)
Angela informed

## 2021-06-18 NOTE — Telephone Encounter (Signed)
Sent Keflex for the patient ?

## 2021-07-09 DIAGNOSIS — I129 Hypertensive chronic kidney disease with stage 1 through stage 4 chronic kidney disease, or unspecified chronic kidney disease: Secondary | ICD-10-CM | POA: Diagnosis not present

## 2021-07-09 DIAGNOSIS — D696 Thrombocytopenia, unspecified: Secondary | ICD-10-CM | POA: Diagnosis not present

## 2021-07-09 DIAGNOSIS — N182 Chronic kidney disease, stage 2 (mild): Secondary | ICD-10-CM | POA: Diagnosis not present

## 2021-07-14 DIAGNOSIS — R233 Spontaneous ecchymoses: Secondary | ICD-10-CM | POA: Diagnosis not present

## 2021-07-14 DIAGNOSIS — Z7689 Persons encountering health services in other specified circumstances: Secondary | ICD-10-CM | POA: Diagnosis not present

## 2021-07-14 DIAGNOSIS — G40909 Epilepsy, unspecified, not intractable, without status epilepticus: Secondary | ICD-10-CM | POA: Diagnosis not present

## 2021-07-14 DIAGNOSIS — Z6822 Body mass index (BMI) 22.0-22.9, adult: Secondary | ICD-10-CM | POA: Diagnosis not present

## 2021-07-14 DIAGNOSIS — Z125 Encounter for screening for malignant neoplasm of prostate: Secondary | ICD-10-CM | POA: Diagnosis not present

## 2021-07-14 DIAGNOSIS — F39 Unspecified mood [affective] disorder: Secondary | ICD-10-CM | POA: Diagnosis not present

## 2021-07-14 DIAGNOSIS — Z79899 Other long term (current) drug therapy: Secondary | ICD-10-CM | POA: Diagnosis not present

## 2021-07-14 DIAGNOSIS — I10 Essential (primary) hypertension: Secondary | ICD-10-CM | POA: Diagnosis not present

## 2021-07-14 DIAGNOSIS — F79 Unspecified intellectual disabilities: Secondary | ICD-10-CM | POA: Diagnosis not present

## 2021-07-15 DIAGNOSIS — Z20822 Contact with and (suspected) exposure to covid-19: Secondary | ICD-10-CM | POA: Diagnosis not present

## 2021-07-15 DIAGNOSIS — F319 Bipolar disorder, unspecified: Secondary | ICD-10-CM | POA: Diagnosis not present

## 2021-07-22 ENCOUNTER — Ambulatory Visit: Payer: Medicare Other

## 2021-08-06 ENCOUNTER — Other Ambulatory Visit: Payer: Self-pay | Admitting: Family Medicine

## 2021-08-13 DIAGNOSIS — F319 Bipolar disorder, unspecified: Secondary | ICD-10-CM | POA: Diagnosis not present

## 2021-08-16 DIAGNOSIS — Z20822 Contact with and (suspected) exposure to covid-19: Secondary | ICD-10-CM | POA: Diagnosis not present

## 2021-11-03 ENCOUNTER — Ambulatory Visit: Payer: Medicare Other | Admitting: Family Medicine

## 2022-03-16 ENCOUNTER — Encounter: Payer: Self-pay | Admitting: Neurology

## 2022-03-16 ENCOUNTER — Telehealth: Payer: Self-pay | Admitting: Neurology

## 2022-03-16 ENCOUNTER — Ambulatory Visit (INDEPENDENT_AMBULATORY_CARE_PROVIDER_SITE_OTHER): Payer: Medicare Other | Admitting: Neurology

## 2022-03-16 VITALS — BP 136/79 | HR 58 | Ht 66.0 in | Wt 147.5 lb

## 2022-03-16 DIAGNOSIS — G20C Parkinsonism, unspecified: Secondary | ICD-10-CM

## 2022-03-16 DIAGNOSIS — I639 Cerebral infarction, unspecified: Secondary | ICD-10-CM | POA: Diagnosis not present

## 2022-03-16 MED ORDER — CARBIDOPA-LEVODOPA 25-100 MG PO TABS: 1.0000 | ORAL_TABLET | Freq: Three times a day (TID) | ORAL | 3 refills | Status: DC

## 2022-03-16 NOTE — Progress Notes (Signed)
[facility]  PATIENT: Michael Mercado DOB: 09/22/1940  REQUESTING CLINICIAN: Waldon ReiningBrowning, Douglas, MD HISTORY FROM: Guardian and staff  REASON FOR VISIT: Multiple falls/Seizure   HISTORICAL  CHIEF COMPLAINT:  Chief Complaint  Patient presents with   New Patient (Initial Visit)    Rm 13. Accompanied by staff and guardian. NP Paper referral for repeated falls, seizure disorder.    HISTORY OF PRESENT ILLNESS:  This is a 81 year old gentleman with multiple medical conditions including cognitive changes, Intellectual disability, balance problem, history of subdural hematoma,questionable seizures who is presenting to establish care.  Patient was previously seen by Dr. Gerilyn Pilgrimoonquah but since he retired now patient needs a new neurologist.  Today, he presented with a staff member named Michael Mercado and his guardian named Michael Mercado.  When he comes to the seizures, both staff member and guardian has reported patient has been living with them for the past 5 years and has not have any seizure they have not witnessed any seizures.  Currently he is on Depakote 250 mg twice daily and this is for mood. They do report tremor and multiple falls.  For his mood problem and psychiatric illness he is on Abilify.  But on chart review he has noted that his Levophed has been decreased from 5 mg to 2 mg daily.  He does have slowness of his movement, abnormal gait and tremor on exam.    OTHER MEDICAL CONDITIONS: Cognitive changes, Intellectual disability, ?seizures, balance problem, history of subdural hematoma and multiple falls   REVIEW OF SYSTEMS: Full 14 system review of systems performed and negative with exception of: Unable to fully obtain   ALLERGIES: No Known Allergies  HOME MEDICATIONS: Outpatient Medications Prior to Visit  Medication Sig Dispense Refill   ACETAMINOPHEN EXTRA STRENGTH 500 MG tablet TAKE 2 TABS BY MOUTH EVERY 4 HRS AS NEEDED FOR HEADACHE/MILD TO MODERATE PAIN OR  TEMP OF 100.3 & ABOVE IF TEMP UNRESOLVED AFTER 24 HRS MUSTCALL NURSE TO FOLLOW UP W/ MD. 30 tablet 11   amLODipine (NORVASC) 5 MG tablet TAKE (1) TABLET BY MOUTH ONCE DAILY. 25 tablet 11   ARIPiprazole (ABILIFY) 2 MG tablet Take 2 mg by mouth daily.     BANOPHEN 25 MG capsule TAKE 1 CAPSULE BY MOUTH DAILY AS NEEDED FOR ALLERGY SYMPTOMS. CONTACT NURSE IF SYMPTOMS WORSEN. 30 capsule 11   BREO ELLIPTA 100-25 MCG/INH AEPB INHALE 1 PUFF ONCE DAILY. 60 each 11   cephALEXin (KEFLEX) 500 MG capsule Take 1 capsule (500 mg total) by mouth 4 (four) times daily. 28 capsule 0   cetirizine (ZYRTEC) 10 MG tablet TAKE (1) TABLET BY MOUTH ONCE DAILY. 1 tablet 11   Diaper Rash Products (DESITIN) OINT Apply 1 application topically 4 (four) times daily as needed. 99 g 3   divalproex (DEPAKOTE) 250 MG DR tablet Take by mouth.     FEROSUL 325 (65 Fe) MG tablet Take 325 mg by mouth 2 (two) times daily.     lisinopril (ZESTRIL) 20 MG tablet TAKE (1) TABLET BY MOUTH ONCE DAILY. 25 tablet 11   nicotine (NICODERM CQ - DOSED IN MG/24 HR) 7 mg/24hr patch PLACE 1 PATCH ONTO SKIN ONCE DAILY. START 7MG  ONCE 14MG  IS USED. 28 patch 11   pantoprazole (PROTONIX) 40 MG tablet TAKE (1) TABLET BY MOUTH ONCE DAILY. 25 tablet 11   PARoxetine (PAXIL) 20 MG tablet TAKE 1 TABLET BY MOUTH AT BEDTIME. 24 tablet 11   Skin Protectants, Misc. (PERIGUARD) OINT Apply topically.  triamcinolone cream (KENALOG) 0.1 % APPLY TOPICALLY TO AFFECTED AREA(S) TWICE DAILY. 80 g 4   No facility-administered medications prior to visit.    PAST MEDICAL HISTORY: Past Medical History:  Diagnosis Date   Chronic kidney disease    Hypercholesteremia    Hypertension    Moderate intellectual disability    Mood disorder (HCC)    MR (mental retardation)    Obesity     PAST SURGICAL HISTORY: Past Surgical History:  Procedure Laterality Date   CRANIOTOMY Left 07/07/2018   Procedure: CRANIOTOMY HEMATOMA EVACUATION SUBDURAL;  Surgeon: Tia Alert, MD;   Location: [facility] OR;  Service: Neurosurgery;  Laterality: Left;   CRANIOTOMY Left 07/13/2018   Procedure: Redo Left CRANIOTOMY FOR SUBDURAL HEMATOMA;  Surgeon: Tia Alert, MD;  Location: [facility] OR;  Service: Neurosurgery;  Laterality: Left;    FAMILY HISTORY: Family History  Problem Relation Age of Onset   Stroke Sister     SOCIAL HISTORY: Social History   Socioeconomic History   Marital status: Single    Spouse name: Not on file   Number of children: Not on file   Years of education: Not on file   Highest education level: Not on file  Occupational History   Occupation: disabled  Tobacco Use   Smoking status: Former    Packs/day: 0.33    Types: Cigarettes    Quit date: 09/02/2018    Years since quitting: 3.5   Smokeless tobacco: Never  Vaping Use   Vaping Use: Never used  Substance and Sexual Activity   Alcohol use: No    Alcohol/week: 0.0 standard drinks of alcohol   Drug use: No   Sexual activity: Never  Other Topics Concern   Not on file  Social History Narrative   Living in [facility]   Social Determinants of Health   Financial Resource Strain: Low Risk  (07/19/2020)   Overall Financial Resource Strain (CARDIA)    Difficulty of Paying Living Expenses: Not hard at all  Food Insecurity: No Food Insecurity (07/19/2020)   Hunger Vital Sign    Worried About Running Out of Food in the Last Year: Never true    Ran Out of Food in the Last Year: Never true  Transportation Needs: No Transportation Needs (07/19/2020)   PRAPARE - Administrator, Civil Service (Medical): No    Lack of Transportation (Non-Medical): No  Physical Activity: Sufficiently Active (07/19/2020)   Exercise Vital Sign    Days of Exercise per Week: 7 days    Minutes of Exercise per Session: 30 min  Stress: No Stress Concern Present (07/19/2020)   [employer] of Occupational Health - Occupational Stress Questionnaire    Feeling of Stress : Only a little  Social Connections: Moderately  Isolated (07/19/2020)   Social Connection and Isolation Panel [NHANES]    Frequency of Communication with Friends and Family: Twice a week    Frequency of Social Gatherings with Friends and Family: Once a week    Attends Religious Services: Never    Database administrator or Organizations: Yes    Attends Engineer, structural: More than 4 times per year    Marital Status: Never married  Intimate Partner Violence: Not At Risk (07/19/2020)   Humiliation, Afraid, Rape, and Kick questionnaire    Fear of Current or Ex-Partner: No    Emotionally Abused: No    Physically Abused: No    Sexually Abused: No    PHYSICAL EXAM  GENERAL EXAM/CONSTITUTIONAL: Vitals:  Vitals:   03/16/22 0953  BP: 136/79  Pulse: (!) 58  Weight: 147 lb 8 oz (66.9 kg)  Height: 5\' 6"  (1.676 m)   Body mass index is 23.81 kg/m. Wt Readings from Last 3 Encounters:  03/16/22 147 lb 8 oz (66.9 kg)  06/16/21 145 lb (65.8 kg)  05/05/21 140 lb (63.5 kg)   Patient is in no distress; well developed, nourished and groomed; neck is supple  EYES: Visual fields full to confrontation, Extraocular movements intacts,  No results found.  MUSCULOSKELETAL: Gait, strength, tone, movements noted in Neurologic exam below  NEUROLOGIC: MENTAL STATUS:     05/10/2018   12:43 PM 09/23/2016    3:07 PM  MMSE - Mini Mental State Exam  Not completed: Unable to complete Unable to complete   awake, alert, oriented to person 2nd, 3rd, 4th, 6th - able to track examiner, and count fingers, 5th - decrease facial features 7th - facial strength symmetric 8th - hearing intact 9th - palate elevates symmetrically, uvula midline 11th - shoulder shrug symmetric 12th - tongue protrusion midline He has a positive glabellar reflex   MOTOR:  normal bulk, full strength in the BUE, BLE, there is bradykinesia and increase tone  SENSORY:  normal and symmetric to light touch  COORDINATION:  He does have resting tremors, difficulty with  fine movement, dysdiadochokinesia is present, difficulty with fine finger movements normal  GAIT/STATION:  Difficulty with standing up but able to do so unassisted, wide based, shuffling gait. Turn en bloc and positive pull test.     DIAGNOSTIC DATA (LABS, IMAGING, TESTING) - I reviewed patient records, labs, notes, testing and imaging myself where available.  Lab Results  Component Value Date   WBC 5.3 05/05/2021   HGB 13.8 05/05/2021   HCT 41.1 05/05/2021   MCV 92 05/05/2021   PLT 133 (L) 05/05/2021      Component Value Date/Time   NA 142 05/05/2021 1428   K 5.2 05/05/2021 1428   CL 103 05/05/2021 1428   CO2 27 05/05/2021 1428   GLUCOSE 73 05/05/2021 1428   GLUCOSE 150 (H) 09/05/2018 1446   BUN 19 05/05/2021 1428   CREATININE 1.15 05/05/2021 1428   CALCIUM 9.3 05/05/2021 1428   PROT 6.0 05/05/2021 1428   ALBUMIN 3.9 05/05/2021 1428   AST 20 05/05/2021 1428   ALT 13 05/05/2021 1428   ALKPHOS 65 05/05/2021 1428   BILITOT 0.4 05/05/2021 1428   GFRNONAA 58 (L) 06/13/2020 1039   GFRAA 67 06/13/2020 1039   Lab Results  Component Value Date   CHOL 140 05/05/2021   HDL 51 05/05/2021   LDLCALC 77 05/05/2021   TRIG 59 05/05/2021   Lab Results  Component Value Date   HGBA1C 5.0 09/22/2017   No results found for: "VITAMINB12" Lab Results  Component Value Date   TSH 1.920 10/01/2015     ASSESSMENT AND PLAN  81 y.o. year old male  with multiple medical conditions including cognitive changes, Intellectual disability, balance problem, history of subdural hematoma, questionable seizures who is presenting to establish care.  Both caregivers and staff member have not seen a seizure in the past 5 years.  He is currently on Depakote 250 mg but is mainly for mood.  On exam he was noted to have decreased facial features, positive glabellar reflex, increased rigidity and bradykinesia.  On top of that he also have resting Tremors left worse than right.  His gait is wide, shuffling  gait,  turn en block and positive pull test all consistent with parkinsonism.  He is on Abilify longstanding, previously 5 mg but currently decreased to 2 mg but his symptoms are still persistent.  Will try him on Sinemet.  I will see him in 6 months for follow-up.  They voiced understanding.   1. Cerebrovascular accident (CVA), unspecified mechanism (HCC)   2. Parkinsonism, unspecified Parkinsonism type     Patient Instructions  Trial of Sinemet 25/100 up to 3 times daily  Repeat Head CT  Continue your other medications  Follow up in 6 months or sooner if worse     Orders Placed This Encounter  Procedures   CT HEAD WO CONTRAST ( )    Meds ordered this encounter  Medications   carbidopa-levodopa (SINEMET IR) 25-100 MG tablet    Sig: Take 1 tablet by mouth 3 (three) times daily.    Dispense:  90 tablet    Refill:  3    Return in about 6 months (around 09/14/2022).  I have spent a total of 60 minutes dedicated to this patient today, preparing to see patient, performing a medically appropriate examination and evaluation, ordering tests and/or medications and procedures, and counseling and educating the patient/family/caregiver; independently interpreting result and communicating results to the family/patient/caregiver; and documenting clinical information in the electronic medical record.   Windell Norfolk, MD 03/16/2022, 6:01 PM  [facility] [address], [address] [place], [place] [address] [phone]

## 2022-03-16 NOTE — Telephone Encounter (Signed)
[employer] medicare/[place] med[employer] NPR sent to [facility] [phone] 

## 2022-03-16 NOTE — Patient Instructions (Addendum)
Trial of Sinemet 25/100 up to 3 times daily  Repeat Head CT  Continue your other medications  Follow up in 6 months or sooner if worse

## 2022-04-16 ENCOUNTER — Ambulatory Visit (HOSPITAL_COMMUNITY)
Admission: RE | Admit: 2022-04-16 | Discharge: 2022-04-16 | Disposition: A | Discharge status: Home / Self Care | Payer: Medicare Other | Source: Ambulatory Visit | Attending: Neurology | Admitting: Neurology

## 2022-04-16 DIAGNOSIS — I639 Cerebral infarction, unspecified: Secondary | ICD-10-CM | POA: Insufficient documentation

## 2022-04-21 DIAGNOSIS — R531 Weakness: Secondary | ICD-10-CM | POA: Diagnosis not present

## 2022-04-21 DIAGNOSIS — R2681 Unsteadiness on feet: Secondary | ICD-10-CM | POA: Diagnosis not present

## 2022-04-21 DIAGNOSIS — R29898 Other symptoms and signs involving the musculoskeletal system: Secondary | ICD-10-CM | POA: Diagnosis not present

## 2022-04-21 DIAGNOSIS — R296 Repeated falls: Secondary | ICD-10-CM | POA: Diagnosis not present

## 2022-04-23 DIAGNOSIS — R296 Repeated falls: Secondary | ICD-10-CM | POA: Diagnosis not present

## 2022-04-23 DIAGNOSIS — R29898 Other symptoms and signs involving the musculoskeletal system: Secondary | ICD-10-CM | POA: Diagnosis not present

## 2022-04-23 DIAGNOSIS — R531 Weakness: Secondary | ICD-10-CM | POA: Diagnosis not present

## 2022-04-23 DIAGNOSIS — R2681 Unsteadiness on feet: Secondary | ICD-10-CM | POA: Diagnosis not present

## 2022-07-01 DIAGNOSIS — Z6821 Body mass index (BMI) 21.0-21.9, adult: Secondary | ICD-10-CM | POA: Diagnosis not present

## 2022-07-01 DIAGNOSIS — R899 Unspecified abnormal finding in specimens from other organs, systems and tissues: Secondary | ICD-10-CM | POA: Diagnosis not present

## 2022-07-06 DIAGNOSIS — D696 Thrombocytopenia, unspecified: Secondary | ICD-10-CM | POA: Diagnosis not present

## 2022-07-06 DIAGNOSIS — E875 Hyperkalemia: Secondary | ICD-10-CM | POA: Diagnosis not present

## 2022-07-06 DIAGNOSIS — N182 Chronic kidney disease, stage 2 (mild): Secondary | ICD-10-CM | POA: Diagnosis not present

## 2022-07-06 DIAGNOSIS — I129 Hypertensive chronic kidney disease with stage 1 through stage 4 chronic kidney disease, or unspecified chronic kidney disease: Secondary | ICD-10-CM | POA: Diagnosis not present

## 2022-07-06 DIAGNOSIS — D508 Other iron deficiency anemias: Secondary | ICD-10-CM | POA: Diagnosis not present

## 2022-07-08 DIAGNOSIS — I129 Hypertensive chronic kidney disease with stage 1 through stage 4 chronic kidney disease, or unspecified chronic kidney disease: Secondary | ICD-10-CM | POA: Diagnosis not present

## 2022-07-08 DIAGNOSIS — E875 Hyperkalemia: Secondary | ICD-10-CM | POA: Diagnosis not present

## 2022-07-08 DIAGNOSIS — N182 Chronic kidney disease, stage 2 (mild): Secondary | ICD-10-CM | POA: Diagnosis not present

## 2022-07-28 ENCOUNTER — Telehealth: Payer: Self-pay | Admitting: Neurology

## 2022-07-28 MED ORDER — CARBIDOPA-LEVODOPA 25-100 MG PO TABS: 1.0000 | ORAL_TABLET | Freq: Three times a day (TID) | ORAL | 3 refills | Status: DC

## 2022-07-28 NOTE — Telephone Encounter (Signed)
Pt is needing a refill on his  carbidopa-levodopa (SINEMET IR) 25-100 MG tablet (Expired) and is needing it sent to [facility]

## 2022-07-28 NOTE — Telephone Encounter (Signed)
Refill has been sent per the patients request.

## 2022-07-29 DIAGNOSIS — Z79899 Other long term (current) drug therapy: Secondary | ICD-10-CM | POA: Diagnosis not present

## 2022-07-29 DIAGNOSIS — R5381 Other malaise: Secondary | ICD-10-CM | POA: Diagnosis not present

## 2022-07-29 DIAGNOSIS — Z0000 Encounter for general adult medical examination without abnormal findings: Secondary | ICD-10-CM | POA: Diagnosis not present

## 2022-07-29 DIAGNOSIS — I10 Essential (primary) hypertension: Secondary | ICD-10-CM | POA: Diagnosis not present

## 2022-07-29 DIAGNOSIS — R899 Unspecified abnormal finding in specimens from other organs, systems and tissues: Secondary | ICD-10-CM | POA: Diagnosis not present

## 2022-09-15 ENCOUNTER — Ambulatory Visit: Payer: Medicare Other | Admitting: Neurology

## 2022-09-16 ENCOUNTER — Ambulatory Visit (INDEPENDENT_AMBULATORY_CARE_PROVIDER_SITE_OTHER): Payer: 59 | Admitting: Neurology

## 2022-09-16 ENCOUNTER — Encounter: Payer: Self-pay | Admitting: Neurology

## 2022-09-16 VITALS — BP 102/60 | Ht 68.0 in | Wt 139.0 lb

## 2022-09-16 DIAGNOSIS — Z87898 Personal history of other specified conditions: Secondary | ICD-10-CM | POA: Diagnosis not present

## 2022-09-16 DIAGNOSIS — G20C Parkinsonism, unspecified: Secondary | ICD-10-CM | POA: Diagnosis not present

## 2022-09-16 NOTE — Patient Instructions (Signed)
Continue with Sinemet 25/100 3 times daily Consider discontinuing Abilify, continue with Depakote and paroxetine as needed, can increase medication if patient is having worsening mood or behavior. If he continues to have worsening mood or behavior despite increasing the Depakote and paroxetine, can consider starting Seroquel Continue your other medications Follow-up in 6 months or sooner if worse.

## 2022-09-16 NOTE — Progress Notes (Signed)
[facility]  PATIENT: Michael Mercado DOB: 11-Nov-1940  REQUESTING CLINICIAN: Ignatius Specking, MD HISTORY FROM: Guardian and staff  REASON FOR VISIT: Multiple falls/Seizure   HISTORICAL  CHIEF COMPLAINT:  Chief Complaint  Patient presents with   Follow-up    Rm 13,  with [facility] group home supervisor and Melissa SW/guardian, f/u CVA/Parkinsonism, no new symptoms or changes   INTERVAL HISTORY 09/16/2022:  Patient presents  for follow-up, he is accompanied by group home supervisor and guardian.  At last visit we have started him on Sinemet.  Supervisor reports that he is doing much better.  Since last visit he has not had any falls.  Now he is able to eat independently and not dropping his food and holding his cup.  They are happy with the improvement that he has made.  He still has tremors but improved compared to last time.  He still on the aripiprazole but only 2 mg daily.  Overall he is doing better and they are comfortable with his current status.   HISTORY OF PRESENT ILLNESS:  This is a 82 year old gentleman with multiple medical conditions including cognitive changes, Intellectual disability, balance problem, history of subdural hematoma,questionable seizures who is presenting to establish care.  Patient was previously seen by Dr. Gerilyn Pilgrim but since he retired now patient needs a new neurologist.  Today, he presented with a staff member named Karleen Hampshire and his guardian named Hyacinth Meeker.  When he comes to the seizures, both staff member and guardian has reported patient has been living with them for the past 5 years and has not have any seizure they have not witnessed any seizures.  Currently he is on Depakote 250 mg twice daily and this is for mood. They do report tremor and multiple falls.  For his mood problem and psychiatric illness he is on Abilify.  But on chart review he has noted that his Levophed has been decreased from 5 mg to 2 mg daily.  He does have  slowness of his movement, abnormal gait and tremor on exam.    OTHER MEDICAL CONDITIONS: Cognitive changes, Intellectual disability, ?seizures, balance problem, history of subdural hematoma and multiple falls   REVIEW OF SYSTEMS: Full 14 system review of systems performed and negative with exception of: Unable to fully obtain   ALLERGIES: No Known Allergies  HOME MEDICATIONS: Outpatient Medications Prior to Visit  Medication Sig Dispense Refill   ACETAMINOPHEN EXTRA STRENGTH 500 MG tablet TAKE 2 TABS BY MOUTH EVERY 4 HRS AS NEEDED FOR HEADACHE/MILD TO MODERATE PAIN OR TEMP OF 100.3 & ABOVE IF TEMP UNRESOLVED AFTER 24 HRS MUSTCALL NURSE TO FOLLOW UP W/ MD. 30 tablet 11   amLODipine (NORVASC) 5 MG tablet TAKE (1) TABLET BY MOUTH ONCE DAILY. 25 tablet 11   ARIPiprazole (ABILIFY) 2 MG tablet Take 2 mg by mouth daily.     BANOPHEN 25 MG capsule TAKE 1 CAPSULE BY MOUTH DAILY AS NEEDED FOR ALLERGY SYMPTOMS. CONTACT NURSE IF SYMPTOMS WORSEN. 30 capsule 11   BREO ELLIPTA 100-25 MCG/INH AEPB INHALE 1 PUFF ONCE DAILY. 60 each 11   carbidopa-levodopa (SINEMET IR) 25-100 MG tablet Take 1 tablet by mouth 3 (three) times daily. 90 tablet 3   cetirizine (ZYRTEC) 10 MG tablet TAKE (1) TABLET BY MOUTH ONCE DAILY. 1 tablet 11   Diaper Rash Products (DESITIN) OINT Apply 1 application topically 4 (four) times daily as needed. 99 g 3   FEROSUL 325 (65 Fe) MG tablet Take 325 mg by  mouth 2 (two) times daily.     lisinopril (ZESTRIL) 20 MG tablet TAKE (1) TABLET BY MOUTH ONCE DAILY. 25 tablet 11   pantoprazole (PROTONIX) 40 MG tablet TAKE (1) TABLET BY MOUTH ONCE DAILY. 25 tablet 11   PARoxetine (PAXIL) 20 MG tablet TAKE 1 TABLET BY MOUTH AT BEDTIME. (Patient taking differently: Take 10 mg by mouth at bedtime.) 24 tablet 11   triamcinolone cream (KENALOG) 0.1 % APPLY TOPICALLY TO AFFECTED AREA(S) TWICE DAILY. 80 g 4   cephALEXin (KEFLEX) 500 MG capsule Take 1 capsule (500 mg total) by mouth 4 (four) times daily.  (Patient not taking: Reported on 09/16/2022) 28 capsule 0   divalproex (DEPAKOTE) 250 MG DR tablet Take by mouth. (Patient not taking: Reported on 09/16/2022)     nicotine (NICODERM CQ - DOSED IN MG/24 HR) 7 mg/24hr patch PLACE 1 PATCH ONTO SKIN ONCE DAILY. START 7MG  ONCE 14MG  IS USED. (Patient not taking: Reported on 09/16/2022) 28 patch 11   Skin Protectants, Misc. (PERIGUARD) OINT Apply topically. (Patient not taking: Reported on 09/16/2022)     No facility-administered medications prior to visit.    PAST MEDICAL HISTORY: Past Medical History:  Diagnosis Date   Chronic kidney disease    Hypercholesteremia    Hypertension    Moderate intellectual disability    Mood disorder (HCC)    MR (mental retardation)    Obesity     PAST SURGICAL HISTORY: Past Surgical History:  Procedure Laterality Date   CRANIOTOMY Left 07/07/2018   Procedure: CRANIOTOMY HEMATOMA EVACUATION SUBDURAL;  Surgeon: Tia Alert, MD;  Location: [facility] OR;  Service: Neurosurgery;  Laterality: Left;   CRANIOTOMY Left 07/13/2018   Procedure: Redo Left CRANIOTOMY FOR SUBDURAL HEMATOMA;  Surgeon: Tia Alert, MD;  Location: [facility] OR;  Service: Neurosurgery;  Laterality: Left;    FAMILY HISTORY: Family History  Problem Relation Age of Onset   Stroke Sister     SOCIAL HISTORY: Social History   Socioeconomic History   Marital status: Single    Spouse name: Not on file   Number of children: Not on file   Years of education: Not on file   Highest education level: Not on file  Occupational History   Occupation: disabled  Tobacco Use   Smoking status: Former    Packs/day: .33    Types: Cigarettes    Quit date: 09/02/2018    Years since quitting: 4.0   Smokeless tobacco: Never  Vaping Use   Vaping Use: Never used  Substance and Sexual Activity   Alcohol use: No    Alcohol/week: 0.0 standard drinks of alcohol   Drug use: No   Sexual activity: Never  Other Topics Concern   Not on file  Social History  Narrative   Living in [facility]   Left handed   Caffeine none   Social Determinants of Health   Financial Resource Strain: Low Risk  (07/19/2020)   Overall Financial Resource Strain (CARDIA)    Difficulty of Paying Living Expenses: Not hard at all  Food Insecurity: No Food Insecurity (07/19/2020)   Hunger Vital Sign    Worried About Running Out of Food in the Last Year: Never true    Ran Out of Food in the Last Year: Never true  Transportation Needs: No Transportation Needs (07/19/2020)   PRAPARE - Administrator, Civil Service (Medical): No    Lack of Transportation (Non-Medical): No  Physical Activity: Sufficiently Active (07/19/2020)   Exercise Vital  Sign    Days of Exercise per Week: 7 days    Minutes of Exercise per Session: 30 min  Stress: No Stress Concern Present (07/19/2020)   [employer] of Occupational Health - Occupational Stress Questionnaire    Feeling of Stress : Only a little  Social Connections: Moderately Isolated (07/19/2020)   Social Connection and Isolation Panel [NHANES]    Frequency of Communication with Friends and Family: Twice a week    Frequency of Social Gatherings with Friends and Family: Once a week    Attends Religious Services: Never    Database administrator or Organizations: Yes    Attends Engineer, structural: More than 4 times per year    Marital Status: Never married  Intimate Partner Violence: Not At Risk (07/19/2020)   Humiliation, Afraid, Rape, and Kick questionnaire    Fear of Current or Ex-Partner: No    Emotionally Abused: No    Physically Abused: No    Sexually Abused: No    PHYSICAL EXAM  GENERAL EXAM/CONSTITUTIONAL: Vitals:  Vitals:   09/16/22 1017  BP: 102/60  Weight: 139 lb (63 kg)  Height: 5\' 8"  (1.727 m)    Body mass index is 21.13 kg/m. Wt Readings from Last 3 Encounters:  09/16/22 139 lb (63 kg)  03/16/22 147 lb 8 oz (66.9 kg)  06/16/21 145 lb (65.8 kg)   Patient is in no distress;  well developed, nourished and groomed; neck is supple  MUSCULOSKELETAL: Gait, strength, tone, movements noted in Neurologic exam below  NEUROLOGIC: MENTAL STATUS:     05/10/2018   12:43 PM 09/23/2016    3:07 PM  MMSE - Mini Mental State Exam  Not completed: Unable to complete Unable to complete   awake, alert, oriented to person 2nd, 3rd, 4th, 6th - able to track examiner, and count fingers, 5th - decrease facial features 7th - facial strength symmetric 8th - hearing intact 9th - palate elevates symmetrically, uvula midline 11th - shoulder shrug symmetric 12th - tongue protrusion midline He has a positive glabellar reflex   MOTOR:  normal bulk, full strength in the BUE, BLE, there is bradykinesia and increase tone but improved   SENSORY:  normal and symmetric to light touch  COORDINATION:  He does have resting tremors, difficulty with fine movement, dysdiadochokinesia is present, difficulty with fine finger movements normal  GAIT/STATION:  Improved gait, able to stand unassisted, mild shuffling     DIAGNOSTIC DATA (LABS, IMAGING, TESTING) - I reviewed patient records, labs, notes, testing and imaging myself where available.  Lab Results  Component Value Date   WBC 5.3 05/05/2021   HGB 13.8 05/05/2021   HCT 41.1 05/05/2021   MCV 92 05/05/2021   PLT 133 (L) 05/05/2021      Component Value Date/Time   NA 142 05/05/2021 1428   K 5.2 05/05/2021 1428   CL 103 05/05/2021 1428   CO2 27 05/05/2021 1428   GLUCOSE 73 05/05/2021 1428   GLUCOSE 150 (H) 09/05/2018 1446   BUN 19 05/05/2021 1428   CREATININE 1.15 05/05/2021 1428   CALCIUM 9.3 05/05/2021 1428   PROT 6.0 05/05/2021 1428   ALBUMIN 3.9 05/05/2021 1428   AST 20 05/05/2021 1428   ALT 13 05/05/2021 1428   ALKPHOS 65 05/05/2021 1428   BILITOT 0.4 05/05/2021 1428   GFRNONAA 58 (L) 06/13/2020 1039   GFRAA 67 06/13/2020 1039   Lab Results  Component Value Date   CHOL 140 05/05/2021   HDL 51  05/05/2021    LDLCALC 77 05/05/2021   TRIG 59 05/05/2021   Lab Results  Component Value Date   HGBA1C 5.0 09/22/2017   No results found for: "VITAMINB12" Lab Results  Component Value Date   TSH 1.920 10/01/2015     ASSESSMENT AND PLAN  82 y.o. year old male  with multiple medical conditions including cognitive changes, Intellectual disability, balance problem, history of subdural hematoma, questionable seizures who is presenting for follow up.  In terms of the seizures, he is doing well no seizure or seizure-like activity since last visit.  He remains on Depakote.  When it comes to his parkinsonism, patient is doing well on Sinemet 25/100.  They report the tremors have markedly improved, his balance and gait has also improved.  He is not shaking as previously.  He remains on Abilify but 2 mg.  Plan for now is if possible to discontinue Abilify and continue patient on both of Depakote and paroxetine.  If there is worsening behavior, they can consider Seroquel.  Will continue him on Sinemet 25/100 and if we need to increase the dose we will do so.  I will see him again in 6 months for follow-up or sooner if worse.    1. Parkinsonism, unspecified Parkinsonism type   2. History of seizure      Patient Instructions  Continue with Sinemet 25/100 3 times daily Consider discontinuing Abilify, continue with Depakote and paroxetine as needed, can increase medication if patient is having worsening mood or behavior. If he continues to have worsening mood or behavior despite increasing the Depakote and paroxetine, can consider starting Seroquel Continue your other medications Follow-up in 6 months or sooner if worse.   No orders of the defined types were placed in this encounter.   No orders of the defined types were placed in this encounter.   Return in about 6 months (around 03/19/2023).   Windell Norfolk, MD 09/16/2022, 10:15 PM [facility] [address], [address] [place],  [place] [address] [phone]

## 2022-10-01 DIAGNOSIS — I10 Essential (primary) hypertension: Secondary | ICD-10-CM | POA: Diagnosis not present

## 2022-10-01 DIAGNOSIS — Z713 Dietary counseling and surveillance: Secondary | ICD-10-CM | POA: Diagnosis not present

## 2022-10-01 DIAGNOSIS — R296 Repeated falls: Secondary | ICD-10-CM | POA: Diagnosis not present

## 2023-01-01 DIAGNOSIS — R5381 Other malaise: Secondary | ICD-10-CM | POA: Diagnosis not present

## 2023-01-01 DIAGNOSIS — Z713 Dietary counseling and surveillance: Secondary | ICD-10-CM | POA: Diagnosis not present

## 2023-01-01 DIAGNOSIS — Z7409 Other reduced mobility: Secondary | ICD-10-CM | POA: Diagnosis not present

## 2023-01-01 DIAGNOSIS — I10 Essential (primary) hypertension: Secondary | ICD-10-CM | POA: Diagnosis not present

## 2023-01-04 DIAGNOSIS — D509 Iron deficiency anemia, unspecified: Secondary | ICD-10-CM | POA: Diagnosis not present

## 2023-01-04 DIAGNOSIS — R809 Proteinuria, unspecified: Secondary | ICD-10-CM | POA: Diagnosis not present

## 2023-01-04 DIAGNOSIS — N1831 Chronic kidney disease, stage 3a: Secondary | ICD-10-CM | POA: Diagnosis not present

## 2023-01-07 DIAGNOSIS — D696 Thrombocytopenia, unspecified: Secondary | ICD-10-CM | POA: Diagnosis not present

## 2023-01-07 DIAGNOSIS — E559 Vitamin D deficiency, unspecified: Secondary | ICD-10-CM | POA: Diagnosis not present

## 2023-01-07 DIAGNOSIS — N182 Chronic kidney disease, stage 2 (mild): Secondary | ICD-10-CM | POA: Diagnosis not present

## 2023-01-07 DIAGNOSIS — I129 Hypertensive chronic kidney disease with stage 1 through stage 4 chronic kidney disease, or unspecified chronic kidney disease: Secondary | ICD-10-CM | POA: Diagnosis not present

## 2023-02-11 DIAGNOSIS — J449 Chronic obstructive pulmonary disease, unspecified: Secondary | ICD-10-CM | POA: Diagnosis not present

## 2023-03-26 DIAGNOSIS — I10 Essential (primary) hypertension: Secondary | ICD-10-CM | POA: Diagnosis not present

## 2023-03-26 DIAGNOSIS — G20A1 Parkinson's disease without dyskinesia, without mention of fluctuations: Secondary | ICD-10-CM | POA: Diagnosis not present

## 2023-03-26 DIAGNOSIS — J449 Chronic obstructive pulmonary disease, unspecified: Secondary | ICD-10-CM | POA: Diagnosis not present

## 2023-04-07 ENCOUNTER — Telehealth: Payer: Self-pay | Admitting: Neurology

## 2023-04-07 ENCOUNTER — Ambulatory Visit (INDEPENDENT_AMBULATORY_CARE_PROVIDER_SITE_OTHER): Payer: 59 | Admitting: Neurology

## 2023-04-07 ENCOUNTER — Encounter: Payer: Self-pay | Admitting: Neurology

## 2023-04-07 VITALS — BP 144/81 | HR 82 | Ht 68.0 in | Wt 139.5 lb

## 2023-04-07 DIAGNOSIS — Z87898 Personal history of other specified conditions: Secondary | ICD-10-CM

## 2023-04-07 DIAGNOSIS — I639 Cerebral infarction, unspecified: Secondary | ICD-10-CM

## 2023-04-07 DIAGNOSIS — G20C Parkinsonism, unspecified: Secondary | ICD-10-CM

## 2023-04-07 MED ORDER — CARBIDOPA-LEVODOPA 25-100 MG PO TABS: 1.0000 | ORAL_TABLET | Freq: Three times a day (TID) | ORAL | 3 refills | Status: DC

## 2023-04-07 NOTE — Patient Instructions (Addendum)
Restart Sinemet 25/100 three times daily Consider discontinuing Depakote and monitor for behavior  Consider decreasing Abilify to 1 mg daily or discontinuing Abilify and monitor for behavior  Return in 6 months or sooner if worse

## 2023-04-07 NOTE — Progress Notes (Signed)
[facility]  PATIENT: Michael Mercado DOB: 1940/06/25  REQUESTING CLINICIAN: Ignatius Specking, MD HISTORY FROM: Guardian and staff  REASON FOR VISIT: Parkinsonism follow up.    HISTORICAL  CHIEF COMPLAINT:  Chief Complaint  Patient presents with   Follow-up    Rm13, caregiver present, cva parkinson Return in about 6 months: worsened tremors and frequent itching reported by the caregiver.    INTERVAL HISTORY 04/07/2023:  Patient presents today for follow up. He is accompanied by caregiver. Last visit was in May. At that time, he was doing well on Sinemet, less tremor, able to hold his cup and better gait. For some reasons, his Sinemet was switched to PRN only, therefore he has not been taking this medication and his symptoms including tremors, rigidity and gait got worse.    INTERVAL HISTORY 09/16/2022:  Patient presents  for follow-up, he is accompanied by group home supervisor and guardian.  At last visit we have started him on Sinemet.  Supervisor reports that he is doing much better.  Since last visit he has not had any falls.  Now he is able to eat independently and not dropping his food and holding his cup.  They are happy with the improvement that he has made.  He still has tremors but improved compared to last time.  He still on the aripiprazole but only 2 mg daily.  Overall he is doing better and they are comfortable with his current status.   HISTORY OF PRESENT ILLNESS:  This is a 82 year old gentleman with multiple medical conditions including cognitive changes, Intellectual disability, balance problem, history of subdural hematoma,questionable seizures who is presenting to establish care.  Patient was previously seen by Dr. Gerilyn Pilgrim but since he retired now patient needs a new neurologist.  Today, he presented with a staff member named Karleen Hampshire and his guardian named Hyacinth Meeker.  When he comes to the seizures, both staff member and guardian has reported  patient has been living with them for the past 5 years and has not have any seizure they have not witnessed any seizures.  Currently he is on Depakote 250 mg twice daily and this is for mood. They do report tremor and multiple falls.  For his mood problem and psychiatric illness he is on Abilify.  But on chart review he has noted that his Levophed has been decreased from 5 mg to 2 mg daily.  He does have slowness of his movement, abnormal gait and tremor on exam.    OTHER MEDICAL CONDITIONS: Cognitive changes, Intellectual disability, ?seizures, balance problem, history of subdural hematoma and multiple falls   REVIEW OF SYSTEMS: Full 14 system review of systems performed and negative with exception of: Unable to fully obtain   ALLERGIES: No Known Allergies  HOME MEDICATIONS: Outpatient Medications Prior to Visit  Medication Sig Dispense Refill   amLODipine (NORVASC) 5 MG tablet TAKE (1) TABLET BY MOUTH ONCE DAILY. 25 tablet 11   ARIPiprazole (ABILIFY) 2 MG tablet Take 2 mg by mouth daily.     BREO ELLIPTA 100-25 MCG/INH AEPB INHALE 1 PUFF ONCE DAILY. 60 each 11   cetirizine (ZYRTEC) 10 MG tablet TAKE (1) TABLET BY MOUTH ONCE DAILY. 1 tablet 11   divalproex (DEPAKOTE) 250 MG DR tablet Take by mouth.     FEROSUL 325 (65 Fe) MG tablet Take 325 mg by mouth 2 (two) times daily.     lisinopril (ZESTRIL) 20 MG tablet TAKE (1) TABLET BY MOUTH ONCE DAILY. 25 tablet  11   pantoprazole (PROTONIX) 40 MG tablet TAKE (1) TABLET BY MOUTH ONCE DAILY. 25 tablet 11   PARoxetine (PAXIL) 20 MG tablet TAKE 1 TABLET BY MOUTH AT BEDTIME. (Patient taking differently: Take 10 mg by mouth at bedtime.) 24 tablet 11   triamcinolone cream (KENALOG) 0.1 % APPLY TOPICALLY TO AFFECTED AREA(S) TWICE DAILY. 80 g 4   ACETAMINOPHEN EXTRA STRENGTH 500 MG tablet TAKE 2 TABS BY MOUTH EVERY 4 HRS AS NEEDED FOR HEADACHE/MILD TO MODERATE PAIN OR TEMP OF 100.3 & ABOVE IF TEMP UNRESOLVED AFTER 24 HRS MUSTCALL NURSE TO FOLLOW UP W/ MD.  30 tablet 11   BANOPHEN 25 MG capsule TAKE 1 CAPSULE BY MOUTH DAILY AS NEEDED FOR ALLERGY SYMPTOMS. CONTACT NURSE IF SYMPTOMS WORSEN. 30 capsule 11   carbidopa-levodopa (SINEMET IR) 25-100 MG tablet Take 1 tablet by mouth 3 (three) times daily. 90 tablet 3   cephALEXin (KEFLEX) 500 MG capsule Take 1 capsule (500 mg total) by mouth 4 (four) times daily. (Patient not taking: Reported on 09/16/2022) 28 capsule 0   Diaper Rash Products (DESITIN) OINT Apply 1 application topically 4 (four) times daily as needed. 99 g 3   nicotine (NICODERM CQ - DOSED IN MG/24 HR) 7 mg/24hr patch PLACE 1 PATCH ONTO SKIN ONCE DAILY. START 7MG  ONCE 14MG  IS USED. (Patient not taking: Reported on 09/16/2022) 28 patch 11   Skin Protectants, Misc. (PERIGUARD) OINT Apply topically. (Patient not taking: Reported on 09/16/2022)     No facility-administered medications prior to visit.    PAST MEDICAL HISTORY: Past Medical History:  Diagnosis Date   Chronic kidney disease    Hypercholesteremia    Hypertension    Moderate intellectual disability    Mood disorder (HCC)    MR (mental retardation)    Obesity     PAST SURGICAL HISTORY: Past Surgical History:  Procedure Laterality Date   CRANIOTOMY Left 07/07/2018   Procedure: CRANIOTOMY HEMATOMA EVACUATION SUBDURAL;  Surgeon: Tia Alert, MD;  Location: [facility] OR;  Service: Neurosurgery;  Laterality: Left;   CRANIOTOMY Left 07/13/2018   Procedure: Redo Left CRANIOTOMY FOR SUBDURAL HEMATOMA;  Surgeon: Tia Alert, MD;  Location: [facility] OR;  Service: Neurosurgery;  Laterality: Left;    FAMILY HISTORY: Family History  Problem Relation Age of Onset   Stroke Sister     SOCIAL HISTORY: Social History   Socioeconomic History   Marital status: Single    Spouse name: Not on file   Number of children: Not on file   Years of education: Not on file   Highest education level: Not on file  Occupational History   Occupation: disabled  Tobacco Use   Smoking status: Former     Current packs/day: 0.00    Types: Cigarettes    Quit date: 09/02/2018    Years since quitting: 4.5   Smokeless tobacco: Never  Vaping Use   Vaping status: Never Used  Substance and Sexual Activity   Alcohol use: No    Alcohol/week: 0.0 standard drinks of alcohol   Drug use: No   Sexual activity: Never  Other Topics Concern   Not on file  Social History Narrative   Living in [facility]   Left handed   Caffeine none   Social Drivers of Health   Financial Resource Strain: Low Risk  (07/19/2020)   Overall Financial Resource Strain (CARDIA)    Difficulty of Paying Living Expenses: Not hard at all  Food Insecurity: No Food Insecurity (07/19/2020)  Hunger Vital Sign    Worried About Running Out of Food in the Last Year: Never true    Ran Out of Food in the Last Year: Never true  Transportation Needs: No Transportation Needs (07/19/2020)   PRAPARE - Administrator, Civil Service (Medical): No    Lack of Transportation (Non-Medical): No  Physical Activity: Sufficiently Active (07/19/2020)   Exercise Vital Sign    Days of Exercise per Week: 7 days    Minutes of Exercise per Session: 30 min  Stress: No Stress Concern Present (07/19/2020)   [employer] of Occupational Health - Occupational Stress Questionnaire    Feeling of Stress : Only a little  Social Connections: Moderately Isolated (07/19/2020)   Social Connection and Isolation Panel [NHANES]    Frequency of Communication with Friends and Family: Twice a week    Frequency of Social Gatherings with Friends and Family: Once a week    Attends Religious Services: Never    Database administrator or Organizations: Yes    Attends Engineer, structural: More than 4 times per year    Marital Status: Never married  Intimate Partner Violence: Not At Risk (07/19/2020)   Humiliation, Afraid, Rape, and Kick questionnaire    Fear of Current or Ex-Partner: No    Emotionally Abused: No    Physically Abused: No     Sexually Abused: No    PHYSICAL EXAM  GENERAL EXAM/CONSTITUTIONAL: Vitals:  Vitals:   04/07/23 1152  BP: (!) 144/81  Pulse: 82  Weight: 139 lb 8 oz (63.3 kg)  Height: 5\' 8"  (1.727 m)    Body mass index is 21.21 kg/m. Wt Readings from Last 3 Encounters:  04/07/23 139 lb 8 oz (63.3 kg)  09/16/22 139 lb (63 kg)  03/16/22 147 lb 8 oz (66.9 kg)   Patient is in no distress; well developed, nourished and groomed; neck is supple  MUSCULOSKELETAL: Gait, strength, tone, movements noted in Neurologic exam below  NEUROLOGIC: MENTAL STATUS:     05/10/2018   12:43 PM 09/23/2016    3:07 PM  MMSE - Mini Mental State Exam  Not completed: Unable to complete Unable to complete   awake, alert, oriented to person 2nd, 3rd, 4th, 6th - able to track examiner, and count fingers, 5th - decrease facial features 7th - facial strength symmetric 8th - hearing intact 9th - palate elevates symmetrically, uvula midline 11th - shoulder shrug symmetric 12th - tongue protrusion midline He has a positive glabellar reflex   MOTOR:  normal bulk, full strength in the BUE, BLE, there is increase rigidity, tone and bradykinesia. High amplitude tremors present at rest  SENSORY:  normal and symmetric to light touch  COORDINATION:  He does have resting tremors, difficulty with fine movement, dysdiadochokinesia is present, difficulty with fine finger movements normal  GAIT/STATION:  shuffling and occasionally needs assistance    DIAGNOSTIC DATA (LABS, IMAGING, TESTING) - I reviewed patient records, labs, notes, testing and imaging myself where available.  Lab Results  Component Value Date   WBC 5.3 05/05/2021   HGB 13.8 05/05/2021   HCT 41.1 05/05/2021   MCV 92 05/05/2021   PLT 133 (L) 05/05/2021      Component Value Date/Time   NA 142 05/05/2021 1428   K 5.2 05/05/2021 1428   CL 103 05/05/2021 1428   CO2 27 05/05/2021 1428   GLUCOSE 73 05/05/2021 1428   GLUCOSE 150 (H) 09/05/2018 1446    BUN 19 05/05/2021 1428  CREATININE 1.15 05/05/2021 1428   CALCIUM 9.3 05/05/2021 1428   PROT 6.0 05/05/2021 1428   ALBUMIN 3.9 05/05/2021 1428   AST 20 05/05/2021 1428   ALT 13 05/05/2021 1428   ALKPHOS 65 05/05/2021 1428   BILITOT 0.4 05/05/2021 1428   GFRNONAA 58 (L) 06/13/2020 1039   GFRAA 67 06/13/2020 1039   Lab Results  Component Value Date   CHOL 140 05/05/2021   HDL 51 05/05/2021   LDLCALC 77 05/05/2021   TRIG 59 05/05/2021   Lab Results  Component Value Date   HGBA1C 5.0 09/22/2017   No results found for: "VITAMINB12" Lab Results  Component Value Date   TSH 1.920 10/01/2015     ASSESSMENT AND PLAN  82 y.o. year old male  with multiple medical conditions including cognitive changes, Intellectual disability, balance problem, history of subdural hematoma, questionable seizures who is presenting for follow up.  In terms of the seizures, he is doing well no seizure or seizure-like activity since last visit.    When it comes to his parkinsonism, he was doing well on Sinemet 25/100 TID, but for some reason, the medication was discontinued and moved to PRN, hence he has not been taking it. His symptoms got worse. Plan will be to restart Sinemet 25/100  three time daily. I have also recommended to discontinue Depakote and monitor his symptoms. Also recommended to reduce or discontinue Abilify as it can make his Parkinsonism worse.    1. Parkinsonism, unspecified Parkinsonism type (HCC)   2. Cerebrovascular accident (CVA), unspecified mechanism (HCC)   3. History of seizure      Patient Instructions  Restart Sinemet 25/100 three times daily Consider discontinuing Depakote and monitor for behavior  Consider decreasing Abilify to 1 mg daily or discontinuing Abilify and monitor for behavior  Return in 6 months or sooner if worse      No orders of the defined types were placed in this encounter.   Meds ordered this encounter  Medications   carbidopa-levodopa  (SINEMET IR) 25-100 MG tablet    Sig: Take 1 tablet by mouth 3 (three) times daily.    Dispense:  90 tablet    Refill:  3    Return in about 6 months (around 10/06/2023).   Windell Norfolk, MD 04/07/2023, 1:46 PM [facility] [address] [place], [place] [address] [phone]

## 2023-04-07 NOTE — Telephone Encounter (Signed)
Thanks

## 2023-04-07 NOTE — Telephone Encounter (Signed)
Pt's manager has called back to inform that regarding pt's carbidopa-levodopa (SINEMET IR) 25-100 MG tablet  it is PRN, Marylene Land can be called at [phone] if there are additional questions for her.

## 2023-07-02 DIAGNOSIS — N182 Chronic kidney disease, stage 2 (mild): Secondary | ICD-10-CM | POA: Diagnosis not present

## 2023-07-02 DIAGNOSIS — E559 Vitamin D deficiency, unspecified: Secondary | ICD-10-CM | POA: Diagnosis not present

## 2023-07-02 DIAGNOSIS — I129 Hypertensive chronic kidney disease with stage 1 through stage 4 chronic kidney disease, or unspecified chronic kidney disease: Secondary | ICD-10-CM | POA: Diagnosis not present

## 2023-07-02 DIAGNOSIS — N189 Chronic kidney disease, unspecified: Secondary | ICD-10-CM | POA: Diagnosis not present

## 2023-07-02 DIAGNOSIS — I10 Essential (primary) hypertension: Secondary | ICD-10-CM | POA: Diagnosis not present

## 2023-07-09 DIAGNOSIS — D696 Thrombocytopenia, unspecified: Secondary | ICD-10-CM | POA: Diagnosis not present

## 2023-07-09 DIAGNOSIS — N182 Chronic kidney disease, stage 2 (mild): Secondary | ICD-10-CM | POA: Diagnosis not present

## 2023-07-09 DIAGNOSIS — I129 Hypertensive chronic kidney disease with stage 1 through stage 4 chronic kidney disease, or unspecified chronic kidney disease: Secondary | ICD-10-CM | POA: Diagnosis not present

## 2023-07-09 DIAGNOSIS — E559 Vitamin D deficiency, unspecified: Secondary | ICD-10-CM | POA: Diagnosis not present

## 2023-07-13 DIAGNOSIS — R296 Repeated falls: Secondary | ICD-10-CM | POA: Diagnosis not present

## 2023-07-13 DIAGNOSIS — J449 Chronic obstructive pulmonary disease, unspecified: Secondary | ICD-10-CM | POA: Diagnosis not present

## 2023-07-13 DIAGNOSIS — G20A1 Parkinson's disease without dyskinesia, without mention of fluctuations: Secondary | ICD-10-CM | POA: Diagnosis not present

## 2023-07-13 DIAGNOSIS — I10 Essential (primary) hypertension: Secondary | ICD-10-CM | POA: Diagnosis not present

## 2023-08-04 DIAGNOSIS — R233 Spontaneous ecchymoses: Secondary | ICD-10-CM | POA: Diagnosis not present

## 2023-08-04 DIAGNOSIS — Z0000 Encounter for general adult medical examination without abnormal findings: Secondary | ICD-10-CM | POA: Diagnosis not present

## 2023-08-04 DIAGNOSIS — I10 Essential (primary) hypertension: Secondary | ICD-10-CM | POA: Diagnosis not present

## 2023-08-04 DIAGNOSIS — R296 Repeated falls: Secondary | ICD-10-CM | POA: Diagnosis not present

## 2023-08-04 DIAGNOSIS — J449 Chronic obstructive pulmonary disease, unspecified: Secondary | ICD-10-CM | POA: Diagnosis not present

## 2023-08-04 DIAGNOSIS — G20A1 Parkinson's disease without dyskinesia, without mention of fluctuations: Secondary | ICD-10-CM | POA: Diagnosis not present

## 2023-08-04 DIAGNOSIS — Z79899 Other long term (current) drug therapy: Secondary | ICD-10-CM | POA: Diagnosis not present

## 2023-08-31 ENCOUNTER — Telehealth: Payer: Self-pay | Admitting: Neurology

## 2023-08-31 NOTE — Telephone Encounter (Signed)
 Last saw Dr. Samara Crest 04/07/23. Dx; Parkinsonism. Plan per last visit below.   I called back and spoke w/ Shelvy Dickens who states he takes Sinemet  25-100mg  three times daily. Picking skin off all over body and picking at objects around him. This has been ongoing for about 2.5 months now. Confirmed he stopped Depakote and Abilify. No other new meds started recently. Updated med list/allergies/pharmacy on file. Aware I will send to Dr. Samara Crest to review and call back with his recommendation. Aware he is out today and we will call back tomorrow.

## 2023-08-31 NOTE — Telephone Encounter (Signed)
 At 9:20 pt's care taker left a vm asking for a call from RN to discuss a request of increasing pt's Parkinsonism medication, please call.

## 2023-09-03 ENCOUNTER — Other Ambulatory Visit: Payer: Self-pay | Admitting: Neurology

## 2023-09-03 MED ORDER — CARBIDOPA-LEVODOPA 25-100 MG PO TABS: 1.0000 | ORAL_TABLET | Freq: Three times a day (TID) | ORAL | 11 refills | Status: AC

## 2023-09-03 MED ORDER — NUPLAZID 34 MG PO CAPS: 34.0000 mg | ORAL_CAPSULE | Freq: Every day | ORAL | 6 refills | Status: DC

## 2023-09-03 NOTE — Telephone Encounter (Signed)
 Tremors improved since taking Sinemet  TID but still pikcing his skin. Will try him on Primavanserin.

## 2023-09-06 NOTE — Telephone Encounter (Signed)
 Called and relayed information to group home manager angela and she voiced gratitude and understanding

## 2023-09-09 ENCOUNTER — Telehealth: Payer: Self-pay | Admitting: Neurology

## 2023-09-09 ENCOUNTER — Other Ambulatory Visit (HOSPITAL_COMMUNITY): Payer: Self-pay

## 2023-09-09 ENCOUNTER — Telehealth: Payer: Self-pay

## 2023-09-09 NOTE — Telephone Encounter (Signed)
 Pharmacy Patient Advocate Encounter   Received notification from CoverMyMeds that prior authorization for Nuplazid  34MG  capsules is required/requested.   Insurance verification completed.   The patient is insured through [facility] .   Per test claim: PA required; PA submitted to above mentioned insurance via CoverMyMeds Key/confirmation #/EOC [id number] Status is pending

## 2023-09-09 NOTE — Telephone Encounter (Signed)
 Pharmacy Patient Advocate Encounter  Received notification from [employer] that Prior Authorization for Nuplazid  34MG  capsules has been APPROVED from 09/09/2023 to 04/19/2024. Ran test claim, Copay is $0. This test claim was processed through [facility]- copay amounts may vary at other pharmacies due to pharmacy/plan contracts, or as the patient moves through the different stages of their insurance plan.   PA #/Case ID/Reference #: [id number]

## 2023-09-21 DIAGNOSIS — D631 Anemia in chronic kidney disease: Secondary | ICD-10-CM | POA: Diagnosis not present

## 2023-09-21 DIAGNOSIS — N189 Chronic kidney disease, unspecified: Secondary | ICD-10-CM | POA: Diagnosis not present

## 2023-09-21 DIAGNOSIS — R809 Proteinuria, unspecified: Secondary | ICD-10-CM | POA: Diagnosis not present

## 2023-09-23 ENCOUNTER — Telehealth: Payer: Self-pay | Admitting: Neurology

## 2023-09-23 NOTE — Telephone Encounter (Signed)
 Mona Angle from [facility] called to requesting to speak to nurse about missing codes for Pt medication Pimavanserin Tartrate  (NUPLAZID ) 34 MG CAPS \  [facility] call back  [phone]

## 2023-09-23 NOTE — Telephone Encounter (Signed)
 Returned call to [phone]  [facility] waited on hold for over 20 mins then spoke to pharmd kassidy and provided icd 10 code G20.C 9Parkinsonism, unspecified Parkinsonism type). I was told that would be all the pharmacy needed to proceed.

## 2023-10-12 ENCOUNTER — Telehealth: Payer: Self-pay | Admitting: Neurology

## 2023-10-12 MED ORDER — NUPLAZID 34 MG PO CAPS: 34.0000 mg | ORAL_CAPSULE | Freq: Every day | ORAL | 6 refills | Status: DC

## 2023-10-12 NOTE — Telephone Encounter (Signed)
 Refill appropriate and sent

## 2023-10-12 NOTE — Telephone Encounter (Signed)
 Pt is requesting a refill for Pimavanserin Tartrate  (NUPLAZID ) 34 MG CAPS.  Pharmacy: [facility]

## 2023-10-29 DIAGNOSIS — R809 Proteinuria, unspecified: Secondary | ICD-10-CM | POA: Diagnosis not present

## 2023-10-29 DIAGNOSIS — I129 Hypertensive chronic kidney disease with stage 1 through stage 4 chronic kidney disease, or unspecified chronic kidney disease: Secondary | ICD-10-CM | POA: Diagnosis not present

## 2023-10-29 DIAGNOSIS — E559 Vitamin D deficiency, unspecified: Secondary | ICD-10-CM | POA: Diagnosis not present

## 2023-10-29 DIAGNOSIS — N182 Chronic kidney disease, stage 2 (mild): Secondary | ICD-10-CM | POA: Diagnosis not present

## 2023-11-10 ENCOUNTER — Encounter: Payer: Self-pay | Admitting: Neurology

## 2023-11-10 ENCOUNTER — Ambulatory Visit (INDEPENDENT_AMBULATORY_CARE_PROVIDER_SITE_OTHER): Payer: 59 | Admitting: Neurology

## 2023-11-10 VITALS — BP 116/64 | Ht 63.0 in | Wt 124.0 lb

## 2023-11-10 DIAGNOSIS — Z87898 Personal history of other specified conditions: Secondary | ICD-10-CM | POA: Diagnosis not present

## 2023-11-10 DIAGNOSIS — G20C Parkinsonism, unspecified: Secondary | ICD-10-CM
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
## 2023-11-10 NOTE — Patient Instructions (Addendum)
 Continue with Sinemet  25/100 3 times daily Continue your other medications Please follow-up with psychiatry for an alternative option to Abilify Return in 1 year or sooner if worse.

## 2023-11-15 DIAGNOSIS — R634 Abnormal weight loss: Secondary | ICD-10-CM | POA: Diagnosis not present

## 2023-11-15 DIAGNOSIS — I10 Essential (primary) hypertension: Secondary | ICD-10-CM | POA: Diagnosis not present

## 2023-11-15 DIAGNOSIS — Z79899 Other long term (current) drug therapy: Secondary | ICD-10-CM | POA: Diagnosis not present

## 2023-11-23 ENCOUNTER — Encounter: Payer: Self-pay | Admitting: Gastroenterology

## 2023-12-15 ENCOUNTER — Ambulatory Visit (INDEPENDENT_AMBULATORY_CARE_PROVIDER_SITE_OTHER): Admitting: Gastroenterology

## 2023-12-15 ENCOUNTER — Encounter: Payer: Self-pay | Admitting: Gastroenterology

## 2023-12-15 VITALS — BP 132/75 | HR 62 | Temp 97.3°F | Ht 68.0 in | Wt 126.2 lb

## 2023-12-15 DIAGNOSIS — A09 Infectious gastroenteritis and colitis, unspecified: Secondary | ICD-10-CM | POA: Diagnosis not present

## 2023-12-15 DIAGNOSIS — R222 Localized swelling, mass and lump, trunk: Secondary | ICD-10-CM | POA: Diagnosis not present

## 2023-12-15 DIAGNOSIS — R197 Diarrhea, unspecified: Secondary | ICD-10-CM | POA: Insufficient documentation

## 2023-12-15 DIAGNOSIS — K529 Noninfective gastroenteritis and colitis, unspecified: Secondary | ICD-10-CM

## 2023-12-15 DIAGNOSIS — R634 Abnormal weight loss: Secondary | ICD-10-CM

## 2023-12-15 NOTE — Progress Notes (Signed)
 GI Office Note    Referring Provider: Teresa Jenkins Jansky, FNP Primary Care Physician:  Teresa Jenkins Jansky, FNP  Primary Gastroenterologist: Ozell Hollingshead, MD   Chief Complaint   Chief Complaint  Patient presents with   Weight Loss   Diarrhea     History of Present Illness   Michael Mercado is a 83 y.o. male presenting today at the request of Therisa Jansky Teresa, NP for 15 pound with loss in past six months with unkonwn cause and no change in appetite, abdominal distention worsening.  Presents with caregiver today. He has been resident of their homes for over 20 years. Patient has legal guarding with DSS, Melissa Price.   Discussed the use of AI scribe software for clinical note transcription with the patient, who gave verbal consent to proceed.   He has been experiencing significant weight loss despite maintaining a good appetite and eating frequently. His caregiver notes that he eats and drinks constantly, yet continues to lose weight, which is concerning given his consistent intake. He has lost about 12 pounds in the past two months.   He has been having frequent loose stools for several months, with bowel movements occurring 4-5 times a day. The stools are described as loose and splattering, with no associated constipation. There is no history of blood in the stool, and he has not experienced any vomiting. Patient denies any abdominal pain. There has been no n/v. No recent medication changes.   His caregiver is unaware of any prior colonoscopy. He states patient does not have any family around. He has legal guardian.  Really unaware of family history.     Prior Data   10/2023: PSA 0.2, WBC 6.6, Hgb 13.7, Plt 206, bun 27, cre 1.33H, glu 122H, tbili 0.6, ap 70, ast 18, alt 8,   Medications   Current Outpatient Medications  Medication Sig Dispense Refill   amLODipine  (NORVASC ) 10 MG tablet Take 10 mg by mouth daily.     BREO ELLIPTA  100-25 MCG/INH AEPB INHALE 1 PUFF ONCE  DAILY. 60 each 11   carbidopa -levodopa  (SINEMET  IR) 25-100 MG tablet Take 1 tablet by mouth 3 (three) times daily. 90 tablet 11   cetirizine  (ZYRTEC ) 10 MG tablet TAKE (1) TABLET BY MOUTH ONCE DAILY. 1 tablet 11   Cholecalciferol (VITAMIN D-3 PO) Take 2,000 Units by mouth daily.     divalproex (DEPAKOTE) 250 MG DR tablet Take 500 mg by mouth 2 (two) times daily.     FEROSUL 325 (65 Fe) MG tablet Take 325 mg by mouth 2 (two) times daily.     hydrOXYzine (ATARAX) 25 MG tablet Take 25 mg by mouth daily.     lisinopril  (ZESTRIL ) 20 MG tablet TAKE (1) TABLET BY MOUTH ONCE DAILY. 25 tablet 11   memantine (NAMENDA) 5 MG tablet Take 5 mg by mouth daily.     pantoprazole  (PROTONIX ) 40 MG tablet TAKE (1) TABLET BY MOUTH ONCE DAILY. 25 tablet 11   Pimavanserin Tartrate  (NUPLAZID ) 34 MG CAPS Take 1 capsule (34 mg total) by mouth daily. 30 capsule 6   triamcinolone  cream (KENALOG ) 0.1 % APPLY TOPICALLY TO AFFECTED AREA(S) TWICE DAILY. 80 g 4   No current facility-administered medications for this visit.    Allergies   Allergies as of 12/15/2023   (No Known Allergies)    Past Medical History   Past Medical History:  Diagnosis Date   Chronic kidney disease    Hypercholesteremia    Hypertension  Moderate intellectual disability    Mood disorder (HCC)    MR (mental retardation)    Obesity    Parkinsonism [facility])     Past Surgical History   Past Surgical History:  Procedure Laterality Date   CRANIOTOMY Left 07/07/2018   Procedure: CRANIOTOMY HEMATOMA EVACUATION SUBDURAL;  Surgeon: Joshua Alm RAMAN, MD;  [facility] OR;  Service: Neurosurgery;  Laterality: Left;   CRANIOTOMY Left 07/13/2018   Procedure: Redo Left CRANIOTOMY FOR SUBDURAL HEMATOMA;  Surgeon: Joshua Alm RAMAN, MD;  Location: [facility] OR;  Service: Neurosurgery;  Laterality: Left;    Past Family History   Family History  Problem Relation Age of Onset   Stroke Sister     Past Social History   Social History   Socioeconomic  History   Marital status: Single    Spouse name: Not on file   Number of children: Not on file   Years of education: Not on file   Highest education level: Not on file  Occupational History   Occupation: disabled  Tobacco Use   Smoking status: Former    Current packs/day: 0.00    Types: Cigarettes    Quit date: 09/02/2018    Years since quitting: 5.2   Smokeless tobacco: Never  Vaping Use   Vaping status: Never Used  Substance and Sexual Activity   Alcohol use: No    Alcohol/week: 0.0 standard drinks of alcohol   Drug use: No   Sexual activity: Never  Other Topics Concern   Not on file  Social History Narrative   Living in [facility]   Left handed   Caffeine none   Social Drivers of Health   Financial Resource Strain: Low Risk  (07/19/2020)   Overall Financial Resource Strain (CARDIA)    Difficulty of Paying Living Expenses: Not hard at all  Food Insecurity: No Food Insecurity (07/19/2020)   Hunger Vital Sign    Worried About Running Out of Food in the Last Year: Never true    Ran Out of Food in the Last Year: Never true  Transportation Needs: No Transportation Needs (07/19/2020)   PRAPARE - Administrator, Civil Service (Medical): No    Lack of Transportation (Non-Medical): No  Physical Activity: Sufficiently Active (07/19/2020)   Exercise Vital Sign    Days of Exercise per Week: 7 days    Minutes of Exercise per Session: 30 min  Stress: No Stress Concern Present (07/19/2020)   [employer] of Occupational Health - Occupational Stress Questionnaire    Feeling of Stress : Only a little  Social Connections: Moderately Isolated (07/19/2020)   Social Connection and Isolation Panel    Frequency of Communication with Friends and Family: Twice a week    Frequency of Social Gatherings with Friends and Family: Once a week    Attends Religious Services: Never    Database administrator or Organizations: Yes    Attends Engineer, structural: More than 4  times per year    Marital Status: Never married  Intimate Partner Violence: Not At Risk (07/19/2020)   Humiliation, Afraid, Rape, and Kick questionnaire    Fear of Current or Ex-Partner: No    Emotionally Abused: No    Physically Abused: No    Sexually Abused: No    Review of Systems   Patient denies pain. Otherwise ros unobtainable   Physical Exam   BP 132/75 (BP Location: Right Arm, Patient Position: Sitting, Cuff Size: Normal)   Pulse 62  Temp (!) 97.3 F (36.3 C) (Temporal)   Ht 5' 8 (1.727 m)   Wt 126 lb 3.2 oz (57.2 kg)   SpO2 96%   BMI 19.19 kg/m    General: alert, cooperative. Limited communication with patient.   Head: Normocephalic, atraumatic.   Eyes: Conjunctiva pink, no icterus. Mouth: Oropharyngeal mucosa moist and pink  Neck: Supple without thyromegaly, masses, or lymphadenopathy.  Lungs: Clear to auscultation bilaterally.  Heart: Regular rate and rhythm, no murmurs rubs or gallops.  Abdomen: Bowel sounds are normal, nontender, nondistended, no hepatosplenomegaly or masses,  no abdominal bruits or hernia, no rebound or guarding. Dime size cyst like lesion to right of umbilicus.  Rectal: not performed Extremities: No lower extremity edema. No clubbing or deformities.  Neuro: Alert and oriented x 4 , grossly normal neurologically.  Skin: Warm and dry, no rash or jaundice.   Psych: Alert and cooperative, normal mood and affect.  Labs   See above  Imaging Studies   No results found.  Assessment/Plan:         Chronic diarrhea Chronic diarrhea for several months with frequent loose stools, occurring more than five times a day. No associated blood in stools. No constipation or abdominal pain. Ddx includes infectious etiology, malignancy, less likely IBD. - GI profile, Cdiff, Order stool tests to check for infections - ttg iga, iga, Hgb A1C, TSH/free T4, sed rate crp - If stool tests are negative, consider imaging such as CT vs CTA A/P - Discuss  potential need for colonoscopy or endoscopy if initial tests are inconclusive  Unintentional weight loss Unintentional weight loss of approximately 12 pounds over the past few months despite good appetite and constant eating. - labs as outlined - If initial tests are inconclusive, consider imaging such as CT vs CTA A/P         Michael Mercado. Ezzard, MHS, PA-C [facility]

## 2023-12-15 NOTE — Patient Instructions (Signed)
 Please complete labs and stool tests as soon as possible. If results are unremarkable, we will move towards imaging (CT scan of the abdomen and pelvis).

## 2023-12-18 ENCOUNTER — Ambulatory Visit: Payer: Self-pay | Admitting: Gastroenterology

## 2023-12-19 LAB — C-REACTIVE PROTEIN: CRP: 6 mg/L (ref 0–10)

## 2023-12-19 LAB — HEMOGLOBIN A1C
Est. average glucose Bld gHb Est-mCnc: 100 mg/dL
Hgb A1c MFr Bld: 5.1 % (ref 4.8–5.6)

## 2023-12-19 LAB — TSH+FREE T4
Free T4: 1.25 ng/dL (ref 0.82–1.77)
TSH: 4.21 u[IU]/mL (ref 0.450–4.500)

## 2023-12-19 LAB — SEDIMENTATION RATE: Sed Rate: 5 mm/h (ref 0–30)

## 2023-12-19 LAB — IGA: IgA/Immunoglobulin A, Serum: 258 mg/dL (ref 61–437)

## 2023-12-19 LAB — TISSUE TRANSGLUTAMINASE, IGA: Transglutaminase IgA: <2 U/mL (ref 0–3)

## 2024-03-17 ENCOUNTER — Other Ambulatory Visit: Payer: Self-pay | Admitting: Neurology

## 2024-03-21 ENCOUNTER — Other Ambulatory Visit: Payer: Self-pay | Admitting: Neurology

## 2024-03-31 ENCOUNTER — Encounter: Payer: Self-pay | Admitting: Gastroenterology

## 2024-04-10 ENCOUNTER — Encounter: Payer: Self-pay | Admitting: Gastroenterology

## 2024-04-10 ENCOUNTER — Ambulatory Visit (INDEPENDENT_AMBULATORY_CARE_PROVIDER_SITE_OTHER): Admitting: Gastroenterology

## 2024-04-10 VITALS — BP 130/71 | HR 64 | Temp 97.6°F | Ht 68.0 in | Wt 119.2 lb

## 2024-04-10 DIAGNOSIS — R634 Abnormal weight loss: Secondary | ICD-10-CM

## 2024-04-10 DIAGNOSIS — R195 Other fecal abnormalities: Secondary | ICD-10-CM

## 2024-04-10 DIAGNOSIS — K529 Noninfective gastroenteritis and colitis, unspecified: Secondary | ICD-10-CM | POA: Diagnosis not present

## 2024-04-10 NOTE — Progress Notes (Signed)
 "    GI Office Note    Referring Provider: Teresa Jenkins Jansky, FNP Primary Care Physician:  Teresa Jenkins Jansky, FNP  Primary Gastroenterologist: Ozell Hollingshead, MD   Chief Complaint   Chief Complaint  Patient presents with   Follow-up    History of Present Illness   Michael Mercado is a 83 y.o. male presenting today at the request of Jenkins Jansky Teresa, FNP for heme positive stool, consult for a colonoscopy.  Patient was last seen in August 2025 for 15 pound weight loss, abdominal distention.  Patient has a legal guardian with DSS, Melissa Price.  Has been a resident of a group home for over 20 years.  At that time caregiver noted that patient was eating and drinking well, unclear why he was losing weight.  Also with frequent loose stools for several months up to 4-5 times per day.  No blood in the stool that they were aware of.  I have ordered stool studies which were not completed.  Inflammatory labs and celiac screen negative.  We were waiting on stool studies to decide next step based on findings, wanting to rule out infectious etiology initially.  Discussed the use of AI scribe software for clinical note transcription with the patient, who gave verbal consent to proceed.  History of Present Illness Michael Mercado is an 83 year old male with chronic diarrhea who presents for evaluation of positive fecal occult blood and unintentional weight loss.  He was referred after a recent stool test detected occult blood. He presents with caregiver from group home. He is unable to provide reliable history. He never completed stool tests after last ov, per staff, he kept throwing away the stool kits. Unaware of any previous colonoscopy.  He has had frequent diarrhea for several months, including at his last visit in August 2025. Exact bowel movement frequency is unclear, but his caregiver notes frequent bathroom trips. He has no abdominal pain or discomfort.  Despite good reported oral  intake with frequent snacks and eating well, he has ongoing unintentional weight loss. Appetite is intact without decreased intake. The cause of weight loss is unclear to the caregiver.   He has a legal guardian ([employer]).  Wt Readings from Last 8 Encounters:  04/10/24 119 lb 3.2 oz (54.1 kg)  12/15/23 126 lb 3.2 oz (57.2 kg)  11/10/23 124 lb (56.2 kg)  04/07/23 139 lb 8 oz (63.3 kg)  09/16/22 139 lb (63 kg)  03/16/22 147 lb 8 oz (66.9 kg)  06/16/21 145 lb (65.8 kg)  05/05/21 140 lb (63.5 kg)      Prior Data     Results Labs Fecal occult blood (03/2024): Positive for blood Labs from August 2025: TTG IgA less than 2, IgA 258, A1c 5.1, TSH 4.210, sed rate 5, CRP 6.  Wt Readings from Last 10 Encounters:  04/10/24 119 lb 3.2 oz (54.1 kg)  12/15/23 126 lb 3.2 oz (57.2 kg)  11/10/23 124 lb (56.2 kg)  04/07/23 139 lb 8 oz (63.3 kg)  09/16/22 139 lb (63 kg)  03/16/22 147 lb 8 oz (66.9 kg)  06/16/21 145 lb (65.8 kg)  05/05/21 140 lb (63.5 kg)  03/17/21 140 lb (63.5 kg)  12/12/20 136 lb (61.7 kg)      Medications   Current Outpatient Medications  Medication Sig Dispense Refill   amLODipine  (NORVASC ) 10 MG tablet Take 10 mg by mouth daily.     BREO ELLIPTA  100-25 MCG/INH AEPB INHALE 1 PUFF ONCE DAILY. 60  each 11   busPIRone (BUSPAR) 10 MG tablet Take 10 mg by mouth 2 (two) times daily.     carbidopa -levodopa  (SINEMET  IR) 25-100 MG tablet Take 1 tablet by mouth 3 (three) times daily. 90 tablet 11   cetirizine  (ZYRTEC ) 10 MG tablet TAKE (1) TABLET BY MOUTH ONCE DAILY. 1 tablet 11   D3 HIGH POTENCY 50 MCG (2000 UT) CAPS Take 1 capsule by mouth daily.     divalproex (DEPAKOTE) 250 MG DR tablet Take 500 mg by mouth 2 (two) times daily.     FEROSUL 325 (65 Fe) MG tablet Take 325 mg by mouth 2 (two) times daily.     lisinopril  (ZESTRIL ) 20 MG tablet TAKE (1) TABLET BY MOUTH ONCE DAILY. 25 tablet 11   memantine (NAMENDA) 5 MG tablet Take 5 mg by mouth daily.     NUPLAZID  34 MG CAPS  Take 1 capsule by mouth once daily 30 capsule 0   PAIN RELIEF EXTRA STRENGTH 500 MG tablet Take 500 mg by mouth every 4 (four) hours as needed.     pantoprazole  (PROTONIX ) 40 MG tablet TAKE (1) TABLET BY MOUTH ONCE DAILY. 25 tablet 11   sertraline (ZOLOFT) 50 MG tablet Take 50 mg by mouth daily.     triamcinolone  cream (KENALOG ) 0.1 % APPLY TOPICALLY TO AFFECTED AREA(S) TWICE DAILY. 80 g 4   No current facility-administered medications for this visit.    Allergies   Allergies as of 04/10/2024   (No Known Allergies)     Past Medical History   Past Medical History:  Diagnosis Date   Chronic kidney disease    Hypercholesteremia    Hypertension    Moderate intellectual disability    Mood disorder    MR (mental retardation)    Obesity    Parkinsonism (HCC)    Seizure disorder [facility])     Past Surgical History   Past Surgical History:  Procedure Laterality Date   CRANIOTOMY Left 07/07/2018   Procedure: CRANIOTOMY HEMATOMA EVACUATION SUBDURAL;  Surgeon: Joshua Alm RAMAN, MD;  Location: [facility];  Service: Neurosurgery;  Laterality: Left;   CRANIOTOMY Left 07/13/2018   Procedure: Redo Left CRANIOTOMY FOR SUBDURAL HEMATOMA;  Surgeon: Joshua Alm RAMAN, MD;  Location: [facility] OR;  Service: Neurosurgery;  Laterality: Left;    Past Family History   Family History  Problem Relation Age of Onset   Stroke Sister     Past Social History   Social History   Socioeconomic History   Marital status: Single    Spouse name: Not on file   Number of children: Not on file   Years of education: Not on file   Highest education level: Not on file  Occupational History   Occupation: disabled  Tobacco Use   Smoking status: Former    Current packs/day: 0.00    Average packs/day: 0.3 packs/day    Types: Cigarettes    Quit date: 09/02/2018    Years since quitting: 5.6   Smokeless tobacco: Never  Vaping Use   Vaping status: Never Used  Substance and Sexual Activity   Alcohol use: No     Alcohol/week: 0.0 standard drinks of alcohol   Drug use: No   Sexual activity: Never  Other Topics Concern   Not on file  Social History Narrative   Living in [facility]   Left handed   Caffeine none   Social Drivers of Health   Tobacco Use: Medium Risk (04/10/2024)   Patient History  Smoking Tobacco Use: Former    Smokeless Tobacco Use: Never    Passive Exposure: Not on Actuary Strain: Not on file  Food Insecurity: Not on file  Transportation Needs: Not on file  Physical Activity: Not on file  Stress: Not on file  Social Connections: Not on file  Intimate Partner Violence: Not on file  Depression (PHQ2-9): Low Risk (06/16/2021)   Depression (PHQ2-9)    PHQ-2 Score: 3  Alcohol Screen: Not on file  Housing: Not on file  Utilities: Not on file  Health Literacy: Not on file    Review of Systems  Patient unable to provide reliable history    Physical Exam   BP 130/71   Pulse 64   Temp 97.6 F (36.4 C) (Temporal)   Ht 5' 8 (1.727 m)   Wt 119 lb 3.2 oz (54.1 kg)   SpO2 98%   BMI 18.12 kg/m    General: Well-nourished, well-developed in no acute distress. Follows simple commands. Cooperative.  Eyes: No icterus. Mouth: Oropharyngeal mucosa moist and pink   Lungs: Clear to auscultation bilaterally.  Heart: Regular rate and rhythm, no murmurs rubs or gallops.  Abdomen: Bowel sounds are normal, nontender, nondistended, no hepatosplenomegaly or masses,  no abdominal bruits or hernia , no rebound or guarding.  Rectal: not performed Extremities: No lower extremity edema. No clubbing or deformities. Neuro: Alert and oriented x 4   Skin: Warm and dry, no jaundice.   Psych: Alert and cooperative, normal mood and affect.  Labs   None available  Imaging Studies   No results found.  Assessment/Plan:     Assessment & Plan Chronic diarrhea Celiac screen negative, inflammatory labs (CRP, sed rate) normal. Thyroid   function normal. Stool  studies never completed. Ddx includes, malignancy, IBD, dietary, medication side effect. Recent heme positive stool, completed by PCP. - Requested recent lab results from primary care to assess for anemia.  -will likely offer colonoscopy in the near future. Prep may be challenging through group home setting. Discussed clear liquid phase and patient's food intake is monitored and food regulated. He has legal guardian and would need to discussed if decide to move forward with colonoscopy.   Unintentional weight loss Ongoing weight loss despite adequate intake in context of chronic diarrhea, cannot rule out underlying GI pathology including malignancy. - Requested recent lab results from primary care to assess for anemia and abnormalities. - Consider further evaluation with colonoscopy and possibly upper endoscopy if anemia is present or weight loss persists. - If endoscopic evaluation is negative, he may require further imaging to rule out malignancy.   Fecal occult blood Positive fecal occult blood test with chronic diarrhea and weight loss. DDx includes GI malignancy. - Requested recent lab results from primary care to assess for anemia. May require EGD at time of colonoscopy.  - Will discuss colonoscopy with legal guardian once lab data is reviewed.      Sonny RAMAN. Ezzard, MHS, PA-C [facility]  "

## 2024-04-10 NOTE — Patient Instructions (Signed)
 We will request copy of recent labs for review. Once reviewed, we will work towards scheduling colonoscopy.

## 2024-04-20 ENCOUNTER — Encounter: Payer: Self-pay | Admitting: Gastroenterology

## 2024-04-23 ENCOUNTER — Telehealth: Payer: Self-pay | Admitting: Gastroenterology

## 2024-04-23 NOTE — Telephone Encounter (Signed)
 Reviewed labs sent from PCP, done in July.  CKD, no anemia.  He needs colonoscopy and possible EGD due to diarrhea, weight loss, heme positive stool. ASA 3, rm 1,2 ok. He has a legal guardian, please reach out and see if agreeable to colonoscopy and possible EGD. Hold iron 7 days before procedure.

## 2024-04-25 NOTE — Telephone Encounter (Signed)
 Spoke with group home manager and they are ok with moving forward with scheduling procedures.

## 2024-04-25 NOTE — Telephone Encounter (Signed)
 Spoke to [place] at group home and she states that she is currently awaiting a return call from pt's guardian. She will call back to schedule once she speaks to her

## 2024-04-25 NOTE — Telephone Encounter (Signed)
 Spoke to pt's legal guardian Eleanor Domino and she states it's ok to set up procedure. Called and spoke with group home manager and she states she is currently on the road taking pt to a doctors appointment. Will call back later to schedule.

## 2024-04-25 NOTE — Telephone Encounter (Signed)
 Thanks

## 2024-04-27 ENCOUNTER — Other Ambulatory Visit: Payer: Self-pay | Admitting: Neurology

## 2024-05-01 ENCOUNTER — Other Ambulatory Visit: Payer: Self-pay | Admitting: *Deleted

## 2024-05-01 ENCOUNTER — Encounter: Payer: Self-pay | Admitting: *Deleted

## 2024-05-01 MED ORDER — PEG 3350-KCL-NA BICARB-NACL 420 G PO SOLR: 4000.0000 mL | Freq: Once | ORAL | 0 refills | Status: AC

## 2024-05-01 NOTE — Telephone Encounter (Signed)
 Pt has been scheduled for 05/10/24. Instructions sent to pt and prep sent to pharmacy.

## 2024-05-01 NOTE — Telephone Encounter (Signed)
 [facility] PA: Your authorization [phone] [id number] was submitted

## 2024-05-04 NOTE — Telephone Encounter (Signed)
 [facility] PA: Request was received for surgery services code 56764 x 1 unit , 45378 x 1 unit for dates of service 05/10/2024 to 08/09/2024. Current clinical coverage policies do not require prior authorization for this service.  Request was received for surgery services code 56764 x 1 unit , 45378 x 1 unit for dates of service 05/10/2024 to 08/09/2024. Current clinical coverage policies do not require prior authorization for this service

## 2024-05-05 ENCOUNTER — Encounter (HOSPITAL_COMMUNITY)
Admission: RE | Admit: 2024-05-05 | Discharge: 2024-05-05 | Disposition: A | Discharge status: Home / Self Care | Source: Ambulatory Visit | Attending: Internal Medicine | Admitting: Internal Medicine

## 2024-05-05 ENCOUNTER — Encounter (HOSPITAL_COMMUNITY): Payer: Self-pay

## 2024-05-05 ENCOUNTER — Other Ambulatory Visit: Payer: Self-pay

## 2024-05-05 NOTE — Pre-Procedure Instructions (Signed)
 Spoke with Angela Wilson-caregiver at [facility] for pre-op phone call. She states patient ambulates but needs to hold to someone He does not need a list but will need help dressing and undressing. I left message for Eleanor Domino, [employer], legal guardian,([phone]) to see if she will be coming with patient that day or if we need to get telephone consent.

## 2024-05-09 ENCOUNTER — Telehealth: Payer: Self-pay | Admitting: *Deleted

## 2024-05-09 NOTE — Progress Notes (Signed)
 Patient's legal guardian called wants to cancelled procedure for tomorrow. I instructed them to call Dr.Rourk's office to let them know. Message sent to dr's office.

## 2024-05-09 NOTE — Telephone Encounter (Signed)
 Pt guardian called at 4:40pm to cancel procedure for tomorrow. She is not able to bring him and she would call to reschedule once she figures out staffing to bring him. . Message sent to endo

## 2024-05-10 ENCOUNTER — Ambulatory Visit (HOSPITAL_COMMUNITY): Admission: RE | Admit: 2024-05-10 | Admitting: Internal Medicine

## 2024-05-10 ENCOUNTER — Encounter (HOSPITAL_COMMUNITY): Admission: RE | Payer: Self-pay

## 2024-05-10 SURGERY — COLONOSCOPY
Anesthesia: Choice

## 2024-05-26 ENCOUNTER — Other Ambulatory Visit: Payer: Self-pay | Admitting: Neurology

## 2024-11-09 ENCOUNTER — Ambulatory Visit: Admitting: Neurology
